# Patient Record
Sex: Male | Born: 1945
Health system: Southern US, Community
[De-identification: ages and names within clinical notes are randomized; demographics above are authoritative.]

## PROBLEM LIST (undated history)

## (undated) DIAGNOSIS — I219 Acute myocardial infarction, unspecified: Secondary | ICD-10-CM

## (undated) DIAGNOSIS — G629 Polyneuropathy, unspecified: Secondary | ICD-10-CM

## (undated) DIAGNOSIS — I10 Essential (primary) hypertension: Secondary | ICD-10-CM

## (undated) DIAGNOSIS — L0390 Cellulitis, unspecified: Secondary | ICD-10-CM

## (undated) DIAGNOSIS — M545 Low back pain, unspecified: Secondary | ICD-10-CM

## (undated) DIAGNOSIS — I2510 Atherosclerotic heart disease of native coronary artery without angina pectoris: Secondary | ICD-10-CM

## (undated) DIAGNOSIS — K219 Gastro-esophageal reflux disease without esophagitis: Secondary | ICD-10-CM

## (undated) DIAGNOSIS — G4733 Obstructive sleep apnea (adult) (pediatric): Secondary | ICD-10-CM

## (undated) DIAGNOSIS — N200 Calculus of kidney: Secondary | ICD-10-CM

## (undated) DIAGNOSIS — F4310 Post-traumatic stress disorder, unspecified: Secondary | ICD-10-CM

## (undated) DIAGNOSIS — M1990 Unspecified osteoarthritis, unspecified site: Secondary | ICD-10-CM

## (undated) DIAGNOSIS — R0600 Dyspnea, unspecified: Secondary | ICD-10-CM

## (undated) DIAGNOSIS — M109 Gout, unspecified: Secondary | ICD-10-CM

## (undated) DIAGNOSIS — Z95810 Presence of automatic (implantable) cardiac defibrillator: Secondary | ICD-10-CM

## (undated) DIAGNOSIS — T8859XA Other complications of anesthesia, initial encounter: Secondary | ICD-10-CM

## (undated) DIAGNOSIS — I509 Heart failure, unspecified: Secondary | ICD-10-CM

## (undated) DIAGNOSIS — C801 Malignant (primary) neoplasm, unspecified: Secondary | ICD-10-CM

## (undated) DIAGNOSIS — M7989 Other specified soft tissue disorders: Secondary | ICD-10-CM

## (undated) DIAGNOSIS — E669 Obesity, unspecified: Secondary | ICD-10-CM

## (undated) HISTORY — PX: PACEMAKER PLACEMENT: SHX43

## (undated) HISTORY — DX: Low back pain: M54.5

## (undated) HISTORY — PX: MULTIPLE TOOTH EXTRACTIONS: SHX2053

## (undated) HISTORY — DX: Calculus of kidney: N20.0

## (undated) HISTORY — DX: Obesity, unspecified: E66.9

## (undated) HISTORY — DX: Polyneuropathy, unspecified: G62.9

## (undated) HISTORY — DX: Heart failure, unspecified: I50.9

## (undated) HISTORY — DX: Low back pain, unspecified: M54.50

## (undated) HISTORY — DX: Malignant (primary) neoplasm, unspecified: C80.1

## (undated) HISTORY — DX: Other specified soft tissue disorders: M79.89

## (undated) HISTORY — DX: Acute myocardial infarction, unspecified: I21.9

## (undated) HISTORY — PX: CARDIAC DEFIBRILLATOR PLACEMENT: SHX171

## (undated) HISTORY — PX: PACEMAKER INSERTION: SHX728

## (undated) HISTORY — DX: Essential (primary) hypertension: I10

---

## 1993-02-16 HISTORY — PX: CORONARY ANGIOPLASTY WITH STENT PLACEMENT: SHX49

## 1993-12-09 DIAGNOSIS — I219 Acute myocardial infarction, unspecified: Secondary | ICD-10-CM

## 1993-12-09 HISTORY — DX: Acute myocardial infarction, unspecified: I21.9

## 2002-02-16 HISTORY — PX: JOINT REPLACEMENT: SHX530

## 2008-05-09 ENCOUNTER — Ambulatory Visit: Payer: Self-pay | Admitting: Diagnostic Radiology

## 2008-05-09 ENCOUNTER — Emergency Department (HOSPITAL_BASED_OUTPATIENT_CLINIC_OR_DEPARTMENT_OTHER): Admission: EM | Admit: 2008-05-09 | Discharge: 2008-05-09 | Payer: Self-pay | Admitting: Emergency Medicine

## 2010-05-29 LAB — COMPREHENSIVE METABOLIC PANEL
ALT: 6 U/L (ref 0–53)
AST: 23 U/L (ref 0–37)
Albumin: 3.8 g/dL (ref 3.5–5.2)
Alkaline Phosphatase: 74 U/L (ref 39–117)
BUN: 7 mg/dL (ref 6–23)
CO2: 30 mEq/L (ref 19–32)
Calcium: 9.2 mg/dL (ref 8.4–10.5)
Chloride: 99 mEq/L (ref 96–112)
Creatinine, Ser: 1.2 mg/dL (ref 0.4–1.5)
GFR calc Af Amer: >60 mL/min (ref >60)
GFR calc non Af Amer: >60 mL/min (ref >60)
Glucose, Bld: 53 mg/dL — ABNORMAL LOW (ref 70–99)
Potassium: 3 mEq/L — ABNORMAL LOW (ref 3.5–5.1)
Sodium: 141 mEq/L (ref 135–145)
Total Bilirubin: 0.7 mg/dL (ref 0.3–1.2)
Total Protein: 7.8 g/dL (ref 6.0–8.3)

## 2010-05-29 LAB — POCT CARDIAC MARKERS
CKMB, poc: 1.3 ng/mL (ref 1.0–8.0)
Myoglobin, poc: 162 ng/mL (ref 12–200)
Troponin i, poc: <0.05 ng/mL (ref 0.00–0.09)

## 2010-05-29 LAB — DIFFERENTIAL
Basophils Absolute: 0.2 10*3/uL — ABNORMAL HIGH (ref 0.0–0.1)
Basophils Relative: 3 % — ABNORMAL HIGH (ref 0–1)
Eosinophils Absolute: 0.1 10*3/uL (ref 0.0–0.7)
Eosinophils Relative: 1 % (ref 0–5)
Lymphocytes Relative: 23 % (ref 12–46)
Lymphs Abs: 1.4 10*3/uL (ref 0.7–4.0)
Monocytes Absolute: 0.7 10*3/uL (ref 0.1–1.0)
Monocytes Relative: 11 % (ref 3–12)
Neutro Abs: 3.7 10*3/uL (ref 1.7–7.7)
Neutrophils Relative %: 61 % (ref 43–77)

## 2010-05-29 LAB — URINALYSIS, ROUTINE W REFLEX MICROSCOPIC
Bilirubin Urine: NEGATIVE
Glucose, UA: NEGATIVE mg/dL
Hgb urine dipstick: NEGATIVE
Ketones, ur: NEGATIVE mg/dL
Nitrite: POSITIVE — AB
Protein, ur: NEGATIVE mg/dL
Specific Gravity, Urine: 1.014 (ref 1.005–1.030)
Urobilinogen, UA: 1 mg/dL (ref 0.0–1.0)
pH: 6.5 (ref 5.0–8.0)

## 2010-05-29 LAB — LIPASE, BLOOD: Lipase: 23 U/L (ref 23–300)

## 2010-05-29 LAB — GLUCOSE, CAPILLARY: Glucose-Capillary: 87 mg/dL (ref 70–99)

## 2010-05-29 LAB — CBC
HCT: 38.2 % — ABNORMAL LOW (ref 39.0–52.0)
Hemoglobin: 12.3 g/dL — ABNORMAL LOW (ref 13.0–17.0)
MCHC: 32.3 g/dL (ref 30.0–36.0)
MCV: 91.7 fL (ref 78.0–100.0)
Platelets: 207 10*3/uL (ref 150–400)
RBC: 4.16 MIL/uL — ABNORMAL LOW (ref 4.22–5.81)
RDW: 15.3 % (ref 11.5–15.5)
WBC: 6.1 10*3/uL (ref 4.0–10.5)

## 2010-05-29 LAB — URINE MICROSCOPIC-ADD ON

## 2010-05-29 LAB — POCT B-TYPE NATRIURETIC PEPTIDE (BNP): B Natriuretic Peptide, POC: 246 pg/mL — ABNORMAL HIGH (ref 0–100)

## 2010-09-16 ENCOUNTER — Encounter (INDEPENDENT_AMBULATORY_CARE_PROVIDER_SITE_OTHER): Payer: Medicare Other

## 2010-09-16 DIAGNOSIS — M79609 Pain in unspecified limb: Secondary | ICD-10-CM

## 2010-10-02 ENCOUNTER — Encounter: Payer: Self-pay | Admitting: Vascular Surgery

## 2010-10-17 ENCOUNTER — Encounter: Payer: Self-pay | Admitting: Vascular Surgery

## 2010-10-24 ENCOUNTER — Ambulatory Visit (INDEPENDENT_AMBULATORY_CARE_PROVIDER_SITE_OTHER): Payer: Medicare Other | Admitting: Vascular Surgery

## 2010-10-24 ENCOUNTER — Encounter: Payer: Self-pay | Admitting: Vascular Surgery

## 2010-10-24 VITALS — BP 109/62 | HR 72 | Ht 68.0 in | Wt 270.0 lb

## 2010-10-24 DIAGNOSIS — E1159 Type 2 diabetes mellitus with other circulatory complications: Secondary | ICD-10-CM

## 2010-10-24 DIAGNOSIS — G629 Polyneuropathy, unspecified: Secondary | ICD-10-CM

## 2010-10-24 DIAGNOSIS — G609 Hereditary and idiopathic neuropathy, unspecified: Secondary | ICD-10-CM

## 2010-10-24 DIAGNOSIS — M79609 Pain in unspecified limb: Secondary | ICD-10-CM

## 2010-10-24 NOTE — Progress Notes (Signed)
[facility]  Referred by: Dr. Norlene Campbell  Reason for referral: Bilateral leg pain  History of Present Illness  Johnny Ochoa is a 65 y.o. male who presents with chief complaint: bilateral leg pain.  Onset of symptom occurred years ago.  Pain is described as aching and cramping in thigh and calves, severity 3-10/10, and associated with movement.  Pain often starts in back/hip and goes down leg.  Patient does ambulate much due to his pain and degeneration in his left knee.   Patient has attempted to treat this pain with chronic pain medications including methadone, vicodin and oxycodone.  The patient has no rest pain symptoms and denies  leg wounds/ulcers.  Atherosclerotic risk factors include: DM, hyperlipidemia, HTN.  Past Medical History  Diagnosis Date  . Kidney stones   . Leg swelling   . Low back pain   . Diabetes mellitus   . Peripheral neuropathy   . Obese   . Hypertension   . Myocardial infarction 12/09/1993  . CHF (congestive heart failure)   . Cancer     prostate  (Radiation only)    Past Surgical History  Procedure Date  . Pacemaker placement   . Joint replacement 2004    right total knee replacement  . Coronary angioplasty with stent placement 1995     x 3 stents  . Multiple tooth extractions     Total Tooth extraction    History   Social History  . Marital Status: Married    Spouse Name: N/A    Number of Children: N/A  . Years of Education: N/A   Occupational History  . Not on file.   Social History Main Topics  . Smoking status: Former Smoker    Types: Cigarettes    Quit date: 10/23/1980  . Smokeless tobacco: Not on file  . Alcohol Use: No  . Drug Use: No  . Sexually Active:    Other Topics Concern  . Not on file   Social History Narrative  . No narrative on file    Family History  Problem Relation Age of Onset  . Diabetes Other   . Hypertension Other   . Heart disease Other   . Arthritis Other      Current outpatient prescriptions:ALBUTEROL IN, Inhale 90 mcg into the lungs as needed. , Disp: , Rfl: ;  citalopram (CELEXA) 10 MG tablet, Take 20 mg by mouth daily. , Disp: , Rfl: ;  Furosemide (LASIX PO), Take 40 mg by mouth daily. , Disp: , Rfl: ;  GABAPENTIN PO, Take 300 mg by mouth 3 (three) times daily. , Disp: , Rfl: ;  HYDROCODONE-ACETAMINOPHEN PO, Take by mouth. 5/500 mg     Take one tablet four times a day as needed for pain, Disp: , Rfl:  insulin NPH-insulin regular (NOVOLIN 70/30) (70-30) 100 UNIT/ML injection, Inject into the skin.  , Disp: , Rfl: ;  LISINOPRIL PO, Take 20 mg by mouth. One half tablet two times a day, Disp: , Rfl: ;  Menthol-Methyl Salicylate 1-15 % CREA, Apply topically.  , Disp: , Rfl: ;  METHADONE HCL PO, Take 10 mg by mouth 4 (four) times daily. , Disp: , Rfl: ;  METOPROLOL TARTRATE PO, Take 25 mg by mouth 2 (two) times daily. , Disp: , Rfl:  Multiple Vitamin (MULTIVITAMIN) tablet, Take 1 tablet by mouth daily.  , Disp: , Rfl: ;  nitroGLYCERIN (NITRODUR - DOSED IN MG/24 HR) 0.2 mg/hr, Place 1 patch onto  the skin daily. Use in conjunction with 0.6 mg/hr patch , Disp: , Rfl: ;  nitroGLYCERIN (NITRODUR - DOSED IN MG/24 HR) 0.6 mg/hr, Place 1 patch onto the skin daily.  , Disp: , Rfl:  nitroGLYCERIN (NITROSTAT) 0.4 MG SL tablet, Place 0.4 mg under the tongue every 5 (five) minutes as needed.  , Disp: , Rfl: ;  OMEPRAZOLE PO, Take 20 mg by mouth 2 (two) times daily. , Disp: , Rfl: ;  OXYBUTYNIN CHLORIDE PO, Take 5 mg by mouth 2 (two) times daily. , Disp: , Rfl: ;  OXYCODONE HCL PO, Take 5 mg by mouth 2 (two) times daily as needed. , Disp: , Rfl: ;  SIMVASTATIN PO, Take 40 mg by mouth at bedtime. , Disp: , Rfl:  terazosin (HYTRIN) 2 MG capsule, Take 2 mg by mouth. Take 2  At bedtime , Disp: , Rfl: ;  TRAZODONE HCL PO, Take by mouth.  , Disp: , Rfl:   Allergies as of 10/24/2010  . (No Known Allergies)    Review of Systems (Positive items in bold and italic, otherwise  negative)  General: Weight loss, Weight gain, Loss of appetite, Fever  Neurologic: Dizziness, Blackouts, Headaches, Seizure  Ear/Nose/Throat: Change in eyesight, Change in hearing, Nose bleeds, Sore throat  Vascular: Pain in legs with walking, Pain in feet while lying flat, Non-healing ulcer, Stroke, "Mini stroke", Slurred speech, Temporary blindness, Blood clot in vein, Phlebitis  Pulmonary: Home oxygen, Productive cough, Bronchitis, Coughing up blood, Asthma, Wheezing  Musculoskeletal: Arthritis, Joint pain, Muscle pain  Cardiac: Chest pain, Chest tightness/pressure, Shortness of breath when lying flat, Shortness of breath with exertion, Palpitations, Heart murmur, Arrythmia, Atrial fibrillation  Hematologic: Bleeding problems, Clotting disorder, Anemia  Psychiatric:  Depression, Anxiety, Attention deficit disorder  Gastrointestinal:  Black stool, Blood in stool, Peptic ulcer disease, Reflux, Hiatal hernia, Trouble swallowing, Diarrhea, Constipation  Urinary:  Kidney disease, Burning with urination, Frequent urination, Difficulty urinating  Skin: Ulcers, Rashes  Physical Examination  Filed Vitals:   10/24/10 1313  BP: 109/62  Pulse: 72    General: A&O x 3, WDWN, morbidly obese  Head: [place]/AT  Ear/Nose/Throat: Hearing grossly intact, nares w/o erythema or drainage, oropharynx w/o Erythema/Exudate  Eyes: PERRLA, EOMI  Neck: Supple, no nuchal rigidity, no palpable LAD  Pulmonary: Sym exp, good air movt, CTAB, no rales, rhonchi, & wheezing  Cardiac: RRR, Nl S1, S2, no Murmurs, rubs or gallops  Vascular: Vessel Right Left  Radial Palpable Palpable  Brachial Palpable Palpable  Carotid Palpable, without bruit Palpable, without bruit  Aorta Non-palpable (morbidly obese) N/A  Femoral Palpable Palpable  Popliteal Non-palpable Non-palpable  PT Palpable Palpable  DP Palpable Palpable   Gastrointestinal: soft, NTND, -G/R, - HSM, - masses, - CVAT B, morbidly  obese  Musculoskeletal: M/S 5/5 throughout , Extremities without ischemic changes   Neurologic: CN 2-12 intact , Pain and light touch intact in extremities , Motor exam as listed above  Psychiatric: Judgment intact, Mood & affect appropriatefor pt's clinical situation  Dermatologic: See M/S exam for extremity exam, no rashes otherwise noted  Lymph : No Cervical, Axillary, or Inguinal lymphadenopathy   Non-Invasive Vascular Imaging  ABI (Date: 09/18/10)  RLE: 1.19, DP & PT: triphasic  LLE: 1.10, DP & PT: triphasic  Outside Studies/Documentation 10 pages of outside documents were reviewed including: clinic notes.  Medical Decision Making  Johnny Ochoa is a 65 y.o. male who presents with: BLE pain unlikely due to PAD from the normal ABI and palpable pedal  pulses  Given this patient's multiple co-morbidities, I did discuss with him he is at high risk for development of PAD though currently, he does not have clinical evident PAD  I discussed in depth with the patient the nature of atherosclerosis, and emphasized the importance of maximal medical management including strict control of blood pressure, blood glucose, and lipid levels, obtaining regular exercise, and cessation of smoking.  The patient is aware that without maximal medical management the underlying atherosclerotic disease process will progress, limiting the benefit of any interventions.  Thank you for allowing [place] to participate in this patient's care.  He will follow up with [place] as needed.  Leonides Sake, MD [facility] Office: [phone] Pager: [phone]

## 2010-12-05 ENCOUNTER — Other Ambulatory Visit: Payer: Self-pay | Admitting: Orthopaedic Surgery

## 2010-12-05 DIAGNOSIS — M479 Spondylosis, unspecified: Secondary | ICD-10-CM

## 2010-12-09 ENCOUNTER — Ambulatory Visit
Admission: RE | Admit: 2010-12-09 | Discharge: 2010-12-09 | Disposition: A | Discharge status: Home / Self Care | Payer: Medicare Other | Source: Ambulatory Visit | Attending: Orthopaedic Surgery | Admitting: Orthopaedic Surgery

## 2010-12-09 DIAGNOSIS — M479 Spondylosis, unspecified: Secondary | ICD-10-CM

## 2011-02-19 DIAGNOSIS — M48061 Spinal stenosis, lumbar region without neurogenic claudication: Secondary | ICD-10-CM | POA: Diagnosis not present

## 2011-02-19 DIAGNOSIS — M47817 Spondylosis without myelopathy or radiculopathy, lumbosacral region: Secondary | ICD-10-CM | POA: Diagnosis not present

## 2011-02-19 DIAGNOSIS — IMO0002 Reserved for concepts with insufficient information to code with codable children: Secondary | ICD-10-CM | POA: Diagnosis not present

## 2011-02-24 DIAGNOSIS — Z95810 Presence of automatic (implantable) cardiac defibrillator: Secondary | ICD-10-CM | POA: Diagnosis not present

## 2011-03-24 DIAGNOSIS — G4733 Obstructive sleep apnea (adult) (pediatric): Secondary | ICD-10-CM | POA: Diagnosis not present

## 2011-03-24 DIAGNOSIS — I2510 Atherosclerotic heart disease of native coronary artery without angina pectoris: Secondary | ICD-10-CM | POA: Diagnosis not present

## 2011-03-24 DIAGNOSIS — I2589 Other forms of chronic ischemic heart disease: Secondary | ICD-10-CM | POA: Diagnosis not present

## 2011-03-24 DIAGNOSIS — R079 Chest pain, unspecified: Secondary | ICD-10-CM | POA: Diagnosis not present

## 2011-06-12 DIAGNOSIS — I498 Other specified cardiac arrhythmias: Secondary | ICD-10-CM | POA: Diagnosis not present

## 2011-09-15 DIAGNOSIS — I498 Other specified cardiac arrhythmias: Secondary | ICD-10-CM | POA: Diagnosis not present

## 2011-12-17 DIAGNOSIS — I498 Other specified cardiac arrhythmias: Secondary | ICD-10-CM | POA: Diagnosis not present

## 2012-01-05 DIAGNOSIS — E119 Type 2 diabetes mellitus without complications: Secondary | ICD-10-CM | POA: Diagnosis not present

## 2012-01-05 DIAGNOSIS — I2510 Atherosclerotic heart disease of native coronary artery without angina pectoris: Secondary | ICD-10-CM | POA: Diagnosis not present

## 2012-01-05 DIAGNOSIS — G4733 Obstructive sleep apnea (adult) (pediatric): Secondary | ICD-10-CM | POA: Diagnosis not present

## 2012-01-05 DIAGNOSIS — R079 Chest pain, unspecified: Secondary | ICD-10-CM | POA: Diagnosis not present

## 2012-04-26 DIAGNOSIS — I2510 Atherosclerotic heart disease of native coronary artery without angina pectoris: Secondary | ICD-10-CM | POA: Diagnosis not present

## 2012-04-26 DIAGNOSIS — R61 Generalized hyperhidrosis: Secondary | ICD-10-CM | POA: Diagnosis not present

## 2012-04-26 DIAGNOSIS — Z95810 Presence of automatic (implantable) cardiac defibrillator: Secondary | ICD-10-CM | POA: Diagnosis not present

## 2012-04-26 DIAGNOSIS — M7710 Lateral epicondylitis, unspecified elbow: Secondary | ICD-10-CM | POA: Diagnosis not present

## 2012-06-02 DIAGNOSIS — I428 Other cardiomyopathies: Secondary | ICD-10-CM | POA: Diagnosis not present

## 2012-06-02 DIAGNOSIS — R42 Dizziness and giddiness: Secondary | ICD-10-CM | POA: Diagnosis not present

## 2012-06-02 DIAGNOSIS — Z95810 Presence of automatic (implantable) cardiac defibrillator: Secondary | ICD-10-CM | POA: Diagnosis not present

## 2012-06-02 DIAGNOSIS — R61 Generalized hyperhidrosis: Secondary | ICD-10-CM | POA: Diagnosis not present

## 2012-06-02 DIAGNOSIS — Z794 Long term (current) use of insulin: Secondary | ICD-10-CM | POA: Diagnosis not present

## 2012-06-02 DIAGNOSIS — E119 Type 2 diabetes mellitus without complications: Secondary | ICD-10-CM | POA: Diagnosis not present

## 2012-07-28 DIAGNOSIS — I499 Cardiac arrhythmia, unspecified: Secondary | ICD-10-CM | POA: Diagnosis not present

## 2012-11-16 DIAGNOSIS — Z950 Presence of cardiac pacemaker: Secondary | ICD-10-CM | POA: Diagnosis not present

## 2012-11-16 DIAGNOSIS — I428 Other cardiomyopathies: Secondary | ICD-10-CM | POA: Diagnosis not present

## 2013-02-21 DIAGNOSIS — I498 Other specified cardiac arrhythmias: Secondary | ICD-10-CM | POA: Diagnosis not present

## 2013-03-10 DIAGNOSIS — G4733 Obstructive sleep apnea (adult) (pediatric): Secondary | ICD-10-CM | POA: Diagnosis not present

## 2013-03-10 DIAGNOSIS — Z95810 Presence of automatic (implantable) cardiac defibrillator: Secondary | ICD-10-CM | POA: Diagnosis not present

## 2013-03-10 DIAGNOSIS — I2510 Atherosclerotic heart disease of native coronary artery without angina pectoris: Secondary | ICD-10-CM | POA: Diagnosis not present

## 2013-03-10 DIAGNOSIS — R0609 Other forms of dyspnea: Secondary | ICD-10-CM | POA: Diagnosis not present

## 2013-03-10 DIAGNOSIS — I10 Essential (primary) hypertension: Secondary | ICD-10-CM | POA: Diagnosis not present

## 2013-03-10 DIAGNOSIS — R0989 Other specified symptoms and signs involving the circulatory and respiratory systems: Secondary | ICD-10-CM | POA: Diagnosis not present

## 2013-03-10 DIAGNOSIS — I428 Other cardiomyopathies: Secondary | ICD-10-CM | POA: Diagnosis not present

## 2013-03-10 DIAGNOSIS — R079 Chest pain, unspecified: Secondary | ICD-10-CM | POA: Diagnosis not present

## 2013-05-24 DIAGNOSIS — Z95810 Presence of automatic (implantable) cardiac defibrillator: Secondary | ICD-10-CM | POA: Diagnosis not present

## 2013-05-24 DIAGNOSIS — R55 Syncope and collapse: Secondary | ICD-10-CM | POA: Diagnosis not present

## 2013-05-24 DIAGNOSIS — I428 Other cardiomyopathies: Secondary | ICD-10-CM | POA: Diagnosis not present

## 2013-08-24 ENCOUNTER — Emergency Department (HOSPITAL_BASED_OUTPATIENT_CLINIC_OR_DEPARTMENT_OTHER): Payer: Medicare Other

## 2013-08-24 ENCOUNTER — Encounter (HOSPITAL_BASED_OUTPATIENT_CLINIC_OR_DEPARTMENT_OTHER): Payer: Self-pay | Admitting: Emergency Medicine

## 2013-08-24 ENCOUNTER — Emergency Department (HOSPITAL_BASED_OUTPATIENT_CLINIC_OR_DEPARTMENT_OTHER)
Admission: EM | Admit: 2013-08-24 | Discharge: 2013-08-24 | Disposition: A | Discharge status: Home / Self Care | Payer: Medicare Other | Attending: Emergency Medicine | Admitting: Emergency Medicine

## 2013-08-24 DIAGNOSIS — Z87891 Personal history of nicotine dependence: Secondary | ICD-10-CM | POA: Insufficient documentation

## 2013-08-24 DIAGNOSIS — J209 Acute bronchitis, unspecified: Secondary | ICD-10-CM | POA: Insufficient documentation

## 2013-08-24 DIAGNOSIS — J029 Acute pharyngitis, unspecified: Secondary | ICD-10-CM | POA: Diagnosis not present

## 2013-08-24 DIAGNOSIS — Z792 Long term (current) use of antibiotics: Secondary | ICD-10-CM | POA: Insufficient documentation

## 2013-08-24 DIAGNOSIS — Z87442 Personal history of urinary calculi: Secondary | ICD-10-CM | POA: Diagnosis not present

## 2013-08-24 DIAGNOSIS — I509 Heart failure, unspecified: Secondary | ICD-10-CM | POA: Insufficient documentation

## 2013-08-24 DIAGNOSIS — I252 Old myocardial infarction: Secondary | ICD-10-CM | POA: Diagnosis not present

## 2013-08-24 DIAGNOSIS — Z794 Long term (current) use of insulin: Secondary | ICD-10-CM | POA: Insufficient documentation

## 2013-08-24 DIAGNOSIS — I498 Other specified cardiac arrhythmias: Secondary | ICD-10-CM | POA: Diagnosis not present

## 2013-08-24 DIAGNOSIS — E119 Type 2 diabetes mellitus without complications: Secondary | ICD-10-CM | POA: Insufficient documentation

## 2013-08-24 DIAGNOSIS — I10 Essential (primary) hypertension: Secondary | ICD-10-CM | POA: Diagnosis not present

## 2013-08-24 DIAGNOSIS — R05 Cough: Secondary | ICD-10-CM | POA: Diagnosis not present

## 2013-08-24 DIAGNOSIS — R0602 Shortness of breath: Secondary | ICD-10-CM | POA: Diagnosis not present

## 2013-08-24 DIAGNOSIS — E669 Obesity, unspecified: Secondary | ICD-10-CM | POA: Insufficient documentation

## 2013-08-24 DIAGNOSIS — R059 Cough, unspecified: Secondary | ICD-10-CM | POA: Diagnosis not present

## 2013-08-24 DIAGNOSIS — Z8546 Personal history of malignant neoplasm of prostate: Secondary | ICD-10-CM | POA: Diagnosis not present

## 2013-08-24 DIAGNOSIS — G608 Other hereditary and idiopathic neuropathies: Secondary | ICD-10-CM | POA: Diagnosis not present

## 2013-08-24 DIAGNOSIS — J40 Bronchitis, not specified as acute or chronic: Secondary | ICD-10-CM

## 2013-08-24 DIAGNOSIS — Z79899 Other long term (current) drug therapy: Secondary | ICD-10-CM | POA: Insufficient documentation

## 2013-08-24 LAB — CBG MONITORING, ED: Glucose-Capillary: 183 mg/dL — ABNORMAL HIGH (ref 70–99)

## 2013-08-24 MED ORDER — BENZONATATE 100 MG PO CAPS: 100.0000 mg | ORAL_CAPSULE | Freq: Three times a day (TID) | ORAL | Status: DC

## 2013-08-24 MED ORDER — ALBUTEROL SULFATE (2.5 MG/3ML) 0.083% IN NEBU
2.5000 mg | INHALATION_SOLUTION | Freq: Once | RESPIRATORY_TRACT | Status: AC
  Administered 2013-08-24: 2.5 mg via RESPIRATORY_TRACT
  Filled 2013-08-24: qty 3

## 2013-08-24 MED ORDER — AMOXICILLIN-POT CLAVULANATE 875-125 MG PO TABS: 1.0000 | ORAL_TABLET | Freq: Two times a day (BID) | ORAL | Status: DC

## 2013-08-24 MED ORDER — ALBUTEROL SULFATE HFA 108 (90 BASE) MCG/ACT IN AERS: 2.0000 | INHALATION_SPRAY | RESPIRATORY_TRACT | Status: DC | PRN

## 2013-08-24 NOTE — ED Notes (Signed)
Patient transported to X-ray 

## 2013-08-24 NOTE — ED Notes (Signed)
C/o cold symptoms x 1 week. No energy, sorethroat, raspy, coughing up dark gray sputum.

## 2013-08-24 NOTE — Discharge Instructions (Signed)
Bronchitis  Bronchitis is inflammation of the airways that extend from the windpipe into the lungs (bronchi). The inflammation often causes mucus to develop, which leads to a cough. If the inflammation becomes severe, it may cause shortness of breath.  CAUSES   Bronchitis may be caused by:    Viral infections.    Bacteria.    Cigarette smoke.    Allergens, pollutants, and other irritants.   SIGNS AND SYMPTOMS   The most common symptom of bronchitis is a frequent cough that produces mucus. Other symptoms include:   Fever.    Body aches.    Chest congestion.    Chills.    Shortness of breath.    Sore throat.   DIAGNOSIS   Bronchitis is usually diagnosed through a medical history and physical exam. Tests, such as chest X-rays, are sometimes done to rule out other conditions.   TREATMENT   You may need to avoid contact with whatever caused the problem (smoking, for example). Medicines are sometimes needed. These may include:   Antibiotics. These may be prescribed if the condition is caused by bacteria.   Cough suppressants. These may be prescribed for relief of cough symptoms.    Inhaled medicines. These may be prescribed to help open your airways and make it easier for you to breathe.    Steroid medicines. These may be prescribed for those with recurrent (chronic) bronchitis.  HOME CARE INSTRUCTIONS   Get plenty of rest.    Drink enough fluids to keep your urine clear or pale yellow (unless you have a medical condition that requires fluid restriction). Increasing fluids may help thin your secretions and will prevent dehydration.    Only take over-the-counter or prescription medicines as directed by your health care provider.   Only take antibiotics as directed. Make sure you finish them even if you start to feel better.   Avoid secondhand smoke, irritating chemicals, and strong fumes. These will make bronchitis worse. If you are a smoker, quit smoking. Consider using nicotine gum or  skin patches to help control withdrawal symptoms. Quitting smoking will help your lungs heal faster.    Put a cool-mist humidifier in your bedroom at night to moisten the air. This may help loosen mucus. Change the water in the humidifier daily. You can also run the hot water in your shower and sit in the bathroom with the door closed for 5-10 minutes.    Follow up with your health care provider as directed.    Wash your hands frequently to avoid catching bronchitis again or spreading an infection to others.   SEEK MEDICAL CARE IF:  Your symptoms do not improve after 1 week of treatment.   SEEK IMMEDIATE MEDICAL CARE IF:   Your fever increases.   You have chills.    You have chest pain.    You have worsening shortness of breath.    You have bloody sputum.   You faint.   You have lightheadedness.   You have a severe headache.    You vomit repeatedly.  MAKE SURE YOU:    Understand these instructions.   Will watch your condition.   Will get help right away if you are not doing well or get worse.  Document Released: 02/02/2005 Document Revised: 11/23/2012 Document Reviewed: 09/27/2012  Exit[employer] Patient Information 2015 [employer]. This information is not intended to replace advice given to you by your health care provider. Make sure you discuss any questions you have with your health care   provider.

## 2013-08-24 NOTE — ED Notes (Signed)
MD at bedside. 

## 2013-08-24 NOTE — ED Provider Notes (Signed)
CSN: [id number]     Arrival date & time 08/24/13  1034 History   First MD Initiated Contact with Patient 08/24/13 1052     Chief Complaint  Patient presents with  . URI     (Consider location/radiation/quality/duration/timing/severity/associated sxs/prior Treatment) HPI Comments: Patient presents to the ER for evaluation of cold symptoms. Patient has been sick for more than a week. He reports sinus congestion, sore throat, hoarse voice, cough. Cough has been productive of dark sputum. He feels mildly short of breath.  Patient is a 68 y.o. male presenting with URI.  URI Presenting symptoms: congestion, cough and sore throat     Past Medical History  Diagnosis Date  . Kidney stones   . Leg swelling   . Low back pain   . Diabetes mellitus   . Peripheral neuropathy   . Obese   . Hypertension   . Myocardial infarction 12/09/1993  . CHF (congestive heart failure)   . Cancer     prostate  (Radiation only)   Past Surgical History  Procedure Laterality Date  . Pacemaker placement    . Joint replacement  2004    right total knee replacement  . Coronary angioplasty with stent placement  1995     x 3 stents  . Multiple tooth extractions      Total Tooth extraction   Family History  Problem Relation Age of Onset  . Diabetes Other   . Hypertension Other   . Heart disease Other   . Arthritis Other    History  Substance Use Topics  . Smoking status: Former Smoker    Types: Cigarettes    Quit date: 10/23/1980  . Smokeless tobacco: Not on file  . Alcohol Use: No    Review of Systems  HENT: Positive for congestion and sore throat.   Respiratory: Positive for cough and shortness of breath.   All other systems reviewed and are negative.     Allergies  Review of patient's allergies indicates no known allergies.  Home Medications   Prior to Admission medications   Medication Sig Start Date End Date Taking? Authorizing Provider  albuterol (PROVENTIL HFA;VENTOLIN HFA)  108 (90 BASE) MCG/ACT inhaler Inhale 2 puffs into the lungs every 4 (four) hours as needed for wheezing or shortness of breath. 08/24/13   Orpah Greek, MD  ALBUTEROL IN Inhale 90 mcg into the lungs as needed.     Historical Provider, MD  amoxicillin-clavulanate (AUGMENTIN) 875-125 MG per tablet Take 1 tablet by mouth 2 (two) times daily. 08/24/13   Orpah Greek, MD  benzonatate (TESSALON) 100 MG capsule Take 1 capsule (100 mg total) by mouth every 8 (eight) hours. 08/24/13   Orpah Greek, MD  citalopram (CELEXA) 10 MG tablet Take 20 mg by mouth daily.     Historical Provider, MD  Furosemide (LASIX PO) Take 40 mg by mouth daily.     Historical Provider, MD  GABAPENTIN PO Take 300 mg by mouth 3 (three) times daily.     Historical Provider, MD  HYDROCODONE-ACETAMINOPHEN PO Take by mouth. 5/500 mg     Take one tablet four times a day as needed for pain    Historical Provider, MD  insulin NPH-insulin regular (NOVOLIN 70/30) (70-30) 100 UNIT/ML injection Inject into the skin.      Historical Provider, MD  LISINOPRIL PO Take 20 mg by mouth. One half tablet two times a day    Historical Provider, MD  Menthol-Methyl Salicylate 4-09 % CREA  Apply topically.      Historical Provider, MD  METHADONE HCL PO Take 10 mg by mouth 4 (four) times daily.     Historical Provider, MD  METOPROLOL TARTRATE PO Take 25 mg by mouth 2 (two) times daily.     Historical Provider, MD  Multiple Vitamin (MULTIVITAMIN) tablet Take 1 tablet by mouth daily.      Historical Provider, MD  nitroGLYCERIN (NITRODUR - DOSED IN MG/24 HR) 0.2 mg/hr Place 1 patch onto the skin daily. Use in conjunction with 0.6 mg/hr patch     Historical Provider, MD  nitroGLYCERIN (NITRODUR - DOSED IN MG/24 HR) 0.6 mg/hr Place 1 patch onto the skin daily.      Historical Provider, MD  nitroGLYCERIN (NITROSTAT) 0.4 MG SL tablet Place 0.4 mg under the tongue every 5 (five) minutes as needed.      Historical Provider, MD  OMEPRAZOLE PO  Take 20 mg by mouth 2 (two) times daily.     Historical Provider, MD  OXYBUTYNIN CHLORIDE PO Take 5 mg by mouth 2 (two) times daily.     Historical Provider, MD  OXYCODONE HCL PO Take 5 mg by mouth 2 (two) times daily as needed.     Historical Provider, MD  SIMVASTATIN PO Take 40 mg by mouth at bedtime.     Historical Provider, MD  terazosin (HYTRIN) 2 MG capsule Take 2 mg by mouth. Take 2  At bedtime     Historical Provider, MD  TRAZODONE HCL PO Take by mouth.      Historical Provider, MD   BP 180/86  Pulse 82  Temp(Src) 98.4 F (36.9 C) (Oral)  Resp 22  Ht 5' 8.5" (1.74 m)  Wt 278 lb (126.1 kg)  BMI 41.65 kg/m2  SpO2 98% Physical Exam  Constitutional: He is oriented to person, place, and time. He appears well-developed and well-nourished. No distress.  HENT:  Head: Normocephalic and atraumatic.  Right Ear: Hearing normal.  Left Ear: Hearing normal.  Nose: Nose normal.  Mouth/Throat: Oropharynx is clear and moist and mucous membranes are normal.  Eyes: Conjunctivae and EOM are normal. Pupils are equal, round, and reactive to light.  Neck: Normal range of motion. Neck supple.  Cardiovascular: Regular rhythm, S1 normal and S2 normal.  Exam reveals no gallop and no friction rub.   No murmur heard. Pulmonary/Chest: Effort normal. No respiratory distress. He has decreased breath sounds. He has wheezes. He exhibits no tenderness.  Abdominal: Soft. Normal appearance and bowel sounds are normal. There is no hepatosplenomegaly. There is no tenderness. There is no rebound, no guarding, no tenderness at McBurney's point and negative Murphy's sign. No hernia.  Musculoskeletal: Normal range of motion.  Neurological: He is alert and oriented to person, place, and time. He has normal strength. No cranial nerve deficit or sensory deficit. Coordination normal. GCS eye subscore is 4. GCS verbal subscore is 5. GCS motor subscore is 6.  Skin: Skin is warm, dry and intact. No rash noted. No cyanosis.   Psychiatric: He has a normal mood and affect. His speech is normal and behavior is normal. Thought content normal.    ED Course  Procedures (including critical care time) Labs Review Labs Reviewed  CBG MONITORING, ED - Abnormal; Notable for the following:    Glucose-Capillary 183 (*)    All other components within normal limits    Imaging Review Dg Chest 2 View  08/24/2013   CLINICAL DATA:  Cough and shortness of breath.  EXAM: CHEST  2 VIEW  COMPARISON:  05/09/2008.  FINDINGS: The lungs are clear without focal infiltrate, edema, pneumothorax or pleural effusion. There is pulmonary vascular congestion without overt pulmonary edema. The cardio pericardial silhouette is enlarged. Left-sided pacer/ AICD is new in the interval. Imaged bony structures of the thorax are intact.  IMPRESSION: Mild vascular congestion.  No edema or focal airspace consolidation.   Electronically Signed   By: Misty Stanley M.D.   On: 08/24/2013 11:39     EKG Interpretation None      MDM   Final diagnoses:  Bronchitis    Patient presented to the ER for evaluation of upper respiratory infection symptoms. He did have slight wheezing upon arrival. This cleared with nebulizer treatment. Patient has used an inhaler in the past, but it is old and he has not talked to use it. He was instructed he needs to use the inhaler as well as the prescribed medications for acute bronchitis. Chest x-ray did not show any pneumonia. His oxygen saturation is 90% on room air, no need for admission for any further workup.    Orpah Greek, MD 08/24/13 1258

## 2013-09-26 ENCOUNTER — Emergency Department (HOSPITAL_BASED_OUTPATIENT_CLINIC_OR_DEPARTMENT_OTHER)
Admission: EM | Admit: 2013-09-26 | Discharge: 2013-09-27 | Disposition: A | Discharge status: Home / Self Care | Payer: Medicare Other | Attending: Emergency Medicine | Admitting: Emergency Medicine

## 2013-09-26 ENCOUNTER — Emergency Department (HOSPITAL_BASED_OUTPATIENT_CLINIC_OR_DEPARTMENT_OTHER): Payer: Medicare Other

## 2013-09-26 ENCOUNTER — Encounter (HOSPITAL_BASED_OUTPATIENT_CLINIC_OR_DEPARTMENT_OTHER): Payer: Self-pay | Admitting: Emergency Medicine

## 2013-09-26 DIAGNOSIS — I509 Heart failure, unspecified: Secondary | ICD-10-CM | POA: Insufficient documentation

## 2013-09-26 DIAGNOSIS — M79609 Pain in unspecified limb: Secondary | ICD-10-CM | POA: Insufficient documentation

## 2013-09-26 DIAGNOSIS — L03119 Cellulitis of unspecified part of limb: Secondary | ICD-10-CM | POA: Insufficient documentation

## 2013-09-26 DIAGNOSIS — IMO0002 Reserved for concepts with insufficient information to code with codable children: Secondary | ICD-10-CM

## 2013-09-26 DIAGNOSIS — I10 Essential (primary) hypertension: Secondary | ICD-10-CM | POA: Diagnosis not present

## 2013-09-26 DIAGNOSIS — Z87442 Personal history of urinary calculi: Secondary | ICD-10-CM | POA: Diagnosis not present

## 2013-09-26 DIAGNOSIS — Z792 Long term (current) use of antibiotics: Secondary | ICD-10-CM | POA: Insufficient documentation

## 2013-09-26 DIAGNOSIS — Z79899 Other long term (current) drug therapy: Secondary | ICD-10-CM | POA: Diagnosis not present

## 2013-09-26 DIAGNOSIS — Z950 Presence of cardiac pacemaker: Secondary | ICD-10-CM | POA: Diagnosis not present

## 2013-09-26 DIAGNOSIS — Z87891 Personal history of nicotine dependence: Secondary | ICD-10-CM | POA: Diagnosis not present

## 2013-09-26 DIAGNOSIS — Z96659 Presence of unspecified artificial knee joint: Secondary | ICD-10-CM | POA: Insufficient documentation

## 2013-09-26 DIAGNOSIS — I252 Old myocardial infarction: Secondary | ICD-10-CM | POA: Diagnosis not present

## 2013-09-26 DIAGNOSIS — E669 Obesity, unspecified: Secondary | ICD-10-CM | POA: Diagnosis not present

## 2013-09-26 DIAGNOSIS — Z794 Long term (current) use of insulin: Secondary | ICD-10-CM | POA: Diagnosis not present

## 2013-09-26 DIAGNOSIS — L02419 Cutaneous abscess of limb, unspecified: Secondary | ICD-10-CM | POA: Insufficient documentation

## 2013-09-26 DIAGNOSIS — G609 Hereditary and idiopathic neuropathy, unspecified: Secondary | ICD-10-CM | POA: Diagnosis not present

## 2013-09-26 DIAGNOSIS — Z923 Personal history of irradiation: Secondary | ICD-10-CM | POA: Diagnosis not present

## 2013-09-26 DIAGNOSIS — M1710 Unilateral primary osteoarthritis, unspecified knee: Secondary | ICD-10-CM | POA: Insufficient documentation

## 2013-09-26 DIAGNOSIS — M1712 Unilateral primary osteoarthritis, left knee: Secondary | ICD-10-CM

## 2013-09-26 DIAGNOSIS — E119 Type 2 diabetes mellitus without complications: Secondary | ICD-10-CM | POA: Diagnosis not present

## 2013-09-26 DIAGNOSIS — Z8546 Personal history of malignant neoplasm of prostate: Secondary | ICD-10-CM | POA: Insufficient documentation

## 2013-09-26 DIAGNOSIS — Z9861 Coronary angioplasty status: Secondary | ICD-10-CM | POA: Insufficient documentation

## 2013-09-26 DIAGNOSIS — L03115 Cellulitis of right lower limb: Secondary | ICD-10-CM

## 2013-09-26 LAB — CBC WITH DIFFERENTIAL/PLATELET
Basophils Absolute: 0 10*3/uL (ref 0.0–0.1)
Basophils Relative: 0 % (ref 0–1)
Eosinophils Absolute: 0.4 10*3/uL (ref 0.0–0.7)
Eosinophils Relative: 4 % (ref 0–5)
HCT: 31.8 % — ABNORMAL LOW (ref 39.0–52.0)
Hemoglobin: 10.8 g/dL — ABNORMAL LOW (ref 13.0–17.0)
Lymphocytes Relative: 21 % (ref 12–46)
Lymphs Abs: 2.1 10*3/uL (ref 0.7–4.0)
MCH: 31.5 pg (ref 26.0–34.0)
MCHC: 34 g/dL (ref 30.0–36.0)
MCV: 92.7 fL (ref 78.0–100.0)
Monocytes Absolute: 1 10*3/uL (ref 0.1–1.0)
Monocytes Relative: 10 % (ref 3–12)
Neutro Abs: 6.4 10*3/uL (ref 1.7–7.7)
Neutrophils Relative %: 64 % (ref 43–77)
Platelets: 250 10*3/uL (ref 150–400)
RBC: 3.43 MIL/uL — ABNORMAL LOW (ref 4.22–5.81)
RDW: 18.5 % — ABNORMAL HIGH (ref 11.5–15.5)
WBC: 9.9 10*3/uL (ref 4.0–10.5)

## 2013-09-26 LAB — COMPREHENSIVE METABOLIC PANEL
ALT: 10 U/L (ref 0–53)
AST: 16 U/L (ref 0–37)
Albumin: 3.3 g/dL — ABNORMAL LOW (ref 3.5–5.2)
Alkaline Phosphatase: 83 U/L (ref 39–117)
Anion gap: 12 (ref 5–15)
BUN: 12 mg/dL (ref 6–23)
CO2: 28 mEq/L (ref 19–32)
Calcium: 9.7 mg/dL (ref 8.4–10.5)
Chloride: 98 mEq/L (ref 96–112)
Creatinine, Ser: 1 mg/dL (ref 0.50–1.35)
GFR calc Af Amer: 87 mL/min — ABNORMAL LOW (ref >90)
GFR calc non Af Amer: 75 mL/min — ABNORMAL LOW (ref >90)
Glucose, Bld: 243 mg/dL — ABNORMAL HIGH (ref 70–99)
Potassium: 4.2 mEq/L (ref 3.7–5.3)
Sodium: 138 mEq/L (ref 137–147)
Total Bilirubin: 0.6 mg/dL (ref 0.3–1.2)
Total Protein: 8.6 g/dL — ABNORMAL HIGH (ref 6.0–8.3)

## 2013-09-26 MED ORDER — ONDANSETRON HCL 4 MG/2ML IJ SOLN
4.0000 mg | Freq: Once | INTRAMUSCULAR | Status: DC
  Filled 2013-09-26: qty 2

## 2013-09-26 MED ORDER — ENOXAPARIN SODIUM 150 MG/ML ~~LOC~~ SOLN
1.0000 mg/kg | Freq: Once | SUBCUTANEOUS | Status: AC
  Administered 2013-09-26: 125 mg via SUBCUTANEOUS
  Filled 2013-09-26: qty 1

## 2013-09-26 MED ORDER — HYDROMORPHONE HCL PF 1 MG/ML IJ SOLN
1.0000 mg | Freq: Once | INTRAMUSCULAR | Status: AC
  Administered 2013-09-26: 1 mg via INTRAVENOUS
  Filled 2013-09-26: qty 1

## 2013-09-26 MED ORDER — SULFAMETHOXAZOLE-TRIMETHOPRIM 800-160 MG PO TABS: 1.0000 | ORAL_TABLET | Freq: Two times a day (BID) | ORAL | Status: AC

## 2013-09-26 MED ORDER — HYDROCODONE-ACETAMINOPHEN 5-325 MG PO TABS: 2.0000 | ORAL_TABLET | ORAL | Status: DC | PRN

## 2013-09-26 MED ORDER — DEXTROSE 5 % IV SOLN
1.0000 g | Freq: Once | INTRAVENOUS | Status: AC
  Administered 2013-09-26: 1 g via INTRAVENOUS

## 2013-09-26 MED ORDER — OXYCODONE-ACETAMINOPHEN 5-325 MG PO TABS: 2.0000 | ORAL_TABLET | ORAL | Status: DC | PRN

## 2013-09-26 MED ORDER — ONDANSETRON HCL 4 MG/2ML IJ SOLN
4.0000 mg | Freq: Once | INTRAMUSCULAR | Status: AC
  Administered 2013-09-26: 4 mg via INTRAVENOUS

## 2013-09-26 MED ORDER — CEPHALEXIN 500 MG PO CAPS: 500.0000 mg | ORAL_CAPSULE | Freq: Four times a day (QID) | ORAL | Status: DC

## 2013-09-26 MED ORDER — CEFTRIAXONE SODIUM 1 G IJ SOLR
INTRAMUSCULAR | Status: AC
  Filled 2013-09-26: qty 10

## 2013-09-26 MED ORDER — SULFAMETHOXAZOLE-TRIMETHOPRIM 800-160 MG PO TABS: 1.0000 | ORAL_TABLET | Freq: Two times a day (BID) | ORAL | Status: DC

## 2013-09-26 NOTE — ED Notes (Signed)
Patient transported to X-ray 

## 2013-09-26 NOTE — ED Notes (Signed)
C/o pain to left knee, right ankle and left flank-(right vs left verified) -denies recent injury

## 2013-09-26 NOTE — Discharge Instructions (Signed)
Arthritis, Nonspecific °Arthritis is inflammation of a joint. This usually means pain, redness, warmth or swelling are present. One or more joints may be involved. There are a number of types of arthritis. Your caregiver may not be able to tell what type of arthritis you have right away. °CAUSES  °The most common cause of arthritis is the wear and tear on the joint (osteoarthritis). This causes damage to the cartilage, which can break down over time. The knees, hips, back and neck are most often affected by this type of arthritis. °Other types of arthritis and common causes of joint pain include: °· Sprains and other injuries near the joint. Sometimes minor sprains and injuries cause pain and swelling that develop hours later. °· Rheumatoid arthritis. This affects hands, feet and knees. It usually affects both sides of your body at the same time. It is often associated with chronic ailments, fever, weight loss and general weakness. °· Crystal arthritis. Gout and pseudo gout can cause occasional acute severe pain, redness and swelling in the foot, ankle, or knee. °· Infectious arthritis. Bacteria can get into a joint through a break in overlying skin. This can cause infection of the joint. Bacteria and viruses can also spread through the blood and affect your joints. °· Drug, infectious and allergy reactions. Sometimes joints can become mildly painful and slightly swollen with these types of illnesses. °SYMPTOMS  °· Pain is the main symptom. °· Your joint or joints can also be red, swollen and warm or hot to the touch. °· You may have a fever with certain types of arthritis, or even feel overall ill. °· The joint with arthritis will hurt with movement. Stiffness is present with some types of arthritis. °DIAGNOSIS  °Your caregiver will suspect arthritis based on your description of your symptoms and on your exam. Testing may be needed to find the type of arthritis: °· Blood and sometimes urine tests. °· X-ray tests  and sometimes CT or MRI scans. °· Removal of fluid from the joint (arthrocentesis) is done to check for bacteria, crystals or other causes. Your caregiver (or a specialist) will numb the area over the joint with a local anesthetic, and use a needle to remove joint fluid for examination. This procedure is only minimally uncomfortable. °· Even with these tests, your caregiver may not be able to tell what kind of arthritis you have. Consultation with a specialist (rheumatologist) may be helpful. °TREATMENT  °Your caregiver will discuss with you treatment specific to your type of arthritis. If the specific type cannot be determined, then the following general recommendations may apply. °Treatment of severe joint pain includes: °· Rest. °· Elevation. °· Anti-inflammatory medication (for example, ibuprofen) may be prescribed. Avoiding activities that cause increased pain. °· Only take over-the-counter or prescription medicines for pain and discomfort as recommended by your caregiver. °· Cold packs over an inflamed joint may be used for 10 to 15 minutes every hour. Hot packs sometimes feel better, but do not use overnight. Do not use hot packs if you are diabetic without your caregiver's permission. °· A cortisone shot into arthritic joints may help reduce pain and swelling. °· Any acute arthritis that gets worse over the next 1 to 2 days needs to be looked at to be sure there is no joint infection. °Long-term arthritis treatment involves modifying activities and lifestyle to reduce joint stress jarring. This can include weight loss. Also, exercise is needed to nourish the joint cartilage and remove waste. This helps keep the muscles   around the joint strong. HOME CARE INSTRUCTIONS   Do not take aspirin to relieve pain if gout is suspected. This elevates uric acid levels.  Only take over-the-counter or prescription medicines for pain, discomfort or fever as directed by your caregiver.  Rest the joint as much as  possible.  If your joint is swollen, keep it elevated.  Use crutches if the painful joint is in your leg.  Drinking plenty of fluids may help for certain types of arthritis.  Follow your caregiver's dietary instructions.  Try low-impact exercise such as:  Swimming.  Water aerobics.  Biking.  Walking.  Morning stiffness is often relieved by a warm shower.  Put your joints through regular range-of-motion. SEEK MEDICAL CARE IF:   You do not feel better in 24 hours or are getting worse.  You have side effects to medications, or are not getting better with treatment. SEEK IMMEDIATE MEDICAL CARE IF:   You have a fever.  You develop severe joint pain, swelling or redness.  Many joints are involved and become painful and swollen.  There is severe back pain and/or leg weakness.  You have loss of bowel or bladder control. Document Released: 03/12/2004 Document Revised: 04/27/2011 Document Reviewed: 03/28/2008 [facility] Patient Information 2015 [place], [place]. This information is not intended to replace advice given to you by your health care provider. Make sure you discuss any questions you have with your health care provider. Cellulitis Cellulitis is an infection of the skin and the tissue beneath it. The infected area is usually red and tender. Cellulitis occurs most often in the arms and lower legs.  CAUSES  Cellulitis is caused by bacteria that enter the skin through cracks or cuts in the skin. The most common types of bacteria that cause cellulitis are staphylococci and streptococci. SIGNS AND SYMPTOMS   Redness and warmth.  Swelling.  Tenderness or pain.  Fever. DIAGNOSIS  Your health care provider can usually determine what is wrong based on a physical exam. Blood tests may also be done. TREATMENT  Treatment usually involves taking an antibiotic medicine. HOME CARE INSTRUCTIONS   Take your antibiotic medicine as directed by your health care provider. Finish  the antibiotic even if you start to feel better.  Keep the infected arm or leg elevated to reduce swelling.  Apply a warm cloth to the affected area up to 4 times per day to relieve pain.  Take medicines only as directed by your health care provider.  Keep all follow-up visits as directed by your health care provider. SEEK MEDICAL CARE IF:   You notice red streaks coming from the infected area.  Your red area gets larger or turns dark in color.  Your bone or joint underneath the infected area becomes painful after the skin has healed.  Your infection returns in the same area or another area.  You notice a swollen bump in the infected area.  You develop new symptoms.  You have a fever. SEEK IMMEDIATE MEDICAL CARE IF:   You feel very sleepy.  You develop vomiting or diarrhea.  You have a general ill feeling (malaise) with muscle aches and pains. MAKE SURE YOU:   Understand these instructions.  Will watch your condition.  Will get help right away if you are not doing well or get worse. Document Released: 11/12/2004 Document Revised: 06/19/2013 Document Reviewed: 04/20/2011 [facility] Patient Information 2015 [place], [place]. This information is not intended to replace advice given to you by your health care provider. Make sure you  discuss any questions you have with your health care provider. ° °

## 2013-09-27 ENCOUNTER — Ambulatory Visit (HOSPITAL_BASED_OUTPATIENT_CLINIC_OR_DEPARTMENT_OTHER)
Admit: 2013-09-27 | Discharge: 2013-09-27 | Disposition: A | Discharge status: Home / Self Care | Payer: Medicare Other | Attending: Emergency Medicine | Admitting: Emergency Medicine

## 2013-09-27 ENCOUNTER — Other Ambulatory Visit (HOSPITAL_BASED_OUTPATIENT_CLINIC_OR_DEPARTMENT_OTHER): Payer: Medicare Other

## 2013-09-27 DIAGNOSIS — L539 Erythematous condition, unspecified: Secondary | ICD-10-CM | POA: Diagnosis not present

## 2013-09-27 DIAGNOSIS — M79609 Pain in unspecified limb: Secondary | ICD-10-CM | POA: Diagnosis not present

## 2013-09-27 DIAGNOSIS — R609 Edema, unspecified: Secondary | ICD-10-CM | POA: Insufficient documentation

## 2013-09-27 NOTE — ED Provider Notes (Signed)
Dg Knee 1-2 Views Left  09/26/2013   CLINICAL DATA:  Pain  EXAM: LEFT KNEE - 1-2 VIEW  COMPARISON:  None.  FINDINGS: Frontal and lateral views were obtained. There is no fracture, dislocation, or effusion. There is marked narrowing medially. There is moderately severe narrowing at the patellofemoral joint. There is spurring in all compartments. There is extensive meniscal calcification. No erosive change.  IMPRESSION: Extensive osteoarthritic change. There is meniscal calcification. While meniscal calcification may be seen with osteoarthritis, it also may be seen with calcium pyrophosphate deposition disease which may present clinically as pseudogout. There is no demonstrable fracture or joint effusion.   Electronically Signed   By: Lowella Grip M.D.   On: 09/26/2013 21:38   US Venous Img Lower Unilateral Right  09/27/2013   CLINICAL DATA:  Right lower leg pain, edema and erythema.  EXAM: RIGHT LOWER EXTREMITY VENOUS DOPPLER ULTRASOUND  TECHNIQUE: Gray-scale sonography with graded compression, as well as color Doppler and duplex ultrasound were performed to evaluate the lower extremity deep venous systems from the level of the common femoral vein and including the common femoral, femoral, profunda femoral, popliteal and calf veins including the posterior tibial, peroneal and gastrocnemius veins when visible. The superficial great saphenous vein was also interrogated. Spectral Doppler was utilized to evaluate flow at rest and with distal augmentation maneuvers in the common femoral, femoral and popliteal veins.  COMPARISON:  None.  FINDINGS: Common Femoral Vein: No evidence of thrombus. Normal compressibility, respiratory phasicity and response to augmentation.  Saphenofemoral Junction: No evidence of thrombus. Normal compressibility and flow on color Doppler imaging.  Profunda Femoral Vein: No evidence of thrombus. Normal compressibility and flow on color Doppler imaging.  Femoral Vein: Incidental partially  duplicated femoral vein in the thigh. No evidence of thrombus. Normal compressibility, respiratory phasicity and response to augmentation.  Popliteal Vein: No evidence of thrombus. Normal compressibility, respiratory phasicity and response to augmentation.  Calf Veins: No evidence of thrombus. Normal compressibility and flow on color Doppler imaging.  Superficial Great Saphenous Vein: No evidence of thrombus. Normal compressibility and flow on color Doppler imaging.  Venous Reflux:  None.  Other Findings: No evidence of superficial thrombophlebitis or abnormal fluid collection.  IMPRESSION: No evidence of right lower extremity deep venous thrombosis.   Electronically Signed   By: Aletta Edouard M.D.   On: 09/27/2013 11:43   Pt feeling about the same. No worsening erythema. Encouraged to follow up with pcp. Told to return to ER for new or worsening symptoms  Hoy Morn, MD 09/27/13 1153

## 2013-09-27 NOTE — ED Provider Notes (Signed)
Medical screening examination/treatment/procedure(s) were performed by non-physician practitioner and as supervising physician I was immediately available for consultation/collaboration.   EKG Interpretation None       Threasa Beards, MD 09/27/13 [phone]

## 2013-09-27 NOTE — ED Provider Notes (Signed)
CSN: [id number]     Arrival date & time 09/26/13  1800 History   First MD Initiated Contact with Patient 09/26/13 2046     Chief Complaint  Patient presents with  . Leg Pain     (Consider location/radiation/quality/duration/timing/severity/associated sxs/prior Treatment) Patient is a 68 y.o. male presenting with leg pain. The history is provided by the patient. No language interpreter was used.  Leg Pain Location:  Leg Injury: no   Leg location:  R leg and L leg Pain details:    Quality:  Aching   Severity:  Severe   Timing:  Constant Chronicity:  New Dislocation: no   Prior injury to area:  No Relieved by:  Nothing Worsened by:  Nothing tried Ineffective treatments:  None tried Pt has severe arthritis of left knee and has been told he needs a knee replacement.  Pt reports He can not have due to medical issues and weight.  Pt reports also has pain and redness to right ankle.  Pt reports this is new.   Past Medical History  Diagnosis Date  . Kidney stones   . Leg swelling   . Low back pain   . Diabetes mellitus   . Peripheral neuropathy   . Obese   . Hypertension   . Myocardial infarction 12/09/1993  . CHF (congestive heart failure)   . Cancer     prostate  (Radiation only)   Past Surgical History  Procedure Laterality Date  . Pacemaker placement    . Joint replacement  2004    right total knee replacement  . Coronary angioplasty with stent placement  1995     x 3 stents  . Multiple tooth extractions      Total Tooth extraction   Family History  Problem Relation Age of Onset  . Diabetes Other   . Hypertension Other   . Heart disease Other   . Arthritis Other    History  Substance Use Topics  . Smoking status: Former Smoker    Types: Cigarettes    Quit date: 10/23/1980  . Smokeless tobacco: Not on file  . Alcohol Use: No    Review of Systems  All other systems reviewed and are negative.     Allergies  Review of patient's allergies indicates no  known allergies.  Home Medications   Prior to Admission medications   Medication Sig Start Date End Date Taking? Authorizing Provider  albuterol (PROVENTIL HFA;VENTOLIN HFA) 108 (90 BASE) MCG/ACT inhaler Inhale 2 puffs into the lungs every 4 (four) hours as needed for wheezing or shortness of breath. 08/24/13   Orpah Greek, MD  ALBUTEROL IN Inhale 90 mcg into the lungs as needed.     Historical Provider, MD  amoxicillin-clavulanate (AUGMENTIN) 875-125 MG per tablet Take 1 tablet by mouth 2 (two) times daily. 08/24/13   Orpah Greek, MD  benzonatate (TESSALON) 100 MG capsule Take 1 capsule (100 mg total) by mouth every 8 (eight) hours. 08/24/13   Orpah Greek, MD  cephALEXin (KEFLEX) 500 MG capsule Take 1 capsule (500 mg total) by mouth 4 (four) times daily. 09/26/13   Fransico Meadow, PA-C  cephALEXin (KEFLEX) 500 MG capsule Take 1 capsule (500 mg total) by mouth 4 (four) times daily. 09/26/13   Fransico Meadow, PA-C  citalopram (CELEXA) 10 MG tablet Take 20 mg by mouth daily.     Historical Provider, MD  Furosemide (LASIX PO) Take 40 mg by mouth daily.     Historical  Provider, MD  GABAPENTIN PO Take 300 mg by mouth 3 (three) times daily.     Historical Provider, MD  HYDROcodone-acetaminophen (NORCO/VICODIN) 5-325 MG per tablet Take 2 tablets by mouth every 4 (four) hours as needed. 09/26/13   Fransico Meadow, PA-C  HYDROCODONE-ACETAMINOPHEN PO Take by mouth. 5/500 mg     Take one tablet four times a day as needed for pain    Historical Provider, MD  insulin NPH-insulin regular (NOVOLIN 70/30) (70-30) 100 UNIT/ML injection Inject into the skin.      Historical Provider, MD  LISINOPRIL PO Take 20 mg by mouth. One half tablet two times a day    Historical Provider, MD  Menthol-Methyl Salicylate 4-49 % CREA Apply topically.      Historical Provider, MD  METHADONE HCL PO Take 10 mg by mouth 4 (four) times daily.     Historical Provider, MD  METOPROLOL TARTRATE PO Take 25 mg by  mouth 2 (two) times daily.     Historical Provider, MD  Multiple Vitamin (MULTIVITAMIN) tablet Take 1 tablet by mouth daily.      Historical Provider, MD  nitroGLYCERIN (NITRODUR - DOSED IN MG/24 HR) 0.2 mg/hr Place 1 patch onto the skin daily. Use in conjunction with 0.6 mg/hr patch     Historical Provider, MD  nitroGLYCERIN (NITRODUR - DOSED IN MG/24 HR) 0.6 mg/hr Place 1 patch onto the skin daily.      Historical Provider, MD  nitroGLYCERIN (NITROSTAT) 0.4 MG SL tablet Place 0.4 mg under the tongue every 5 (five) minutes as needed.      Historical Provider, MD  OMEPRAZOLE PO Take 20 mg by mouth 2 (two) times daily.     Historical Provider, MD  OXYBUTYNIN CHLORIDE PO Take 5 mg by mouth 2 (two) times daily.     Historical Provider, MD  OXYCODONE HCL PO Take 5 mg by mouth 2 (two) times daily as needed.     Historical Provider, MD  oxyCODONE-acetaminophen (PERCOCET/ROXICET) 5-325 MG per tablet Take 2 tablets by mouth every 4 (four) hours as needed for severe pain. 09/26/13   Fransico Meadow, PA-C  SIMVASTATIN PO Take 40 mg by mouth at bedtime.     Historical Provider, MD  sulfamethoxazole-trimethoprim (BACTRIM DS,SEPTRA DS) 800-160 MG per tablet Take 1 tablet by mouth 2 (two) times daily. 09/26/13 10/03/13  Fransico Meadow, PA-C  terazosin (HYTRIN) 2 MG capsule Take 2 mg by mouth. Take 2  At bedtime     Historical Provider, MD  TRAZODONE HCL PO Take by mouth.      Historical Provider, MD   BP 139/66  Pulse 71  Temp(Src) 98.6 F (37 C) (Oral)  Resp 16  Ht 5\' 8"  (1.727 m)  Wt 275 lb (124.739 kg)  BMI 41.82 kg/m2  SpO2 96% Physical Exam  Nursing note and vitals reviewed. Constitutional: He is oriented to person, place, and time. He appears well-developed and well-nourished.  HENT:  Head: Normocephalic.  Eyes: EOM are normal. Pupils are equal, round, and reactive to light.  Neck: Normal range of motion.  Cardiovascular: Normal rate.   Pulmonary/Chest: Effort normal.  Abdominal: He exhibits no  distension.  Musculoskeletal: Normal range of motion.  Neurological: He is alert and oriented to person, place, and time.  Skin: There is erythema.  Slight swelling right ankle,  Tender to touch  (positive homans vs skin pain)   Left knee large amount of swelling  Psychiatric: He has a normal mood and affect.  ED Course  Procedures (including critical care time) Labs Review Labs Reviewed  CBC WITH DIFFERENTIAL - Abnormal; Notable for the following:    RBC 3.43 (*)    Hemoglobin 10.8 (*)    HCT 31.8 (*)    RDW 18.5 (*)    All other components within normal limits  COMPREHENSIVE METABOLIC PANEL - Abnormal; Notable for the following:    Glucose, Bld 243 (*)    Total Protein 8.6 (*)    Albumin 3.3 (*)    GFR calc non Af Amer 75 (*)    GFR calc Af Amer 87 (*)    All other components within normal limits    Imaging Review Dg Knee 1-2 Views Left  09/26/2013   CLINICAL DATA:  Pain  EXAM: LEFT KNEE - 1-2 VIEW  COMPARISON:  None.  FINDINGS: Frontal and lateral views were obtained. There is no fracture, dislocation, or effusion. There is marked narrowing medially. There is moderately severe narrowing at the patellofemoral joint. There is spurring in all compartments. There is extensive meniscal calcification. No erosive change.  IMPRESSION: Extensive osteoarthritic change. There is meniscal calcification. While meniscal calcification may be seen with osteoarthritis, it also may be seen with calcium pyrophosphate deposition disease which may present clinically as pseudogout. There is no demonstrable fracture or joint effusion.   Electronically Signed   By: Lowella Grip M.D.   On: 09/26/2013 21:38     EKG Interpretation None      MDM Pt upset about weight, triage process and having to return for ultrasound.  I apologized and pt agreed to stay for treatment.    Final diagnoses:  Cellulitis of right leg  Arthritis of left knee    Pt to return at 10 for ultrasound of lower leg.  Pt  given lovenox here.  Iv rocephin.  Rx for keflex and bactrim.   Pt advised to [facility].  Pt given rx for hydrocodone    Fransico Meadow, PA-C 09/27/13 0009

## 2013-11-30 ENCOUNTER — Emergency Department (HOSPITAL_BASED_OUTPATIENT_CLINIC_OR_DEPARTMENT_OTHER): Payer: Medicare Other

## 2013-11-30 ENCOUNTER — Encounter (HOSPITAL_BASED_OUTPATIENT_CLINIC_OR_DEPARTMENT_OTHER): Payer: Self-pay | Admitting: Emergency Medicine

## 2013-11-30 ENCOUNTER — Inpatient Hospital Stay (HOSPITAL_BASED_OUTPATIENT_CLINIC_OR_DEPARTMENT_OTHER)
Admission: EM | Admit: 2013-11-30 | Discharge: 2013-11-30 | DRG: 313 | Disposition: A | Discharge status: Other Acute Inpatient Hospital | Payer: Medicare Other | Attending: Emergency Medicine | Admitting: Emergency Medicine

## 2013-11-30 DIAGNOSIS — R51 Headache: Secondary | ICD-10-CM | POA: Diagnosis present

## 2013-11-30 DIAGNOSIS — D500 Iron deficiency anemia secondary to blood loss (chronic): Secondary | ICD-10-CM | POA: Diagnosis not present

## 2013-11-30 DIAGNOSIS — K5730 Diverticulosis of large intestine without perforation or abscess without bleeding: Secondary | ICD-10-CM | POA: Diagnosis present

## 2013-11-30 DIAGNOSIS — R531 Weakness: Secondary | ICD-10-CM | POA: Diagnosis not present

## 2013-11-30 DIAGNOSIS — Z7982 Long term (current) use of aspirin: Secondary | ICD-10-CM | POA: Diagnosis not present

## 2013-11-30 DIAGNOSIS — Z95810 Presence of automatic (implantable) cardiac defibrillator: Secondary | ICD-10-CM | POA: Diagnosis not present

## 2013-11-30 DIAGNOSIS — D649 Anemia, unspecified: Secondary | ICD-10-CM | POA: Diagnosis not present

## 2013-11-30 DIAGNOSIS — D509 Iron deficiency anemia, unspecified: Secondary | ICD-10-CM | POA: Diagnosis present

## 2013-11-30 DIAGNOSIS — M1990 Unspecified osteoarthritis, unspecified site: Secondary | ICD-10-CM | POA: Diagnosis present

## 2013-11-30 DIAGNOSIS — Z8249 Family history of ischemic heart disease and other diseases of the circulatory system: Secondary | ICD-10-CM | POA: Diagnosis not present

## 2013-11-30 DIAGNOSIS — R0602 Shortness of breath: Secondary | ICD-10-CM | POA: Diagnosis not present

## 2013-11-30 DIAGNOSIS — K922 Gastrointestinal hemorrhage, unspecified: Secondary | ICD-10-CM | POA: Diagnosis not present

## 2013-11-30 DIAGNOSIS — K921 Melena: Secondary | ICD-10-CM | POA: Diagnosis not present

## 2013-11-30 DIAGNOSIS — K648 Other hemorrhoids: Secondary | ICD-10-CM | POA: Diagnosis not present

## 2013-11-30 DIAGNOSIS — I5022 Chronic systolic (congestive) heart failure: Secondary | ICD-10-CM | POA: Diagnosis not present

## 2013-11-30 DIAGNOSIS — I771 Stricture of artery: Secondary | ICD-10-CM | POA: Diagnosis not present

## 2013-11-30 DIAGNOSIS — R079 Chest pain, unspecified: Secondary | ICD-10-CM | POA: Diagnosis present

## 2013-11-30 DIAGNOSIS — I429 Cardiomyopathy, unspecified: Secondary | ICD-10-CM | POA: Diagnosis not present

## 2013-11-30 DIAGNOSIS — E119 Type 2 diabetes mellitus without complications: Secondary | ICD-10-CM | POA: Diagnosis not present

## 2013-11-30 DIAGNOSIS — I5020 Unspecified systolic (congestive) heart failure: Secondary | ICD-10-CM | POA: Diagnosis present

## 2013-11-30 DIAGNOSIS — I255 Ischemic cardiomyopathy: Secondary | ICD-10-CM | POA: Diagnosis present

## 2013-11-30 DIAGNOSIS — G4733 Obstructive sleep apnea (adult) (pediatric): Secondary | ICD-10-CM | POA: Diagnosis present

## 2013-11-30 DIAGNOSIS — K222 Esophageal obstruction: Secondary | ICD-10-CM | POA: Diagnosis not present

## 2013-11-30 DIAGNOSIS — I361 Nonrheumatic tricuspid (valve) insufficiency: Secondary | ICD-10-CM | POA: Diagnosis not present

## 2013-11-30 DIAGNOSIS — Z6841 Body Mass Index (BMI) 40.0 and over, adult: Secondary | ICD-10-CM | POA: Diagnosis not present

## 2013-11-30 DIAGNOSIS — I517 Cardiomegaly: Secondary | ICD-10-CM | POA: Diagnosis not present

## 2013-11-30 DIAGNOSIS — I10 Essential (primary) hypertension: Secondary | ICD-10-CM | POA: Diagnosis not present

## 2013-11-30 DIAGNOSIS — Z791 Long term (current) use of non-steroidal anti-inflammatories (NSAID): Secondary | ICD-10-CM | POA: Diagnosis not present

## 2013-11-30 DIAGNOSIS — D5919 Other autoimmune hemolytic anemia: Secondary | ICD-10-CM | POA: Insufficient documentation

## 2013-11-30 DIAGNOSIS — I2510 Atherosclerotic heart disease of native coronary artery without angina pectoris: Secondary | ICD-10-CM | POA: Diagnosis present

## 2013-11-30 DIAGNOSIS — K2970 Gastritis, unspecified, without bleeding: Secondary | ICD-10-CM | POA: Diagnosis not present

## 2013-11-30 DIAGNOSIS — G629 Polyneuropathy, unspecified: Secondary | ICD-10-CM | POA: Diagnosis present

## 2013-11-30 DIAGNOSIS — Z87891 Personal history of nicotine dependence: Secondary | ICD-10-CM | POA: Diagnosis not present

## 2013-11-30 DIAGNOSIS — Z833 Family history of diabetes mellitus: Secondary | ICD-10-CM | POA: Diagnosis not present

## 2013-11-30 DIAGNOSIS — Z79899 Other long term (current) drug therapy: Secondary | ICD-10-CM | POA: Diagnosis not present

## 2013-11-30 DIAGNOSIS — N390 Urinary tract infection, site not specified: Secondary | ICD-10-CM | POA: Diagnosis present

## 2013-11-30 DIAGNOSIS — Z8546 Personal history of malignant neoplasm of prostate: Secondary | ICD-10-CM | POA: Diagnosis not present

## 2013-11-30 DIAGNOSIS — I340 Nonrheumatic mitral (valve) insufficiency: Secondary | ICD-10-CM | POA: Diagnosis not present

## 2013-11-30 LAB — BASIC METABOLIC PANEL
Anion gap: 13 (ref 5–15)
BUN: 13 mg/dL (ref 6–23)
CO2: 26 mEq/L (ref 19–32)
Calcium: 9 mg/dL (ref 8.4–10.5)
Chloride: 100 mEq/L (ref 96–112)
Creatinine, Ser: 0.9 mg/dL (ref 0.50–1.35)
GFR calc Af Amer: >90 mL/min (ref >90)
GFR calc non Af Amer: 85 mL/min — ABNORMAL LOW (ref >90)
Glucose, Bld: 180 mg/dL — ABNORMAL HIGH (ref 70–99)
Potassium: 3.6 mEq/L — ABNORMAL LOW (ref 3.7–5.3)
Sodium: 139 mEq/L (ref 137–147)

## 2013-11-30 LAB — CBC
HCT: 19.2 % — ABNORMAL LOW (ref 39.0–52.0)
Hemoglobin: 6.5 g/dL — CL (ref 13.0–17.0)
MCH: 33.9 pg (ref 26.0–34.0)
MCHC: 33.9 g/dL (ref 30.0–36.0)
MCV: 100 fL (ref 78.0–100.0)
Platelets: 323 10*3/uL (ref 150–400)
RBC: 1.92 MIL/uL — ABNORMAL LOW (ref 4.22–5.81)
RDW: 26.4 % — ABNORMAL HIGH (ref 11.5–15.5)
WBC: 13.2 10*3/uL — ABNORMAL HIGH (ref 4.0–10.5)

## 2013-11-30 LAB — TROPONIN I: Troponin I: <0.3 ng/mL (ref <0.30)

## 2013-11-30 LAB — PRO B NATRIURETIC PEPTIDE: Pro B Natriuretic peptide (BNP): 378.2 pg/mL — ABNORMAL HIGH (ref 0–125)

## 2013-11-30 LAB — OCCULT BLOOD X 1 CARD TO LAB, STOOL: Fecal Occult Bld: NEGATIVE

## 2013-11-30 NOTE — Progress Notes (Signed)
PENDING ACCEPTANCE TRANFER NOTE:  Call received from:    Dr. Mingo Amber @ [facility]  REASON FOR REQUESTING TRANSFER:    Chest pain and symptomatic anemia  HPI:   68 year old male presented with occasional chest pain and shortness of breath. Initially worked up for CHF but hemoglobin came back at 6.5 g/dL. Patient denies any recent bleeding. His Hemoccult is negative, hemoglobin was 10.8 on 09/26/2013.   PLAN:  According to telephone report, this patient was accepted for transfer to [facility],   Under [place] team:  [facility],  I have requested an order be written to call Flow Manager at [phone] upon patient arrival to the floor for final physician assignment who will do the admission and give admitting orders.  SIGNED: Birdie Hopes, MD [facility]  11/30/2013, 1:21 PM

## 2013-11-30 NOTE — ED Notes (Signed)
Report called to Elmyra Ricks RN at [facility] pt going to [phone].

## 2013-11-30 NOTE — ED Notes (Signed)
O2 at 2l/m applied.

## 2013-11-30 NOTE — ED Provider Notes (Signed)
CSN: [id number]     Arrival date & time 11/30/13  1104 History   First MD Initiated Contact with Patient 11/30/13 1137     Chief Complaint  Patient presents with  . Headache  . Chest Pain     (Consider location/radiation/quality/duration/timing/severity/associated sxs/prior Treatment) Patient is a 68 y.o. male presenting with headaches and chest pain. The history is provided by the patient.  Headache Pain location:  Frontal Quality:  Dull Radiates to:  Does not radiate Onset quality:  Sudden Duration:  1 day Timing:  Constant Progression:  Unchanged Chronicity:  New Similar to prior headaches: no   Associated symptoms: no abdominal pain, no cough, no fever and no vomiting   Chest Pain Pain location:  L chest Pain quality: aching   Pain quality comment:  Described as "lurking in the back" Pain radiates to:  Does not radiate Pain severity:  Mild Onset quality:  Gradual Timing:  Intermittent Associated symptoms: headache   Associated symptoms: no abdominal pain, no cough, no fever, no shortness of breath and not vomiting     Past Medical History  Diagnosis Date  . Kidney stones   . Leg swelling   . Low back pain   . Diabetes mellitus   . Peripheral neuropathy   . Obese   . Hypertension   . Myocardial infarction 12/09/1993  . CHF (congestive heart failure)   . Cancer     prostate  (Radiation only)   Past Surgical History  Procedure Laterality Date  . Pacemaker placement    . Joint replacement  2004    right total knee replacement  . Coronary angioplasty with stent placement  1995     x 3 stents  . Multiple tooth extractions      Total Tooth extraction   Family History  Problem Relation Age of Onset  . Diabetes Other   . Hypertension Other   . Heart disease Other   . Arthritis Other    History  Substance Use Topics  . Smoking status: Former Smoker    Types: Cigarettes    Quit date: 10/23/1980  . Smokeless tobacco: Not on file  . Alcohol Use: No     Review of Systems  Constitutional: Negative for fever.  Respiratory: Negative for cough and shortness of breath.   Cardiovascular: Positive for chest pain.  Gastrointestinal: Negative for vomiting and abdominal pain.  Neurological: Positive for headaches.  All other systems reviewed and are negative.     Allergies  Review of patient's allergies indicates no known allergies.  Home Medications   Prior to Admission medications   Medication Sig Start Date End Date Taking? Authorizing Provider  albuterol (PROVENTIL HFA;VENTOLIN HFA) 108 (90 BASE) MCG/ACT inhaler Inhale 2 puffs into the lungs every 4 (four) hours as needed for wheezing or shortness of breath. 08/24/13  Yes Orpah Greek, MD  ALBUTEROL IN Inhale 90 mcg into the lungs as needed.    Yes Historical Provider, MD  atorvastatin (LIPITOR) 10 MG tablet Take 10 mg by mouth daily.   Yes Historical Provider, MD  Furosemide (LASIX PO) Take 40 mg by mouth daily.    Yes Historical Provider, MD  GABAPENTIN PO Take 300 mg by mouth 3 (three) times daily.    Yes Historical Provider, MD  insulin NPH-insulin regular (NOVOLIN 70/30) (70-30) 100 UNIT/ML injection Inject into the skin.     Yes Historical Provider, MD  LISINOPRIL PO Take 20 mg by mouth. One half tablet two times a day  Yes Historical Provider, MD  Menthol-Methyl Salicylate 6-27 % CREA Apply topically.     Yes Historical Provider, MD  METHADONE HCL PO Take 10 mg by mouth 4 (four) times daily.    Yes Historical Provider, MD  Multiple Vitamin (MULTIVITAMIN) tablet Take 1 tablet by mouth daily.     Yes Historical Provider, MD  nitroGLYCERIN (NITRODUR - DOSED IN MG/24 HR) 0.2 mg/hr Place 1 patch onto the skin daily. Use in conjunction with 0.6 mg/hr patch    Yes Historical Provider, MD  nitroGLYCERIN (NITRODUR - DOSED IN MG/24 HR) 0.6 mg/hr Place 1 patch onto the skin daily.     Yes Historical Provider, MD  nitroGLYCERIN (NITROSTAT) 0.4 MG SL tablet Place 0.4 mg under the  tongue every 5 (five) minutes as needed.     Yes Historical Provider, MD  OXYCODONE HCL PO Take 5 mg by mouth 2 (two) times daily as needed.    Yes Historical Provider, MD  oxyCODONE-acetaminophen (PERCOCET/ROXICET) 5-325 MG per tablet Take 2 tablets by mouth every 4 (four) hours as needed for severe pain. 09/26/13  Yes Hollace Kinnier Sofia, PA-C  terazosin (HYTRIN) 2 MG capsule Take 2 mg by mouth. Take 2  At bedtime    Yes Historical Provider, MD  TRAZODONE HCL PO Take by mouth.     Yes Historical Provider, MD  amoxicillin-clavulanate (AUGMENTIN) 875-125 MG per tablet Take 1 tablet by mouth 2 (two) times daily. 08/24/13   Orpah Greek, MD  benzonatate (TESSALON) 100 MG capsule Take 1 capsule (100 mg total) by mouth every 8 (eight) hours. 08/24/13   Orpah Greek, MD  cephALEXin (KEFLEX) 500 MG capsule Take 1 capsule (500 mg total) by mouth 4 (four) times daily. 09/26/13   Fransico Meadow, PA-C  cephALEXin (KEFLEX) 500 MG capsule Take 1 capsule (500 mg total) by mouth 4 (four) times daily. 09/26/13   Fransico Meadow, PA-C  citalopram (CELEXA) 10 MG tablet Take 20 mg by mouth daily.     Historical Provider, MD  HYDROcodone-acetaminophen (NORCO/VICODIN) 5-325 MG per tablet Take 2 tablets by mouth every 4 (four) hours as needed. 09/26/13   Fransico Meadow, PA-C  HYDROCODONE-ACETAMINOPHEN PO Take by mouth. 5/500 mg     Take one tablet four times a day as needed for pain    Historical Provider, MD  METOPROLOL TARTRATE PO Take 25 mg by mouth 2 (two) times daily.     Historical Provider, MD  OMEPRAZOLE PO Take 20 mg by mouth 2 (two) times daily.     Historical Provider, MD  OXYBUTYNIN CHLORIDE PO Take 5 mg by mouth 2 (two) times daily.     Historical Provider, MD  SIMVASTATIN PO Take 40 mg by mouth at bedtime.     Historical Provider, MD   BP 141/59  Pulse 78  Temp(Src) 98.4 F (36.9 C) (Oral)  Resp 20  Ht 5' 8.5" (1.74 m)  Wt 273 lb (123.832 kg)  BMI 40.90 kg/m2  SpO2 95% Physical Exam   Nursing note and vitals reviewed. Constitutional: He is oriented to person, place, and time. He appears well-developed and well-nourished. No distress.  HENT:  Head: Normocephalic and atraumatic.  Mouth/Throat: Oropharynx is clear and moist. No oropharyngeal exudate.  Eyes: EOM are normal. Pupils are equal, round, and reactive to light.  Neck: Normal range of motion. Neck supple.  Cardiovascular: Normal rate and regular rhythm.  Exam reveals no friction rub.   No murmur heard. Pulmonary/Chest: Effort normal and breath  sounds normal. No respiratory distress. He has no wheezes. He has no rales.  Abdominal: He exhibits no distension. There is no tenderness. There is no rebound.  Musculoskeletal: Normal range of motion. He exhibits no edema.  Neurological: He is alert and oriented to person, place, and time. He exhibits normal muscle tone.  Skin: Skin is warm. No rash noted. He is not diaphoretic.    ED Course  Procedures (including critical care time) Labs Review Labs Reviewed  CBC  BASIC METABOLIC PANEL  PRO B NATRIURETIC PEPTIDE  TROPONIN I    Imaging Review No results found.   EKG Interpretation   Date/Time:  Thursday November 30 2013 11:11:15 EDT Ventricular Rate:  86 PR Interval:  248 QRS Duration: 100 QT Interval:  382 QTC Calculation: 457 R Axis:   37 Text Interpretation:  Atrial-paced rhythm with prolonged AV conduction  Nonspecific T wave abnormality Abnormal ECG Similar to prior Confirmed by  Mingo Amber  MD, [place] ([id number]) on 11/30/2013 11:16:58 AM      MDM   Final diagnoses:  Anemia, unspecified anemia type  Weakness    53M presents with sluggishness. Present for 1 week. Reports DOE and some L sided "lurking in the back" chest pain that doesn't radiate. Hx of CHF, states this feels like prior CHF. Also reports mild frontal throbbing headache, started suddenly yesterday. Patient alert, oriented, but listless. Lungs clear, no JVD, no peripheral edema. Will check  labs, head CT. Normal strength and sensation in all extremities. Hgb critically low. Hemoccult negative.  Will admit to [facility] system.  Evelina Bucy, MD 11/30/13 [phone]

## 2013-11-30 NOTE — ED Notes (Signed)
Patient transported to CT 

## 2013-11-30 NOTE — ED Notes (Signed)
C/o feeling bad x 1 week with headache, chest pain and sob. C/o generalized weakness. No N/v/d. C/o chest pain across center of chest 3/10.  C/o h/a frontal and top of head rates 3-4/10.

## 2013-11-30 NOTE — ED Notes (Addendum)
Extremeties with equal strength x 4. Pupils 3 and perrl. Alert and oriented, follows commands appropriately. Lungs clear x 2.

## 2013-11-30 NOTE — ED Notes (Signed)
Dr. Mingo Amber made aware of HGB of 6.5.

## 2013-12-06 DIAGNOSIS — D62 Acute posthemorrhagic anemia: Secondary | ICD-10-CM | POA: Diagnosis not present

## 2013-12-06 DIAGNOSIS — D649 Anemia, unspecified: Secondary | ICD-10-CM | POA: Diagnosis not present

## 2013-12-06 DIAGNOSIS — E1142 Type 2 diabetes mellitus with diabetic polyneuropathy: Secondary | ICD-10-CM | POA: Diagnosis present

## 2013-12-06 DIAGNOSIS — K5900 Constipation, unspecified: Secondary | ICD-10-CM | POA: Diagnosis present

## 2013-12-06 DIAGNOSIS — Z794 Long term (current) use of insulin: Secondary | ICD-10-CM | POA: Diagnosis not present

## 2013-12-06 DIAGNOSIS — I252 Old myocardial infarction: Secondary | ICD-10-CM | POA: Diagnosis not present

## 2013-12-06 DIAGNOSIS — I509 Heart failure, unspecified: Secondary | ICD-10-CM | POA: Diagnosis not present

## 2013-12-06 DIAGNOSIS — I517 Cardiomegaly: Secondary | ICD-10-CM | POA: Diagnosis not present

## 2013-12-06 DIAGNOSIS — D591 Other autoimmune hemolytic anemias: Secondary | ICD-10-CM | POA: Diagnosis not present

## 2013-12-06 DIAGNOSIS — K922 Gastrointestinal hemorrhage, unspecified: Secondary | ICD-10-CM | POA: Diagnosis not present

## 2013-12-06 DIAGNOSIS — I2510 Atherosclerotic heart disease of native coronary artery without angina pectoris: Secondary | ICD-10-CM | POA: Diagnosis present

## 2013-12-06 DIAGNOSIS — N390 Urinary tract infection, site not specified: Secondary | ICD-10-CM | POA: Diagnosis not present

## 2013-12-06 DIAGNOSIS — D599 Acquired hemolytic anemia, unspecified: Secondary | ICD-10-CM | POA: Diagnosis not present

## 2013-12-06 DIAGNOSIS — Z87891 Personal history of nicotine dependence: Secondary | ICD-10-CM | POA: Diagnosis not present

## 2013-12-06 DIAGNOSIS — I420 Dilated cardiomyopathy: Secondary | ICD-10-CM | POA: Diagnosis not present

## 2013-12-06 DIAGNOSIS — I129 Hypertensive chronic kidney disease with stage 1 through stage 4 chronic kidney disease, or unspecified chronic kidney disease: Secondary | ICD-10-CM | POA: Diagnosis present

## 2013-12-06 DIAGNOSIS — K222 Esophageal obstruction: Secondary | ICD-10-CM | POA: Diagnosis not present

## 2013-12-06 DIAGNOSIS — D500 Iron deficiency anemia secondary to blood loss (chronic): Secondary | ICD-10-CM | POA: Diagnosis not present

## 2013-12-06 DIAGNOSIS — K810 Acute cholecystitis: Secondary | ICD-10-CM | POA: Diagnosis not present

## 2013-12-06 DIAGNOSIS — R0609 Other forms of dyspnea: Secondary | ICD-10-CM | POA: Diagnosis not present

## 2013-12-06 DIAGNOSIS — Z8546 Personal history of malignant neoplasm of prostate: Secondary | ICD-10-CM | POA: Diagnosis not present

## 2013-12-06 DIAGNOSIS — R079 Chest pain, unspecified: Secondary | ICD-10-CM | POA: Diagnosis not present

## 2013-12-06 DIAGNOSIS — E780 Pure hypercholesterolemia: Secondary | ICD-10-CM | POA: Diagnosis present

## 2013-12-06 DIAGNOSIS — J449 Chronic obstructive pulmonary disease, unspecified: Secondary | ICD-10-CM | POA: Diagnosis present

## 2013-12-06 DIAGNOSIS — R600 Localized edema: Secondary | ICD-10-CM | POA: Diagnosis not present

## 2013-12-06 DIAGNOSIS — K449 Diaphragmatic hernia without obstruction or gangrene: Secondary | ICD-10-CM | POA: Diagnosis present

## 2013-12-06 DIAGNOSIS — B9620 Unspecified Escherichia coli [E. coli] as the cause of diseases classified elsewhere: Secondary | ICD-10-CM | POA: Diagnosis not present

## 2013-12-06 DIAGNOSIS — G8929 Other chronic pain: Secondary | ICD-10-CM | POA: Diagnosis present

## 2013-12-06 DIAGNOSIS — E119 Type 2 diabetes mellitus without complications: Secondary | ICD-10-CM | POA: Diagnosis not present

## 2013-12-06 DIAGNOSIS — M545 Low back pain: Secondary | ICD-10-CM | POA: Diagnosis present

## 2013-12-06 DIAGNOSIS — D72829 Elevated white blood cell count, unspecified: Secondary | ICD-10-CM | POA: Diagnosis not present

## 2013-12-06 DIAGNOSIS — E1140 Type 2 diabetes mellitus with diabetic neuropathy, unspecified: Secondary | ICD-10-CM | POA: Diagnosis not present

## 2013-12-06 DIAGNOSIS — I429 Cardiomyopathy, unspecified: Secondary | ICD-10-CM | POA: Diagnosis not present

## 2013-12-06 DIAGNOSIS — N189 Chronic kidney disease, unspecified: Secondary | ICD-10-CM | POA: Diagnosis present

## 2013-12-06 DIAGNOSIS — K2970 Gastritis, unspecified, without bleeding: Secondary | ICD-10-CM | POA: Diagnosis present

## 2013-12-06 DIAGNOSIS — B370 Candidal stomatitis: Secondary | ICD-10-CM | POA: Diagnosis not present

## 2013-12-06 DIAGNOSIS — R1010 Upper abdominal pain, unspecified: Secondary | ICD-10-CM | POA: Diagnosis not present

## 2013-12-06 DIAGNOSIS — K819 Cholecystitis, unspecified: Secondary | ICD-10-CM | POA: Diagnosis not present

## 2013-12-06 DIAGNOSIS — Z95810 Presence of automatic (implantable) cardiac defibrillator: Secondary | ICD-10-CM | POA: Diagnosis not present

## 2013-12-06 DIAGNOSIS — R109 Unspecified abdominal pain: Secondary | ICD-10-CM | POA: Diagnosis not present

## 2013-12-06 DIAGNOSIS — I5022 Chronic systolic (congestive) heart failure: Secondary | ICD-10-CM | POA: Diagnosis not present

## 2013-12-20 DIAGNOSIS — K819 Cholecystitis, unspecified: Secondary | ICD-10-CM | POA: Diagnosis not present

## 2013-12-20 DIAGNOSIS — D649 Anemia, unspecified: Secondary | ICD-10-CM | POA: Diagnosis not present

## 2013-12-20 DIAGNOSIS — K922 Gastrointestinal hemorrhage, unspecified: Secondary | ICD-10-CM | POA: Diagnosis not present

## 2013-12-20 DIAGNOSIS — I429 Cardiomyopathy, unspecified: Secondary | ICD-10-CM | POA: Diagnosis not present

## 2013-12-20 DIAGNOSIS — E1140 Type 2 diabetes mellitus with diabetic neuropathy, unspecified: Secondary | ICD-10-CM | POA: Diagnosis not present

## 2013-12-20 DIAGNOSIS — I509 Heart failure, unspecified: Secondary | ICD-10-CM | POA: Diagnosis not present

## 2013-12-21 DIAGNOSIS — K922 Gastrointestinal hemorrhage, unspecified: Secondary | ICD-10-CM | POA: Diagnosis not present

## 2013-12-21 DIAGNOSIS — D649 Anemia, unspecified: Secondary | ICD-10-CM | POA: Diagnosis not present

## 2013-12-21 DIAGNOSIS — E1140 Type 2 diabetes mellitus with diabetic neuropathy, unspecified: Secondary | ICD-10-CM | POA: Diagnosis not present

## 2013-12-21 DIAGNOSIS — K819 Cholecystitis, unspecified: Secondary | ICD-10-CM | POA: Diagnosis not present

## 2013-12-21 DIAGNOSIS — I429 Cardiomyopathy, unspecified: Secondary | ICD-10-CM | POA: Diagnosis not present

## 2013-12-21 DIAGNOSIS — I509 Heart failure, unspecified: Secondary | ICD-10-CM | POA: Diagnosis not present

## 2013-12-22 DIAGNOSIS — I429 Cardiomyopathy, unspecified: Secondary | ICD-10-CM | POA: Diagnosis not present

## 2013-12-22 DIAGNOSIS — I509 Heart failure, unspecified: Secondary | ICD-10-CM | POA: Diagnosis not present

## 2013-12-22 DIAGNOSIS — E1140 Type 2 diabetes mellitus with diabetic neuropathy, unspecified: Secondary | ICD-10-CM | POA: Diagnosis not present

## 2013-12-22 DIAGNOSIS — D649 Anemia, unspecified: Secondary | ICD-10-CM | POA: Diagnosis not present

## 2013-12-22 DIAGNOSIS — C61 Malignant neoplasm of prostate: Secondary | ICD-10-CM | POA: Diagnosis not present

## 2013-12-22 DIAGNOSIS — K819 Cholecystitis, unspecified: Secondary | ICD-10-CM | POA: Diagnosis not present

## 2013-12-22 DIAGNOSIS — K922 Gastrointestinal hemorrhage, unspecified: Secondary | ICD-10-CM | POA: Diagnosis not present

## 2013-12-25 DIAGNOSIS — E1140 Type 2 diabetes mellitus with diabetic neuropathy, unspecified: Secondary | ICD-10-CM | POA: Diagnosis not present

## 2013-12-25 DIAGNOSIS — K819 Cholecystitis, unspecified: Secondary | ICD-10-CM | POA: Diagnosis not present

## 2013-12-25 DIAGNOSIS — K922 Gastrointestinal hemorrhage, unspecified: Secondary | ICD-10-CM | POA: Diagnosis not present

## 2013-12-25 DIAGNOSIS — I429 Cardiomyopathy, unspecified: Secondary | ICD-10-CM | POA: Diagnosis not present

## 2013-12-25 DIAGNOSIS — I509 Heart failure, unspecified: Secondary | ICD-10-CM | POA: Diagnosis not present

## 2013-12-25 DIAGNOSIS — C61 Malignant neoplasm of prostate: Secondary | ICD-10-CM | POA: Diagnosis not present

## 2013-12-25 DIAGNOSIS — D649 Anemia, unspecified: Secondary | ICD-10-CM | POA: Diagnosis not present

## 2013-12-26 DIAGNOSIS — D649 Anemia, unspecified: Secondary | ICD-10-CM | POA: Diagnosis not present

## 2013-12-27 DIAGNOSIS — K922 Gastrointestinal hemorrhage, unspecified: Secondary | ICD-10-CM | POA: Diagnosis not present

## 2013-12-27 DIAGNOSIS — D649 Anemia, unspecified: Secondary | ICD-10-CM | POA: Diagnosis not present

## 2013-12-27 DIAGNOSIS — I509 Heart failure, unspecified: Secondary | ICD-10-CM | POA: Diagnosis not present

## 2013-12-27 DIAGNOSIS — K819 Cholecystitis, unspecified: Secondary | ICD-10-CM | POA: Diagnosis not present

## 2013-12-27 DIAGNOSIS — I429 Cardiomyopathy, unspecified: Secondary | ICD-10-CM | POA: Diagnosis not present

## 2013-12-27 DIAGNOSIS — E1140 Type 2 diabetes mellitus with diabetic neuropathy, unspecified: Secondary | ICD-10-CM | POA: Diagnosis not present

## 2013-12-29 DIAGNOSIS — D649 Anemia, unspecified: Secondary | ICD-10-CM | POA: Diagnosis not present

## 2013-12-29 DIAGNOSIS — E1140 Type 2 diabetes mellitus with diabetic neuropathy, unspecified: Secondary | ICD-10-CM | POA: Diagnosis not present

## 2013-12-29 DIAGNOSIS — K922 Gastrointestinal hemorrhage, unspecified: Secondary | ICD-10-CM | POA: Diagnosis not present

## 2013-12-29 DIAGNOSIS — I429 Cardiomyopathy, unspecified: Secondary | ICD-10-CM | POA: Diagnosis not present

## 2013-12-29 DIAGNOSIS — I509 Heart failure, unspecified: Secondary | ICD-10-CM | POA: Diagnosis not present

## 2013-12-29 DIAGNOSIS — C61 Malignant neoplasm of prostate: Secondary | ICD-10-CM | POA: Diagnosis not present

## 2013-12-29 DIAGNOSIS — K819 Cholecystitis, unspecified: Secondary | ICD-10-CM | POA: Diagnosis not present

## 2014-01-01 DIAGNOSIS — E1140 Type 2 diabetes mellitus with diabetic neuropathy, unspecified: Secondary | ICD-10-CM | POA: Diagnosis not present

## 2014-01-01 DIAGNOSIS — K819 Cholecystitis, unspecified: Secondary | ICD-10-CM | POA: Diagnosis not present

## 2014-01-01 DIAGNOSIS — I509 Heart failure, unspecified: Secondary | ICD-10-CM | POA: Diagnosis not present

## 2014-01-01 DIAGNOSIS — I429 Cardiomyopathy, unspecified: Secondary | ICD-10-CM | POA: Diagnosis not present

## 2014-01-01 DIAGNOSIS — C61 Malignant neoplasm of prostate: Secondary | ICD-10-CM | POA: Diagnosis not present

## 2014-01-01 DIAGNOSIS — K922 Gastrointestinal hemorrhage, unspecified: Secondary | ICD-10-CM | POA: Diagnosis not present

## 2014-01-01 DIAGNOSIS — D649 Anemia, unspecified: Secondary | ICD-10-CM | POA: Diagnosis not present

## 2014-01-02 DIAGNOSIS — E1140 Type 2 diabetes mellitus with diabetic neuropathy, unspecified: Secondary | ICD-10-CM | POA: Diagnosis not present

## 2014-01-02 DIAGNOSIS — I509 Heart failure, unspecified: Secondary | ICD-10-CM | POA: Diagnosis not present

## 2014-01-02 DIAGNOSIS — K922 Gastrointestinal hemorrhage, unspecified: Secondary | ICD-10-CM | POA: Diagnosis not present

## 2014-01-02 DIAGNOSIS — K819 Cholecystitis, unspecified: Secondary | ICD-10-CM | POA: Diagnosis not present

## 2014-01-02 DIAGNOSIS — I429 Cardiomyopathy, unspecified: Secondary | ICD-10-CM | POA: Diagnosis not present

## 2014-01-02 DIAGNOSIS — D649 Anemia, unspecified: Secondary | ICD-10-CM | POA: Diagnosis not present

## 2014-01-03 DIAGNOSIS — K922 Gastrointestinal hemorrhage, unspecified: Secondary | ICD-10-CM | POA: Diagnosis not present

## 2014-01-03 DIAGNOSIS — K819 Cholecystitis, unspecified: Secondary | ICD-10-CM | POA: Diagnosis not present

## 2014-01-03 DIAGNOSIS — E1140 Type 2 diabetes mellitus with diabetic neuropathy, unspecified: Secondary | ICD-10-CM | POA: Diagnosis not present

## 2014-01-03 DIAGNOSIS — I509 Heart failure, unspecified: Secondary | ICD-10-CM | POA: Diagnosis not present

## 2014-01-03 DIAGNOSIS — I429 Cardiomyopathy, unspecified: Secondary | ICD-10-CM | POA: Diagnosis not present

## 2014-01-03 DIAGNOSIS — D649 Anemia, unspecified: Secondary | ICD-10-CM | POA: Diagnosis not present

## 2014-01-04 DIAGNOSIS — C61 Malignant neoplasm of prostate: Secondary | ICD-10-CM | POA: Diagnosis not present

## 2014-01-05 DIAGNOSIS — D500 Iron deficiency anemia secondary to blood loss (chronic): Secondary | ICD-10-CM | POA: Diagnosis not present

## 2014-01-05 DIAGNOSIS — D649 Anemia, unspecified: Secondary | ICD-10-CM | POA: Diagnosis not present

## 2014-01-10 DIAGNOSIS — D500 Iron deficiency anemia secondary to blood loss (chronic): Secondary | ICD-10-CM | POA: Diagnosis not present

## 2014-01-15 DIAGNOSIS — D591 Other autoimmune hemolytic anemias: Secondary | ICD-10-CM | POA: Diagnosis not present

## 2014-01-15 DIAGNOSIS — D500 Iron deficiency anemia secondary to blood loss (chronic): Secondary | ICD-10-CM | POA: Diagnosis not present

## 2014-01-16 DIAGNOSIS — D500 Iron deficiency anemia secondary to blood loss (chronic): Secondary | ICD-10-CM | POA: Diagnosis not present

## 2014-01-16 DIAGNOSIS — Z8719 Personal history of other diseases of the digestive system: Secondary | ICD-10-CM | POA: Diagnosis not present

## 2014-01-16 DIAGNOSIS — K810 Acute cholecystitis: Secondary | ICD-10-CM | POA: Diagnosis not present

## 2014-01-17 DIAGNOSIS — I509 Heart failure, unspecified: Secondary | ICD-10-CM | POA: Diagnosis not present

## 2014-01-17 DIAGNOSIS — D649 Anemia, unspecified: Secondary | ICD-10-CM | POA: Diagnosis not present

## 2014-01-17 DIAGNOSIS — I429 Cardiomyopathy, unspecified: Secondary | ICD-10-CM | POA: Diagnosis not present

## 2014-01-17 DIAGNOSIS — K922 Gastrointestinal hemorrhage, unspecified: Secondary | ICD-10-CM | POA: Diagnosis not present

## 2014-01-17 DIAGNOSIS — E1140 Type 2 diabetes mellitus with diabetic neuropathy, unspecified: Secondary | ICD-10-CM | POA: Diagnosis not present

## 2014-01-17 DIAGNOSIS — K819 Cholecystitis, unspecified: Secondary | ICD-10-CM | POA: Diagnosis not present

## 2014-01-18 DIAGNOSIS — I509 Heart failure, unspecified: Secondary | ICD-10-CM | POA: Diagnosis not present

## 2014-01-18 DIAGNOSIS — E1140 Type 2 diabetes mellitus with diabetic neuropathy, unspecified: Secondary | ICD-10-CM | POA: Diagnosis not present

## 2014-01-18 DIAGNOSIS — K819 Cholecystitis, unspecified: Secondary | ICD-10-CM | POA: Diagnosis not present

## 2014-01-18 DIAGNOSIS — I429 Cardiomyopathy, unspecified: Secondary | ICD-10-CM | POA: Diagnosis not present

## 2014-01-18 DIAGNOSIS — D649 Anemia, unspecified: Secondary | ICD-10-CM | POA: Diagnosis not present

## 2014-01-18 DIAGNOSIS — K922 Gastrointestinal hemorrhage, unspecified: Secondary | ICD-10-CM | POA: Diagnosis not present

## 2014-01-19 DIAGNOSIS — D591 Other autoimmune hemolytic anemias: Secondary | ICD-10-CM | POA: Diagnosis not present

## 2014-01-22 DIAGNOSIS — I429 Cardiomyopathy, unspecified: Secondary | ICD-10-CM | POA: Diagnosis not present

## 2014-01-22 DIAGNOSIS — I509 Heart failure, unspecified: Secondary | ICD-10-CM | POA: Diagnosis not present

## 2014-01-22 DIAGNOSIS — D649 Anemia, unspecified: Secondary | ICD-10-CM | POA: Diagnosis not present

## 2014-01-22 DIAGNOSIS — E1140 Type 2 diabetes mellitus with diabetic neuropathy, unspecified: Secondary | ICD-10-CM | POA: Diagnosis not present

## 2014-01-22 DIAGNOSIS — K922 Gastrointestinal hemorrhage, unspecified: Secondary | ICD-10-CM | POA: Diagnosis not present

## 2014-01-22 DIAGNOSIS — K819 Cholecystitis, unspecified: Secondary | ICD-10-CM | POA: Diagnosis not present

## 2014-01-23 DIAGNOSIS — I429 Cardiomyopathy, unspecified: Secondary | ICD-10-CM | POA: Diagnosis not present

## 2014-01-23 DIAGNOSIS — I509 Heart failure, unspecified: Secondary | ICD-10-CM | POA: Diagnosis not present

## 2014-01-23 DIAGNOSIS — D649 Anemia, unspecified: Secondary | ICD-10-CM | POA: Diagnosis not present

## 2014-01-23 DIAGNOSIS — E1140 Type 2 diabetes mellitus with diabetic neuropathy, unspecified: Secondary | ICD-10-CM | POA: Diagnosis not present

## 2014-01-23 DIAGNOSIS — K819 Cholecystitis, unspecified: Secondary | ICD-10-CM | POA: Diagnosis not present

## 2014-01-23 DIAGNOSIS — K922 Gastrointestinal hemorrhage, unspecified: Secondary | ICD-10-CM | POA: Diagnosis not present

## 2014-01-25 DIAGNOSIS — Z4659 Encounter for fitting and adjustment of other gastrointestinal appliance and device: Secondary | ICD-10-CM | POA: Diagnosis not present

## 2014-01-25 DIAGNOSIS — I429 Cardiomyopathy, unspecified: Secondary | ICD-10-CM | POA: Diagnosis not present

## 2014-01-25 DIAGNOSIS — K922 Gastrointestinal hemorrhage, unspecified: Secondary | ICD-10-CM | POA: Diagnosis not present

## 2014-01-25 DIAGNOSIS — E1140 Type 2 diabetes mellitus with diabetic neuropathy, unspecified: Secondary | ICD-10-CM | POA: Diagnosis not present

## 2014-01-25 DIAGNOSIS — D649 Anemia, unspecified: Secondary | ICD-10-CM | POA: Diagnosis not present

## 2014-01-25 DIAGNOSIS — I509 Heart failure, unspecified: Secondary | ICD-10-CM | POA: Diagnosis not present

## 2014-01-25 DIAGNOSIS — K819 Cholecystitis, unspecified: Secondary | ICD-10-CM | POA: Diagnosis not present

## 2014-01-26 DIAGNOSIS — D591 Other autoimmune hemolytic anemias: Secondary | ICD-10-CM | POA: Diagnosis not present

## 2014-01-29 DIAGNOSIS — Z434 Encounter for attention to other artificial openings of digestive tract: Secondary | ICD-10-CM | POA: Diagnosis not present

## 2014-01-29 DIAGNOSIS — K810 Acute cholecystitis: Secondary | ICD-10-CM | POA: Diagnosis not present

## 2014-02-02 DIAGNOSIS — D591 Other autoimmune hemolytic anemias: Secondary | ICD-10-CM | POA: Diagnosis not present

## 2014-02-06 DIAGNOSIS — Z434 Encounter for attention to other artificial openings of digestive tract: Secondary | ICD-10-CM | POA: Diagnosis not present

## 2014-02-06 DIAGNOSIS — R0602 Shortness of breath: Secondary | ICD-10-CM | POA: Diagnosis not present

## 2014-02-06 DIAGNOSIS — I420 Dilated cardiomyopathy: Secondary | ICD-10-CM | POA: Diagnosis not present

## 2014-02-06 DIAGNOSIS — Z794 Long term (current) use of insulin: Secondary | ICD-10-CM | POA: Diagnosis not present

## 2014-02-06 DIAGNOSIS — Z950 Presence of cardiac pacemaker: Secondary | ICD-10-CM | POA: Diagnosis not present

## 2014-02-06 DIAGNOSIS — I5023 Acute on chronic systolic (congestive) heart failure: Secondary | ICD-10-CM | POA: Diagnosis not present

## 2014-02-06 DIAGNOSIS — Z8719 Personal history of other diseases of the digestive system: Secondary | ICD-10-CM | POA: Diagnosis not present

## 2014-02-13 DIAGNOSIS — K922 Gastrointestinal hemorrhage, unspecified: Secondary | ICD-10-CM | POA: Diagnosis not present

## 2014-02-13 DIAGNOSIS — R0602 Shortness of breath: Secondary | ICD-10-CM | POA: Diagnosis not present

## 2014-02-13 DIAGNOSIS — I509 Heart failure, unspecified: Secondary | ICD-10-CM | POA: Diagnosis not present

## 2014-02-13 DIAGNOSIS — D649 Anemia, unspecified: Secondary | ICD-10-CM | POA: Diagnosis not present

## 2014-02-13 DIAGNOSIS — I429 Cardiomyopathy, unspecified: Secondary | ICD-10-CM | POA: Diagnosis not present

## 2014-02-13 DIAGNOSIS — K819 Cholecystitis, unspecified: Secondary | ICD-10-CM | POA: Diagnosis not present

## 2014-02-13 DIAGNOSIS — E1140 Type 2 diabetes mellitus with diabetic neuropathy, unspecified: Secondary | ICD-10-CM | POA: Diagnosis not present

## 2014-02-15 DIAGNOSIS — D591 Other autoimmune hemolytic anemias: Secondary | ICD-10-CM | POA: Diagnosis not present

## 2014-02-21 DIAGNOSIS — K922 Gastrointestinal hemorrhage, unspecified: Secondary | ICD-10-CM | POA: Diagnosis not present

## 2014-02-21 DIAGNOSIS — D649 Anemia, unspecified: Secondary | ICD-10-CM | POA: Diagnosis not present

## 2014-02-21 DIAGNOSIS — E1140 Type 2 diabetes mellitus with diabetic neuropathy, unspecified: Secondary | ICD-10-CM | POA: Diagnosis not present

## 2014-02-21 DIAGNOSIS — I509 Heart failure, unspecified: Secondary | ICD-10-CM | POA: Diagnosis not present

## 2014-02-21 DIAGNOSIS — I429 Cardiomyopathy, unspecified: Secondary | ICD-10-CM | POA: Diagnosis not present

## 2014-02-21 DIAGNOSIS — K819 Cholecystitis, unspecified: Secondary | ICD-10-CM | POA: Diagnosis not present

## 2014-02-27 DIAGNOSIS — Z434 Encounter for attention to other artificial openings of digestive tract: Secondary | ICD-10-CM | POA: Diagnosis not present

## 2014-02-27 DIAGNOSIS — I5023 Acute on chronic systolic (congestive) heart failure: Secondary | ICD-10-CM | POA: Diagnosis not present

## 2014-02-28 DIAGNOSIS — I509 Heart failure, unspecified: Secondary | ICD-10-CM | POA: Diagnosis not present

## 2014-02-28 DIAGNOSIS — D649 Anemia, unspecified: Secondary | ICD-10-CM | POA: Diagnosis not present

## 2014-02-28 DIAGNOSIS — I429 Cardiomyopathy, unspecified: Secondary | ICD-10-CM | POA: Diagnosis not present

## 2014-02-28 DIAGNOSIS — K819 Cholecystitis, unspecified: Secondary | ICD-10-CM | POA: Diagnosis not present

## 2014-02-28 DIAGNOSIS — E1140 Type 2 diabetes mellitus with diabetic neuropathy, unspecified: Secondary | ICD-10-CM | POA: Diagnosis not present

## 2014-02-28 DIAGNOSIS — K922 Gastrointestinal hemorrhage, unspecified: Secondary | ICD-10-CM | POA: Diagnosis not present

## 2014-03-01 DIAGNOSIS — D591 Other autoimmune hemolytic anemias: Secondary | ICD-10-CM | POA: Diagnosis not present

## 2014-03-05 DIAGNOSIS — Z79891 Long term (current) use of opiate analgesic: Secondary | ICD-10-CM | POA: Diagnosis not present

## 2014-03-05 DIAGNOSIS — Z4682 Encounter for fitting and adjustment of non-vascular catheter: Secondary | ICD-10-CM | POA: Diagnosis not present

## 2014-03-05 DIAGNOSIS — I5020 Unspecified systolic (congestive) heart failure: Secondary | ICD-10-CM | POA: Diagnosis not present

## 2014-03-05 DIAGNOSIS — I2510 Atherosclerotic heart disease of native coronary artery without angina pectoris: Secondary | ICD-10-CM | POA: Diagnosis not present

## 2014-03-05 DIAGNOSIS — Z95810 Presence of automatic (implantable) cardiac defibrillator: Secondary | ICD-10-CM | POA: Diagnosis not present

## 2014-03-05 DIAGNOSIS — Z8546 Personal history of malignant neoplasm of prostate: Secondary | ICD-10-CM | POA: Diagnosis not present

## 2014-03-05 DIAGNOSIS — Z7982 Long term (current) use of aspirin: Secondary | ICD-10-CM | POA: Diagnosis not present

## 2014-03-05 DIAGNOSIS — Z794 Long term (current) use of insulin: Secondary | ICD-10-CM | POA: Diagnosis not present

## 2014-03-05 DIAGNOSIS — G4733 Obstructive sleep apnea (adult) (pediatric): Secondary | ICD-10-CM | POA: Diagnosis not present

## 2014-03-05 DIAGNOSIS — Z4659 Encounter for fitting and adjustment of other gastrointestinal appliance and device: Secondary | ICD-10-CM | POA: Diagnosis not present

## 2014-03-05 DIAGNOSIS — I420 Dilated cardiomyopathy: Secondary | ICD-10-CM | POA: Diagnosis not present

## 2014-03-05 DIAGNOSIS — Z791 Long term (current) use of non-steroidal anti-inflammatories (NSAID): Secondary | ICD-10-CM | POA: Diagnosis not present

## 2014-03-05 DIAGNOSIS — Z79899 Other long term (current) drug therapy: Secondary | ICD-10-CM | POA: Diagnosis not present

## 2014-03-05 DIAGNOSIS — Z6839 Body mass index (BMI) 39.0-39.9, adult: Secondary | ICD-10-CM | POA: Diagnosis not present

## 2014-03-05 DIAGNOSIS — Z87891 Personal history of nicotine dependence: Secondary | ICD-10-CM | POA: Diagnosis not present

## 2014-03-05 DIAGNOSIS — I252 Old myocardial infarction: Secondary | ICD-10-CM | POA: Diagnosis not present

## 2014-03-05 DIAGNOSIS — E119 Type 2 diabetes mellitus without complications: Secondary | ICD-10-CM | POA: Diagnosis not present

## 2014-03-05 DIAGNOSIS — I10 Essential (primary) hypertension: Secondary | ICD-10-CM | POA: Diagnosis not present

## 2014-03-07 DIAGNOSIS — K819 Cholecystitis, unspecified: Secondary | ICD-10-CM | POA: Diagnosis not present

## 2014-03-07 DIAGNOSIS — E1140 Type 2 diabetes mellitus with diabetic neuropathy, unspecified: Secondary | ICD-10-CM | POA: Diagnosis not present

## 2014-03-07 DIAGNOSIS — K922 Gastrointestinal hemorrhage, unspecified: Secondary | ICD-10-CM | POA: Diagnosis not present

## 2014-03-07 DIAGNOSIS — I429 Cardiomyopathy, unspecified: Secondary | ICD-10-CM | POA: Diagnosis not present

## 2014-03-07 DIAGNOSIS — I509 Heart failure, unspecified: Secondary | ICD-10-CM | POA: Diagnosis not present

## 2014-03-07 DIAGNOSIS — D649 Anemia, unspecified: Secondary | ICD-10-CM | POA: Diagnosis not present

## 2014-03-14 DIAGNOSIS — I429 Cardiomyopathy, unspecified: Secondary | ICD-10-CM | POA: Diagnosis not present

## 2014-03-14 DIAGNOSIS — D649 Anemia, unspecified: Secondary | ICD-10-CM | POA: Diagnosis not present

## 2014-03-14 DIAGNOSIS — I509 Heart failure, unspecified: Secondary | ICD-10-CM | POA: Diagnosis not present

## 2014-03-14 DIAGNOSIS — K922 Gastrointestinal hemorrhage, unspecified: Secondary | ICD-10-CM | POA: Diagnosis not present

## 2014-03-14 DIAGNOSIS — E1140 Type 2 diabetes mellitus with diabetic neuropathy, unspecified: Secondary | ICD-10-CM | POA: Diagnosis not present

## 2014-03-14 DIAGNOSIS — K819 Cholecystitis, unspecified: Secondary | ICD-10-CM | POA: Diagnosis not present

## 2014-03-21 DIAGNOSIS — D591 Other autoimmune hemolytic anemias: Secondary | ICD-10-CM | POA: Diagnosis not present

## 2014-03-21 DIAGNOSIS — D500 Iron deficiency anemia secondary to blood loss (chronic): Secondary | ICD-10-CM | POA: Diagnosis not present

## 2014-03-27 DIAGNOSIS — D649 Anemia, unspecified: Secondary | ICD-10-CM | POA: Diagnosis not present

## 2014-03-27 DIAGNOSIS — I509 Heart failure, unspecified: Secondary | ICD-10-CM | POA: Diagnosis not present

## 2014-03-27 DIAGNOSIS — K819 Cholecystitis, unspecified: Secondary | ICD-10-CM | POA: Diagnosis not present

## 2014-03-27 DIAGNOSIS — K922 Gastrointestinal hemorrhage, unspecified: Secondary | ICD-10-CM | POA: Diagnosis not present

## 2014-03-27 DIAGNOSIS — E1140 Type 2 diabetes mellitus with diabetic neuropathy, unspecified: Secondary | ICD-10-CM | POA: Diagnosis not present

## 2014-03-27 DIAGNOSIS — I429 Cardiomyopathy, unspecified: Secondary | ICD-10-CM | POA: Diagnosis not present

## 2014-04-10 ENCOUNTER — Emergency Department (HOSPITAL_BASED_OUTPATIENT_CLINIC_OR_DEPARTMENT_OTHER)
Admission: EM | Admit: 2014-04-10 | Discharge: 2014-04-10 | Disposition: A | Discharge status: Home / Self Care | Payer: Medicare Other | Attending: Emergency Medicine | Admitting: Emergency Medicine

## 2014-04-10 ENCOUNTER — Emergency Department (HOSPITAL_BASED_OUTPATIENT_CLINIC_OR_DEPARTMENT_OTHER): Payer: Medicare Other

## 2014-04-10 ENCOUNTER — Encounter (HOSPITAL_BASED_OUTPATIENT_CLINIC_OR_DEPARTMENT_OTHER): Payer: Self-pay | Admitting: Emergency Medicine

## 2014-04-10 DIAGNOSIS — Z79899 Other long term (current) drug therapy: Secondary | ICD-10-CM | POA: Insufficient documentation

## 2014-04-10 DIAGNOSIS — Z950 Presence of cardiac pacemaker: Secondary | ICD-10-CM | POA: Insufficient documentation

## 2014-04-10 DIAGNOSIS — Z794 Long term (current) use of insulin: Secondary | ICD-10-CM | POA: Diagnosis not present

## 2014-04-10 DIAGNOSIS — Z87891 Personal history of nicotine dependence: Secondary | ICD-10-CM | POA: Diagnosis not present

## 2014-04-10 DIAGNOSIS — R05 Cough: Secondary | ICD-10-CM | POA: Insufficient documentation

## 2014-04-10 DIAGNOSIS — G629 Polyneuropathy, unspecified: Secondary | ICD-10-CM | POA: Insufficient documentation

## 2014-04-10 DIAGNOSIS — R0602 Shortness of breath: Secondary | ICD-10-CM | POA: Insufficient documentation

## 2014-04-10 DIAGNOSIS — Z8546 Personal history of malignant neoplasm of prostate: Secondary | ICD-10-CM | POA: Diagnosis not present

## 2014-04-10 DIAGNOSIS — Z8739 Personal history of other diseases of the musculoskeletal system and connective tissue: Secondary | ICD-10-CM | POA: Insufficient documentation

## 2014-04-10 DIAGNOSIS — I509 Heart failure, unspecified: Secondary | ICD-10-CM | POA: Diagnosis not present

## 2014-04-10 DIAGNOSIS — E119 Type 2 diabetes mellitus without complications: Secondary | ICD-10-CM | POA: Insufficient documentation

## 2014-04-10 DIAGNOSIS — I252 Old myocardial infarction: Secondary | ICD-10-CM | POA: Insufficient documentation

## 2014-04-10 DIAGNOSIS — Z9861 Coronary angioplasty status: Secondary | ICD-10-CM | POA: Insufficient documentation

## 2014-04-10 DIAGNOSIS — I10 Essential (primary) hypertension: Secondary | ICD-10-CM | POA: Diagnosis not present

## 2014-04-10 DIAGNOSIS — Z792 Long term (current) use of antibiotics: Secondary | ICD-10-CM | POA: Diagnosis not present

## 2014-04-10 DIAGNOSIS — R059 Cough, unspecified: Secondary | ICD-10-CM

## 2014-04-10 DIAGNOSIS — Z923 Personal history of irradiation: Secondary | ICD-10-CM | POA: Diagnosis not present

## 2014-04-10 DIAGNOSIS — Z87442 Personal history of urinary calculi: Secondary | ICD-10-CM | POA: Insufficient documentation

## 2014-04-10 DIAGNOSIS — M25562 Pain in left knee: Secondary | ICD-10-CM | POA: Diagnosis not present

## 2014-04-10 DIAGNOSIS — R509 Fever, unspecified: Secondary | ICD-10-CM | POA: Diagnosis not present

## 2014-04-10 DIAGNOSIS — N390 Urinary tract infection, site not specified: Secondary | ICD-10-CM | POA: Diagnosis not present

## 2014-04-10 LAB — CBC WITH DIFFERENTIAL/PLATELET
Basophils Absolute: 0 10*3/uL (ref 0.0–0.1)
Basophils Relative: 0 % (ref 0–1)
Eosinophils Absolute: 0.3 10*3/uL (ref 0.0–0.7)
Eosinophils Relative: 5 % (ref 0–5)
HCT: 40 % (ref 39.0–52.0)
Hemoglobin: 13.8 g/dL (ref 13.0–17.0)
Lymphocytes Relative: 18 % (ref 12–46)
Lymphs Abs: 0.9 10*3/uL (ref 0.7–4.0)
MCH: 32.8 pg (ref 26.0–34.0)
MCHC: 34.5 g/dL (ref 30.0–36.0)
MCV: 95 fL (ref 78.0–100.0)
Monocytes Absolute: 0.5 10*3/uL (ref 0.1–1.0)
Monocytes Relative: 10 % (ref 3–12)
Neutro Abs: 3.4 10*3/uL (ref 1.7–7.7)
Neutrophils Relative %: 67 % (ref 43–77)
Platelets: 156 10*3/uL (ref 150–400)
RBC: 4.21 MIL/uL — ABNORMAL LOW (ref 4.22–5.81)
RDW: 15.9 % — ABNORMAL HIGH (ref 11.5–15.5)
WBC: 5.1 10*3/uL (ref 4.0–10.5)

## 2014-04-10 LAB — URINALYSIS, ROUTINE W REFLEX MICROSCOPIC
Bilirubin Urine: NEGATIVE
Glucose, UA: NEGATIVE mg/dL
Hgb urine dipstick: NEGATIVE
Ketones, ur: NEGATIVE mg/dL
Nitrite: NEGATIVE
Protein, ur: NEGATIVE mg/dL
Specific Gravity, Urine: 1.014 (ref 1.005–1.030)
Urobilinogen, UA: 1 mg/dL (ref 0.0–1.0)
pH: 5.5 (ref 5.0–8.0)

## 2014-04-10 LAB — TROPONIN I: Troponin I: <0.03 ng/mL (ref <0.031)

## 2014-04-10 LAB — BASIC METABOLIC PANEL
Anion gap: 4 — ABNORMAL LOW (ref 5–15)
BUN: 11 mg/dL (ref 6–23)
CO2: 29 mmol/L (ref 19–32)
Calcium: 8.8 mg/dL (ref 8.4–10.5)
Chloride: 101 mmol/L (ref 96–112)
Creatinine, Ser: 0.95 mg/dL (ref 0.50–1.35)
GFR calc Af Amer: >90 mL/min (ref >90)
GFR calc non Af Amer: 83 mL/min — ABNORMAL LOW (ref >90)
Glucose, Bld: 191 mg/dL — ABNORMAL HIGH (ref 70–99)
Potassium: 3.9 mmol/L (ref 3.5–5.1)
Sodium: 134 mmol/L — ABNORMAL LOW (ref 135–145)

## 2014-04-10 LAB — URINE MICROSCOPIC-ADD ON

## 2014-04-10 LAB — BRAIN NATRIURETIC PEPTIDE: B Natriuretic Peptide: 90 pg/mL (ref 0.0–100.0)

## 2014-04-10 MED ORDER — PHENAZOPYRIDINE HCL 100 MG PO TABS
100.0000 mg | ORAL_TABLET | Freq: Once | ORAL | Status: AC
  Administered 2014-04-10: 100 mg via ORAL
  Filled 2014-04-10: qty 1

## 2014-04-10 MED ORDER — PHENAZOPYRIDINE HCL 200 MG PO TABS: 200.0000 mg | ORAL_TABLET | Freq: Three times a day (TID) | ORAL | Status: DC

## 2014-04-10 MED ORDER — SULFAMETHOXAZOLE-TRIMETHOPRIM 800-160 MG PO TABS
1.0000 | ORAL_TABLET | Freq: Once | ORAL | Status: AC
  Administered 2014-04-10: 1 via ORAL
  Filled 2014-04-10: qty 1

## 2014-04-10 MED ORDER — SULFAMETHOXAZOLE-TRIMETHOPRIM 800-160 MG PO TABS: 1.0000 | ORAL_TABLET | Freq: Two times a day (BID) | ORAL | Status: AC

## 2014-04-10 NOTE — Discharge Instructions (Signed)
°  Cough, Adult  A cough is a reflex. It helps you clear your throat and airways. A cough can help heal your body. A cough can last 2 or 3 weeks (acute) or may last more than 8 weeks (chronic). Some common causes of a cough can include an infection, allergy, or a cold. HOME CARE  Only take medicine as told by your doctor.  If given, take your medicines (antibiotics) as told. Finish them even if you start to feel better.  Use a cold steam vaporizer or humidifier in your home. This can help loosen thick spit (secretions).  Sleep so you are almost sitting up (semi-upright). Use pillows to do this. This helps reduce coughing.  Rest as needed.  Stop smoking if you smoke. GET HELP RIGHT AWAY IF:  You have yellowish-white fluid (pus) in your thick spit.  Your cough gets worse.  Your medicine does not reduce coughing, and you are losing sleep.  You cough up blood.  You have trouble breathing.  Your pain gets worse and medicine does not help.  You have a fever. MAKE SURE YOU:   Understand these instructions.  Will watch your condition.  Will get help right away if you are not doing well or get worse. Document Released: 10/16/2010 Document Revised: 06/19/2013 Document Reviewed: 10/16/2010 [facility] Patient Information 2015 [place], [place]. This information is not intended to replace advice given to you by your health care provider. Make sure you discuss any questions you have with your health care provider.  Urinary Tract Infection A urinary tract infection (UTI) can occur any place along the urinary tract. The tract includes the kidneys, ureters, bladder, and urethra. A type of germ called bacteria often causes a UTI. UTIs are often helped with antibiotic medicine.  HOME CARE   If given, take antibiotics as told by your doctor. Finish them even if you start to feel better.  Drink enough fluids to keep your pee (urine) clear or pale yellow.  Avoid tea, drinks with caffeine, and  bubbly (carbonated) drinks.  Pee often. Avoid holding your pee in for a long time.  Pee before and after having sex (intercourse).  Wipe from front to back after you poop (bowel movement) if you are a woman. Use each tissue only once. GET HELP RIGHT AWAY IF:   You have back pain.  You have lower belly (abdominal) pain.  You have chills.  You feel sick to your stomach (nauseous).  You throw up (vomit).  Your burning or discomfort with peeing does not go away.  You have a fever.  Your symptoms are not better in 3 days. MAKE SURE YOU:   Understand these instructions.  Will watch your condition.  Will get help right away if you are not doing well or get worse. Document Released: 07/22/2007 Document Revised: 10/28/2011 Document Reviewed: 09/03/2011 [facility] Patient Information 2015 [place], [place]. This information is not intended to replace advice given to you by your health care provider. Make sure you discuss any questions you have with your health care provider.

## 2014-04-10 NOTE — ED Provider Notes (Signed)
CSN: [id number]     Arrival date & time 04/10/14  1443 History   First MD Initiated Contact with Patient 04/10/14 1448     Chief Complaint  Patient presents with  . Shortness of Breath      HPI  Patient's main complaint to me is that his knee is painful. States it is painful all the time. States that that for [place] to Him up all night. Chlamydia states that she felt more short of breath. He states that he believes this is secondary to his knee. Resting is painful than usual. Is not red or swollen present no new injuries. He states he is on methadone and oxycodone for this. States he is "supposed to get my BMI down" before he can have surgery.  He is no more swollen then usual. He has a cough. States had a temperature by 4 last of 101. However no fever today. No more short of breath and usual states "I'm only short of breath". No change in ADLs. No sputum production. No hemoptysis. No chest pain. Urine output. She states that the last 2 nights he's been up about twice as much as usual urinating.  No change in diuretics or any other medications. No dysuria  Past Medical History  Diagnosis Date  . Kidney stones   . Leg swelling   . Low back pain   . Diabetes mellitus   . Peripheral neuropathy   . Obese   . Hypertension   . Myocardial infarction 12/09/1993  . CHF (congestive heart failure)   . Cancer     prostate  (Radiation only)   Past Surgical History  Procedure Laterality Date  . Pacemaker placement    . Joint replacement  2004    right total knee replacement  . Coronary angioplasty with stent placement  1995     x 3 stents  . Multiple tooth extractions      Total Tooth extraction   Family History  Problem Relation Age of Onset  . Diabetes Other   . Hypertension Other   . Heart disease Other   . Arthritis Other    History  Substance Use Topics  . Smoking status: Former Smoker    Types: Cigarettes    Quit date: 10/23/1980  . Smokeless tobacco: Not on file  . Alcohol  Use: No    Review of Systems  Constitutional: Positive for fever. Negative for chills, diaphoresis, appetite change and fatigue.  HENT: Negative for mouth sores, sore throat and trouble swallowing.   Eyes: Negative for visual disturbance.  Respiratory: Positive for cough and shortness of breath. Negative for chest tightness and wheezing.   Cardiovascular: Negative for chest pain.  Gastrointestinal: Negative for nausea, vomiting, abdominal pain, diarrhea and abdominal distention.  Endocrine: Negative for polydipsia, polyphagia and polyuria.  Genitourinary: Positive for frequency. Negative for dysuria and hematuria.  Musculoskeletal: Positive for arthralgias. Negative for gait problem.       Knee pain  Skin: Negative for color change, pallor and rash.  Neurological: Negative for dizziness, syncope, light-headedness and headaches.  Hematological: Does not bruise/bleed easily.  Psychiatric/Behavioral: Negative for behavioral problems and confusion.      Allergies  Review of patient's allergies indicates no known allergies.  Home Medications   Prior to Admission medications   Medication Sig Start Date End Date Taking? Authorizing Provider  albuterol (PROVENTIL HFA;VENTOLIN HFA) 108 (90 BASE) MCG/ACT inhaler Inhale 2 puffs into the lungs every 4 (four) hours as needed for wheezing or shortness  of breath. 08/24/13   Orpah Greek, MD  ALBUTEROL IN Inhale 90 mcg into the lungs as needed.     Historical Provider, MD  amoxicillin-clavulanate (AUGMENTIN) 875-125 MG per tablet Take 1 tablet by mouth 2 (two) times daily. 08/24/13   Orpah Greek, MD  atorvastatin (LIPITOR) 10 MG tablet Take 10 mg by mouth daily.    Historical Provider, MD  benzonatate (TESSALON) 100 MG capsule Take 1 capsule (100 mg total) by mouth every 8 (eight) hours. 08/24/13   Orpah Greek, MD  cephALEXin (KEFLEX) 500 MG capsule Take 1 capsule (500 mg total) by mouth 4 (four) times daily. 09/26/13    Fransico Meadow, PA-C  cephALEXin (KEFLEX) 500 MG capsule Take 1 capsule (500 mg total) by mouth 4 (four) times daily. 09/26/13   Fransico Meadow, PA-C  citalopram (CELEXA) 10 MG tablet Take 20 mg by mouth daily.     Historical Provider, MD  Furosemide (LASIX PO) Take 40 mg by mouth daily.     Historical Provider, MD  GABAPENTIN PO Take 300 mg by mouth 3 (three) times daily.     Historical Provider, MD  HYDROcodone-acetaminophen (NORCO/VICODIN) 5-325 MG per tablet Take 2 tablets by mouth every 4 (four) hours as needed. 09/26/13   Fransico Meadow, PA-C  HYDROCODONE-ACETAMINOPHEN PO Take by mouth. 5/500 mg     Take one tablet four times a day as needed for pain    Historical Provider, MD  insulin NPH-insulin regular (NOVOLIN 70/30) (70-30) 100 UNIT/ML injection Inject into the skin.      Historical Provider, MD  LISINOPRIL PO Take 20 mg by mouth. One half tablet two times a day    Historical Provider, MD  Menthol-Methyl Salicylate 0-08 % CREA Apply topically.      Historical Provider, MD  METHADONE HCL PO Take 10 mg by mouth 4 (four) times daily.     Historical Provider, MD  METOPROLOL TARTRATE PO Take 25 mg by mouth 2 (two) times daily.     Historical Provider, MD  Multiple Vitamin (MULTIVITAMIN) tablet Take 1 tablet by mouth daily.      Historical Provider, MD  nitroGLYCERIN (NITRODUR - DOSED IN MG/24 HR) 0.2 mg/hr Place 1 patch onto the skin daily. Use in conjunction with 0.6 mg/hr patch     Historical Provider, MD  nitroGLYCERIN (NITRODUR - DOSED IN MG/24 HR) 0.6 mg/hr Place 1 patch onto the skin daily.      Historical Provider, MD  nitroGLYCERIN (NITROSTAT) 0.4 MG SL tablet Place 0.4 mg under the tongue every 5 (five) minutes as needed.      Historical Provider, MD  OMEPRAZOLE PO Take 20 mg by mouth 2 (two) times daily.     Historical Provider, MD  OXYBUTYNIN CHLORIDE PO Take 5 mg by mouth 2 (two) times daily.     Historical Provider, MD  OXYCODONE HCL PO Take 5 mg by mouth 2 (two) times daily as  needed.     Historical Provider, MD  oxyCODONE-acetaminophen (PERCOCET/ROXICET) 5-325 MG per tablet Take 2 tablets by mouth every 4 (four) hours as needed for severe pain. 09/26/13   Fransico Meadow, PA-C  phenazopyridine (PYRIDIUM) 200 MG tablet Take 1 tablet (200 mg total) by mouth 3 (three) times daily. 04/10/14   Tanna Furry, MD  SIMVASTATIN PO Take 40 mg by mouth at bedtime.     Historical Provider, MD  sulfamethoxazole-trimethoprim (SEPTRA DS) 800-160 MG per tablet Take 1 tablet by mouth every 12 (  twelve) hours. 04/10/14 04/24/14  Tanna Furry, MD  terazosin (HYTRIN) 2 MG capsule Take 2 mg by mouth. Take 2  At bedtime     Historical Provider, MD  TRAZODONE HCL PO Take by mouth.      Historical Provider, MD   BP 142/66 mmHg  Pulse 68  Temp(Src) 98.3 F (36.8 C) (Oral)  Resp 22  Ht 5\' 8"  (1.727 m)  Wt 259 lb (117.482 kg)  BMI 39.39 kg/m2  SpO2 96% Physical Exam  Constitutional: He is oriented to person, place, and time. He appears well-developed and well-nourished. No distress.  Morbidly obese.  Sitting o edge of bed.  Not dyspneic.  HENT:  Head: Normocephalic.  Eyes: Conjunctivae are normal. Pupils are equal, round, and reactive to light. No scleral icterus.  Neck: Normal range of motion. Neck supple. No thyromegaly present.  Cardiovascular: Normal rate and regular rhythm.  Exam reveals no gallop and no friction rub.   No murmur heard. Pulmonary/Chest: Effort normal and breath sounds normal. No respiratory distress. He has no wheezes. He has no rales.  Distant but symmetric breath sounds. No wheezing rales rhonchi.  No JVD. Sinus rhythm on monitor.  Abdominal: Soft. Bowel sounds are normal. He exhibits no distension. There is no tenderness. There is no rebound.  Musculoskeletal: Normal range of motion.       Legs: Neurological: He is alert and oriented to person, place, and time.  Skin: Skin is warm and dry. No rash noted.  Psychiatric: He has a normal mood and affect. His behavior is  normal.    ED Course  Procedures (including critical care time) Labs Review Labs Reviewed  CBC WITH DIFFERENTIAL/PLATELET - Abnormal; Notable for the following:    RBC 4.21 (*)    RDW 15.9 (*)    All other components within normal limits  BASIC METABOLIC PANEL - Abnormal; Notable for the following:    Sodium 134 (*)    Glucose, Bld 191 (*)    GFR calc non Af Amer 83 (*)    Anion gap 4 (*)    All other components within normal limits  URINALYSIS, ROUTINE W REFLEX MICROSCOPIC - Abnormal; Notable for the following:    Leukocytes, UA MODERATE (*)    All other components within normal limits  URINE MICROSCOPIC-ADD ON - Abnormal; Notable for the following:    Bacteria, UA FEW (*)    All other components within normal limits  TROPONIN I  BRAIN NATRIURETIC PEPTIDE    Imaging Review Dg Chest 2 View  04/10/2014   CLINICAL DATA:  Cough, fever, shortness of Breath  EXAM: CHEST  2 VIEW  COMPARISON:  12/11/2013  FINDINGS: Cardiomediastinal silhouette is stable. Dual lead cardiac pacemaker is unchanged in position. No acute infiltrate or pleural effusion. No pulmonary edema. Stable mild degenerative changes thoracic spine.  IMPRESSION: No active cardiopulmonary disease.   Electronically Signed   By: Lahoma Crocker M.D.   On: 04/10/2014 15:33     EKG Interpretation None      MDM   Final diagnoses:  Cough  UTI (lower urinary tract infection)    No signs of CHF, or  Pneumonia clinically, or radiographically.  Pt with dysuria.  Will treat c Bactrim and Pyridium  Culture ordered.     Tanna Furry, MD 04/10/14 [phone]

## 2014-04-10 NOTE — ED Notes (Signed)
C/o "short of breath all the time"-worse with fever since last night

## 2014-04-10 NOTE — ED Notes (Signed)
Patient transported to X-ray 

## 2014-04-12 LAB — URINE CULTURE: Colony Count: >=100000

## 2014-04-14 ENCOUNTER — Telehealth: Payer: Self-pay | Admitting: *Deleted

## 2014-04-14 NOTE — ED Notes (Unsigned)
(+)  urine culture, treated with Sulfamethoxazole, OK per J. Frens, Pharm 

## 2014-04-18 DIAGNOSIS — D591 Other autoimmune hemolytic anemias: Secondary | ICD-10-CM | POA: Diagnosis not present

## 2014-04-20 DIAGNOSIS — I252 Old myocardial infarction: Secondary | ICD-10-CM | POA: Diagnosis not present

## 2014-04-20 DIAGNOSIS — R0600 Dyspnea, unspecified: Secondary | ICD-10-CM | POA: Diagnosis not present

## 2014-04-20 DIAGNOSIS — I10 Essential (primary) hypertension: Secondary | ICD-10-CM | POA: Diagnosis not present

## 2014-04-20 DIAGNOSIS — J45909 Unspecified asthma, uncomplicated: Secondary | ICD-10-CM | POA: Diagnosis not present

## 2014-04-20 DIAGNOSIS — R05 Cough: Secondary | ICD-10-CM | POA: Diagnosis not present

## 2014-04-20 DIAGNOSIS — M6281 Muscle weakness (generalized): Secondary | ICD-10-CM | POA: Diagnosis not present

## 2014-04-20 DIAGNOSIS — I2510 Atherosclerotic heart disease of native coronary artery without angina pectoris: Secondary | ICD-10-CM | POA: Diagnosis not present

## 2014-04-20 DIAGNOSIS — R0602 Shortness of breath: Secondary | ICD-10-CM | POA: Diagnosis not present

## 2014-04-20 DIAGNOSIS — J9811 Atelectasis: Secondary | ICD-10-CM | POA: Diagnosis not present

## 2014-04-20 DIAGNOSIS — G4730 Sleep apnea, unspecified: Secondary | ICD-10-CM | POA: Diagnosis not present

## 2014-04-20 DIAGNOSIS — J449 Chronic obstructive pulmonary disease, unspecified: Secondary | ICD-10-CM | POA: Diagnosis not present

## 2014-04-20 DIAGNOSIS — E119 Type 2 diabetes mellitus without complications: Secondary | ICD-10-CM | POA: Diagnosis not present

## 2014-04-30 ENCOUNTER — Emergency Department (HOSPITAL_BASED_OUTPATIENT_CLINIC_OR_DEPARTMENT_OTHER): Payer: Medicare Other

## 2014-04-30 ENCOUNTER — Encounter (HOSPITAL_BASED_OUTPATIENT_CLINIC_OR_DEPARTMENT_OTHER): Payer: Self-pay | Admitting: Emergency Medicine

## 2014-04-30 ENCOUNTER — Emergency Department (HOSPITAL_BASED_OUTPATIENT_CLINIC_OR_DEPARTMENT_OTHER)
Admission: EM | Admit: 2014-04-30 | Discharge: 2014-05-01 | Disposition: A | Discharge status: Home / Self Care | Payer: Medicare Other | Attending: Emergency Medicine | Admitting: Emergency Medicine

## 2014-04-30 DIAGNOSIS — Z8546 Personal history of malignant neoplasm of prostate: Secondary | ICD-10-CM | POA: Diagnosis not present

## 2014-04-30 DIAGNOSIS — I509 Heart failure, unspecified: Secondary | ICD-10-CM | POA: Diagnosis not present

## 2014-04-30 DIAGNOSIS — Z794 Long term (current) use of insulin: Secondary | ICD-10-CM | POA: Diagnosis not present

## 2014-04-30 DIAGNOSIS — E119 Type 2 diabetes mellitus without complications: Secondary | ICD-10-CM | POA: Diagnosis not present

## 2014-04-30 DIAGNOSIS — Z950 Presence of cardiac pacemaker: Secondary | ICD-10-CM | POA: Insufficient documentation

## 2014-04-30 DIAGNOSIS — Z9861 Coronary angioplasty status: Secondary | ICD-10-CM | POA: Insufficient documentation

## 2014-04-30 DIAGNOSIS — R0602 Shortness of breath: Secondary | ICD-10-CM | POA: Diagnosis present

## 2014-04-30 DIAGNOSIS — Z79891 Long term (current) use of opiate analgesic: Secondary | ICD-10-CM | POA: Insufficient documentation

## 2014-04-30 DIAGNOSIS — I10 Essential (primary) hypertension: Secondary | ICD-10-CM | POA: Insufficient documentation

## 2014-04-30 DIAGNOSIS — K219 Gastro-esophageal reflux disease without esophagitis: Secondary | ICD-10-CM | POA: Diagnosis not present

## 2014-04-30 DIAGNOSIS — Z87442 Personal history of urinary calculi: Secondary | ICD-10-CM | POA: Diagnosis not present

## 2014-04-30 DIAGNOSIS — Z79899 Other long term (current) drug therapy: Secondary | ICD-10-CM | POA: Insufficient documentation

## 2014-04-30 DIAGNOSIS — E669 Obesity, unspecified: Secondary | ICD-10-CM | POA: Diagnosis not present

## 2014-04-30 DIAGNOSIS — Z8669 Personal history of other diseases of the nervous system and sense organs: Secondary | ICD-10-CM | POA: Diagnosis not present

## 2014-04-30 DIAGNOSIS — Z792 Long term (current) use of antibiotics: Secondary | ICD-10-CM | POA: Insufficient documentation

## 2014-04-30 DIAGNOSIS — G8929 Other chronic pain: Secondary | ICD-10-CM | POA: Diagnosis not present

## 2014-04-30 DIAGNOSIS — Z87891 Personal history of nicotine dependence: Secondary | ICD-10-CM | POA: Insufficient documentation

## 2014-04-30 DIAGNOSIS — I252 Old myocardial infarction: Secondary | ICD-10-CM | POA: Insufficient documentation

## 2014-04-30 DIAGNOSIS — IMO0001 Reserved for inherently not codable concepts without codable children: Secondary | ICD-10-CM

## 2014-04-30 DIAGNOSIS — R141 Gas pain: Secondary | ICD-10-CM | POA: Diagnosis not present

## 2014-04-30 DIAGNOSIS — R0600 Dyspnea, unspecified: Secondary | ICD-10-CM | POA: Diagnosis not present

## 2014-04-30 LAB — CBC WITH DIFFERENTIAL/PLATELET
Basophils Absolute: 0 10*3/uL (ref 0.0–0.1)
Basophils Relative: 0 % (ref 0–1)
Eosinophils Absolute: 0.2 10*3/uL (ref 0.0–0.7)
Eosinophils Relative: 3 % (ref 0–5)
HCT: 36.1 % — ABNORMAL LOW (ref 39.0–52.0)
Hemoglobin: 12.8 g/dL — ABNORMAL LOW (ref 13.0–17.0)
Lymphocytes Relative: 28 % (ref 12–46)
Lymphs Abs: 2.4 10*3/uL (ref 0.7–4.0)
MCH: 33.1 pg (ref 26.0–34.0)
MCHC: 35.5 g/dL (ref 30.0–36.0)
MCV: 93.3 fL (ref 78.0–100.0)
Monocytes Absolute: 0.9 10*3/uL (ref 0.1–1.0)
Monocytes Relative: 10 % (ref 3–12)
Neutro Abs: 5 10*3/uL (ref 1.7–7.7)
Neutrophils Relative %: 59 % (ref 43–77)
Platelets: 154 10*3/uL (ref 150–400)
RBC: 3.87 MIL/uL — ABNORMAL LOW (ref 4.22–5.81)
RDW: 16.1 % — ABNORMAL HIGH (ref 11.5–15.5)
WBC: 8.5 10*3/uL (ref 4.0–10.5)

## 2014-04-30 MED ORDER — GI COCKTAIL ~~LOC~~
30.0000 mL | Freq: Once | ORAL | Status: AC
  Administered 2014-04-30: 30 mL via ORAL
  Filled 2014-04-30: qty 30

## 2014-04-30 NOTE — ED Provider Notes (Signed)
CSN: [id number]     Arrival date & time 04/30/14  1819 History  This chart was scribed for Sanora Cunanan, MD by Evelene Croon, ED Scribe. This patient was seen in room MH09/MH09 and the patient's care was started 11:13 PM.    Chief Complaint  Patient presents with  . Shortness of Breath    Patient is a 69 y.o. male presenting with shortness of breath. The history is provided by the patient. No language interpreter was used.  Shortness of Breath Severity:  Moderate Onset quality:  Gradual Duration: actually years. Timing:  Constant Progression:  Unchanged Chronicity:  Chronic Context: not activity and not URI   Relieved by:  Nothing Worsened by:  Nothing tried Ineffective treatments:  None tried Associated symptoms: no chest pain, no cough, no fever, no headaches, no sore throat and no wheezing   Risk factors: no recent surgery     HPI Comments:  Johnny Ochoa is a 69 y.o. male who presents to the Emergency Department complaining of persistent SOB that started a long time ago.  Pt was evaluated at [facility] ~2 weeks ago and has followed up with his cardiologist but they have been unable to find a source.  His cardiologist and PMD cannot find the source.   No alleviating factors noted.   Past Medical History  Diagnosis Date  . Kidney stones   . Leg swelling   . Low back pain   . Diabetes mellitus   . Peripheral neuropathy   . Obese   . Hypertension   . Myocardial infarction 12/09/1993  . CHF (congestive heart failure)   . Cancer     prostate  (Radiation only)   Past Surgical History  Procedure Laterality Date  . Pacemaker placement    . Joint replacement  2004    right total knee replacement  . Coronary angioplasty with stent placement  1995     x 3 stents  . Multiple tooth extractions      Total Tooth extraction   Family History  Problem Relation Age of Onset  . Diabetes Other   . Hypertension Other   . Heart disease Other   . Arthritis Other    History  Substance  Use Topics  . Smoking status: Former Smoker    Types: Cigarettes    Quit date: 10/23/1980  . Smokeless tobacco: Not on file  . Alcohol Use: No    Review of Systems  Constitutional: Negative for fever.  HENT: Negative for sore throat.   Respiratory: Positive for shortness of breath. Negative for cough and wheezing.   Cardiovascular: Negative for chest pain.  Neurological: Negative for headaches.  All other systems reviewed and are negative.     Allergies  Review of patient's allergies indicates no known allergies.  Home Medications   Prior to Admission medications   Medication Sig Start Date End Date Taking? Authorizing Provider  albuterol (PROVENTIL HFA;VENTOLIN HFA) 108 (90 BASE) MCG/ACT inhaler Inhale 2 puffs into the lungs every 4 (four) hours as needed for wheezing or shortness of breath. 08/24/13   Orpah Greek, MD  ALBUTEROL IN Inhale 90 mcg into the lungs as needed.     Historical Provider, MD  amoxicillin-clavulanate (AUGMENTIN) 875-125 MG per tablet Take 1 tablet by mouth 2 (two) times daily. 08/24/13   Orpah Greek, MD  atorvastatin (LIPITOR) 10 MG tablet Take 10 mg by mouth daily.    Historical Provider, MD  benzonatate (TESSALON) 100 MG capsule Take 1 capsule (100  mg total) by mouth every 8 (eight) hours. 08/24/13   Orpah Greek, MD  cephALEXin (KEFLEX) 500 MG capsule Take 1 capsule (500 mg total) by mouth 4 (four) times daily. 09/26/13   Fransico Meadow, PA-C  cephALEXin (KEFLEX) 500 MG capsule Take 1 capsule (500 mg total) by mouth 4 (four) times daily. 09/26/13   Fransico Meadow, PA-C  citalopram (CELEXA) 10 MG tablet Take 20 mg by mouth daily.     Historical Provider, MD  Furosemide (LASIX PO) Take 40 mg by mouth daily.     Historical Provider, MD  GABAPENTIN PO Take 300 mg by mouth 3 (three) times daily.     Historical Provider, MD  HYDROcodone-acetaminophen (NORCO/VICODIN) 5-325 MG per tablet Take 2 tablets by mouth every 4 (four) hours as  needed. 09/26/13   Fransico Meadow, PA-C  HYDROCODONE-ACETAMINOPHEN PO Take by mouth. 5/500 mg     Take one tablet four times a day as needed for pain    Historical Provider, MD  insulin NPH-insulin regular (NOVOLIN 70/30) (70-30) 100 UNIT/ML injection Inject into the skin.      Historical Provider, MD  LISINOPRIL PO Take 20 mg by mouth. One half tablet two times a day    Historical Provider, MD  Menthol-Methyl Salicylate 8-93 % CREA Apply topically.      Historical Provider, MD  METHADONE HCL PO Take 10 mg by mouth 4 (four) times daily.     Historical Provider, MD  METOPROLOL TARTRATE PO Take 25 mg by mouth 2 (two) times daily.     Historical Provider, MD  Multiple Vitamin (MULTIVITAMIN) tablet Take 1 tablet by mouth daily.      Historical Provider, MD  nitroGLYCERIN (NITRODUR - DOSED IN MG/24 HR) 0.2 mg/hr Place 1 patch onto the skin daily. Use in conjunction with 0.6 mg/hr patch     Historical Provider, MD  nitroGLYCERIN (NITRODUR - DOSED IN MG/24 HR) 0.6 mg/hr Place 1 patch onto the skin daily.      Historical Provider, MD  nitroGLYCERIN (NITROSTAT) 0.4 MG SL tablet Place 0.4 mg under the tongue every 5 (five) minutes as needed.      Historical Provider, MD  OMEPRAZOLE PO Take 20 mg by mouth 2 (two) times daily.     Historical Provider, MD  OXYBUTYNIN CHLORIDE PO Take 5 mg by mouth 2 (two) times daily.     Historical Provider, MD  OXYCODONE HCL PO Take 5 mg by mouth 2 (two) times daily as needed.     Historical Provider, MD  oxyCODONE-acetaminophen (PERCOCET/ROXICET) 5-325 MG per tablet Take 2 tablets by mouth every 4 (four) hours as needed for severe pain. 09/26/13   Fransico Meadow, PA-C  phenazopyridine (PYRIDIUM) 200 MG tablet Take 1 tablet (200 mg total) by mouth 3 (three) times daily. 04/10/14   Tanna Furry, MD  SIMVASTATIN PO Take 40 mg by mouth at bedtime.     Historical Provider, MD  terazosin (HYTRIN) 2 MG capsule Take 2 mg by mouth. Take 2  At bedtime     Historical Provider, MD  TRAZODONE  HCL PO Take by mouth.      Historical Provider, MD   BP 132/67 mmHg  Pulse 82  Resp 22  Ht 5' 8.5" (1.74 m)  Wt 255 lb (115.667 kg)  BMI 38.20 kg/m2  SpO2 98% Physical Exam  Constitutional: He is oriented to person, place, and time. He appears well-developed and well-nourished. No distress.  HENT:  Head: Normocephalic and  atraumatic.  Mouth/Throat: Oropharynx is clear and moist.  Eyes: Conjunctivae are normal. Pupils are equal, round, and reactive to light.  Neck: Normal range of motion. Neck supple.  Cardiovascular: Normal rate, regular rhythm and normal heart sounds.   Pulmonary/Chest: Effort normal and breath sounds normal. No stridor. No respiratory distress. He has no wheezes. He has no rales. He exhibits no tenderness.  Abdominal: Soft. He exhibits no distension. There is no tenderness. There is no rebound and no guarding.  hyperactive bowel sounds into thoracic cavity.   Musculoskeletal: Normal range of motion. He exhibits no edema or tenderness.  Neurological: He is alert and oriented to person, place, and time. He has normal reflexes.  Skin: Skin is warm and dry.  Psychiatric: He has a normal mood and affect. His behavior is normal.  Nursing note and vitals reviewed.   ED Course  Procedures   DIAGNOSTIC STUDIES:  Oxygen Saturation is 98% on RA, normal by my interpretation.    COORDINATION OF CARE:  11:16 PM Discussed treatment plan with pt at bedside and pt agreed to plan.  Labs Review Labs Reviewed - No data to display  Imaging Review Dg Chest 2 View  04/30/2014   CLINICAL DATA:  Shortness of breath and weakness for 1 month.  EXAM: CHEST  2 VIEW  COMPARISON:  PA and lateral chest 04/20/2014.  CT chest 04/20/2014.  FINDINGS: AICD is in place and unchanged. Heart size is normal. Lungs clear. No pneumothorax or pleural effusion.  IMPRESSION: No acute disease.   Electronically Signed   By: Inge Rise M.D.   On: 04/30/2014 20:33     EKG  Interpretation   Date/Time:  Monday April 30 2014 18:26:20 EDT Ventricular Rate:  90 PR Interval:  152 QRS Duration: 94 QT Interval:  380 QTC Calculation: 464 R Axis:   24 Text Interpretation:  Normal sinus rhythm Normal ECG No significant change  was found Confirmed by Wyvonnia Dusky  MD, STEPHEN [id number]) on 04/30/2014 6:35:58  PM      MDM   Final diagnoses:  None   In the setting of unchanged EKG and negative troponin ACS is excluded.    Shortness of breath for years neither his PMD nor cardiologist nor other EDs can find source.  Suspect this is GERD related.  Follow up with your PMD and GI.  Adhere to a GERD friendly diet  I personally performed the services described in this documentation, which was scribed in my presence. The recorded information has been reviewed and is accurate.     Veatrice Kells, MD 05/01/14 [phone]

## 2014-04-30 NOTE — ED Notes (Signed)
Pt states that he has been SOB for at least a month.  Pt states he was at [facility] 2 fridays ago and they didn't find anything

## 2014-05-01 ENCOUNTER — Encounter (HOSPITAL_BASED_OUTPATIENT_CLINIC_OR_DEPARTMENT_OTHER): Payer: Self-pay | Admitting: Emergency Medicine

## 2014-05-01 ENCOUNTER — Emergency Department (HOSPITAL_BASED_OUTPATIENT_CLINIC_OR_DEPARTMENT_OTHER): Payer: Medicare Other

## 2014-05-01 DIAGNOSIS — R0600 Dyspnea, unspecified: Secondary | ICD-10-CM | POA: Diagnosis not present

## 2014-05-01 DIAGNOSIS — K219 Gastro-esophageal reflux disease without esophagitis: Secondary | ICD-10-CM | POA: Diagnosis not present

## 2014-05-01 LAB — BASIC METABOLIC PANEL
Anion gap: 10 (ref 5–15)
BUN: 9 mg/dL (ref 6–23)
CO2: 28 mmol/L (ref 19–32)
Calcium: 8.9 mg/dL (ref 8.4–10.5)
Chloride: 100 mmol/L (ref 96–112)
Creatinine, Ser: 1 mg/dL (ref 0.50–1.35)
GFR calc Af Amer: 87 mL/min — ABNORMAL LOW (ref >90)
GFR calc non Af Amer: 75 mL/min — ABNORMAL LOW (ref >90)
Glucose, Bld: 331 mg/dL — ABNORMAL HIGH (ref 70–99)
Potassium: 3.6 mmol/L (ref 3.5–5.1)
Sodium: 138 mmol/L (ref 135–145)

## 2014-05-01 LAB — D-DIMER, QUANTITATIVE: D-Dimer, Quant: 0.74 ug/mL-FEU — ABNORMAL HIGH (ref 0.00–0.48)

## 2014-05-01 LAB — TROPONIN I: Troponin I: <0.03 ng/mL (ref <0.031)

## 2014-05-01 MED ORDER — IOHEXOL 350 MG/ML SOLN
100.0000 mL | Freq: Once | INTRAVENOUS | Status: AC | PRN
  Administered 2014-05-01: 100 mL via INTRAVENOUS

## 2014-05-01 MED ORDER — SUCRALFATE 1 GM/10ML PO SUSP: 1.0000 g | Freq: Three times a day (TID) | ORAL | Status: DC

## 2014-05-11 ENCOUNTER — Telehealth (HOSPITAL_BASED_OUTPATIENT_CLINIC_OR_DEPARTMENT_OTHER): Payer: Self-pay | Admitting: Emergency Medicine

## 2014-05-16 DIAGNOSIS — D591 Other autoimmune hemolytic anemias: Secondary | ICD-10-CM | POA: Diagnosis not present

## 2014-06-18 ENCOUNTER — Encounter (HOSPITAL_BASED_OUTPATIENT_CLINIC_OR_DEPARTMENT_OTHER): Payer: Self-pay | Admitting: *Deleted

## 2014-06-18 ENCOUNTER — Emergency Department (HOSPITAL_BASED_OUTPATIENT_CLINIC_OR_DEPARTMENT_OTHER): Payer: Medicare Other

## 2014-06-18 DIAGNOSIS — Z79899 Other long term (current) drug therapy: Secondary | ICD-10-CM

## 2014-06-18 DIAGNOSIS — Z792 Long term (current) use of antibiotics: Secondary | ICD-10-CM

## 2014-06-18 DIAGNOSIS — I10 Essential (primary) hypertension: Secondary | ICD-10-CM | POA: Insufficient documentation

## 2014-06-18 DIAGNOSIS — Z8546 Personal history of malignant neoplasm of prostate: Secondary | ICD-10-CM

## 2014-06-18 DIAGNOSIS — N3000 Acute cystitis without hematuria: Secondary | ICD-10-CM | POA: Diagnosis not present

## 2014-06-18 DIAGNOSIS — Z87442 Personal history of urinary calculi: Secondary | ICD-10-CM

## 2014-06-18 DIAGNOSIS — Z955 Presence of coronary angioplasty implant and graft: Secondary | ICD-10-CM

## 2014-06-18 DIAGNOSIS — A4150 Gram-negative sepsis, unspecified: Principal | ICD-10-CM | POA: Diagnosis present

## 2014-06-18 DIAGNOSIS — Z794 Long term (current) use of insulin: Secondary | ICD-10-CM

## 2014-06-18 DIAGNOSIS — I252 Old myocardial infarction: Secondary | ICD-10-CM | POA: Insufficient documentation

## 2014-06-18 DIAGNOSIS — Z96651 Presence of right artificial knee joint: Secondary | ICD-10-CM | POA: Diagnosis present

## 2014-06-18 DIAGNOSIS — E119 Type 2 diabetes mellitus without complications: Secondary | ICD-10-CM

## 2014-06-18 DIAGNOSIS — R7881 Bacteremia: Secondary | ICD-10-CM | POA: Diagnosis not present

## 2014-06-18 DIAGNOSIS — Z950 Presence of cardiac pacemaker: Secondary | ICD-10-CM

## 2014-06-18 DIAGNOSIS — E1140 Type 2 diabetes mellitus with diabetic neuropathy, unspecified: Secondary | ICD-10-CM | POA: Diagnosis not present

## 2014-06-18 DIAGNOSIS — E669 Obesity, unspecified: Secondary | ICD-10-CM

## 2014-06-18 DIAGNOSIS — I5022 Chronic systolic (congestive) heart failure: Secondary | ICD-10-CM | POA: Diagnosis not present

## 2014-06-18 DIAGNOSIS — Z833 Family history of diabetes mellitus: Secondary | ICD-10-CM

## 2014-06-18 DIAGNOSIS — Z6841 Body Mass Index (BMI) 40.0 and over, adult: Secondary | ICD-10-CM

## 2014-06-18 DIAGNOSIS — N3090 Cystitis, unspecified without hematuria: Secondary | ICD-10-CM | POA: Insufficient documentation

## 2014-06-18 DIAGNOSIS — N390 Urinary tract infection, site not specified: Secondary | ICD-10-CM | POA: Diagnosis not present

## 2014-06-18 DIAGNOSIS — Z8669 Personal history of other diseases of the nervous system and sense organs: Secondary | ICD-10-CM

## 2014-06-18 DIAGNOSIS — F4310 Post-traumatic stress disorder, unspecified: Secondary | ICD-10-CM | POA: Diagnosis present

## 2014-06-18 DIAGNOSIS — G4733 Obstructive sleep apnea (adult) (pediatric): Secondary | ICD-10-CM | POA: Diagnosis present

## 2014-06-18 DIAGNOSIS — D649 Anemia, unspecified: Secondary | ICD-10-CM | POA: Diagnosis present

## 2014-06-18 DIAGNOSIS — Z8249 Family history of ischemic heart disease and other diseases of the circulatory system: Secondary | ICD-10-CM

## 2014-06-18 DIAGNOSIS — R509 Fever, unspecified: Secondary | ICD-10-CM | POA: Diagnosis not present

## 2014-06-18 DIAGNOSIS — E785 Hyperlipidemia, unspecified: Secondary | ICD-10-CM | POA: Diagnosis present

## 2014-06-18 DIAGNOSIS — Z79891 Long term (current) use of opiate analgesic: Secondary | ICD-10-CM

## 2014-06-18 DIAGNOSIS — Z87891 Personal history of nicotine dependence: Secondary | ICD-10-CM

## 2014-06-18 LAB — CBG MONITORING, ED: Glucose-Capillary: 137 mg/dL — ABNORMAL HIGH (ref 70–99)

## 2014-06-18 MED ORDER — IBUPROFEN 800 MG PO TABS
ORAL_TABLET | ORAL | Status: AC
  Filled 2014-06-18: qty 1

## 2014-06-18 MED ORDER — ACETAMINOPHEN 500 MG PO TABS
1000.0000 mg | ORAL_TABLET | Freq: Once | ORAL | Status: AC
  Administered 2014-06-18: 1000 mg via ORAL
  Filled 2014-06-18: qty 2

## 2014-06-18 MED ORDER — IBUPROFEN 800 MG PO TABS
800.0000 mg | ORAL_TABLET | Freq: Once | ORAL | Status: AC
  Administered 2014-06-18: 800 mg via ORAL

## 2014-06-18 NOTE — ED Notes (Signed)
Pt c/o fever & bodyaches x1 day.  

## 2014-06-19 ENCOUNTER — Encounter (HOSPITAL_COMMUNITY): Payer: Self-pay | Admitting: Emergency Medicine

## 2014-06-19 ENCOUNTER — Inpatient Hospital Stay (HOSPITAL_COMMUNITY)
Admission: EM | Admit: 2014-06-19 | Discharge: 2014-06-23 | DRG: 872 | Disposition: A | Discharge status: Home / Self Care | Payer: Medicare Other | Attending: Internal Medicine | Admitting: Internal Medicine

## 2014-06-19 ENCOUNTER — Emergency Department (HOSPITAL_BASED_OUTPATIENT_CLINIC_OR_DEPARTMENT_OTHER)
Admission: EM | Admit: 2014-06-19 | Discharge: 2014-06-19 | Disposition: A | Discharge status: Home / Self Care | Payer: Medicare Other | Source: Home / Self Care | Attending: Emergency Medicine | Admitting: Emergency Medicine

## 2014-06-19 DIAGNOSIS — A4150 Gram-negative sepsis, unspecified: Secondary | ICD-10-CM | POA: Diagnosis present

## 2014-06-19 DIAGNOSIS — Z96651 Presence of right artificial knee joint: Secondary | ICD-10-CM | POA: Diagnosis present

## 2014-06-19 DIAGNOSIS — I10 Essential (primary) hypertension: Secondary | ICD-10-CM | POA: Diagnosis present

## 2014-06-19 DIAGNOSIS — Z79899 Other long term (current) drug therapy: Secondary | ICD-10-CM | POA: Diagnosis not present

## 2014-06-19 DIAGNOSIS — I5022 Chronic systolic (congestive) heart failure: Secondary | ICD-10-CM | POA: Diagnosis not present

## 2014-06-19 DIAGNOSIS — Z794 Long term (current) use of insulin: Secondary | ICD-10-CM | POA: Diagnosis not present

## 2014-06-19 DIAGNOSIS — D649 Anemia, unspecified: Secondary | ICD-10-CM | POA: Diagnosis present

## 2014-06-19 DIAGNOSIS — Z950 Presence of cardiac pacemaker: Secondary | ICD-10-CM | POA: Diagnosis not present

## 2014-06-19 DIAGNOSIS — E1165 Type 2 diabetes mellitus with hyperglycemia: Secondary | ICD-10-CM

## 2014-06-19 DIAGNOSIS — K760 Fatty (change of) liver, not elsewhere classified: Secondary | ICD-10-CM | POA: Diagnosis not present

## 2014-06-19 DIAGNOSIS — E1140 Type 2 diabetes mellitus with diabetic neuropathy, unspecified: Secondary | ICD-10-CM | POA: Diagnosis present

## 2014-06-19 DIAGNOSIS — Z8249 Family history of ischemic heart disease and other diseases of the circulatory system: Secondary | ICD-10-CM | POA: Diagnosis not present

## 2014-06-19 DIAGNOSIS — I252 Old myocardial infarction: Secondary | ICD-10-CM | POA: Diagnosis not present

## 2014-06-19 DIAGNOSIS — N3000 Acute cystitis without hematuria: Secondary | ICD-10-CM

## 2014-06-19 DIAGNOSIS — IMO0002 Reserved for concepts with insufficient information to code with codable children: Secondary | ICD-10-CM

## 2014-06-19 DIAGNOSIS — N390 Urinary tract infection, site not specified: Secondary | ICD-10-CM | POA: Diagnosis not present

## 2014-06-19 DIAGNOSIS — R7881 Bacteremia: Secondary | ICD-10-CM | POA: Diagnosis present

## 2014-06-19 DIAGNOSIS — E669 Obesity, unspecified: Secondary | ICD-10-CM | POA: Diagnosis present

## 2014-06-19 DIAGNOSIS — F4310 Post-traumatic stress disorder, unspecified: Secondary | ICD-10-CM | POA: Diagnosis present

## 2014-06-19 DIAGNOSIS — Z833 Family history of diabetes mellitus: Secondary | ICD-10-CM | POA: Diagnosis not present

## 2014-06-19 DIAGNOSIS — R509 Fever, unspecified: Secondary | ICD-10-CM | POA: Diagnosis not present

## 2014-06-19 DIAGNOSIS — E118 Type 2 diabetes mellitus with unspecified complications: Secondary | ICD-10-CM

## 2014-06-19 DIAGNOSIS — G4733 Obstructive sleep apnea (adult) (pediatric): Secondary | ICD-10-CM | POA: Diagnosis present

## 2014-06-19 DIAGNOSIS — Z87891 Personal history of nicotine dependence: Secondary | ICD-10-CM | POA: Diagnosis not present

## 2014-06-19 DIAGNOSIS — Z6841 Body Mass Index (BMI) 40.0 and over, adult: Secondary | ICD-10-CM | POA: Diagnosis not present

## 2014-06-19 DIAGNOSIS — Z955 Presence of coronary angioplasty implant and graft: Secondary | ICD-10-CM | POA: Diagnosis not present

## 2014-06-19 DIAGNOSIS — E785 Hyperlipidemia, unspecified: Secondary | ICD-10-CM | POA: Diagnosis present

## 2014-06-19 DIAGNOSIS — Z79891 Long term (current) use of opiate analgesic: Secondary | ICD-10-CM | POA: Diagnosis not present

## 2014-06-19 DIAGNOSIS — Z87442 Personal history of urinary calculi: Secondary | ICD-10-CM | POA: Diagnosis not present

## 2014-06-19 DIAGNOSIS — R1011 Right upper quadrant pain: Secondary | ICD-10-CM

## 2014-06-19 DIAGNOSIS — I5023 Acute on chronic systolic (congestive) heart failure: Secondary | ICD-10-CM | POA: Diagnosis present

## 2014-06-19 HISTORY — DX: Obstructive sleep apnea (adult) (pediatric): G47.33

## 2014-06-19 HISTORY — DX: Post-traumatic stress disorder, unspecified: F43.10

## 2014-06-19 LAB — CBC WITH DIFFERENTIAL/PLATELET
Basophils Absolute: 0 10*3/uL (ref 0.0–0.1)
Basophils Absolute: 0 10*3/uL (ref 0.0–0.1)
Basophils Relative: 0 % (ref 0–1)
Basophils Relative: 0 % (ref 0–1)
Eosinophils Absolute: 0 10*3/uL (ref 0.0–0.7)
Eosinophils Absolute: 0.1 10*3/uL (ref 0.0–0.7)
Eosinophils Relative: 0 % (ref 0–5)
Eosinophils Relative: 1 % (ref 0–5)
HCT: 31.5 % — ABNORMAL LOW (ref 39.0–52.0)
HCT: 34.2 % — ABNORMAL LOW (ref 39.0–52.0)
Hemoglobin: 10.7 g/dL — ABNORMAL LOW (ref 13.0–17.0)
Hemoglobin: 12.2 g/dL — ABNORMAL LOW (ref 13.0–17.0)
Lymphocytes Relative: 4 % — ABNORMAL LOW (ref 12–46)
Lymphocytes Relative: 6 % — ABNORMAL LOW (ref 12–46)
Lymphs Abs: 0.4 10*3/uL — ABNORMAL LOW (ref 0.7–4.0)
Lymphs Abs: 0.6 10*3/uL — ABNORMAL LOW (ref 0.7–4.0)
MCH: 33.1 pg (ref 26.0–34.0)
MCH: 34.3 pg — ABNORMAL HIGH (ref 26.0–34.0)
MCHC: 34 g/dL (ref 30.0–36.0)
MCHC: 35.7 g/dL (ref 30.0–36.0)
MCV: 96.1 fL (ref 78.0–100.0)
MCV: 97.5 fL (ref 78.0–100.0)
Monocytes Absolute: 0.2 10*3/uL (ref 0.1–1.0)
Monocytes Absolute: 0.5 10*3/uL (ref 0.1–1.0)
Monocytes Relative: 2 % — ABNORMAL LOW (ref 3–12)
Monocytes Relative: 6 % (ref 3–12)
Neutro Abs: 8.1 10*3/uL — ABNORMAL HIGH (ref 1.7–7.7)
Neutro Abs: 9.6 10*3/uL — ABNORMAL HIGH (ref 1.7–7.7)
Neutrophils Relative %: 88 % — ABNORMAL HIGH (ref 43–77)
Neutrophils Relative %: 93 % — ABNORMAL HIGH (ref 43–77)
Platelets: 151 10*3/uL (ref 150–400)
Platelets: 181 10*3/uL (ref 150–400)
RBC: 3.23 MIL/uL — ABNORMAL LOW (ref 4.22–5.81)
RBC: 3.56 MIL/uL — ABNORMAL LOW (ref 4.22–5.81)
RDW: 15.8 % — ABNORMAL HIGH (ref 11.5–15.5)
RDW: 16.2 % — ABNORMAL HIGH (ref 11.5–15.5)
WBC: 10.3 10*3/uL (ref 4.0–10.5)
WBC: 9.2 10*3/uL (ref 4.0–10.5)

## 2014-06-19 LAB — BASIC METABOLIC PANEL
Anion gap: 6 (ref 5–15)
BUN: 11 mg/dL (ref 6–20)
CO2: 23 mmol/L (ref 22–32)
Calcium: 8.7 mg/dL — ABNORMAL LOW (ref 8.9–10.3)
Chloride: 107 mmol/L (ref 101–111)
Creatinine, Ser: 0.93 mg/dL (ref 0.61–1.24)
GFR calc Af Amer: >60 mL/min (ref >60)
GFR calc non Af Amer: >60 mL/min (ref >60)
Glucose, Bld: 185 mg/dL — ABNORMAL HIGH (ref 70–99)
Potassium: 3.3 mmol/L — ABNORMAL LOW (ref 3.5–5.1)
Sodium: 136 mmol/L (ref 135–145)

## 2014-06-19 LAB — URINALYSIS, ROUTINE W REFLEX MICROSCOPIC
Bilirubin Urine: NEGATIVE
Glucose, UA: NEGATIVE mg/dL
Glucose, UA: NEGATIVE mg/dL
Hgb urine dipstick: NEGATIVE
Hgb urine dipstick: NEGATIVE
Ketones, ur: NEGATIVE mg/dL
Ketones, ur: NEGATIVE mg/dL
Nitrite: POSITIVE — AB
Nitrite: NEGATIVE
Protein, ur: NEGATIVE mg/dL
Protein, ur: NEGATIVE mg/dL
Specific Gravity, Urine: 1.014 (ref 1.005–1.030)
Specific Gravity, Urine: 1.024 (ref 1.005–1.030)
Urobilinogen, UA: 1 mg/dL (ref 0.0–1.0)
Urobilinogen, UA: 1 mg/dL (ref 0.0–1.0)
pH: 5.5 (ref 5.0–8.0)
pH: 5.5 (ref 5.0–8.0)

## 2014-06-19 LAB — COMPREHENSIVE METABOLIC PANEL
ALT: 22 U/L (ref 17–63)
AST: 34 U/L (ref 15–41)
Albumin: 3.3 g/dL — ABNORMAL LOW (ref 3.5–5.0)
Alkaline Phosphatase: 58 U/L (ref 38–126)
Anion gap: 7 (ref 5–15)
BUN: 19 mg/dL (ref 6–20)
CO2: 26 mmol/L (ref 22–32)
Calcium: 8.4 mg/dL — ABNORMAL LOW (ref 8.9–10.3)
Chloride: 105 mmol/L (ref 101–111)
Creatinine, Ser: 1.24 mg/dL (ref 0.61–1.24)
GFR calc Af Amer: >60 mL/min (ref >60)
GFR calc non Af Amer: 58 mL/min — ABNORMAL LOW (ref >60)
Glucose, Bld: 211 mg/dL — ABNORMAL HIGH (ref 70–99)
Potassium: 3.6 mmol/L (ref 3.5–5.1)
Sodium: 138 mmol/L (ref 135–145)
Total Bilirubin: 1.5 mg/dL — ABNORMAL HIGH (ref 0.3–1.2)
Total Protein: 6.7 g/dL (ref 6.5–8.1)

## 2014-06-19 LAB — URINE MICROSCOPIC-ADD ON

## 2014-06-19 LAB — GLUCOSE, CAPILLARY: Glucose-Capillary: 155 mg/dL — ABNORMAL HIGH (ref 70–99)

## 2014-06-19 LAB — I-STAT CG4 LACTIC ACID, ED
Lactic Acid, Venous: 2.19 mmol/L (ref 0.5–2.0)
Lactic Acid, Venous: 1.81 mmol/L (ref 0.5–2.0)

## 2014-06-19 LAB — I-STAT TROPONIN, ED: Troponin i, poc: 0.04 ng/mL (ref 0.00–0.08)

## 2014-06-19 MED ORDER — PIPERACILLIN-TAZOBACTAM 3.375 G IVPB 30 MIN
3.3750 g | Freq: Once | INTRAVENOUS | Status: AC
  Administered 2014-06-19: 3.375 g via INTRAVENOUS
  Filled 2014-06-19: qty 50

## 2014-06-19 MED ORDER — CEFTRIAXONE SODIUM 1 G IJ SOLR
INTRAMUSCULAR | Status: AC
Start: 2014-06-19 — End: 2014-06-19
  Filled 2014-06-19: qty 10

## 2014-06-19 MED ORDER — SODIUM CHLORIDE 0.9 % IJ SOLN: 3.0000 mL | INTRAMUSCULAR | Status: DC | PRN

## 2014-06-19 MED ORDER — ENOXAPARIN SODIUM 40 MG/0.4ML ~~LOC~~ SOLN
40.0000 mg | Freq: Every day | SUBCUTANEOUS | Status: DC
  Administered 2014-06-19 – 2014-06-22 (×4): 40 mg via SUBCUTANEOUS
  Filled 2014-06-19 (×4): qty 0.4

## 2014-06-19 MED ORDER — ALBUTEROL SULFATE HFA 108 (90 BASE) MCG/ACT IN AERS: 2.0000 | INHALATION_SPRAY | RESPIRATORY_TRACT | Status: DC | PRN

## 2014-06-19 MED ORDER — GABAPENTIN 300 MG PO CAPS
300.0000 mg | ORAL_CAPSULE | Freq: Three times a day (TID) | ORAL | Status: DC
  Administered 2014-06-19 – 2014-06-23 (×11): 300 mg via ORAL
  Filled 2014-06-19 (×12): qty 1

## 2014-06-19 MED ORDER — OXYCODONE-ACETAMINOPHEN 5-325 MG PO TABS
2.0000 | ORAL_TABLET | ORAL | Status: DC | PRN
  Administered 2014-06-20 – 2014-06-21 (×2): 2 via ORAL
  Filled 2014-06-19 (×2): qty 2

## 2014-06-19 MED ORDER — ONDANSETRON HCL 4 MG/2ML IJ SOLN
INTRAMUSCULAR | Status: AC
  Administered 2014-06-19: 4 mg via INTRAVENOUS
  Filled 2014-06-19: qty 2

## 2014-06-19 MED ORDER — ACETAMINOPHEN 325 MG PO TABS: 650.0000 mg | ORAL_TABLET | Freq: Four times a day (QID) | ORAL | Status: DC | PRN

## 2014-06-19 MED ORDER — LISINOPRIL 10 MG PO TABS
10.0000 mg | ORAL_TABLET | Freq: Every day | ORAL | Status: DC
  Administered 2014-06-20 – 2014-06-23 (×4): 10 mg via ORAL
  Filled 2014-06-19 (×4): qty 1

## 2014-06-19 MED ORDER — TERAZOSIN HCL 2 MG PO CAPS
4.0000 mg | ORAL_CAPSULE | Freq: Every day | ORAL | Status: DC
  Administered 2014-06-19 – 2014-06-22 (×4): 4 mg via ORAL
  Filled 2014-06-19 (×4): qty 2

## 2014-06-19 MED ORDER — SODIUM CHLORIDE 0.9 % IV BOLUS (SEPSIS)
1000.0000 mL | Freq: Once | INTRAVENOUS | Status: AC
  Administered 2014-06-19: 1000 mL via INTRAVENOUS

## 2014-06-19 MED ORDER — ATORVASTATIN CALCIUM 10 MG PO TABS
10.0000 mg | ORAL_TABLET | Freq: Every day | ORAL | Status: DC
  Administered 2014-06-20 – 2014-06-22 (×3): 10 mg via ORAL
  Filled 2014-06-19 (×4): qty 1

## 2014-06-19 MED ORDER — ACETAMINOPHEN 325 MG PO TABS
650.0000 mg | ORAL_TABLET | Freq: Once | ORAL | Status: AC
  Administered 2014-06-19: 650 mg via ORAL
  Filled 2014-06-19: qty 2

## 2014-06-19 MED ORDER — NITROGLYCERIN 0.2 MG/HR TD PT24
0.2000 mg | MEDICATED_PATCH | Freq: Every day | TRANSDERMAL | Status: DC
  Administered 2014-06-20 – 2014-06-23 (×4): 0.2 mg via TRANSDERMAL
  Filled 2014-06-19 (×4): qty 1

## 2014-06-19 MED ORDER — ALBUTEROL SULFATE (2.5 MG/3ML) 0.083% IN NEBU: 2.5000 mg | INHALATION_SOLUTION | RESPIRATORY_TRACT | Status: DC | PRN

## 2014-06-19 MED ORDER — ADULT MULTIVITAMIN W/MINERALS CH
1.0000 | ORAL_TABLET | Freq: Every day | ORAL | Status: DC
  Administered 2014-06-20 – 2014-06-23 (×4): 1 via ORAL
  Filled 2014-06-19 (×4): qty 1

## 2014-06-19 MED ORDER — ACETAMINOPHEN 650 MG RE SUPP: 650.0000 mg | Freq: Four times a day (QID) | RECTAL | Status: DC | PRN

## 2014-06-19 MED ORDER — CEPHALEXIN 500 MG PO CAPS: 500.0000 mg | ORAL_CAPSULE | Freq: Three times a day (TID) | ORAL | Status: DC

## 2014-06-19 MED ORDER — DEXTROSE 5 % IV SOLN
1.0000 g | Freq: Once | INTRAVENOUS | Status: AC
  Administered 2014-06-19: 1 g via INTRAVENOUS

## 2014-06-19 MED ORDER — NITROGLYCERIN 0.6 MG/HR TD PT24
0.6000 mg | MEDICATED_PATCH | Freq: Every day | TRANSDERMAL | Status: DC
  Administered 2014-06-20 – 2014-06-23 (×4): 0.6 mg via TRANSDERMAL
  Filled 2014-06-19 (×4): qty 1

## 2014-06-19 MED ORDER — IBUPROFEN 800 MG PO TABS
800.0000 mg | ORAL_TABLET | Freq: Once | ORAL | Status: AC
  Administered 2014-06-19: 800 mg via ORAL
  Filled 2014-06-19: qty 1

## 2014-06-19 MED ORDER — OXYCODONE HCL 5 MG PO TABS: 5.0000 mg | ORAL_TABLET | Freq: Two times a day (BID) | ORAL | Status: DC | PRN

## 2014-06-19 MED ORDER — FUROSEMIDE 40 MG PO TABS
40.0000 mg | ORAL_TABLET | Freq: Two times a day (BID) | ORAL | Status: DC
  Administered 2014-06-19 – 2014-06-23 (×8): 40 mg via ORAL
  Filled 2014-06-19 (×8): qty 1

## 2014-06-19 MED ORDER — INSULIN ASPART 100 UNIT/ML ~~LOC~~ SOLN
0.0000 [IU] | Freq: Three times a day (TID) | SUBCUTANEOUS | Status: DC
  Administered 2014-06-20 (×2): 2 [IU] via SUBCUTANEOUS
  Administered 2014-06-20 – 2014-06-21 (×3): 3 [IU] via SUBCUTANEOUS
  Administered 2014-06-22: 1 [IU] via SUBCUTANEOUS
  Administered 2014-06-22: 5 [IU] via SUBCUTANEOUS
  Administered 2014-06-22 – 2014-06-23 (×2): 2 [IU] via SUBCUTANEOUS

## 2014-06-19 MED ORDER — PANTOPRAZOLE SODIUM 40 MG PO TBEC
40.0000 mg | DELAYED_RELEASE_TABLET | Freq: Every day | ORAL | Status: DC
  Administered 2014-06-20 – 2014-06-23 (×4): 40 mg via ORAL
  Filled 2014-06-19 (×6): qty 1

## 2014-06-19 MED ORDER — NITROGLYCERIN 0.4 MG SL SUBL: 0.4000 mg | SUBLINGUAL_TABLET | SUBLINGUAL | Status: DC | PRN

## 2014-06-19 MED ORDER — SODIUM CHLORIDE 0.9 % IV SOLN: 250.0000 mL | INTRAVENOUS | Status: DC | PRN

## 2014-06-19 MED ORDER — METOPROLOL TARTRATE 25 MG PO TABS
25.0000 mg | ORAL_TABLET | Freq: Two times a day (BID) | ORAL | Status: DC
  Administered 2014-06-19 – 2014-06-23 (×8): 25 mg via ORAL
  Filled 2014-06-19 (×8): qty 1

## 2014-06-19 MED ORDER — ONDANSETRON HCL 4 MG/2ML IJ SOLN
4.0000 mg | Freq: Once | INTRAMUSCULAR | Status: AC
  Administered 2014-06-19: 4 mg via INTRAVENOUS

## 2014-06-19 MED ORDER — METHADONE HCL 10 MG PO TABS
10.0000 mg | ORAL_TABLET | Freq: Four times a day (QID) | ORAL | Status: DC
  Administered 2014-06-19 – 2014-06-23 (×14): 10 mg via ORAL
  Filled 2014-06-19 (×14): qty 1

## 2014-06-19 MED ORDER — SODIUM CHLORIDE 0.9 % IV SOLN: INTRAVENOUS | Status: DC

## 2014-06-19 MED ORDER — INSULIN ASPART 100 UNIT/ML ~~LOC~~ SOLN
0.0000 [IU] | Freq: Every day | SUBCUTANEOUS | Status: DC
  Administered 2014-06-20 – 2014-06-22 (×2): 3 [IU] via SUBCUTANEOUS

## 2014-06-19 MED ORDER — SODIUM CHLORIDE 0.9 % IJ SOLN
3.0000 mL | Freq: Two times a day (BID) | INTRAMUSCULAR | Status: DC
  Administered 2014-06-19 – 2014-06-22 (×7): 3 mL via INTRAVENOUS

## 2014-06-19 NOTE — ED Provider Notes (Signed)
CSN: [id number]     Arrival date & time 06/18/14  2225 History  This chart was scribed for Shanon Rosser, MD by Molli Posey, ED Scribe. This patient was seen in room MH04/MH04 and the patient's care was started 12:37 AM.   Chief Complaint  Patient presents with  . Fever   HPI HPI Comments: Johnny Ochoa is a 69 y.o. male with a history of kidney stones, UTI, DM, HTN, MI, CHF and prostate CA who presents to the Emergency Department complaining of fever that started this evening. He reports a maximum temperature of 103.2 earlier. Pt reports associated chills, mild nausea, lower abdominal pain and generalized body aches. He has some mild SOB which he states is at baseline. Pt reports no alleviating or exacerbating factors at this time. He denies dysuria, pain with urination, vomiting or diarrhea.   Past Medical History  Diagnosis Date  . Kidney stones   . Leg swelling   . Low back pain   . Diabetes mellitus   . Peripheral neuropathy   . Obese   . Hypertension   . Myocardial infarction 12/09/1993  . CHF (congestive heart failure)   . Cancer     prostate  (Radiation only)   Past Surgical History  Procedure Laterality Date  . Pacemaker placement    . Joint replacement  2004    right total knee replacement  . Coronary angioplasty with stent placement  1995     x 3 stents  . Multiple tooth extractions      Total Tooth extraction   Family History  Problem Relation Age of Onset  . Diabetes Other   . Hypertension Other   . Heart disease Other   . Arthritis Other    History  Substance Use Topics  . Smoking status: Former Smoker    Types: Cigarettes    Quit date: 10/23/1980  . Smokeless tobacco: Not on file  . Alcohol Use: No    Review of Systems  A complete 10 system review of systems was obtained and all systems are negative except as noted in the HPI and PMH.   Allergies  Review of patient's allergies indicates no known allergies.  Home Medications   Prior to Admission  medications   Medication Sig Start Date End Date Taking? Authorizing Provider  albuterol (PROVENTIL HFA;VENTOLIN HFA) 108 (90 BASE) MCG/ACT inhaler Inhale 2 puffs into the lungs every 4 (four) hours as needed for wheezing or shortness of breath. 08/24/13   Orpah Greek, MD  ALBUTEROL IN Inhale 90 mcg into the lungs as needed.     Historical Provider, MD  amoxicillin-clavulanate (AUGMENTIN) 875-125 MG per tablet Take 1 tablet by mouth 2 (two) times daily. 08/24/13   Orpah Greek, MD  atorvastatin (LIPITOR) 10 MG tablet Take 10 mg by mouth daily.    Historical Provider, MD  benzonatate (TESSALON) 100 MG capsule Take 1 capsule (100 mg total) by mouth every 8 (eight) hours. 08/24/13   Orpah Greek, MD  cephALEXin (KEFLEX) 500 MG capsule Take 1 capsule (500 mg total) by mouth 4 (four) times daily. 09/26/13   Fransico Meadow, PA-C  cephALEXin (KEFLEX) 500 MG capsule Take 1 capsule (500 mg total) by mouth 4 (four) times daily. 09/26/13   Fransico Meadow, PA-C  citalopram (CELEXA) 10 MG tablet Take 20 mg by mouth daily.     Historical Provider, MD  Furosemide (LASIX PO) Take 40 mg by mouth daily.     Historical Provider, MD  GABAPENTIN PO Take 300 mg by mouth 3 (three) times daily.     Historical Provider, MD  HYDROcodone-acetaminophen (NORCO/VICODIN) 5-325 MG per tablet Take 2 tablets by mouth every 4 (four) hours as needed. 09/26/13   Fransico Meadow, PA-C  HYDROCODONE-ACETAMINOPHEN PO Take by mouth. 5/500 mg     Take one tablet four times a day as needed for pain    Historical Provider, MD  insulin NPH-insulin regular (NOVOLIN 70/30) (70-30) 100 UNIT/ML injection Inject into the skin.      Historical Provider, MD  LISINOPRIL PO Take 20 mg by mouth. One half tablet two times a day    Historical Provider, MD  Menthol-Methyl Salicylate 9-41 % CREA Apply topically.      Historical Provider, MD  METHADONE HCL PO Take 10 mg by mouth 4 (four) times daily.     Historical Provider, MD  METOPROLOL  TARTRATE PO Take 25 mg by mouth 2 (two) times daily.     Historical Provider, MD  Multiple Vitamin (MULTIVITAMIN) tablet Take 1 tablet by mouth daily.      Historical Provider, MD  nitroGLYCERIN (NITRODUR - DOSED IN MG/24 HR) 0.2 mg/hr Place 1 patch onto the skin daily. Use in conjunction with 0.6 mg/hr patch     Historical Provider, MD  nitroGLYCERIN (NITRODUR - DOSED IN MG/24 HR) 0.6 mg/hr Place 1 patch onto the skin daily.      Historical Provider, MD  nitroGLYCERIN (NITROSTAT) 0.4 MG SL tablet Place 0.4 mg under the tongue every 5 (five) minutes as needed.      Historical Provider, MD  OMEPRAZOLE PO Take 20 mg by mouth 2 (two) times daily.     Historical Provider, MD  OXYBUTYNIN CHLORIDE PO Take 5 mg by mouth 2 (two) times daily.     Historical Provider, MD  OXYCODONE HCL PO Take 5 mg by mouth 2 (two) times daily as needed.     Historical Provider, MD  oxyCODONE-acetaminophen (PERCOCET/ROXICET) 5-325 MG per tablet Take 2 tablets by mouth every 4 (four) hours as needed for severe pain. 09/26/13   Fransico Meadow, PA-C  phenazopyridine (PYRIDIUM) 200 MG tablet Take 1 tablet (200 mg total) by mouth 3 (three) times daily. 04/10/14   Tanna Furry, MD  SIMVASTATIN PO Take 40 mg by mouth at bedtime.     Historical Provider, MD  sucralfate (CARAFATE) 1 GM/10ML suspension Take 10 mLs (1 g total) by mouth 4 (four) times daily -  with meals and at bedtime. 05/01/14   April Palumbo, MD  terazosin (HYTRIN) 2 MG capsule Take 2 mg by mouth. Take 2  At bedtime     Historical Provider, MD  TRAZODONE HCL PO Take by mouth.      Historical Provider, MD   BP 105/46 mmHg  Pulse 70  Temp(Src) 99.5 F (37.5 C) (Oral)  Resp 22  Ht 5\' 8"  (1.727 m)  Wt 268 lb (121.564 kg)  BMI 40.76 kg/m2  SpO2 95% Physical Exam General: Well-developed, well-nourished male in no acute distress; appearance consistent with age of record. Ambulates with crutch.  HENT: normocephalic; atraumatic. Edentulous. Pharynx normal.  Eyes: pupils  equal, round and reactive to light; extraocular muscles intact Neck: supple Heart: regular rate and rhythm; no murmurs, rubs or gallops Lungs: clear to auscultation bilaterally Abdomen: soft; nondistended; nontender; no masses or hepatosplenomegaly; bowel sounds presentSuprapubic tenderness.  Extremities: No deformity; full range of motion; pulses normal. +1 pitting edema of lower legs.  Neurologic: Awake, alert and oriented;  motor function intact in all extremities and symmetric; no facial droop Skin: Warm and dry Psychiatric: Normal mood and affect  ED Course  Procedures   DIAGNOSTIC STUDIES: Oxygen Saturation is 94% on RA, normal by my interpretation.    COORDINATION OF CARE: 12:43 AM Discussed treatment plan with pt at bedside and pt agreed to plan.    MDM   Nursing notes and vitals signs, including pulse oximetry, reviewed.  Summary of this visit's results, reviewed by myself:  Labs:  Results for orders placed or performed during the hospital encounter of 06/19/14 (from the past 24 hour(s))  POC CBG, ED     Status: Abnormal   Collection Time: 06/18/14 10:38 PM  Result Value Ref Range   Glucose-Capillary 137 (H) 70 - 99 mg/dL  CBC with Differential/Platelet     Status: Abnormal   Collection Time: 06/19/14 12:47 AM  Result Value Ref Range   WBC 10.3 4.0 - 10.5 K/uL   RBC 3.56 (L) 4.22 - 5.81 MIL/uL   Hemoglobin 12.2 (L) 13.0 - 17.0 g/dL   HCT 34.2 (L) 39.0 - 52.0 %   MCV 96.1 78.0 - 100.0 fL   MCH 34.3 (H) 26.0 - 34.0 pg   MCHC 35.7 30.0 - 36.0 g/dL   RDW 15.8 (H) 11.5 - 15.5 %   Platelets 181 150 - 400 K/uL   Neutrophils Relative % 93 (H) 43 - 77 %   Neutro Abs 9.6 (H) 1.7 - 7.7 K/uL   Lymphocytes Relative 4 (L) 12 - 46 %   Lymphs Abs 0.4 (L) 0.7 - 4.0 K/uL   Monocytes Relative 2 (L) 3 - 12 %   Monocytes Absolute 0.2 0.1 - 1.0 K/uL   Eosinophils Relative 1 0 - 5 %   Eosinophils Absolute 0.1 0.0 - 0.7 K/uL   Basophils Relative 0 0 - 1 %   Basophils Absolute 0.0  0.0 - 0.1 K/uL  Basic metabolic panel     Status: Abnormal   Collection Time: 06/19/14 12:47 AM  Result Value Ref Range   Sodium 136 135 - 145 mmol/L   Potassium 3.3 (L) 3.5 - 5.1 mmol/L   Chloride 107 101 - 111 mmol/L   CO2 23 22 - 32 mmol/L   Glucose, Bld 185 (H) 70 - 99 mg/dL   BUN 11 6 - 20 mg/dL   Creatinine, Ser 0.93 0.61 - 1.24 mg/dL   Calcium 8.7 (L) 8.9 - 10.3 mg/dL   GFR calc non Af Amer >60 >60 mL/min   GFR calc Af Amer >60 >60 mL/min   Anion gap 6 5 - 15  Urinalysis, Routine w reflex microscopic     Status: Abnormal   Collection Time: 06/19/14 12:52 AM  Result Value Ref Range   Color, Urine YELLOW YELLOW   APPearance CLEAR CLEAR   Specific Gravity, Urine 1.014 1.005 - 1.030   pH 5.5 5.0 - 8.0   Glucose, UA NEGATIVE NEGATIVE mg/dL   Hgb urine dipstick NEGATIVE NEGATIVE   Bilirubin Urine NEGATIVE NEGATIVE   Ketones, ur NEGATIVE NEGATIVE mg/dL   Protein, ur NEGATIVE NEGATIVE mg/dL   Urobilinogen, UA 1.0 0.0 - 1.0 mg/dL   Nitrite POSITIVE (A) NEGATIVE   Leukocytes, UA TRACE (A) NEGATIVE  Urine microscopic-add on     Status: Abnormal   Collection Time: 06/19/14 12:52 AM  Result Value Ref Range   Squamous Epithelial / LPF RARE RARE   WBC, UA 7-10 <3 WBC/hpf   RBC / HPF 0-2 <3 RBC/hpf  Bacteria, UA MANY (A) RARE   Casts HYALINE CASTS (A) NEGATIVE  I-Stat CG4 Lactic Acid, ED     Status: Abnormal   Collection Time: 06/19/14 12:56 AM  Result Value Ref Range   Lactic Acid, Venous 2.19 (HH) 0.5 - 2.0 mmol/L   Comment NOTIFIED PHYSICIAN     Imaging Studies: Dg Chest 2 View  06/18/2014   CLINICAL DATA:  Fever, short of breath, body ache  EXAM: CHEST  2 VIEW  COMPARISON:  04/30/2014, CT 05/01/2014  FINDINGS: Left-sided pacemaker overlies normal cardiac silhouette. There is interval increase in central venous pulmonary congestion and mild interstitial edema. Pleural thickening along the right lateral hemi thorax is not changed from comparison CT. No pneumothorax. No focal  consolidation.  IMPRESSION: Increase in central venous congestion and interstitial edema.   Electronically Signed   By: Suzy Bouchard M.D.   On: 06/18/2014 23:10   3:38 AM Rocephin IV given for urinary tract infection. The patient does not appear septic at this time. We will discharge him home on oral antibiotics.  I personally performed the services described in this documentation, which was scribed in my presence. The recorded information has been reviewed and is accurate.   Shanon Rosser, MD 06/19/14 [phone]

## 2014-06-19 NOTE — ED Notes (Signed)
ED provider made aware of delay of 2nd blood culture and delay in starting ZOSYN.

## 2014-06-19 NOTE — H&P (Signed)
Johnny Ochoa is an 69 y.o. male.    pcp unassigned  Chief Complaint: chills HPI: 69 yo male with dm2, neuroapthy, CHF (EF?), apparently dx with uti at [place] and then blood culture today from that visit grew gram negative rods, and pt was asked to go to ED.  At [facility], pt seen and was febrile and bp somewhat low,  Pt admits to dysuria, denies heamturia, flank pain, n/v, diarrhea, brbpr, black stool.  Pt will be admitted for urosepsis.      Past Medical History  Diagnosis Date  . Kidney stones   . Leg swelling   . Low back pain   . Diabetes mellitus   . Peripheral neuropathy   . Obese   . Hypertension   . Myocardial infarction 12/09/1993  . CHF (congestive heart failure)   . Cancer     prostate  (Radiation only)  . PTSD (post-traumatic stress disorder)   . OSA (obstructive sleep apnea)     Past Surgical History  Procedure Laterality Date  . Pacemaker placement    . Joint replacement  2004    right total knee replacement  . Coronary angioplasty with stent placement  1995     x 3 stents  . Multiple tooth extractions      Total Tooth extraction    Family History  Problem Relation Age of Onset  . Diabetes Other   . Hypertension Other   . Heart disease Other   . Arthritis Other   . Heart attack Mother    Social History:  reports that he quit smoking about 33 years ago. His smoking use included Cigarettes. He has a 20 pack-year smoking history. He does not have any smokeless tobacco history on file. He reports that he does not drink alcohol or use illicit drugs.  Allergies: No Known Allergies Medication reviewed   (Not in a hospital admission)  Results for orders placed or performed during the hospital encounter of 06/19/14 (from the past 48 hour(s))  CBC with Differential     Status: Abnormal   Collection Time: 06/19/14  8:40 PM  Result Value Ref Range   WBC 9.2 4.0 - 10.5 K/uL   RBC 3.23 (L) 4.22 - 5.81 MIL/uL   Hemoglobin 10.7 (L) 13.0 - 17.0 g/dL   HCT  31.5 (L) 39.0 - 52.0 %   MCV 97.5 78.0 - 100.0 fL   MCH 33.1 26.0 - 34.0 pg   MCHC 34.0 30.0 - 36.0 g/dL   RDW 16.2 (H) 11.5 - 15.5 %   Platelets 151 150 - 400 K/uL   Neutrophils Relative % 88 (H) 43 - 77 %   Neutro Abs 8.1 (H) 1.7 - 7.7 K/uL   Lymphocytes Relative 6 (L) 12 - 46 %   Lymphs Abs 0.6 (L) 0.7 - 4.0 K/uL   Monocytes Relative 6 3 - 12 %   Monocytes Absolute 0.5 0.1 - 1.0 K/uL   Eosinophils Relative 0 0 - 5 %   Eosinophils Absolute 0.0 0.0 - 0.7 K/uL   Basophils Relative 0 0 - 1 %   Basophils Absolute 0.0 0.0 - 0.1 K/uL  Comprehensive metabolic panel     Status: Abnormal   Collection Time: 06/19/14  8:40 PM  Result Value Ref Range   Sodium 138 135 - 145 mmol/L   Potassium 3.6 3.5 - 5.1 mmol/L   Chloride 105 101 - 111 mmol/L   CO2 26 22 - 32 mmol/L   Glucose, Bld 211 (  H) 70 - 99 mg/dL   BUN 19 6 - 20 mg/dL   Creatinine, Ser 1.24 0.61 - 1.24 mg/dL   Calcium 8.4 (L) 8.9 - 10.3 mg/dL   Total Protein 6.7 6.5 - 8.1 g/dL   Albumin 3.3 (L) 3.5 - 5.0 g/dL   AST 34 15 - 41 U/L   ALT 22 17 - 63 U/L   Alkaline Phosphatase 58 38 - 126 U/L   Total Bilirubin 1.5 (H) 0.3 - 1.2 mg/dL   GFR calc non Af Amer 58 (L) >60 mL/min   GFR calc Af Amer >60 >60 mL/min    Comment: (NOTE) The eGFR has been calculated using the CKD EPI equation. This calculation has not been validated in all clinical situations. eGFR's persistently <90 mL/min signify possible Chronic Kidney Disease.    Anion gap 7 5 - 15  I-stat troponin, ED     Status: None   Collection Time: 06/19/14  8:55 PM  Result Value Ref Range   Troponin i, poc 0.04 0.00 - 0.08 ng/mL   Comment 3            Comment: Due to the release kinetics of cTnI, a negative result within the first hours of the onset of symptoms does not rule out myocardial infarction with certainty. If myocardial infarction is still suspected, repeat the test at appropriate intervals.   I-Stat CG4 Lactic Acid, ED     Status: None   Collection Time:  06/19/14  8:57 PM  Result Value Ref Range   Lactic Acid, Venous 1.81 0.5 - 2.0 mmol/L  Urinalysis, Routine w reflex microscopic     Status: Abnormal   Collection Time: 06/19/14  9:58 PM  Result Value Ref Range   Color, Urine AMBER (A) YELLOW    Comment: BIOCHEMICALS MAY BE AFFECTED BY COLOR   APPearance CLEAR CLEAR   Specific Gravity, Urine 1.024 1.005 - 1.030   pH 5.5 5.0 - 8.0   Glucose, UA NEGATIVE NEGATIVE mg/dL   Hgb urine dipstick NEGATIVE NEGATIVE   Bilirubin Urine SMALL (A) NEGATIVE   Ketones, ur NEGATIVE NEGATIVE mg/dL   Protein, ur NEGATIVE NEGATIVE mg/dL   Urobilinogen, UA 1.0 0.0 - 1.0 mg/dL   Nitrite NEGATIVE NEGATIVE   Leukocytes, UA SMALL (A) NEGATIVE  Urine microscopic-add on     Status: None   Collection Time: 06/19/14  9:58 PM  Result Value Ref Range   WBC, UA 3-6 <3 WBC/hpf   RBC / HPF 0-2 <3 RBC/hpf   Dg Chest 2 View  06/18/2014   CLINICAL DATA:  Fever, short of breath, body ache  EXAM: CHEST  2 VIEW  COMPARISON:  04/30/2014, CT 05/01/2014  FINDINGS: Left-sided pacemaker overlies normal cardiac silhouette. There is interval increase in central venous pulmonary congestion and mild interstitial edema. Pleural thickening along the right lateral hemi thorax is not changed from comparison CT. No pneumothorax. No focal consolidation.  IMPRESSION: Increase in central venous congestion and interstitial edema.   Electronically Signed   By: Suzy Bouchard M.D.   On: 06/18/2014 23:10    Review of Systems  Constitutional: Positive for fever and chills. Negative for weight loss, malaise/fatigue and diaphoresis.  HENT: Negative.   Eyes: Negative.   Respiratory: Positive for shortness of breath.        Dyspnea with exertion  Cardiovascular: Negative.   Gastrointestinal: Negative.   Genitourinary: Positive for urgency and frequency. Negative for dysuria, hematuria and flank pain.       Incontinence,  uses 5 pad per nite, erectile dysfunctino  Musculoskeletal: Negative.    Skin: Negative for itching and rash.  Neurological: Negative.  Negative for weakness.  Endo/Heme/Allergies: Negative.   Psychiatric/Behavioral: Positive for depression and memory loss. Negative for suicidal ideas, hallucinations and substance abuse. The patient is nervous/anxious and has insomnia.     Blood pressure 93/55, pulse 75, temperature 100 F (37.8 C), temperature source Oral, resp. rate 18, SpO2 93 %. Physical Exam  Constitutional: He is oriented to person, place, and time. He appears well-developed and well-nourished.  HENT:  Head: Normocephalic and atraumatic.  Mouth/Throat: No oropharyngeal exudate.  Eyes: Conjunctivae and EOM are normal. Pupils are equal, round, and reactive to light. No scleral icterus.  Neck: Normal range of motion. Neck supple. No JVD present. No tracheal deviation present. No thyromegaly present.  Cardiovascular: Normal rate and regular rhythm.  Exam reveals no gallop and no friction rub.   No murmur heard. Respiratory: Effort normal and breath sounds normal. No respiratory distress. He has no wheezes. He has no rales.  GI: Soft. Bowel sounds are normal. He exhibits no distension. There is no tenderness. There is no rebound and no guarding.  Musculoskeletal: He exhibits edema. He exhibits no tenderness.  Trace edema  Lymphadenopathy:    He has no cervical adenopathy.  Neurological: He is alert and oriented to person, place, and time. He has normal reflexes. He displays normal reflexes. No cranial nerve deficit. He exhibits normal muscle tone. Coordination normal.  + neuropathy  Skin: Skin is warm and dry. No rash noted. No erythema. No pallor.  Psychiatric:  Depression , insomnia, ptsd     Assessment/Plan Uti, urosepsis Rocephin 1gm iv qday Blood culture x2 pending, urine culture pending  Fever secondary to uti Monitor  Anemia Check cbc in am  Dm2 fsbs ac and qhs, iss  DVT prophylaxis: scd, lovenox  Anneta Rounds 06/19/2014, 10:49  PM

## 2014-06-19 NOTE — ED Notes (Signed)
[facility] lab calls this rn to inform that blood cultures, both aerobic and anaerobic, are positive for gram negative rods. Dr. Tamera Punt alerted, after reviewing chart requests that call pt back for iv antibiotics, and suggest that he go to [facility] ED as he will likely be admitted there. Pt verbalizes understanding that based on results of blood cultures, he will need iv antibiotics and likely be admitted to [facility], states he will go there to be triaged asap. I explained to pt that I will call triage and charge nurses and advise them of him coming, but that it may or may not shorten his triage/wait time there. Pt verbalizes understanding.

## 2014-06-19 NOTE — ED Notes (Signed)
Pt seen yesterday for fever and was told to return due to blood culture results. Pt reports he is unsure of what the blood culture results were but he was told by ED to come back.

## 2014-06-19 NOTE — ED Provider Notes (Signed)
CSN: [id number]     Arrival date & time 06/19/14  1926 History   First MD Initiated Contact with Patient 06/19/14 1939     Chief Complaint  Patient presents with  . Fever     (Consider location/radiation/quality/duration/timing/severity/associated sxs/prior Treatment) The history is provided by the patient.  Shrihaan Porzio is a 69 y.o. male hx of DM, obese, HTN, MI, CHF here with fever, possible bacteremia. Patient has been having fever since yesterday. Fever was 103 yesterday. Patient came to the ER last night and had lab work and cultures done. He was diagnosed with urinary tract infection and sent home with antibiotics. White count was 10 and today he was called because blood culture grew out gram-negative rods in both culture tubes. Patient denies any drug use. Patient states that he still has diffuse myalgias and vomiting but denies any abdominal pain. States that he had a flu shot this year.    Past Medical History  Diagnosis Date  . Kidney stones   . Leg swelling   . Low back pain   . Diabetes mellitus   . Peripheral neuropathy   . Obese   . Hypertension   . Myocardial infarction 12/09/1993  . CHF (congestive heart failure)   . Cancer     prostate  (Radiation only)   Past Surgical History  Procedure Laterality Date  . Pacemaker placement    . Joint replacement  2004    right total knee replacement  . Coronary angioplasty with stent placement  1995     x 3 stents  . Multiple tooth extractions      Total Tooth extraction   Family History  Problem Relation Age of Onset  . Diabetes Other   . Hypertension Other   . Heart disease Other   . Arthritis Other    History  Substance Use Topics  . Smoking status: Former Smoker    Types: Cigarettes    Quit date: 10/23/1980  . Smokeless tobacco: Not on file  . Alcohol Use: No    Review of Systems  Constitutional: Positive for fever.  All other systems reviewed and are negative.     Allergies  Review of patient's  allergies indicates no known allergies.  Home Medications   Prior to Admission medications   Medication Sig Start Date End Date Taking? Authorizing Provider  cephALEXin (KEFLEX) 500 MG capsule Take 1 capsule (500 mg total) by mouth 3 (three) times daily. 06/19/14  Yes John Molpus, MD  lidocaine (XYLOCAINE) 2 % jelly Place 1 application rectally as needed (for pain).   Yes Historical Provider, MD  albuterol (PROVENTIL HFA;VENTOLIN HFA) 108 (90 BASE) MCG/ACT inhaler Inhale 2 puffs into the lungs every 4 (four) hours as needed for wheezing or shortness of breath. 08/24/13   Orpah Greek, MD  ALBUTEROL IN Inhale 90 mcg into the lungs as needed.     Historical Provider, MD  amoxicillin-clavulanate (AUGMENTIN) 875-125 MG per tablet Take 1 tablet by mouth 2 (two) times daily. 08/24/13   Orpah Greek, MD  atorvastatin (LIPITOR) 10 MG tablet Take 10 mg by mouth daily.    Historical Provider, MD  benzonatate (TESSALON) 100 MG capsule Take 1 capsule (100 mg total) by mouth every 8 (eight) hours. 08/24/13   Orpah Greek, MD  citalopram (CELEXA) 10 MG tablet Take 20 mg by mouth daily.     Historical Provider, MD  Furosemide (LASIX PO) Take 40 mg by mouth daily.     Historical Provider,  MD  GABAPENTIN PO Take 300 mg by mouth 3 (three) times daily.     Historical Provider, MD  HYDROcodone-acetaminophen (NORCO/VICODIN) 5-325 MG per tablet Take 2 tablets by mouth every 4 (four) hours as needed. 09/26/13   Fransico Meadow, PA-C  HYDROCODONE-ACETAMINOPHEN PO Take by mouth. 5/500 mg     Take one tablet four times a day as needed for pain    Historical Provider, MD  insulin NPH-insulin regular (NOVOLIN 70/30) (70-30) 100 UNIT/ML injection Inject into the skin.      Historical Provider, MD  LISINOPRIL PO Take 20 mg by mouth. One half tablet two times a day    Historical Provider, MD  Menthol-Methyl Salicylate 2-87 % CREA Apply topically.      Historical Provider, MD  METHADONE HCL PO Take 10 mg by  mouth 4 (four) times daily.     Historical Provider, MD  METOPROLOL TARTRATE PO Take 25 mg by mouth 2 (two) times daily.     Historical Provider, MD  Multiple Vitamin (MULTIVITAMIN) tablet Take 1 tablet by mouth daily.      Historical Provider, MD  nitroGLYCERIN (NITRODUR - DOSED IN MG/24 HR) 0.2 mg/hr Place 1 patch onto the skin daily. Use in conjunction with 0.6 mg/hr patch     Historical Provider, MD  nitroGLYCERIN (NITRODUR - DOSED IN MG/24 HR) 0.6 mg/hr Place 1 patch onto the skin daily.      Historical Provider, MD  nitroGLYCERIN (NITROSTAT) 0.4 MG SL tablet Place 0.4 mg under the tongue every 5 (five) minutes as needed.      Historical Provider, MD  OMEPRAZOLE PO Take 20 mg by mouth 2 (two) times daily.     Historical Provider, MD  OXYBUTYNIN CHLORIDE PO Take 5 mg by mouth 2 (two) times daily.     Historical Provider, MD  OXYCODONE HCL PO Take 5 mg by mouth 2 (two) times daily as needed.     Historical Provider, MD  oxyCODONE-acetaminophen (PERCOCET/ROXICET) 5-325 MG per tablet Take 2 tablets by mouth every 4 (four) hours as needed for severe pain. 09/26/13   Fransico Meadow, PA-C  phenazopyridine (PYRIDIUM) 200 MG tablet Take 1 tablet (200 mg total) by mouth 3 (three) times daily. 04/10/14   Tanna Furry, MD  SIMVASTATIN PO Take 40 mg by mouth at bedtime.     Historical Provider, MD  sucralfate (CARAFATE) 1 GM/10ML suspension Take 10 mLs (1 g total) by mouth 4 (four) times daily -  with meals and at bedtime. 05/01/14   April Palumbo, MD  terazosin (HYTRIN) 2 MG capsule Take 2 mg by mouth. Take 2  At bedtime     Historical Provider, MD  TRAZODONE HCL PO Take by mouth.      Historical Provider, MD   BP 104/40 mmHg  Pulse 58  Temp(Src) 102.2 F (39 C) (Oral)  Resp 18  SpO2 95% Physical Exam  Constitutional: He is oriented to person, place, and time.  Ill appearing   HENT:  Head: Normocephalic.  MM slightly dry   Eyes: Conjunctivae are normal. Pupils are equal, round, and reactive to light.   Neck: Normal range of motion. Neck supple.  Cardiovascular: Regular rhythm.   ? 2/6 systolic murmur   Pulmonary/Chest: Effort normal and breath sounds normal. No respiratory distress. He has no wheezes. He has no rales.  Abdominal: Soft. Bowel sounds are normal. He exhibits no distension. There is no tenderness. There is no rebound.  Musculoskeletal: Normal range of motion. He  exhibits no edema or tenderness.  Neurological: He is alert and oriented to person, place, and time. No cranial nerve deficit. Coordination normal.  Skin: Skin is warm and dry.  Psychiatric: He has a normal mood and affect. His behavior is normal. Judgment and thought content normal.  Nursing note and vitals reviewed.   ED Course  Procedures (including critical care time) Labs Review Labs Reviewed  CBC WITH DIFFERENTIAL/PLATELET - Abnormal; Notable for the following:    RBC 3.23 (*)    Hemoglobin 10.7 (*)    HCT 31.5 (*)    RDW 16.2 (*)    Neutrophils Relative % 88 (*)    Neutro Abs 8.1 (*)    Lymphocytes Relative 6 (*)    Lymphs Abs 0.6 (*)    All other components within normal limits  COMPREHENSIVE METABOLIC PANEL - Abnormal; Notable for the following:    Glucose, Bld 211 (*)    Calcium 8.4 (*)    Albumin 3.3 (*)    Total Bilirubin 1.5 (*)    GFR calc non Af Amer 58 (*)    All other components within normal limits  CULTURE, BLOOD (ROUTINE X 2)  CULTURE, BLOOD (ROUTINE X 2)  URINE CULTURE  URINALYSIS, ROUTINE W REFLEX MICROSCOPIC  INFLUENZA PANEL BY PCR (TYPE A & B, H1N1)  I-STAT CG4 LACTIC ACID, ED  Randolm Idol, ED    Imaging Review Dg Chest 2 View  06/18/2014   CLINICAL DATA:  Fever, short of breath, body ache  EXAM: CHEST  2 VIEW  COMPARISON:  04/30/2014, CT 05/01/2014  FINDINGS: Left-sided pacemaker overlies normal cardiac silhouette. There is interval increase in central venous pulmonary congestion and mild interstitial edema. Pleural thickening along the right lateral hemi thorax is not  changed from comparison CT. No pneumothorax. No focal consolidation.  IMPRESSION: Increase in central venous congestion and interstitial edema.   Electronically Signed   By: Suzy Bouchard M.D.   On: 06/18/2014 23:10     EKG Interpretation None      MDM   Final diagnoses:  None    Lemuel Boodram is a 69 y.o. male here with fever with GNR sepsis likely from UTI. Will repeat labs and cultures. Also consider endocarditis given possible murmur. Will give zosyn for now. Will need admission.   10:00 PM Wbc nl. Chemistry unremarkable. Given zosyn. Will admit.     Wandra Arthurs, MD 06/19/14 2201

## 2014-06-19 NOTE — ED Notes (Signed)
Charge nurse Hasty called and made aware that pt plans to check in to ed, results of blood cultures and dr. Mollie Germany prediction that he will likely need iv antibiotics and inpatient admission.

## 2014-06-19 NOTE — Discharge Instructions (Signed)

## 2014-06-20 DIAGNOSIS — R7881 Bacteremia: Secondary | ICD-10-CM

## 2014-06-20 LAB — CBC
HCT: 29.7 % — ABNORMAL LOW (ref 39.0–52.0)
Hemoglobin: 9.8 g/dL — ABNORMAL LOW (ref 13.0–17.0)
MCH: 32.2 pg (ref 26.0–34.0)
MCHC: 33 g/dL (ref 30.0–36.0)
MCV: 97.7 fL (ref 78.0–100.0)
Platelets: 137 10*3/uL — ABNORMAL LOW (ref 150–400)
RBC: 3.04 MIL/uL — ABNORMAL LOW (ref 4.22–5.81)
RDW: 16.5 % — ABNORMAL HIGH (ref 11.5–15.5)
WBC: 6.6 10*3/uL (ref 4.0–10.5)

## 2014-06-20 LAB — URINE CULTURE
Colony Count: NO GROWTH
Culture: NO GROWTH

## 2014-06-20 LAB — COMPREHENSIVE METABOLIC PANEL
ALT: 22 U/L (ref 17–63)
AST: 36 U/L (ref 15–41)
Albumin: 2.9 g/dL — ABNORMAL LOW (ref 3.5–5.0)
Alkaline Phosphatase: 56 U/L (ref 38–126)
Anion gap: 8 (ref 5–15)
BUN: 18 mg/dL (ref 6–20)
CO2: 27 mmol/L (ref 22–32)
Calcium: 8.3 mg/dL — ABNORMAL LOW (ref 8.9–10.3)
Chloride: 105 mmol/L (ref 101–111)
Creatinine, Ser: 1.24 mg/dL (ref 0.61–1.24)
GFR calc Af Amer: >60 mL/min (ref >60)
GFR calc non Af Amer: 58 mL/min — ABNORMAL LOW (ref >60)
Glucose, Bld: 139 mg/dL — ABNORMAL HIGH (ref 70–99)
Potassium: 3.5 mmol/L (ref 3.5–5.1)
Sodium: 140 mmol/L (ref 135–145)
Total Bilirubin: 1.9 mg/dL — ABNORMAL HIGH (ref 0.3–1.2)
Total Protein: 6.4 g/dL — ABNORMAL LOW (ref 6.5–8.1)

## 2014-06-20 LAB — INFLUENZA PANEL BY PCR (TYPE A & B)
H1N1 flu by pcr: NOT DETECTED
Influenza A By PCR: NEGATIVE
Influenza B By PCR: NEGATIVE

## 2014-06-20 LAB — GLUCOSE, CAPILLARY
Glucose-Capillary: 156 mg/dL — ABNORMAL HIGH (ref 70–99)
Glucose-Capillary: 204 mg/dL — ABNORMAL HIGH (ref 70–99)
Glucose-Capillary: 181 mg/dL — ABNORMAL HIGH (ref 70–99)
Glucose-Capillary: 262 mg/dL — ABNORMAL HIGH (ref 70–99)

## 2014-06-20 MED ORDER — ENSURE ENLIVE PO LIQD
237.0000 mL | Freq: Two times a day (BID) | ORAL | Status: DC
  Administered 2014-06-20 – 2014-06-23 (×7): 237 mL via ORAL

## 2014-06-20 MED ORDER — INSULIN GLARGINE 100 UNIT/ML ~~LOC~~ SOLN
15.0000 [IU] | Freq: Every day | SUBCUTANEOUS | Status: DC
  Administered 2014-06-20 – 2014-06-22 (×3): 15 [IU] via SUBCUTANEOUS
  Filled 2014-06-20 (×3): qty 0.15

## 2014-06-20 MED ORDER — CEFTRIAXONE SODIUM IN DEXTROSE 20 MG/ML IV SOLN
1.0000 g | INTRAVENOUS | Status: DC
  Administered 2014-06-20: 1 g via INTRAVENOUS
  Filled 2014-06-20: qty 50

## 2014-06-20 NOTE — Progress Notes (Signed)
PROGRESS NOTE  Johnny Ochoa LSL:[id number] DOB: [birth year] DOA: 06/19/2014 PCP: No PCP Per Patient  HPI: 69 yo male with dm2, neuroapthy, CHF (EF?), apparently dx with uti at [place] and then blood culture today from that visit grew gram negative rods, and pt was asked to go to ED  Subjective / 24 H Interval events - feeling better this morning, denies chills - no chest pain, abdominal pain, no nausea/vomiting. Eating well.   Assessment/Plan: Active Problems:   Anemia   Bacteremia   UTI (lower urinary tract infection)   Diabetes   Sepsis due to UTI - febrile to 102.2 on admission, hypotension, and proven GNR bacteremia - empiric Ceftriaxone, afebrile this morning - cultures with E coli, speciation pending  Anemia - stable, a bit dilutional this morning  DM2 - restart Lantus, continue SSI  HLD - continue Lipitor  Diabetic neuropathy - continue Neurontin  HTN  - continue Lisinopril, Lasix, Metoprolol    Diet: Diet Heart Room service appropriate?: Yes; Fluid consistency:: Thin Fluids: NS DVT Prophylaxis: Lovenox  Code Status: Full Code Family Communication: no family bedside  Disposition Plan: home likely 5/6 if afebrile  Consultants:  None   Procedures:  None    Antibiotics Ceftriaxone 5/3 >>   Studies  Dg Chest 2 View  06/18/2014   CLINICAL DATA:  Fever, short of breath, body ache  EXAM: CHEST  2 VIEW  COMPARISON:  04/30/2014, CT 05/01/2014  FINDINGS: Left-sided pacemaker overlies normal cardiac silhouette. There is interval increase in central venous pulmonary congestion and mild interstitial edema. Pleural thickening along the right lateral hemi thorax is not changed from comparison CT. No pneumothorax. No focal consolidation.  IMPRESSION: Increase in central venous congestion and interstitial edema.   Electronically Signed   By: Suzy Bouchard M.D.   On: 06/18/2014 23:10    Objective  Filed Vitals:   06/20/14 0532 06/20/14 1036  06/20/14 1459 06/20/14 1608  BP: 91/50 116/62  103/58  Pulse: 69 72  72  Temp: 99.3 F (37.4 C)   97.9 F (36.6 C)  TempSrc: Oral   Oral  Resp: 20   16  Height:   5\' 8"  (1.727 m)   Weight: 120.112 kg (264 lb 12.8 oz)  120.1 kg (264 lb 12.4 oz)   SpO2: 94%   99%    Intake/Output Summary (Last 24 hours) at 06/20/14 1807 Last data filed at 06/20/14 1700  Gross per 24 hour  Intake      0 ml  Output   1075 ml  Net  -1075 ml   Filed Weights   06/19/14 2346 06/20/14 0532 06/20/14 1459  Weight: 121.383 kg (267 lb 9.6 oz) 120.112 kg (264 lb 12.8 oz) 120.1 kg (264 lb 12.4 oz)    Exam:  General:  NAD  HEENT: no scleral icterus, PERRL  Cardiovascular: RRR without MRG, 2+ peripheral pulses, no edema  Respiratory: CTA biL, good air movement, no wheezing, no crackles, no rales  Abdomen: soft, non tender, BS +, no guarding  MSK/Extremities: no clubbing/cyanosis, no joint swelling  Skin: no rashes  Neuro: non focal  Data Reviewed: Basic Metabolic Panel:  Recent Labs Lab 06/19/14 0047 06/19/14 2040 06/20/14 0505  NA 136 138 140  K 3.3* 3.6 3.5  CL 107 105 105  CO2 23 26 27   GLUCOSE 185* 211* 139*  BUN 11 19 18   CREATININE 0.93 1.24 1.24  CALCIUM 8.7* 8.4* 8.3*   Liver Function Tests:  Recent Labs  Lab 06/19/14 2040 06/20/14 0505  AST 34 36  ALT 22 22  ALKPHOS 58 56  BILITOT 1.5* 1.9*  PROT 6.7 6.4*  ALBUMIN 3.3* 2.9*   CBC:  Recent Labs Lab 06/19/14 0047 06/19/14 2040 06/20/14 0505  WBC 10.3 9.2 6.6  NEUTROABS 9.6* 8.1*  --   HGB 12.2* 10.7* 9.8*  HCT 34.2* 31.5* 29.7*  MCV 96.1 97.5 97.7  PLT 181 151 137*   BNP (last 3 results)  Recent Labs  04/10/14 1520  BNP 90.0    ProBNP (last 3 results)  Recent Labs  11/30/13 1130  PROBNP 378.2*    CBG:  Recent Labs Lab 06/18/14 2238 06/19/14 2336 06/20/14 0742 06/20/14 1208 06/20/14 1652  GLUCAP 137* 155* 156* 204* 181*    Recent Results (from the past 240 hour(s))  Blood culture  (routine x 2)     Status: None (Preliminary result)   Collection Time: 06/19/14 12:45 AM  Result Value Ref Range Status   Specimen Description BLOOD LEFT ANTECUBITAL  Final   Special Requests   Final    BOTTLES DRAWN AEROBIC AND ANAEROBIC AER 6cc,ANA 6cc   Culture   Final    ESCHERICHIA COLI Note: Gram Stain Report Called to,Read Back By and Verified With: AMY BURNS CHARGE NURSE ON 5.3.2016 AT 6:05P BY WILEJ Performed at [facility]    Report Status PENDING  Incomplete  Blood culture (routine x 2)     Status: None (Preliminary result)   Collection Time: 06/19/14 12:45 AM  Result Value Ref Range Status   Specimen Description BLOOD RIGHT HAND  Final   Special Requests   Final    BOTTLES DRAWN AEROBIC AND ANAEROBIC AER 5cc,ANA 5cc   Culture   Final    ESCHERICHIA COLI Note: Gram Stain Report Called to,Read Back By and Verified With: AMY BURNS ON 5.3.16 AT 6:05P BY WILEJ Performed at [facility]    Report Status PENDING  Incomplete  Urine culture     Status: None (Preliminary result)   Collection Time: 06/19/14 12:52 AM  Result Value Ref Range Status   Specimen Description URINE, CATHETERIZED  Final   Special Requests NONE  Final   Colony Count   Final    >=100,000 COLONIES/ML Performed at [facility]    Culture   Final    ESCHERICHIA COLI Performed at [facility]    Report Status PENDING  Incomplete     Scheduled Meds: . atorvastatin  10 mg Oral q1800  . cefTRIAXone (ROCEPHIN)  IV  1 g Intravenous Q24H  . enoxaparin (LOVENOX) injection  40 mg Subcutaneous QHS  . feeding supplement (ENSURE ENLIVE)  237 mL Oral BID BM  . furosemide  40 mg Oral BID  . gabapentin  300 mg Oral TID  . insulin aspart  0-5 Units Subcutaneous QHS  . insulin aspart  0-9 Units Subcutaneous TID WC  . lisinopril  10 mg Oral Daily  . methadone  10 mg Oral 4 times per day  . metoprolol tartrate  25 mg Oral BID  . multivitamin with minerals  1 tablet Oral Daily  .  nitroGLYCERIN  0.2 mg Transdermal Daily  . nitroGLYCERIN  0.6 mg Transdermal Daily  . pantoprazole  40 mg Oral Daily  . sodium chloride  3 mL Intravenous Q12H  . terazosin  4 mg Oral QHS   Continuous Infusions:   Time spent - 25 minutes   Marzetta Board, MD [facility] Pager [phone]. If  7 PM - 7 AM, please contact night-coverage at [email or web address], password [facility] 06/20/2014, 6:07 PM  LOS: 1 day

## 2014-06-20 NOTE — Progress Notes (Signed)
Initial Nutrition Assessment  DOCUMENTATION CODES:  Obesity unspecified  INTERVENTION: - Continue current diet  NUTRITION DIAGNOSIS:  Inadequate oral intake related to other (see comment) (decreased appetite) as evidenced by per patient/family report.  GOAL: Pt to meet  >/= 90% estimated needs  MONITOR: PO intake, weight trends, labs, I/O's  REASON FOR ASSESSMENT:  Malnutrition Screening Tool    ASSESSMENT: Pt with hx of kidney stones, UTI, DM, HTN, MI, CHF, prostate cancer.  Pt seen for MST. Pt reports fair appetite that fluctuates for a few months PTA. He states that some days he simply does not feel hungry but does not have abdominal pain or nausea. He states that he has ill-fitting dentures and this makes chewing difficult at times. Pt reports UBW fluctuates but is typically between 265-275 lbs. Pt eating lunch at time of visit and had consumed ~50% of salad with tomatoes and cucumber and fish.  Labs and medications reviewed. Physical assessment does not show muscle or fat wasting but does show mild RLE and moderate LLE edema.   Height:  Ht Readings from Last 1 Encounters:  06/20/14 5\' 8"  (1.727 m)    Weight:  Wt Readings from Last 1 Encounters:  06/20/14 264 lb 12.4 oz (120.1 kg)    Ideal Body Weight:  70 kg  Wt Readings from Last 10 Encounters:  06/20/14 264 lb 12.4 oz (120.1 kg)  04/30/14 255 lb (115.667 kg)  04/10/14 259 lb (117.482 kg)  11/30/13 273 lb (123.832 kg)  08/24/13 278 lb (126.1 kg)  10/24/10 270 lb (122.471 kg)    BMI:  Body mass index is 40.27 kg/(m^2).  Estimated Nutritional Needs:  Kcal:  1800-2000  Protein:  100-110  Fluid:  1.5-2L  Skin:  Reviewed, no issues  Diet Order:  Diet Heart Room service appropriate?: Yes; Fluid consistency:: Thin  EDUCATION NEEDS:      Intake/Output Summary (Last 24 hours) at 06/20/14 1509 Last data filed at 06/20/14 0543  Gross per 24 hour  Intake      0 ml  Output    275 ml  Net   -275  ml    Last BM:  PTA    Jarome Matin, RD, LDN Inpatient Clinical Dietitian Pager # [phone] After hours/weekend pager # [phone]

## 2014-06-20 NOTE — Care Management Note (Signed)
Case Management Note  Patient Details  Name: Johnny Ochoa MRN: [id number] Date of Birth: Jun 26, 1945  Subjective/Objective:    69 y/o m admitted w/bacteremia,uti.                Action/Plan:From home.   Expected Discharge Date:  06/25/14               Expected Discharge Plan:  Home/Self Care  In-House Referral:     Discharge planning Services  CM Consult  Post Acute Care Choice:    Choice offered to:     DME Arranged:    DME Agency:     HH Arranged:    HH Agency:     Status of Service:  In process, will continue to follow  Medicare Important Message Given:    Date Medicare IM Given:    Medicare IM give by:    Date Additional Medicare IM Given:    Additional Medicare Important Message give by:     If discussed at [place] of Stay Meetings, dates discussed:    Additional Comments:No anticipated d/c needs.  Dessa Phi, RN 06/20/2014, 3:11 PM

## 2014-06-21 LAB — CULTURE, BLOOD (ROUTINE X 2)

## 2014-06-21 LAB — GLUCOSE, CAPILLARY
Glucose-Capillary: 116 mg/dL — ABNORMAL HIGH (ref 70–99)
Glucose-Capillary: 233 mg/dL — ABNORMAL HIGH (ref 70–99)
Glucose-Capillary: 227 mg/dL — ABNORMAL HIGH (ref 70–99)
Glucose-Capillary: 192 mg/dL — ABNORMAL HIGH (ref 70–99)

## 2014-06-21 LAB — COMPREHENSIVE METABOLIC PANEL
ALT: 24 U/L (ref 17–63)
AST: 29 U/L (ref 15–41)
Albumin: 2.9 g/dL — ABNORMAL LOW (ref 3.5–5.0)
Alkaline Phosphatase: 62 U/L (ref 38–126)
Anion gap: 6 (ref 5–15)
BUN: 16 mg/dL (ref 6–20)
CO2: 28 mmol/L (ref 22–32)
Calcium: 8.3 mg/dL — ABNORMAL LOW (ref 8.9–10.3)
Chloride: 104 mmol/L (ref 101–111)
Creatinine, Ser: 0.84 mg/dL (ref 0.61–1.24)
GFR calc Af Amer: >60 mL/min (ref >60)
GFR calc non Af Amer: >60 mL/min (ref >60)
Glucose, Bld: 129 mg/dL — ABNORMAL HIGH (ref 70–99)
Potassium: 3.5 mmol/L (ref 3.5–5.1)
Sodium: 138 mmol/L (ref 135–145)
Total Bilirubin: 1.6 mg/dL — ABNORMAL HIGH (ref 0.3–1.2)
Total Protein: 6.3 g/dL — ABNORMAL LOW (ref 6.5–8.1)

## 2014-06-21 LAB — HEMOGLOBIN A1C
Hgb A1c MFr Bld: 7.9 % — ABNORMAL HIGH (ref 4.8–5.6)
Mean Plasma Glucose: 180 mg/dL

## 2014-06-21 LAB — CBC
HCT: 29.4 % — ABNORMAL LOW (ref 39.0–52.0)
Hemoglobin: 9.9 g/dL — ABNORMAL LOW (ref 13.0–17.0)
MCH: 32.5 pg (ref 26.0–34.0)
MCHC: 33.7 g/dL (ref 30.0–36.0)
MCV: 96.4 fL (ref 78.0–100.0)
Platelets: 133 10*3/uL — ABNORMAL LOW (ref 150–400)
RBC: 3.05 MIL/uL — ABNORMAL LOW (ref 4.22–5.81)
RDW: 15.9 % — ABNORMAL HIGH (ref 11.5–15.5)
WBC: 5.7 10*3/uL (ref 4.0–10.5)

## 2014-06-21 LAB — URINE CULTURE: Colony Count: >=100000

## 2014-06-21 MED ORDER — CEFTRIAXONE SODIUM IN DEXTROSE 40 MG/ML IV SOLN
2.0000 g | INTRAVENOUS | Status: DC
  Administered 2014-06-21 – 2014-06-22 (×2): 2 g via INTRAVENOUS
  Filled 2014-06-21 (×2): qty 50

## 2014-06-21 NOTE — Progress Notes (Signed)
PROGRESS NOTE  Johnny Ochoa CHE:[id number] DOB: 03-27-1945 DOA: 06/19/2014 PCP: No PCP Per Patient  HPI: 69 yo male with dm2, neuroapthy, CHF (EF?), apparently dx with uti at [facility] and then blood culture today from that visit grew gram negative rods, and pt was asked to go to ED  Subjective / 24 H Interval events - fever last night, afebrile overnight - no abdominal pain, nausea or vomiting  Assessment/Plan: Active Problems:   Anemia   Bacteremia   UTI (lower urinary tract infection)   Diabetes  Sepsis due to UTI - febrile to 102.2 on admission, hypotension, and proven GNR bacteremia - empiric Ceftriaxone, last fever 5/4 - cultures with E coli, sensitive to Ceftriaxone however blood cultures E coli is sensitive to Ciprofloxacin and urine cultures E coli is resistant to Cipro. I discussed with ID over the phone, may be 2 different strains. Both are sensitive to cephalosporins though.  Isolated bilirubin elevation - patient without GI symptoms. He tells me that last October he was hospitalized in [facility] and was supposed to have his gallbladder removed however because of his heart could not do surgery and had a percutaneous drain placed - obtain RUQ [place]  CHF, systolic, chronic - patient with pacemaker/ICD - exact EF unknown to [place], his cardiologist is in [facility] - he is stable, no fluid overload.   Anemia - stable  DM2 - continue Lantus, SSI - fairly good control  HLD - continue Lipitor  Diabetic neuropathy - continue Neurontin  HTN  - continue Lisinopril, Lasix, Metoprolol  Diet: Diet Heart Room service appropriate?: Yes; Fluid consistency:: Thin Fluids: NS DVT Prophylaxis: Lovenox  Code Status: Full Code Family Communication: no family bedside  Disposition Plan: home likely 5/6 if afebrile  Consultants:  None   Procedures:  None    Antibiotics Ceftriaxone 5/3 >>   Studies  No results found.  Objective  Filed Vitals:   06/21/14 0012 06/21/14 0454 06/21/14 0549 06/21/14 1321  BP:  128/62  136/72  Pulse:  70  73  Temp: 98.1 F (36.7 C) 98.3 F (36.8 C)  98.2 F (36.8 C)  TempSrc: Oral Oral  Oral  Resp:  18  20  Height:      Weight:   120.7 kg (266 lb 1.5 oz)   SpO2:  98%  98%    Intake/Output Summary (Last 24 hours) at 06/21/14 1419 Last data filed at 06/21/14 1322  Gross per 24 hour  Intake    530 ml  Output   4030 ml  Net  -3500 ml   Filed Weights   06/20/14 0532 06/20/14 1459 06/21/14 0549  Weight: 120.112 kg (264 lb 12.8 oz) 120.1 kg (264 lb 12.4 oz) 120.7 kg (266 lb 1.5 oz)    Exam:  General:  NAD  HEENT: no scleral icterus, PERRL  Cardiovascular: RRR without MRG, 2+ peripheral pulses, no edema  Respiratory: CTA biL, good air movement, no wheezing, no crackles, no rales  Abdomen: soft, non tender, BS +, no guarding  MSK/Extremities: no clubbing/cyanosis, no joint swelling  Skin: no rashes  Neuro: non focal  Data Reviewed: Basic Metabolic Panel:  Recent Labs Lab 06/19/14 0047 06/19/14 2040 06/20/14 0505 06/21/14 0520  NA 136 138 140 138  K 3.3* 3.6 3.5 3.5  CL 107 105 105 104  CO2 23 26 27 28   GLUCOSE 185* 211* 139* 129*  BUN 11 19 18 16   CREATININE 0.93 1.24 1.24 0.84  CALCIUM 8.7*  8.4* 8.3* 8.3*   Liver Function Tests:  Recent Labs Lab 06/19/14 2040 06/20/14 0505 06/21/14 0520  AST 34 36 29  ALT 22 22 24   ALKPHOS 58 56 62  BILITOT 1.5* 1.9* 1.6*  PROT 6.7 6.4* 6.3*  ALBUMIN 3.3* 2.9* 2.9*   CBC:  Recent Labs Lab 06/19/14 0047 06/19/14 2040 06/20/14 0505 06/21/14 0520  WBC 10.3 9.2 6.6 5.7  NEUTROABS 9.6* 8.1*  --   --   HGB 12.2* 10.7* 9.8* 9.9*  HCT 34.2* 31.5* 29.7* 29.4*  MCV 96.1 97.5 97.7 96.4  PLT 181 151 137* 133*   BNP (last 3 results)  Recent Labs  04/10/14 1520  BNP 90.0    ProBNP (last 3 results)  Recent Labs  11/30/13 1130  PROBNP 378.2*    CBG:  Recent Labs Lab 06/20/14 0742 06/20/14 1208 06/20/14 1652  06/20/14 2130 06/21/14 0737  GLUCAP 156* 204* 181* 262* 116*    Recent Results (from the past 240 hour(s))  Blood culture (routine x 2)     Status: None   Collection Time: 06/19/14 12:45 AM  Result Value Ref Range Status   Specimen Description BLOOD LEFT ANTECUBITAL  Final   Special Requests   Final    BOTTLES DRAWN AEROBIC AND ANAEROBIC AER 6cc,ANA 6cc   Culture   Final    ESCHERICHIA COLI Note: Gram Stain Report Called to,Read Back By and Verified With: AMY BURNS CHARGE NURSE ON 5.3.2016 AT 6:05P BY WILEJ Performed at [facility]    Report Status 06/21/2014 FINAL  Final   Organism ID, Bacteria ESCHERICHIA COLI  Final      Susceptibility   Escherichia coli - MIC*    AMPICILLIN >=32 RESISTANT Resistant     AMPICILLIN/SULBACTAM 16 INTERMEDIATE Intermediate     CEFAZOLIN <=4 SENSITIVE Sensitive     CEFEPIME <=1 SENSITIVE Sensitive     CEFTAZIDIME <=1 SENSITIVE Sensitive     CEFTRIAXONE <=1 SENSITIVE Sensitive     CIPROFLOXACIN <=0.25 SENSITIVE Sensitive     GENTAMICIN <=1 SENSITIVE Sensitive     IMIPENEM <=0.25 SENSITIVE Sensitive     PIP/TAZO <=4 SENSITIVE Sensitive     TOBRAMYCIN <=1 SENSITIVE Sensitive     TRIMETH/SULFA >=320 RESISTANT Resistant     * ESCHERICHIA COLI  Blood culture (routine x 2)     Status: None   Collection Time: 06/19/14 12:45 AM  Result Value Ref Range Status   Specimen Description BLOOD RIGHT HAND  Final   Special Requests   Final    BOTTLES DRAWN AEROBIC AND ANAEROBIC AER 5cc,ANA 5cc   Culture   Final    ESCHERICHIA COLI Note: SUSCEPTIBILITIES PERFORMED ON PREVIOUS CULTURE WITHIN THE LAST 5 DAYS. Note: Gram Stain Report Called to,Read Back By and Verified With: AMY BURNS ON 5.3.16 AT 6:05P BY WILEJ Performed at [facility]    Report Status 06/21/2014 FINAL  Final  Urine culture     Status: None   Collection Time: 06/19/14 12:52 AM  Result Value Ref Range Status   Specimen Description URINE, CATHETERIZED  Final   Special  Requests NONE  Final   Colony Count   Final    >=100,000 COLONIES/ML Performed at [facility]    Culture   Final    ESCHERICHIA COLI Performed at [facility]    Report Status 06/21/2014 FINAL  Final   Organism ID, Bacteria ESCHERICHIA COLI  Final      Susceptibility   Escherichia coli - MIC*  AMPICILLIN >=32 RESISTANT Resistant     CEFAZOLIN <=4 SENSITIVE Sensitive     CEFTRIAXONE <=1 SENSITIVE Sensitive     CIPROFLOXACIN >=4 RESISTANT Resistant     GENTAMICIN <=1 SENSITIVE Sensitive     LEVOFLOXACIN >=8 RESISTANT Resistant     NITROFURANTOIN <=16 SENSITIVE Sensitive     TOBRAMYCIN <=1 SENSITIVE Sensitive     TRIMETH/SULFA <=20 SENSITIVE Sensitive     PIP/TAZO <=4 SENSITIVE Sensitive     * ESCHERICHIA COLI  Blood culture (routine x 2)     Status: None (Preliminary result)   Collection Time: 06/19/14  8:40 PM  Result Value Ref Range Status   Specimen Description LEFT ANTECUBITAL  Final   Special Requests NONE  Final   Culture   Final           BLOOD CULTURE RECEIVED NO GROWTH TO DATE CULTURE WILL BE HELD FOR 5 DAYS BEFORE ISSUING A FINAL NEGATIVE REPORT Performed at [facility]    Report Status PENDING  Incomplete  Blood culture (routine x 2)     Status: None (Preliminary result)   Collection Time: 06/19/14  9:30 PM  Result Value Ref Range Status   Specimen Description BLOOD RIGHT HAND  Final   Special Requests NONE  Final   Culture   Final           BLOOD CULTURE RECEIVED NO GROWTH TO DATE CULTURE WILL BE HELD FOR 5 DAYS BEFORE ISSUING A FINAL NEGATIVE REPORT Performed at [facility]    Report Status PENDING  Incomplete  Urine culture     Status: None   Collection Time: 06/19/14  9:58 PM  Result Value Ref Range Status   Specimen Description URINE, CLEAN CATCH  Final   Special Requests NONE  Final   Colony Count NO GROWTH Performed at [facility]   Final   Culture NO GROWTH Performed at [facility]   Final    Report Status 06/20/2014 FINAL  Final     Scheduled Meds: . atorvastatin  10 mg Oral q1800  . cefTRIAXone (ROCEPHIN)  IV  2 g Intravenous Q24H  . enoxaparin (LOVENOX) injection  40 mg Subcutaneous QHS  . feeding supplement (ENSURE ENLIVE)  237 mL Oral BID BM  . furosemide  40 mg Oral BID  . gabapentin  300 mg Oral TID  . insulin aspart  0-5 Units Subcutaneous QHS  . insulin aspart  0-9 Units Subcutaneous TID WC  . insulin glargine  15 Units Subcutaneous QHS  . lisinopril  10 mg Oral Daily  . methadone  10 mg Oral 4 times per day  . metoprolol tartrate  25 mg Oral BID  . multivitamin with minerals  1 tablet Oral Daily  . nitroGLYCERIN  0.2 mg Transdermal Daily  . nitroGLYCERIN  0.6 mg Transdermal Daily  . pantoprazole  40 mg Oral Daily  . sodium chloride  3 mL Intravenous Q12H  . terazosin  4 mg Oral QHS   Continuous Infusions:   Time spent - 25 minutes, more than 50% bedside for discussions/counseling  Marzetta Board, MD [facility] Pager [phone]. If 7 PM - 7 AM, please contact night-coverage at [email or web address], password [facility] 06/21/2014, 2:19 PM  LOS: 2 days

## 2014-06-22 ENCOUNTER — Inpatient Hospital Stay (HOSPITAL_COMMUNITY): Payer: Medicare Other

## 2014-06-22 LAB — GLUCOSE, CAPILLARY
Glucose-Capillary: 137 mg/dL — ABNORMAL HIGH (ref 70–99)
Glucose-Capillary: 193 mg/dL — ABNORMAL HIGH (ref 70–99)
Glucose-Capillary: 251 mg/dL — ABNORMAL HIGH (ref 70–99)
Glucose-Capillary: 272 mg/dL — ABNORMAL HIGH (ref 70–99)

## 2014-06-22 NOTE — Care Management Note (Signed)
Case Management Note  Patient Details  Name: Britten Seyfried MRN: [id number] Date of Birth: March 25, 1945  Subjective/Objective:                    Action/Plan:   Expected Discharge Date:  06/25/14               Expected Discharge Plan:  Home/Self Care  In-House Referral:     Discharge planning Services  CM Consult  Post Acute Care Choice:    Choice offered to:     DME Arranged:    DME Agency:     HH Arranged:    HH Agency:     Status of Service:  In process, will continue to follow  Medicare Important Message Given:  Yes Date Medicare IM Given:  06/22/14 Medicare IM give by:  Dessa Phi Date Additional Medicare IM Given:    Additional Medicare Important Message give by:     If discussed at [place] of Stay Meetings, dates discussed:    Additional Comments:  Dessa Phi, RN 06/22/2014, 12:54 PM

## 2014-06-22 NOTE — Progress Notes (Signed)
PROGRESS NOTE  Vedh Ptacek EGB:[id number] DOB: 09-16-1945 DOA: 06/19/2014 PCP: No PCP Per Patient  HPI: 69 yo male with dm2, neuroapthy, CHF (EF?), apparently dx with uti at [facility] and then blood culture today from that visit grew gram negative rods, and pt was asked to go to ED  Subjective / 24 H Interval events - fever curve improving, mild subjective chills  Assessment/Plan: Active Problems:   Anemia   Bacteremia   UTI (lower urinary tract infection)   Diabetes  Sepsis due to UTI - febrile to 102.2 on admission, hypotension, and proven GNR bacteremia - empiric Ceftriaxone, last fever 5/4 - cultures with E coli, sensitive to Ceftriaxone however blood cultures E coli is sensitive to Ciprofloxacin and urine cultures E coli is resistant to Cipro. I discussed with ID over the phone, may be 2 different strains. Both are sensitive to cephalosporins though. - fever curve improving, low grade temp last night  Isolated bilirubin elevation - patient without GI symptoms. He tells me that last October he was hospitalized in [facility] and was supposed to have his gallbladder removed however because of his heart could not do surgery and had a percutaneous drain placed - RUQ [place] without significant acute findings  CHF, systolic, chronic - patient with pacemaker/ICD - exact EF unknown to [place], his cardiologist is in [facility] - he is stable, no fluid overload.   Anemia - stable  DM2 - continue Lantus, SSI - fairly good control  HLD - continue Lipitor  Diabetic neuropathy - continue Neurontin  HTN  - continue Lisinopril, Lasix, Metoprolol  Diet: Diet Heart Room service appropriate?: Yes; Fluid consistency:: Thin Fluids: NS DVT Prophylaxis: Lovenox  Code Status: Full Code Family Communication: wife bedside Disposition Plan: home likely 5/7 if afebrile  Consultants:  None   Procedures:  None    Antibiotics Ceftriaxone 5/3 >>   Studies  US Abdomen  Limited Ruq  06/22/2014   CLINICAL DATA:  Several day history of right upper quadrant pain, previous drainage procedure on the gallbladder due to inability to undergo cholecystectomy  EXAM: US ABDOMEN LIMITED - RIGHT UPPER QUADRANT  COMPARISON:  CT scan of the chest of April 20, 2014 and abdominal ultrasound of December 11, 2013.  FINDINGS: Gallbladder:  The gallbladder is adequately distended. There is echogenic material within the gallbladder fundus that may reflect sludge though this is not mobile. Small stones are not excluded. There prominent gallbladder wall folds. The gallbladder wall thickness measures 2.2 mm. There is no positive sonographic Murphy's sign.  Common bile duct:  Diameter: 4.3 mm  Liver:  The hepatic echotexture is increased diffusely. There is no focal mass or ductal dilation.  IMPRESSION: 1. Sludge versus non shadowing, non mobile stones. There is no sonographic evidence of acute cholecystitis. 2. Fatty infiltrative change of the liver. The common bile duct caliber is normal.   Electronically Signed   By: David  Martinique M.D.   On: 06/22/2014 10:23    Objective  Filed Vitals:   06/21/14 0549 06/21/14 1321 06/21/14 2123 06/22/14 0556  BP:  136/72 109/56 112/52  Pulse:  73 69 70  Temp:  98.2 F (36.8 C) 99.7 F (37.6 C) 99.1 F (37.3 C)  TempSrc:  Oral Oral Oral  Resp:  20 20 20   Height:      Weight: 120.7 kg (266 lb 1.5 oz)   118.8 kg (261 lb 14.5 oz)  SpO2:  98% 97% 98%    Intake/Output Summary (  Last 24 hours) at 06/22/14 1411 Last data filed at 06/22/14 1246  Gross per 24 hour  Intake    650 ml  Output   2450 ml  Net  -1800 ml   Filed Weights   06/20/14 1459 06/21/14 0549 06/22/14 0556  Weight: 120.1 kg (264 lb 12.4 oz) 120.7 kg (266 lb 1.5 oz) 118.8 kg (261 lb 14.5 oz)    Exam:  General:  NAD  HEENT: no scleral icterus, PERRL  Cardiovascular: RRR without MRG, 2+ peripheral pulses, no edema  Respiratory: CTA biL, good air movement, no wheezing, no  crackles, no rales  Abdomen: soft, non tender, BS +, no guarding  MSK/Extremities: no clubbing/cyanosis, no joint swelling   Data Reviewed: Basic Metabolic Panel:  Recent Labs Lab 06/19/14 0047 06/19/14 2040 06/20/14 0505 06/21/14 0520  NA 136 138 140 138  K 3.3* 3.6 3.5 3.5  CL 107 105 105 104  CO2 23 26 27 28   GLUCOSE 185* 211* 139* 129*  BUN 11 19 18 16   CREATININE 0.93 1.24 1.24 0.84  CALCIUM 8.7* 8.4* 8.3* 8.3*   Liver Function Tests:  Recent Labs Lab 06/19/14 2040 06/20/14 0505 06/21/14 0520  AST 34 36 29  ALT 22 22 24   ALKPHOS 58 56 62  BILITOT 1.5* 1.9* 1.6*  PROT 6.7 6.4* 6.3*  ALBUMIN 3.3* 2.9* 2.9*   CBC:  Recent Labs Lab 06/19/14 0047 06/19/14 2040 06/20/14 0505 06/21/14 0520  WBC 10.3 9.2 6.6 5.7  NEUTROABS 9.6* 8.1*  --   --   HGB 12.2* 10.7* 9.8* 9.9*  HCT 34.2* 31.5* 29.7* 29.4*  MCV 96.1 97.5 97.7 96.4  PLT 181 151 137* 133*   BNP (last 3 results)  Recent Labs  04/10/14 1520  BNP 90.0    ProBNP (last 3 results)  Recent Labs  11/30/13 1130  PROBNP 378.2*    CBG:  Recent Labs Lab 06/20/14 2130 06/21/14 0737 06/21/14 1209 06/21/14 1718 06/21/14 2121  GLUCAP 262* 116* 233* 227* 192*    Recent Results (from the past 240 hour(s))  Blood culture (routine x 2)     Status: None   Collection Time: 06/19/14 12:45 AM  Result Value Ref Range Status   Specimen Description BLOOD LEFT ANTECUBITAL  Final   Special Requests   Final    BOTTLES DRAWN AEROBIC AND ANAEROBIC AER 6cc,ANA 6cc   Culture   Final    ESCHERICHIA COLI Note: Gram Stain Report Called to,Read Back By and Verified With: AMY BURNS CHARGE NURSE ON 5.3.2016 AT 6:05P BY WILEJ Performed at [facility]    Report Status 06/21/2014 FINAL  Final   Organism ID, Bacteria ESCHERICHIA COLI  Final      Susceptibility   Escherichia coli - MIC*    AMPICILLIN >=32 RESISTANT Resistant     AMPICILLIN/SULBACTAM 16 INTERMEDIATE Intermediate     CEFAZOLIN <=4  SENSITIVE Sensitive     CEFEPIME <=1 SENSITIVE Sensitive     CEFTAZIDIME <=1 SENSITIVE Sensitive     CEFTRIAXONE <=1 SENSITIVE Sensitive     CIPROFLOXACIN <=0.25 SENSITIVE Sensitive     GENTAMICIN <=1 SENSITIVE Sensitive     IMIPENEM <=0.25 SENSITIVE Sensitive     PIP/TAZO <=4 SENSITIVE Sensitive     TOBRAMYCIN <=1 SENSITIVE Sensitive     TRIMETH/SULFA >=320 RESISTANT Resistant     * ESCHERICHIA COLI  Blood culture (routine x 2)     Status: None   Collection Time: 06/19/14 12:45 AM  Result Value Ref Range  Status   Specimen Description BLOOD RIGHT HAND  Final   Special Requests   Final    BOTTLES DRAWN AEROBIC AND ANAEROBIC AER 5cc,ANA 5cc   Culture   Final    ESCHERICHIA COLI Note: SUSCEPTIBILITIES PERFORMED ON PREVIOUS CULTURE WITHIN THE LAST 5 DAYS. Note: Gram Stain Report Called to,Read Back By and Verified With: AMY BURNS ON 5.3.16 AT 6:05P BY WILEJ Performed at [facility]    Report Status 06/21/2014 FINAL  Final  Urine culture     Status: None   Collection Time: 06/19/14 12:52 AM  Result Value Ref Range Status   Specimen Description URINE, CATHETERIZED  Final   Special Requests NONE  Final   Colony Count   Final    >=100,000 COLONIES/ML Performed at [facility]    Culture   Final    ESCHERICHIA COLI Performed at [facility]    Report Status 06/21/2014 FINAL  Final   Organism ID, Bacteria ESCHERICHIA COLI  Final      Susceptibility   Escherichia coli - MIC*    AMPICILLIN >=32 RESISTANT Resistant     CEFAZOLIN <=4 SENSITIVE Sensitive     CEFTRIAXONE <=1 SENSITIVE Sensitive     CIPROFLOXACIN >=4 RESISTANT Resistant     GENTAMICIN <=1 SENSITIVE Sensitive     LEVOFLOXACIN >=8 RESISTANT Resistant     NITROFURANTOIN <=16 SENSITIVE Sensitive     TOBRAMYCIN <=1 SENSITIVE Sensitive     TRIMETH/SULFA <=20 SENSITIVE Sensitive     PIP/TAZO <=4 SENSITIVE Sensitive     * ESCHERICHIA COLI  Blood culture (routine x 2)     Status: None (Preliminary  result)   Collection Time: 06/19/14  8:40 PM  Result Value Ref Range Status   Specimen Description LEFT ANTECUBITAL  Final   Special Requests NONE  Final   Culture   Final           BLOOD CULTURE RECEIVED NO GROWTH TO DATE CULTURE WILL BE HELD FOR 5 DAYS BEFORE ISSUING A FINAL NEGATIVE REPORT Performed at [facility]    Report Status PENDING  Incomplete  Blood culture (routine x 2)     Status: None (Preliminary result)   Collection Time: 06/19/14  9:30 PM  Result Value Ref Range Status   Specimen Description BLOOD RIGHT HAND  Final   Special Requests NONE  Final   Culture   Final           BLOOD CULTURE RECEIVED NO GROWTH TO DATE CULTURE WILL BE HELD FOR 5 DAYS BEFORE ISSUING A FINAL NEGATIVE REPORT Performed at [facility]    Report Status PENDING  Incomplete  Urine culture     Status: None   Collection Time: 06/19/14  9:58 PM  Result Value Ref Range Status   Specimen Description URINE, CLEAN CATCH  Final   Special Requests NONE  Final   Colony Count NO GROWTH Performed at [facility]   Final   Culture NO GROWTH Performed at [facility]   Final   Report Status 06/20/2014 FINAL  Final     Scheduled Meds: . atorvastatin  10 mg Oral q1800  . cefTRIAXone (ROCEPHIN)  IV  2 g Intravenous Q24H  . enoxaparin (LOVENOX) injection  40 mg Subcutaneous QHS  . feeding supplement (ENSURE ENLIVE)  237 mL Oral BID BM  . furosemide  40 mg Oral BID  . gabapentin  300 mg Oral TID  . insulin aspart  0-5 Units Subcutaneous  QHS  . insulin aspart  0-9 Units Subcutaneous TID WC  . insulin glargine  15 Units Subcutaneous QHS  . lisinopril  10 mg Oral Daily  . methadone  10 mg Oral 4 times per day  . metoprolol tartrate  25 mg Oral BID  . multivitamin with minerals  1 tablet Oral Daily  . nitroGLYCERIN  0.2 mg Transdermal Daily  . nitroGLYCERIN  0.6 mg Transdermal Daily  . pantoprazole  40 mg Oral Daily  . sodium chloride  3 mL Intravenous Q12H  .  terazosin  4 mg Oral QHS   Continuous Infusions:    Marzetta Board, MD [facility] Pager [phone]. If 7 PM - 7 AM, please contact night-coverage at [email or web address], password [facility] 06/22/2014, 2:11 PM  LOS: 3 days

## 2014-06-23 DIAGNOSIS — I5023 Acute on chronic systolic (congestive) heart failure: Secondary | ICD-10-CM | POA: Diagnosis present

## 2014-06-23 DIAGNOSIS — I5022 Chronic systolic (congestive) heart failure: Secondary | ICD-10-CM

## 2014-06-23 LAB — GLUCOSE, CAPILLARY: Glucose-Capillary: 164 mg/dL — ABNORMAL HIGH (ref 70–99)

## 2014-06-23 MED ORDER — CEPHALEXIN 500 MG PO CAPS: 500.0000 mg | ORAL_CAPSULE | Freq: Three times a day (TID) | ORAL | Status: DC

## 2014-06-23 NOTE — Discharge Instructions (Signed)
Follow with No PCP Per Patient in 5-7 days ° °Please get a complete blood count and chemistry panel checked by your Primary MD at your next visit, and again as instructed by your Primary MD. Please get your medications reviewed and adjusted by your Primary MD. ° °Please request your Primary MD to go over all Hospital Tests and Procedure/Radiological results at the follow up, please get all Hospital records sent to your Prim MD by signing hospital release before you go home. ° °If you had Pneumonia of Lung problems at the Hospital: °Please get a 2 view Chest X ray done in 6-8 weeks after hospital discharge or sooner if instructed by your Primary MD. ° °If you have Congestive Heart Failure: °Please call your Cardiologist or Primary MD anytime you have any of the following symptoms:  °1) 3 pound weight gain in 24 hours or 5 pounds in 1 week  °2) shortness of breath, with or without a dry hacking cough  °3) swelling in the hands, feet or stomach  °4) if you have to sleep on extra pillows at night in order to breathe ° °Follow cardiac low salt diet and 1.5 lit/day fluid restriction. ° °If you have diabetes °Accuchecks 4 times/day, Once in AM empty stomach and then before each meal. °Log in all results and show them to your primary doctor at your next visit. °If any glucose reading is under 80 or above 300 call your primary MD immediately. ° °If you have Seizure/Convulsions/Epilepsy: °Please do not drive, operate heavy machinery, participate in activities at heights or participate in high speed sports until you have seen by Primary MD or a Neurologist and advised to do so again. ° °If you had Gastrointestinal Bleeding: °Please ask your Primary MD to check a complete blood count within one week of discharge or at your next visit. Your endoscopic/colonoscopic biopsies that are pending at the time of discharge, will also need to followed by your Primary MD. ° °Get Medicines reviewed and adjusted. °Please take all your  medications with you for your next visit with your Primary MD ° °Please request your Primary MD to go over all hospital tests and procedure/radiological results at the follow up, please ask your Primary MD to get all Hospital records sent to his/her office. ° °If you experience worsening of your admission symptoms, develop shortness of breath, life threatening emergency, suicidal or homicidal thoughts you must seek medical attention immediately by calling 911 or calling your MD immediately  if symptoms less severe. ° °You must read complete instructions/literature along with all the possible adverse reactions/side effects for all the Medicines you take and that have been prescribed to you. Take any new Medicines after you have completely understood and accpet all the possible adverse reactions/side effects.  ° °Do not drive or operate heavy machinery when taking Pain medications.  ° °Do not take more than prescribed Pain, Sleep and Anxiety Medications ° °Special Instructions: If you have smoked or chewed Tobacco  in the last 2 yrs please stop smoking, stop any regular Alcohol  and or any Recreational drug use. ° °Wear Seat belts while driving. ° °Please note °You were cared for by a hospitalist during your hospital stay. If you have any questions about your discharge medications or the care you received while you were in the hospital after you are discharged, you can call the unit and asked to speak with the hospitalist on call if the hospitalist that took care of you is not available.   Once you are discharged, your primary care physician will handle any further medical issues. Please note that NO REFILLS for any discharge medications will be authorized once you are discharged, as it is imperative that you return to your primary care physician (or establish a relationship with a primary care physician if you do not have one) for your aftercare needs so that they can reassess your need for medications and monitor your  lab values.  You can reach the hospitalist office at phone [phone] or fax [phone]   If you do not have a primary care physician, you can call [phone] for a physician referral.  Activity: As tolerated with Full fall precautions use walker/cane & assistance as needed  Diet: heart healthy, diabetic  Disposition Home

## 2014-06-23 NOTE — Discharge Summary (Addendum)
Physician Discharge Summary  Dream Nodal DQQ:[id number] DOB: 1945-07-11 DOA: 06/19/2014  [facility]  Admit date: 06/19/2014 Discharge date: 06/23/2014  Time spent: > 30 minutes  Recommendations for Outpatient Follow-up:  1. [facility] with [facility] in 1 week 2. Follow up with GI in [place] in 1 week (already has an appointment per patient) 3. Follow up with Cardiology in [facility] regularly  4. Continue Keflex for 7 additional days  Discharge Diagnoses:  Active Problems:   Anemia   Bacteremia   UTI (lower urinary tract infection)   Diabetes   Chronic systolic heart failure  Discharge Condition: stable  Diet recommendation: heart healthy  Filed Weights   06/21/14 0549 06/22/14 0556 06/23/14 0551  Weight: 120.7 kg (266 lb 1.5 oz) 118.8 kg (261 lb 14.5 oz) 117.3 kg (258 lb 9.6 oz)    History of present illness:  69 yo male with dm2, neuroapthy, CHF (EF?), apparently dx with uti at [place] and then blood culture today from that visit grew gram negative rods, and pt was asked to go to ED. At [facility], pt seen and was febrile and bp somewhat low, Pt admits to dysuria, denies heamturia, flank pain, n/v, diarrhea, brbpr, black stool. Pt will be admitted for urosepsis.   Hospital Course:  Sepsis due to UTI - febrile to 102.2 on admission, hypotension, and proven GNR bacteremia. Patient was started on empiric Ceftriaxone. He was febrile initially during his hospital stay however his fever curve improved and was afebrile on discharge. He speciated E coli both in his blood and urine cultures, interestingly enough one was sensitive to Ciprofloxacin and one was resistant. I discussed with ID over the phone and it seems like this suggests 2 strains. Both were sensitive to Cephalosporins, and patient was discharged home to complete 7 additional days of Keflex. Repeat blood and urine cultures obtained on admission remained negative as of discharge date. Isolated bilirubin elevation - patient  without GI symptoms. He tells me that last October he was hospitalized in [facility] and was supposed to have his gallbladder removed however because of his heart could not do surgery and had a percutaneous drain placed. He underwent a RUQ [place] without significant acute findings. CHF, systolic, chronic - patient with pacemaker/ICD, exact EF unknown to [place], his cardiologist is in [facility]. He was stable, no fluid overload, no chest pain Anemia - stable DM2- resume home regimen on d/c  HLD - continue Lipitor Diabetic neuropathy - continue Neurontin HTN - continue Lisinopril, Lasix, Metoprolol  Procedures:  None    Consultations:  None   Discharge Exam: Filed Vitals:   06/22/14 0556 06/22/14 1447 06/22/14 2034 06/23/14 0551  BP: 112/52 127/60 135/63 122/55  Pulse: 70 69 72 72  Temp: 99.1 F (37.3 C) 97.9 F (36.6 C) 98.5 F (36.9 C) 99.4 F (37.4 C)  TempSrc: Oral Oral Oral Oral  Resp: 20 16 18 20   Height:      Weight: 118.8 kg (261 lb 14.5 oz)   117.3 kg (258 lb 9.6 oz)  SpO2: 98% 99% 98% 97%    General: NAD Cardiovascular: RRR Respiratory: CTA biL  Discharge Instructions     Medication List    TAKE these medications        albuterol 108 (90 BASE) MCG/ACT inhaler  Commonly known as:  PROVENTIL HFA;VENTOLIN HFA  Inhale 2 puffs into the lungs every 4 (four) hours as needed for wheezing or shortness of breath.  aspirin EC 81 MG tablet  Take 81 mg by mouth daily.     atorvastatin 80 MG tablet  Commonly known as:  LIPITOR  Take 80 mg by mouth daily.     cephALEXin 500 MG capsule  Commonly known as:  KEFLEX  Take 1 capsule (500 mg total) by mouth 3 (three) times daily.     docusate sodium 100 MG capsule  Commonly known as:  COLACE  Take 100 mg by mouth 2 (two) times daily.     DULoxetine 30 MG capsule  Commonly known as:  CYMBALTA  Take 30 mg by mouth daily.     furosemide 40 MG tablet  Commonly known as:  LASIX  Take 40 mg by mouth daily.      furosemide 20 MG tablet  Commonly known as:  LASIX  Take 20-40 mg by mouth 2 (two) times daily. Take 2 tablets in the morning as needed and take 1 tablet before lunch daily for blood pressure control and fluid     gabapentin 400 MG capsule  Commonly known as:  NEURONTIN  Take 400 mg by mouth 4 (four) times daily.     HYDROcodone-acetaminophen 5-325 MG per tablet  Commonly known as:  NORCO/VICODIN  Take 2 tablets by mouth every 4 (four) hours as needed.     insulin glargine 100 UNIT/ML injection  Commonly known as:  LANTUS  Inject 24 Units into the skin at bedtime.     insulin NPH-regular Human (70-30) 100 UNIT/ML injection  Commonly known as:  NOVOLIN 70/30  Inject 28 Units into the skin 3 (three) times daily.     lisinopril 20 MG tablet  Commonly known as:  PRINIVIL,ZESTRIL  Take 10 mg by mouth 2 (two) times daily.     Menthol-Methyl Salicylate 1-63 % Crea  Apply 1 application topically 4 (four) times daily as needed (to knees for pain).     methadone 10 MG tablet  Commonly known as:  DOLOPHINE  Take 10 mg by mouth every 12 (twelve) hours.     metoprolol 50 MG tablet  Commonly known as:  LOPRESSOR  Take 25 mg by mouth 2 (two) times daily.     multivitamin tablet  Take 1 tablet by mouth daily.     nitroGLYCERIN 0.4 MG SL tablet  Commonly known as:  NITROSTAT  Place 0.4 mg under the tongue every 5 (five) minutes as needed.     nitroGLYCERIN 0.6 mg/hr patch  Commonly known as:  NITRODUR - Dosed in mg/24 hr  Place 1 patch onto the skin daily.     nitroGLYCERIN 0.2 mg/hr patch  Commonly known as:  NITRODUR - Dosed in mg/24 hr  Place 1 patch onto the skin daily. Use in conjunction with 0.6 mg/hr patch     omeprazole 20 MG capsule  Commonly known as:  PRILOSEC  Take 40 mg by mouth 2 (two) times daily before a meal.     OXYCODONE HCL PO  Take 5 mg by mouth 2 (two) times daily as needed.     polyethylene glycol packet  Commonly known as:  MIRALAX / GLYCOLAX  Take 17  g by mouth at bedtime.     tamsulosin 0.4 MG Caps capsule  Commonly known as:  FLOMAX  Take 0.4 mg by mouth at bedtime.     TGT PSYLLIUM FIBER PO  Take 5-15 mLs by mouth at bedtime. Take 2 teaspoon daily and slowly increase to 1 tablespoon daily at bedtime  tiZANidine 2 MG tablet  Commonly known as:  ZANAFLEX  Take 2 mg by mouth 2 (two) times daily.           Follow-up Information    Follow up with PCP in [facility]. Schedule an appointment as soon as possible for a visit in 1 week.      The results of significant diagnostics from this hospitalization (including imaging, microbiology, ancillary and laboratory) are listed below for reference.    Significant Diagnostic Studies: Dg Chest 2 View  06/18/2014   CLINICAL DATA:  Fever, short of breath, body ache  EXAM: CHEST  2 VIEW  COMPARISON:  04/30/2014, CT 05/01/2014  FINDINGS: Left-sided pacemaker overlies normal cardiac silhouette. There is interval increase in central venous pulmonary congestion and mild interstitial edema. Pleural thickening along the right lateral hemi thorax is not changed from comparison CT. No pneumothorax. No focal consolidation.  IMPRESSION: Increase in central venous congestion and interstitial edema.   Electronically Signed   By: Suzy Bouchard M.D.   On: 06/18/2014 23:10   US Abdomen Limited Ruq  06/22/2014   CLINICAL DATA:  Several day history of right upper quadrant pain, previous drainage procedure on the gallbladder due to inability to undergo cholecystectomy  EXAM: US ABDOMEN LIMITED - RIGHT UPPER QUADRANT  COMPARISON:  CT scan of the chest of April 20, 2014 and abdominal ultrasound of December 11, 2013.  FINDINGS: Gallbladder:  The gallbladder is adequately distended. There is echogenic material within the gallbladder fundus that may reflect sludge though this is not mobile. Small stones are not excluded. There prominent gallbladder wall folds. The gallbladder wall thickness measures 2.2 mm. There is no  positive sonographic Murphy's sign.  Common bile duct:  Diameter: 4.3 mm  Liver:  The hepatic echotexture is increased diffusely. There is no focal mass or ductal dilation.  IMPRESSION: 1. Sludge versus non shadowing, non mobile stones. There is no sonographic evidence of acute cholecystitis. 2. Fatty infiltrative change of the liver. The common bile duct caliber is normal.   Electronically Signed   By: David  Martinique M.D.   On: 06/22/2014 10:23    Microbiology: Recent Results (from the past 240 hour(s))  Blood culture (routine x 2)     Status: None   Collection Time: 06/19/14 12:45 AM  Result Value Ref Range Status   Specimen Description BLOOD LEFT ANTECUBITAL  Final   Special Requests   Final    BOTTLES DRAWN AEROBIC AND ANAEROBIC AER 6cc,ANA 6cc   Culture   Final    ESCHERICHIA COLI Note: Gram Stain Report Called to,Read Back By and Verified With: AMY BURNS CHARGE NURSE ON 5.3.2016 AT 6:05P BY WILEJ Performed at [facility]    Report Status 06/21/2014 FINAL  Final   Organism ID, Bacteria ESCHERICHIA COLI  Final      Susceptibility   Escherichia coli - MIC*    AMPICILLIN >=32 RESISTANT Resistant     AMPICILLIN/SULBACTAM 16 INTERMEDIATE Intermediate     CEFAZOLIN <=4 SENSITIVE Sensitive     CEFEPIME <=1 SENSITIVE Sensitive     CEFTAZIDIME <=1 SENSITIVE Sensitive     CEFTRIAXONE <=1 SENSITIVE Sensitive     CIPROFLOXACIN <=0.25 SENSITIVE Sensitive     GENTAMICIN <=1 SENSITIVE Sensitive     IMIPENEM <=0.25 SENSITIVE Sensitive     PIP/TAZO <=4 SENSITIVE Sensitive     TOBRAMYCIN <=1 SENSITIVE Sensitive     TRIMETH/SULFA >=320 RESISTANT Resistant     * ESCHERICHIA COLI  Blood culture (  routine x 2)     Status: None   Collection Time: 06/19/14 12:45 AM  Result Value Ref Range Status   Specimen Description BLOOD RIGHT HAND  Final   Special Requests   Final    BOTTLES DRAWN AEROBIC AND ANAEROBIC AER 5cc,ANA 5cc   Culture   Final    ESCHERICHIA COLI Note: SUSCEPTIBILITIES  PERFORMED ON PREVIOUS CULTURE WITHIN THE LAST 5 DAYS. Note: Gram Stain Report Called to,Read Back By and Verified With: AMY BURNS ON 5.3.16 AT 6:05P BY WILEJ Performed at [facility]    Report Status 06/21/2014 FINAL  Final  Urine culture     Status: None   Collection Time: 06/19/14 12:52 AM  Result Value Ref Range Status   Specimen Description URINE, CATHETERIZED  Final   Special Requests NONE  Final   Colony Count   Final    >=100,000 COLONIES/ML Performed at [facility]    Culture   Final    ESCHERICHIA COLI Performed at [facility]    Report Status 06/21/2014 FINAL  Final   Organism ID, Bacteria ESCHERICHIA COLI  Final      Susceptibility   Escherichia coli - MIC*    AMPICILLIN >=32 RESISTANT Resistant     CEFAZOLIN <=4 SENSITIVE Sensitive     CEFTRIAXONE <=1 SENSITIVE Sensitive     CIPROFLOXACIN >=4 RESISTANT Resistant     GENTAMICIN <=1 SENSITIVE Sensitive     LEVOFLOXACIN >=8 RESISTANT Resistant     NITROFURANTOIN <=16 SENSITIVE Sensitive     TOBRAMYCIN <=1 SENSITIVE Sensitive     TRIMETH/SULFA <=20 SENSITIVE Sensitive     PIP/TAZO <=4 SENSITIVE Sensitive     * ESCHERICHIA COLI  Blood culture (routine x 2)     Status: None (Preliminary result)   Collection Time: 06/19/14  8:40 PM  Result Value Ref Range Status   Specimen Description LEFT ANTECUBITAL  Final   Special Requests NONE  Final   Culture   Final           BLOOD CULTURE RECEIVED NO GROWTH TO DATE CULTURE WILL BE HELD FOR 5 DAYS BEFORE ISSUING A FINAL NEGATIVE REPORT Performed at [facility]    Report Status PENDING  Incomplete  Blood culture (routine x 2)     Status: None (Preliminary result)   Collection Time: 06/19/14  9:30 PM  Result Value Ref Range Status   Specimen Description BLOOD RIGHT HAND  Final   Special Requests NONE  Final   Culture   Final           BLOOD CULTURE RECEIVED NO GROWTH TO DATE CULTURE WILL BE HELD FOR 5 DAYS BEFORE ISSUING A FINAL NEGATIVE  REPORT Performed at [facility]    Report Status PENDING  Incomplete  Urine culture     Status: None   Collection Time: 06/19/14  9:58 PM  Result Value Ref Range Status   Specimen Description URINE, CLEAN CATCH  Final   Special Requests NONE  Final   Colony Count NO GROWTH Performed at [facility]   Final   Culture NO GROWTH Performed at [facility]   Final   Report Status 06/20/2014 FINAL  Final     Labs: Basic Metabolic Panel:  Recent Labs Lab 06/19/14 0047 06/19/14 2040 06/20/14 0505 06/21/14 0520  NA 136 138 140 138  K 3.3* 3.6 3.5 3.5  CL 107 105 105 104  CO2 23 26 27 28   GLUCOSE 185* 211*  139* 129*  BUN 11 19 18 16   CREATININE 0.93 1.24 1.24 0.84  CALCIUM 8.7* 8.4* 8.3* 8.3*   Liver Function Tests:  Recent Labs Lab 06/19/14 2040 06/20/14 0505 06/21/14 0520  AST 34 36 29  ALT 22 22 24   ALKPHOS 58 56 62  BILITOT 1.5* 1.9* 1.6*  PROT 6.7 6.4* 6.3*  ALBUMIN 3.3* 2.9* 2.9*   CBC:  Recent Labs Lab 06/19/14 0047 06/19/14 2040 06/20/14 0505 06/21/14 0520  WBC 10.3 9.2 6.6 5.7  NEUTROABS 9.6* 8.1*  --   --   HGB 12.2* 10.7* 9.8* 9.9*  HCT 34.2* 31.5* 29.7* 29.4*  MCV 96.1 97.5 97.7 96.4  PLT 181 151 137* 133*   BNP: BNP (last 3 results)  Recent Labs  04/10/14 1520  BNP 90.0    CBG:  Recent Labs Lab 06/22/14 0751 06/22/14 1203 06/22/14 1716 06/22/14 2059 06/23/14 0739  GLUCAP 137* 193* 251* 272* 164*       Signed:  Isobel Eisenhuth  [facility] 06/23/2014, 3:40 PM

## 2014-06-25 NOTE — ED Provider Notes (Signed)
12:00 PM  Contacted by Nira Conn at [facility] lab.  She reports the patient had grown Escherichia coli on 06/19/14 on urine and blood cultures. He was admitted to the hospital 06/19/14 and treated with ceftriaxone and discharged on Keflex on 06/25/14.  I was contacted by the lab today to report that patient is also growing out gram-positive cocci in chains and a second gram-negative rod in his blood cultures. Lab was unable to give me any further specifications/sensitivity at this time and reports they will call back to the flow manager in charge nurse tomorrow. It is likely the patient is still being appropriately treated on Keflex. His urine culture and blood culture grew different strains of Escherichia coli both sensitive to Keflex. Gram-positive cocci in chains would likely also be sensitive to Keflex. I have contacted the patient at home and he reports he is feeling much better since discharge. No fevers, night sweats, chills, body aches. No chest pain. No vomiting. Reports he has follow-up tomorrow. Have advised him to return to the hospital if he has any worsening symptoms.  [place], DO 06/25/14 1210

## 2014-06-26 LAB — CULTURE, BLOOD (ROUTINE X 2)
Culture: NO GROWTH
Culture: NO GROWTH

## 2014-06-27 DIAGNOSIS — Z4502 Encounter for adjustment and management of automatic implantable cardiac defibrillator: Secondary | ICD-10-CM | POA: Diagnosis not present

## 2014-06-27 DIAGNOSIS — R001 Bradycardia, unspecified: Secondary | ICD-10-CM | POA: Diagnosis not present

## 2014-06-28 LAB — CULTURE, BLOOD (ROUTINE X 2)

## 2014-08-01 DIAGNOSIS — I255 Ischemic cardiomyopathy: Secondary | ICD-10-CM | POA: Diagnosis not present

## 2014-08-01 DIAGNOSIS — D591 Other autoimmune hemolytic anemias: Secondary | ICD-10-CM | POA: Diagnosis not present

## 2014-08-01 DIAGNOSIS — D649 Anemia, unspecified: Secondary | ICD-10-CM | POA: Diagnosis not present

## 2014-08-01 DIAGNOSIS — I5020 Unspecified systolic (congestive) heart failure: Secondary | ICD-10-CM | POA: Diagnosis not present

## 2014-08-01 DIAGNOSIS — Z79899 Other long term (current) drug therapy: Secondary | ICD-10-CM | POA: Diagnosis not present

## 2014-08-09 DIAGNOSIS — E785 Hyperlipidemia, unspecified: Secondary | ICD-10-CM | POA: Diagnosis not present

## 2014-08-09 DIAGNOSIS — Z4502 Encounter for adjustment and management of automatic implantable cardiac defibrillator: Secondary | ICD-10-CM | POA: Diagnosis not present

## 2014-08-09 DIAGNOSIS — I498 Other specified cardiac arrhythmias: Secondary | ICD-10-CM | POA: Diagnosis not present

## 2014-08-09 DIAGNOSIS — I10 Essential (primary) hypertension: Secondary | ICD-10-CM | POA: Diagnosis not present

## 2014-08-09 DIAGNOSIS — I2510 Atherosclerotic heart disease of native coronary artery without angina pectoris: Secondary | ICD-10-CM | POA: Diagnosis not present

## 2014-08-09 DIAGNOSIS — C61 Malignant neoplasm of prostate: Secondary | ICD-10-CM | POA: Diagnosis not present

## 2014-09-10 DIAGNOSIS — N529 Male erectile dysfunction, unspecified: Secondary | ICD-10-CM | POA: Diagnosis not present

## 2014-09-10 DIAGNOSIS — R300 Dysuria: Secondary | ICD-10-CM | POA: Diagnosis not present

## 2014-09-10 DIAGNOSIS — I10 Essential (primary) hypertension: Secondary | ICD-10-CM | POA: Diagnosis not present

## 2014-09-10 DIAGNOSIS — R109 Unspecified abdominal pain: Secondary | ICD-10-CM | POA: Diagnosis not present

## 2014-09-10 DIAGNOSIS — F4310 Post-traumatic stress disorder, unspecified: Secondary | ICD-10-CM | POA: Diagnosis not present

## 2014-09-10 DIAGNOSIS — N390 Urinary tract infection, site not specified: Secondary | ICD-10-CM | POA: Diagnosis not present

## 2014-10-09 DIAGNOSIS — I10 Essential (primary) hypertension: Secondary | ICD-10-CM | POA: Diagnosis not present

## 2014-10-09 DIAGNOSIS — F4310 Post-traumatic stress disorder, unspecified: Secondary | ICD-10-CM | POA: Diagnosis not present

## 2014-10-09 DIAGNOSIS — N529 Male erectile dysfunction, unspecified: Secondary | ICD-10-CM | POA: Diagnosis not present

## 2014-10-09 DIAGNOSIS — I2510 Atherosclerotic heart disease of native coronary artery without angina pectoris: Secondary | ICD-10-CM | POA: Diagnosis not present

## 2014-10-31 DIAGNOSIS — I5020 Unspecified systolic (congestive) heart failure: Secondary | ICD-10-CM | POA: Diagnosis not present

## 2014-10-31 DIAGNOSIS — Z95810 Presence of automatic (implantable) cardiac defibrillator: Secondary | ICD-10-CM | POA: Diagnosis not present

## 2014-10-31 DIAGNOSIS — I255 Ischemic cardiomyopathy: Secondary | ICD-10-CM | POA: Diagnosis not present

## 2014-10-31 DIAGNOSIS — I472 Ventricular tachycardia: Secondary | ICD-10-CM | POA: Diagnosis not present

## 2014-10-31 DIAGNOSIS — D591 Other autoimmune hemolytic anemias: Secondary | ICD-10-CM | POA: Diagnosis not present

## 2014-11-19 DIAGNOSIS — Z4502 Encounter for adjustment and management of automatic implantable cardiac defibrillator: Secondary | ICD-10-CM | POA: Diagnosis not present

## 2014-11-19 DIAGNOSIS — I498 Other specified cardiac arrhythmias: Secondary | ICD-10-CM | POA: Diagnosis not present

## 2014-12-10 DIAGNOSIS — R739 Hyperglycemia, unspecified: Secondary | ICD-10-CM | POA: Diagnosis not present

## 2014-12-10 DIAGNOSIS — C61 Malignant neoplasm of prostate: Secondary | ICD-10-CM | POA: Diagnosis not present

## 2014-12-10 DIAGNOSIS — I10 Essential (primary) hypertension: Secondary | ICD-10-CM | POA: Diagnosis not present

## 2014-12-10 DIAGNOSIS — F4310 Post-traumatic stress disorder, unspecified: Secondary | ICD-10-CM | POA: Diagnosis not present

## 2014-12-18 DIAGNOSIS — R32 Unspecified urinary incontinence: Secondary | ICD-10-CM | POA: Diagnosis not present

## 2014-12-18 DIAGNOSIS — E1142 Type 2 diabetes mellitus with diabetic polyneuropathy: Secondary | ICD-10-CM | POA: Diagnosis not present

## 2014-12-18 DIAGNOSIS — I2510 Atherosclerotic heart disease of native coronary artery without angina pectoris: Secondary | ICD-10-CM | POA: Diagnosis not present

## 2015-01-16 DIAGNOSIS — J029 Acute pharyngitis, unspecified: Secondary | ICD-10-CM | POA: Diagnosis not present

## 2015-01-16 DIAGNOSIS — R52 Pain, unspecified: Secondary | ICD-10-CM | POA: Diagnosis not present

## 2015-01-23 DIAGNOSIS — I255 Ischemic cardiomyopathy: Secondary | ICD-10-CM | POA: Diagnosis not present

## 2015-01-23 DIAGNOSIS — D591 Other autoimmune hemolytic anemias: Secondary | ICD-10-CM | POA: Diagnosis not present

## 2015-01-23 DIAGNOSIS — Z95810 Presence of automatic (implantable) cardiac defibrillator: Secondary | ICD-10-CM | POA: Diagnosis not present

## 2015-01-29 ENCOUNTER — Other Ambulatory Visit: Payer: Self-pay | Admitting: Internal Medicine

## 2015-01-29 DIAGNOSIS — N390 Urinary tract infection, site not specified: Secondary | ICD-10-CM | POA: Diagnosis not present

## 2015-01-29 DIAGNOSIS — G8929 Other chronic pain: Secondary | ICD-10-CM | POA: Diagnosis not present

## 2015-01-29 DIAGNOSIS — M25562 Pain in left knee: Secondary | ICD-10-CM | POA: Diagnosis not present

## 2015-01-29 DIAGNOSIS — R1030 Lower abdominal pain, unspecified: Secondary | ICD-10-CM | POA: Diagnosis not present

## 2015-01-29 DIAGNOSIS — M25561 Pain in right knee: Secondary | ICD-10-CM | POA: Diagnosis not present

## 2015-02-06 ENCOUNTER — Ambulatory Visit
Admission: RE | Admit: 2015-02-06 | Discharge: 2015-02-06 | Disposition: A | Discharge status: Home / Self Care | Payer: Medicare Other | Source: Ambulatory Visit | Attending: Internal Medicine | Admitting: Internal Medicine

## 2015-02-06 DIAGNOSIS — R52 Pain, unspecified: Secondary | ICD-10-CM | POA: Diagnosis not present

## 2015-02-06 DIAGNOSIS — R1030 Lower abdominal pain, unspecified: Secondary | ICD-10-CM | POA: Diagnosis not present

## 2015-02-06 DIAGNOSIS — I10 Essential (primary) hypertension: Secondary | ICD-10-CM | POA: Diagnosis not present

## 2015-02-06 DIAGNOSIS — E1142 Type 2 diabetes mellitus with diabetic polyneuropathy: Secondary | ICD-10-CM | POA: Diagnosis not present

## 2015-02-06 MED ORDER — IOPAMIDOL (ISOVUE-300) INJECTION 61%
100.0000 mL | Freq: Once | INTRAVENOUS | Status: AC | PRN
  Administered 2015-02-06: 100 mL via INTRAVENOUS

## 2015-02-19 DIAGNOSIS — Z4502 Encounter for adjustment and management of automatic implantable cardiac defibrillator: Secondary | ICD-10-CM | POA: Diagnosis not present

## 2015-02-19 DIAGNOSIS — I498 Other specified cardiac arrhythmias: Secondary | ICD-10-CM | POA: Diagnosis not present

## 2015-03-27 DIAGNOSIS — I429 Cardiomyopathy, unspecified: Secondary | ICD-10-CM | POA: Diagnosis not present

## 2015-03-27 DIAGNOSIS — G4733 Obstructive sleep apnea (adult) (pediatric): Secondary | ICD-10-CM | POA: Diagnosis not present

## 2015-03-27 DIAGNOSIS — Z4502 Encounter for adjustment and management of automatic implantable cardiac defibrillator: Secondary | ICD-10-CM | POA: Diagnosis not present

## 2015-03-27 DIAGNOSIS — I5022 Chronic systolic (congestive) heart failure: Secondary | ICD-10-CM | POA: Diagnosis not present

## 2015-03-27 DIAGNOSIS — I25110 Atherosclerotic heart disease of native coronary artery with unstable angina pectoris: Secondary | ICD-10-CM | POA: Diagnosis not present

## 2015-03-27 DIAGNOSIS — R0602 Shortness of breath: Secondary | ICD-10-CM | POA: Insufficient documentation

## 2015-03-27 DIAGNOSIS — E1040 Type 1 diabetes mellitus with diabetic neuropathy, unspecified: Secondary | ICD-10-CM | POA: Diagnosis not present

## 2015-03-27 DIAGNOSIS — I209 Angina pectoris, unspecified: Secondary | ICD-10-CM | POA: Diagnosis not present

## 2015-03-29 DIAGNOSIS — R404 Transient alteration of awareness: Secondary | ICD-10-CM | POA: Diagnosis not present

## 2015-03-29 DIAGNOSIS — R55 Syncope and collapse: Secondary | ICD-10-CM | POA: Diagnosis not present

## 2015-03-29 DIAGNOSIS — Z950 Presence of cardiac pacemaker: Secondary | ICD-10-CM | POA: Diagnosis not present

## 2015-03-29 DIAGNOSIS — R42 Dizziness and giddiness: Secondary | ICD-10-CM | POA: Diagnosis not present

## 2015-03-29 DIAGNOSIS — R079 Chest pain, unspecified: Secondary | ICD-10-CM | POA: Diagnosis not present

## 2015-03-29 DIAGNOSIS — Z87891 Personal history of nicotine dependence: Secondary | ICD-10-CM | POA: Diagnosis not present

## 2015-04-10 DIAGNOSIS — M5441 Lumbago with sciatica, right side: Secondary | ICD-10-CM | POA: Diagnosis not present

## 2015-04-10 DIAGNOSIS — M25561 Pain in right knee: Secondary | ICD-10-CM | POA: Diagnosis not present

## 2015-04-10 DIAGNOSIS — G8929 Other chronic pain: Secondary | ICD-10-CM | POA: Diagnosis not present

## 2015-04-10 DIAGNOSIS — M5442 Lumbago with sciatica, left side: Secondary | ICD-10-CM | POA: Diagnosis not present

## 2015-04-15 DIAGNOSIS — I429 Cardiomyopathy, unspecified: Secondary | ICD-10-CM | POA: Diagnosis not present

## 2015-04-15 DIAGNOSIS — I25110 Atherosclerotic heart disease of native coronary artery with unstable angina pectoris: Secondary | ICD-10-CM | POA: Diagnosis not present

## 2015-04-18 DIAGNOSIS — I429 Cardiomyopathy, unspecified: Secondary | ICD-10-CM | POA: Diagnosis not present

## 2015-04-18 DIAGNOSIS — R0602 Shortness of breath: Secondary | ICD-10-CM | POA: Diagnosis not present

## 2015-04-18 DIAGNOSIS — I25119 Atherosclerotic heart disease of native coronary artery with unspecified angina pectoris: Secondary | ICD-10-CM | POA: Diagnosis not present

## 2015-04-18 DIAGNOSIS — I25110 Atherosclerotic heart disease of native coronary artery with unstable angina pectoris: Secondary | ICD-10-CM | POA: Diagnosis not present

## 2015-04-18 DIAGNOSIS — R9439 Abnormal result of other cardiovascular function study: Secondary | ICD-10-CM | POA: Diagnosis not present

## 2015-04-18 DIAGNOSIS — I5022 Chronic systolic (congestive) heart failure: Secondary | ICD-10-CM | POA: Diagnosis not present

## 2015-04-18 DIAGNOSIS — Z01818 Encounter for other preprocedural examination: Secondary | ICD-10-CM | POA: Diagnosis not present

## 2015-04-19 DIAGNOSIS — Z7982 Long term (current) use of aspirin: Secondary | ICD-10-CM | POA: Diagnosis not present

## 2015-04-19 DIAGNOSIS — E1040 Type 1 diabetes mellitus with diabetic neuropathy, unspecified: Secondary | ICD-10-CM | POA: Diagnosis not present

## 2015-04-19 DIAGNOSIS — G4733 Obstructive sleep apnea (adult) (pediatric): Secondary | ICD-10-CM | POA: Diagnosis not present

## 2015-04-19 DIAGNOSIS — Z794 Long term (current) use of insulin: Secondary | ICD-10-CM | POA: Diagnosis not present

## 2015-04-19 DIAGNOSIS — Z9861 Coronary angioplasty status: Secondary | ICD-10-CM | POA: Diagnosis not present

## 2015-04-19 DIAGNOSIS — I208 Other forms of angina pectoris: Secondary | ICD-10-CM | POA: Insufficient documentation

## 2015-04-19 DIAGNOSIS — I200 Unstable angina: Secondary | ICD-10-CM | POA: Insufficient documentation

## 2015-04-19 DIAGNOSIS — Z79899 Other long term (current) drug therapy: Secondary | ICD-10-CM | POA: Diagnosis not present

## 2015-04-19 DIAGNOSIS — Z95810 Presence of automatic (implantable) cardiac defibrillator: Secondary | ICD-10-CM | POA: Diagnosis not present

## 2015-04-19 DIAGNOSIS — I110 Hypertensive heart disease with heart failure: Secondary | ICD-10-CM | POA: Diagnosis not present

## 2015-04-19 DIAGNOSIS — R0601 Orthopnea: Secondary | ICD-10-CM | POA: Diagnosis not present

## 2015-04-19 DIAGNOSIS — I501 Left ventricular failure: Secondary | ICD-10-CM | POA: Diagnosis not present

## 2015-04-19 DIAGNOSIS — I25118 Atherosclerotic heart disease of native coronary artery with other forms of angina pectoris: Secondary | ICD-10-CM | POA: Diagnosis not present

## 2015-04-19 DIAGNOSIS — I5022 Chronic systolic (congestive) heart failure: Secondary | ICD-10-CM | POA: Diagnosis not present

## 2015-04-19 DIAGNOSIS — R0602 Shortness of breath: Secondary | ICD-10-CM | POA: Diagnosis not present

## 2015-04-19 DIAGNOSIS — I472 Ventricular tachycardia: Secondary | ICD-10-CM | POA: Diagnosis not present

## 2015-04-19 DIAGNOSIS — I429 Cardiomyopathy, unspecified: Secondary | ICD-10-CM | POA: Diagnosis not present

## 2015-04-20 DIAGNOSIS — Z95810 Presence of automatic (implantable) cardiac defibrillator: Secondary | ICD-10-CM | POA: Diagnosis not present

## 2015-04-20 DIAGNOSIS — R0602 Shortness of breath: Secondary | ICD-10-CM | POA: Diagnosis not present

## 2015-04-20 DIAGNOSIS — I110 Hypertensive heart disease with heart failure: Secondary | ICD-10-CM | POA: Diagnosis not present

## 2015-04-20 DIAGNOSIS — I429 Cardiomyopathy, unspecified: Secondary | ICD-10-CM | POA: Diagnosis not present

## 2015-04-20 DIAGNOSIS — I25118 Atherosclerotic heart disease of native coronary artery with other forms of angina pectoris: Secondary | ICD-10-CM | POA: Diagnosis not present

## 2015-04-20 DIAGNOSIS — I10 Essential (primary) hypertension: Secondary | ICD-10-CM | POA: Diagnosis not present

## 2015-04-20 DIAGNOSIS — R0601 Orthopnea: Secondary | ICD-10-CM | POA: Diagnosis not present

## 2015-04-20 DIAGNOSIS — I501 Left ventricular failure: Secondary | ICD-10-CM | POA: Diagnosis not present

## 2015-04-20 DIAGNOSIS — E119 Type 2 diabetes mellitus without complications: Secondary | ICD-10-CM | POA: Diagnosis not present

## 2015-04-20 DIAGNOSIS — I472 Ventricular tachycardia: Secondary | ICD-10-CM | POA: Diagnosis not present

## 2015-04-23 DIAGNOSIS — I5022 Chronic systolic (congestive) heart failure: Secondary | ICD-10-CM | POA: Diagnosis not present

## 2015-04-23 DIAGNOSIS — Z950 Presence of cardiac pacemaker: Secondary | ICD-10-CM | POA: Diagnosis not present

## 2015-04-23 DIAGNOSIS — I2510 Atherosclerotic heart disease of native coronary artery without angina pectoris: Secondary | ICD-10-CM | POA: Diagnosis not present

## 2015-04-23 DIAGNOSIS — I429 Cardiomyopathy, unspecified: Secondary | ICD-10-CM | POA: Diagnosis not present

## 2015-04-23 DIAGNOSIS — I208 Other forms of angina pectoris: Secondary | ICD-10-CM | POA: Diagnosis not present

## 2015-04-25 DIAGNOSIS — I5020 Unspecified systolic (congestive) heart failure: Secondary | ICD-10-CM | POA: Diagnosis not present

## 2015-04-25 DIAGNOSIS — I472 Ventricular tachycardia: Secondary | ICD-10-CM | POA: Diagnosis not present

## 2015-04-25 DIAGNOSIS — Z7952 Long term (current) use of systemic steroids: Secondary | ICD-10-CM | POA: Diagnosis not present

## 2015-04-25 DIAGNOSIS — Z79899 Other long term (current) drug therapy: Secondary | ICD-10-CM | POA: Diagnosis not present

## 2015-04-25 DIAGNOSIS — D591 Other autoimmune hemolytic anemias: Secondary | ICD-10-CM | POA: Diagnosis not present

## 2015-04-25 DIAGNOSIS — Z955 Presence of coronary angioplasty implant and graft: Secondary | ICD-10-CM | POA: Diagnosis not present

## 2015-04-25 DIAGNOSIS — Z95810 Presence of automatic (implantable) cardiac defibrillator: Secondary | ICD-10-CM | POA: Diagnosis not present

## 2015-04-25 DIAGNOSIS — I255 Ischemic cardiomyopathy: Secondary | ICD-10-CM | POA: Diagnosis not present

## 2015-05-28 DIAGNOSIS — I25118 Atherosclerotic heart disease of native coronary artery with other forms of angina pectoris: Secondary | ICD-10-CM | POA: Diagnosis not present

## 2015-05-28 DIAGNOSIS — G4733 Obstructive sleep apnea (adult) (pediatric): Secondary | ICD-10-CM | POA: Diagnosis not present

## 2015-05-28 DIAGNOSIS — I5022 Chronic systolic (congestive) heart failure: Secondary | ICD-10-CM | POA: Diagnosis not present

## 2015-05-28 DIAGNOSIS — Z950 Presence of cardiac pacemaker: Secondary | ICD-10-CM | POA: Diagnosis not present

## 2015-05-28 DIAGNOSIS — I25110 Atherosclerotic heart disease of native coronary artery with unstable angina pectoris: Secondary | ICD-10-CM | POA: Diagnosis not present

## 2015-05-28 DIAGNOSIS — I429 Cardiomyopathy, unspecified: Secondary | ICD-10-CM | POA: Diagnosis not present

## 2015-05-29 DIAGNOSIS — Z950 Presence of cardiac pacemaker: Secondary | ICD-10-CM | POA: Diagnosis not present

## 2015-05-29 DIAGNOSIS — I5022 Chronic systolic (congestive) heart failure: Secondary | ICD-10-CM | POA: Diagnosis not present

## 2015-05-29 DIAGNOSIS — I25118 Atherosclerotic heart disease of native coronary artery with other forms of angina pectoris: Secondary | ICD-10-CM | POA: Diagnosis not present

## 2015-05-29 DIAGNOSIS — I429 Cardiomyopathy, unspecified: Secondary | ICD-10-CM | POA: Diagnosis not present

## 2015-05-30 DIAGNOSIS — I5022 Chronic systolic (congestive) heart failure: Secondary | ICD-10-CM | POA: Diagnosis not present

## 2015-05-30 DIAGNOSIS — I25118 Atherosclerotic heart disease of native coronary artery with other forms of angina pectoris: Secondary | ICD-10-CM | POA: Diagnosis not present

## 2015-05-30 DIAGNOSIS — Z950 Presence of cardiac pacemaker: Secondary | ICD-10-CM | POA: Diagnosis not present

## 2015-05-30 DIAGNOSIS — I429 Cardiomyopathy, unspecified: Secondary | ICD-10-CM | POA: Diagnosis not present

## 2015-06-05 DIAGNOSIS — I5022 Chronic systolic (congestive) heart failure: Secondary | ICD-10-CM | POA: Diagnosis not present

## 2015-06-05 DIAGNOSIS — Z950 Presence of cardiac pacemaker: Secondary | ICD-10-CM | POA: Diagnosis not present

## 2015-06-05 DIAGNOSIS — I429 Cardiomyopathy, unspecified: Secondary | ICD-10-CM | POA: Diagnosis not present

## 2015-06-05 DIAGNOSIS — I25118 Atherosclerotic heart disease of native coronary artery with other forms of angina pectoris: Secondary | ICD-10-CM | POA: Diagnosis not present

## 2015-06-06 DIAGNOSIS — Z1389 Encounter for screening for other disorder: Secondary | ICD-10-CM | POA: Diagnosis not present

## 2015-06-06 DIAGNOSIS — E1142 Type 2 diabetes mellitus with diabetic polyneuropathy: Secondary | ICD-10-CM | POA: Diagnosis not present

## 2015-06-06 DIAGNOSIS — Z950 Presence of cardiac pacemaker: Secondary | ICD-10-CM | POA: Diagnosis not present

## 2015-06-06 DIAGNOSIS — R739 Hyperglycemia, unspecified: Secondary | ICD-10-CM | POA: Diagnosis not present

## 2015-06-06 DIAGNOSIS — I25118 Atherosclerotic heart disease of native coronary artery with other forms of angina pectoris: Secondary | ICD-10-CM | POA: Diagnosis not present

## 2015-06-06 DIAGNOSIS — I429 Cardiomyopathy, unspecified: Secondary | ICD-10-CM | POA: Diagnosis not present

## 2015-06-06 DIAGNOSIS — F419 Anxiety disorder, unspecified: Secondary | ICD-10-CM | POA: Diagnosis not present

## 2015-06-06 DIAGNOSIS — I2510 Atherosclerotic heart disease of native coronary artery without angina pectoris: Secondary | ICD-10-CM | POA: Diagnosis not present

## 2015-06-06 DIAGNOSIS — F4310 Post-traumatic stress disorder, unspecified: Secondary | ICD-10-CM | POA: Diagnosis not present

## 2015-06-06 DIAGNOSIS — I209 Angina pectoris, unspecified: Secondary | ICD-10-CM | POA: Diagnosis not present

## 2015-06-06 DIAGNOSIS — I10 Essential (primary) hypertension: Secondary | ICD-10-CM | POA: Diagnosis not present

## 2015-06-06 DIAGNOSIS — I5022 Chronic systolic (congestive) heart failure: Secondary | ICD-10-CM | POA: Diagnosis not present

## 2015-06-07 DIAGNOSIS — E1142 Type 2 diabetes mellitus with diabetic polyneuropathy: Secondary | ICD-10-CM | POA: Diagnosis not present

## 2015-06-10 DIAGNOSIS — Z950 Presence of cardiac pacemaker: Secondary | ICD-10-CM | POA: Diagnosis not present

## 2015-06-10 DIAGNOSIS — I25118 Atherosclerotic heart disease of native coronary artery with other forms of angina pectoris: Secondary | ICD-10-CM | POA: Diagnosis not present

## 2015-06-10 DIAGNOSIS — I429 Cardiomyopathy, unspecified: Secondary | ICD-10-CM | POA: Diagnosis not present

## 2015-06-10 DIAGNOSIS — I5022 Chronic systolic (congestive) heart failure: Secondary | ICD-10-CM | POA: Diagnosis not present

## 2015-06-12 DIAGNOSIS — I429 Cardiomyopathy, unspecified: Secondary | ICD-10-CM | POA: Diagnosis not present

## 2015-06-12 DIAGNOSIS — I5022 Chronic systolic (congestive) heart failure: Secondary | ICD-10-CM | POA: Diagnosis not present

## 2015-06-12 DIAGNOSIS — Z950 Presence of cardiac pacemaker: Secondary | ICD-10-CM | POA: Diagnosis not present

## 2015-06-12 DIAGNOSIS — I25118 Atherosclerotic heart disease of native coronary artery with other forms of angina pectoris: Secondary | ICD-10-CM | POA: Diagnosis not present

## 2015-06-13 DIAGNOSIS — I5022 Chronic systolic (congestive) heart failure: Secondary | ICD-10-CM | POA: Diagnosis not present

## 2015-06-13 DIAGNOSIS — I25118 Atherosclerotic heart disease of native coronary artery with other forms of angina pectoris: Secondary | ICD-10-CM | POA: Diagnosis not present

## 2015-06-13 DIAGNOSIS — Z950 Presence of cardiac pacemaker: Secondary | ICD-10-CM | POA: Diagnosis not present

## 2015-06-13 DIAGNOSIS — I429 Cardiomyopathy, unspecified: Secondary | ICD-10-CM | POA: Diagnosis not present

## 2015-06-17 DIAGNOSIS — I429 Cardiomyopathy, unspecified: Secondary | ICD-10-CM | POA: Diagnosis not present

## 2015-06-17 DIAGNOSIS — I5022 Chronic systolic (congestive) heart failure: Secondary | ICD-10-CM | POA: Diagnosis not present

## 2015-06-17 DIAGNOSIS — Z950 Presence of cardiac pacemaker: Secondary | ICD-10-CM | POA: Diagnosis not present

## 2015-06-17 DIAGNOSIS — I25118 Atherosclerotic heart disease of native coronary artery with other forms of angina pectoris: Secondary | ICD-10-CM | POA: Diagnosis not present

## 2015-06-19 DIAGNOSIS — I429 Cardiomyopathy, unspecified: Secondary | ICD-10-CM | POA: Diagnosis not present

## 2015-06-19 DIAGNOSIS — I25118 Atherosclerotic heart disease of native coronary artery with other forms of angina pectoris: Secondary | ICD-10-CM | POA: Diagnosis not present

## 2015-06-19 DIAGNOSIS — Z950 Presence of cardiac pacemaker: Secondary | ICD-10-CM | POA: Diagnosis not present

## 2015-06-19 DIAGNOSIS — I5022 Chronic systolic (congestive) heart failure: Secondary | ICD-10-CM | POA: Diagnosis not present

## 2015-06-24 DIAGNOSIS — I25118 Atherosclerotic heart disease of native coronary artery with other forms of angina pectoris: Secondary | ICD-10-CM | POA: Diagnosis not present

## 2015-06-24 DIAGNOSIS — I429 Cardiomyopathy, unspecified: Secondary | ICD-10-CM | POA: Diagnosis not present

## 2015-06-24 DIAGNOSIS — I5022 Chronic systolic (congestive) heart failure: Secondary | ICD-10-CM | POA: Diagnosis not present

## 2015-06-24 DIAGNOSIS — Z950 Presence of cardiac pacemaker: Secondary | ICD-10-CM | POA: Diagnosis not present

## 2015-06-26 DIAGNOSIS — Z950 Presence of cardiac pacemaker: Secondary | ICD-10-CM | POA: Diagnosis not present

## 2015-06-26 DIAGNOSIS — I25118 Atherosclerotic heart disease of native coronary artery with other forms of angina pectoris: Secondary | ICD-10-CM | POA: Diagnosis not present

## 2015-06-26 DIAGNOSIS — I5022 Chronic systolic (congestive) heart failure: Secondary | ICD-10-CM | POA: Diagnosis not present

## 2015-06-26 DIAGNOSIS — I429 Cardiomyopathy, unspecified: Secondary | ICD-10-CM | POA: Diagnosis not present

## 2015-07-01 DIAGNOSIS — Z950 Presence of cardiac pacemaker: Secondary | ICD-10-CM | POA: Diagnosis not present

## 2015-07-01 DIAGNOSIS — I5022 Chronic systolic (congestive) heart failure: Secondary | ICD-10-CM | POA: Diagnosis not present

## 2015-07-01 DIAGNOSIS — I25118 Atherosclerotic heart disease of native coronary artery with other forms of angina pectoris: Secondary | ICD-10-CM | POA: Diagnosis not present

## 2015-07-01 DIAGNOSIS — I429 Cardiomyopathy, unspecified: Secondary | ICD-10-CM | POA: Diagnosis not present

## 2015-07-03 DIAGNOSIS — I5022 Chronic systolic (congestive) heart failure: Secondary | ICD-10-CM | POA: Diagnosis not present

## 2015-07-03 DIAGNOSIS — Z950 Presence of cardiac pacemaker: Secondary | ICD-10-CM | POA: Diagnosis not present

## 2015-07-03 DIAGNOSIS — I429 Cardiomyopathy, unspecified: Secondary | ICD-10-CM | POA: Diagnosis not present

## 2015-07-03 DIAGNOSIS — I25118 Atherosclerotic heart disease of native coronary artery with other forms of angina pectoris: Secondary | ICD-10-CM | POA: Diagnosis not present

## 2015-07-04 DIAGNOSIS — I25118 Atherosclerotic heart disease of native coronary artery with other forms of angina pectoris: Secondary | ICD-10-CM | POA: Diagnosis not present

## 2015-07-04 DIAGNOSIS — I429 Cardiomyopathy, unspecified: Secondary | ICD-10-CM | POA: Diagnosis not present

## 2015-07-04 DIAGNOSIS — Z950 Presence of cardiac pacemaker: Secondary | ICD-10-CM | POA: Diagnosis not present

## 2015-07-04 DIAGNOSIS — I5022 Chronic systolic (congestive) heart failure: Secondary | ICD-10-CM | POA: Diagnosis not present

## 2015-07-08 DIAGNOSIS — Z950 Presence of cardiac pacemaker: Secondary | ICD-10-CM | POA: Diagnosis not present

## 2015-07-08 DIAGNOSIS — I25118 Atherosclerotic heart disease of native coronary artery with other forms of angina pectoris: Secondary | ICD-10-CM | POA: Diagnosis not present

## 2015-07-08 DIAGNOSIS — I5022 Chronic systolic (congestive) heart failure: Secondary | ICD-10-CM | POA: Diagnosis not present

## 2015-07-08 DIAGNOSIS — I429 Cardiomyopathy, unspecified: Secondary | ICD-10-CM | POA: Diagnosis not present

## 2015-07-08 DIAGNOSIS — Z4502 Encounter for adjustment and management of automatic implantable cardiac defibrillator: Secondary | ICD-10-CM | POA: Diagnosis not present

## 2015-07-11 DIAGNOSIS — I5022 Chronic systolic (congestive) heart failure: Secondary | ICD-10-CM | POA: Diagnosis not present

## 2015-07-11 DIAGNOSIS — Z950 Presence of cardiac pacemaker: Secondary | ICD-10-CM | POA: Diagnosis not present

## 2015-07-11 DIAGNOSIS — I25118 Atherosclerotic heart disease of native coronary artery with other forms of angina pectoris: Secondary | ICD-10-CM | POA: Diagnosis not present

## 2015-07-11 DIAGNOSIS — I429 Cardiomyopathy, unspecified: Secondary | ICD-10-CM | POA: Diagnosis not present

## 2015-07-17 DIAGNOSIS — I5022 Chronic systolic (congestive) heart failure: Secondary | ICD-10-CM | POA: Diagnosis not present

## 2015-07-17 DIAGNOSIS — Z950 Presence of cardiac pacemaker: Secondary | ICD-10-CM | POA: Diagnosis not present

## 2015-07-17 DIAGNOSIS — I25118 Atherosclerotic heart disease of native coronary artery with other forms of angina pectoris: Secondary | ICD-10-CM | POA: Diagnosis not present

## 2015-07-17 DIAGNOSIS — I429 Cardiomyopathy, unspecified: Secondary | ICD-10-CM | POA: Diagnosis not present

## 2015-07-18 DIAGNOSIS — I5022 Chronic systolic (congestive) heart failure: Secondary | ICD-10-CM | POA: Diagnosis not present

## 2015-07-18 DIAGNOSIS — Z950 Presence of cardiac pacemaker: Secondary | ICD-10-CM | POA: Diagnosis not present

## 2015-07-18 DIAGNOSIS — K3184 Gastroparesis: Secondary | ICD-10-CM | POA: Insufficient documentation

## 2015-07-18 DIAGNOSIS — E1143 Type 2 diabetes mellitus with diabetic autonomic (poly)neuropathy: Secondary | ICD-10-CM | POA: Insufficient documentation

## 2015-07-18 DIAGNOSIS — I429 Cardiomyopathy, unspecified: Secondary | ICD-10-CM | POA: Diagnosis not present

## 2015-07-18 DIAGNOSIS — I25118 Atherosclerotic heart disease of native coronary artery with other forms of angina pectoris: Secondary | ICD-10-CM | POA: Diagnosis not present

## 2015-07-22 DIAGNOSIS — Z950 Presence of cardiac pacemaker: Secondary | ICD-10-CM | POA: Diagnosis not present

## 2015-07-22 DIAGNOSIS — I429 Cardiomyopathy, unspecified: Secondary | ICD-10-CM | POA: Diagnosis not present

## 2015-07-22 DIAGNOSIS — I5022 Chronic systolic (congestive) heart failure: Secondary | ICD-10-CM | POA: Diagnosis not present

## 2015-07-22 DIAGNOSIS — I25118 Atherosclerotic heart disease of native coronary artery with other forms of angina pectoris: Secondary | ICD-10-CM | POA: Diagnosis not present

## 2015-07-25 DIAGNOSIS — I5022 Chronic systolic (congestive) heart failure: Secondary | ICD-10-CM | POA: Diagnosis not present

## 2015-07-25 DIAGNOSIS — I429 Cardiomyopathy, unspecified: Secondary | ICD-10-CM | POA: Diagnosis not present

## 2015-07-25 DIAGNOSIS — I25118 Atherosclerotic heart disease of native coronary artery with other forms of angina pectoris: Secondary | ICD-10-CM | POA: Diagnosis not present

## 2015-07-25 DIAGNOSIS — Z950 Presence of cardiac pacemaker: Secondary | ICD-10-CM | POA: Diagnosis not present

## 2015-07-29 DIAGNOSIS — I25118 Atherosclerotic heart disease of native coronary artery with other forms of angina pectoris: Secondary | ICD-10-CM | POA: Diagnosis not present

## 2015-07-29 DIAGNOSIS — Z950 Presence of cardiac pacemaker: Secondary | ICD-10-CM | POA: Diagnosis not present

## 2015-07-29 DIAGNOSIS — I429 Cardiomyopathy, unspecified: Secondary | ICD-10-CM | POA: Diagnosis not present

## 2015-07-29 DIAGNOSIS — I5022 Chronic systolic (congestive) heart failure: Secondary | ICD-10-CM | POA: Diagnosis not present

## 2015-08-01 DIAGNOSIS — I25118 Atherosclerotic heart disease of native coronary artery with other forms of angina pectoris: Secondary | ICD-10-CM | POA: Diagnosis not present

## 2015-08-01 DIAGNOSIS — I429 Cardiomyopathy, unspecified: Secondary | ICD-10-CM | POA: Diagnosis not present

## 2015-08-01 DIAGNOSIS — Z950 Presence of cardiac pacemaker: Secondary | ICD-10-CM | POA: Diagnosis not present

## 2015-08-01 DIAGNOSIS — I5022 Chronic systolic (congestive) heart failure: Secondary | ICD-10-CM | POA: Diagnosis not present

## 2015-08-05 DIAGNOSIS — I429 Cardiomyopathy, unspecified: Secondary | ICD-10-CM | POA: Diagnosis not present

## 2015-08-05 DIAGNOSIS — I25118 Atherosclerotic heart disease of native coronary artery with other forms of angina pectoris: Secondary | ICD-10-CM | POA: Diagnosis not present

## 2015-08-05 DIAGNOSIS — I5022 Chronic systolic (congestive) heart failure: Secondary | ICD-10-CM | POA: Diagnosis not present

## 2015-08-05 DIAGNOSIS — Z950 Presence of cardiac pacemaker: Secondary | ICD-10-CM | POA: Diagnosis not present

## 2015-08-07 DIAGNOSIS — I5022 Chronic systolic (congestive) heart failure: Secondary | ICD-10-CM | POA: Diagnosis not present

## 2015-08-07 DIAGNOSIS — I429 Cardiomyopathy, unspecified: Secondary | ICD-10-CM | POA: Diagnosis not present

## 2015-08-07 DIAGNOSIS — I25118 Atherosclerotic heart disease of native coronary artery with other forms of angina pectoris: Secondary | ICD-10-CM | POA: Diagnosis not present

## 2015-08-07 DIAGNOSIS — Z950 Presence of cardiac pacemaker: Secondary | ICD-10-CM | POA: Diagnosis not present

## 2015-08-08 DIAGNOSIS — I25118 Atherosclerotic heart disease of native coronary artery with other forms of angina pectoris: Secondary | ICD-10-CM | POA: Diagnosis not present

## 2015-08-08 DIAGNOSIS — I5022 Chronic systolic (congestive) heart failure: Secondary | ICD-10-CM | POA: Diagnosis not present

## 2015-08-08 DIAGNOSIS — Z950 Presence of cardiac pacemaker: Secondary | ICD-10-CM | POA: Diagnosis not present

## 2015-08-08 DIAGNOSIS — I429 Cardiomyopathy, unspecified: Secondary | ICD-10-CM | POA: Diagnosis not present

## 2015-08-12 DIAGNOSIS — Z950 Presence of cardiac pacemaker: Secondary | ICD-10-CM | POA: Diagnosis not present

## 2015-08-12 DIAGNOSIS — I429 Cardiomyopathy, unspecified: Secondary | ICD-10-CM | POA: Diagnosis not present

## 2015-08-12 DIAGNOSIS — I5022 Chronic systolic (congestive) heart failure: Secondary | ICD-10-CM | POA: Diagnosis not present

## 2015-08-12 DIAGNOSIS — I25118 Atherosclerotic heart disease of native coronary artery with other forms of angina pectoris: Secondary | ICD-10-CM | POA: Diagnosis not present

## 2015-08-14 DIAGNOSIS — I429 Cardiomyopathy, unspecified: Secondary | ICD-10-CM | POA: Diagnosis not present

## 2015-08-14 DIAGNOSIS — Z950 Presence of cardiac pacemaker: Secondary | ICD-10-CM | POA: Diagnosis not present

## 2015-08-14 DIAGNOSIS — I5022 Chronic systolic (congestive) heart failure: Secondary | ICD-10-CM | POA: Diagnosis not present

## 2015-08-14 DIAGNOSIS — I25118 Atherosclerotic heart disease of native coronary artery with other forms of angina pectoris: Secondary | ICD-10-CM | POA: Diagnosis not present

## 2015-08-15 DIAGNOSIS — Z950 Presence of cardiac pacemaker: Secondary | ICD-10-CM | POA: Diagnosis not present

## 2015-08-15 DIAGNOSIS — I5022 Chronic systolic (congestive) heart failure: Secondary | ICD-10-CM | POA: Diagnosis not present

## 2015-08-15 DIAGNOSIS — I25118 Atherosclerotic heart disease of native coronary artery with other forms of angina pectoris: Secondary | ICD-10-CM | POA: Diagnosis not present

## 2015-08-15 DIAGNOSIS — I429 Cardiomyopathy, unspecified: Secondary | ICD-10-CM | POA: Diagnosis not present

## 2015-08-19 DIAGNOSIS — I429 Cardiomyopathy, unspecified: Secondary | ICD-10-CM | POA: Diagnosis not present

## 2015-08-19 DIAGNOSIS — I5022 Chronic systolic (congestive) heart failure: Secondary | ICD-10-CM | POA: Diagnosis not present

## 2015-08-19 DIAGNOSIS — I25118 Atherosclerotic heart disease of native coronary artery with other forms of angina pectoris: Secondary | ICD-10-CM | POA: Diagnosis not present

## 2015-08-19 DIAGNOSIS — Z950 Presence of cardiac pacemaker: Secondary | ICD-10-CM | POA: Diagnosis not present

## 2015-08-21 DIAGNOSIS — I25118 Atherosclerotic heart disease of native coronary artery with other forms of angina pectoris: Secondary | ICD-10-CM | POA: Diagnosis not present

## 2015-08-21 DIAGNOSIS — I5022 Chronic systolic (congestive) heart failure: Secondary | ICD-10-CM | POA: Diagnosis not present

## 2015-08-21 DIAGNOSIS — Z950 Presence of cardiac pacemaker: Secondary | ICD-10-CM | POA: Diagnosis not present

## 2015-08-21 DIAGNOSIS — I429 Cardiomyopathy, unspecified: Secondary | ICD-10-CM | POA: Diagnosis not present

## 2015-08-22 DIAGNOSIS — I25118 Atherosclerotic heart disease of native coronary artery with other forms of angina pectoris: Secondary | ICD-10-CM | POA: Diagnosis not present

## 2015-08-22 DIAGNOSIS — I5022 Chronic systolic (congestive) heart failure: Secondary | ICD-10-CM | POA: Diagnosis not present

## 2015-08-22 DIAGNOSIS — Z950 Presence of cardiac pacemaker: Secondary | ICD-10-CM | POA: Diagnosis not present

## 2015-08-22 DIAGNOSIS — I429 Cardiomyopathy, unspecified: Secondary | ICD-10-CM | POA: Diagnosis not present

## 2015-08-26 DIAGNOSIS — I25118 Atherosclerotic heart disease of native coronary artery with other forms of angina pectoris: Secondary | ICD-10-CM | POA: Diagnosis not present

## 2015-08-26 DIAGNOSIS — Z950 Presence of cardiac pacemaker: Secondary | ICD-10-CM | POA: Diagnosis not present

## 2015-08-26 DIAGNOSIS — I429 Cardiomyopathy, unspecified: Secondary | ICD-10-CM | POA: Diagnosis not present

## 2015-08-26 DIAGNOSIS — I5022 Chronic systolic (congestive) heart failure: Secondary | ICD-10-CM | POA: Diagnosis not present

## 2015-08-28 DIAGNOSIS — I5022 Chronic systolic (congestive) heart failure: Secondary | ICD-10-CM | POA: Diagnosis not present

## 2015-08-28 DIAGNOSIS — Z950 Presence of cardiac pacemaker: Secondary | ICD-10-CM | POA: Diagnosis not present

## 2015-08-28 DIAGNOSIS — I25118 Atherosclerotic heart disease of native coronary artery with other forms of angina pectoris: Secondary | ICD-10-CM | POA: Diagnosis not present

## 2015-08-28 DIAGNOSIS — I429 Cardiomyopathy, unspecified: Secondary | ICD-10-CM | POA: Diagnosis not present

## 2015-08-29 DIAGNOSIS — I25118 Atherosclerotic heart disease of native coronary artery with other forms of angina pectoris: Secondary | ICD-10-CM | POA: Diagnosis not present

## 2015-08-29 DIAGNOSIS — Z950 Presence of cardiac pacemaker: Secondary | ICD-10-CM | POA: Diagnosis not present

## 2015-08-29 DIAGNOSIS — I5022 Chronic systolic (congestive) heart failure: Secondary | ICD-10-CM | POA: Diagnosis not present

## 2015-08-29 DIAGNOSIS — I429 Cardiomyopathy, unspecified: Secondary | ICD-10-CM | POA: Diagnosis not present

## 2015-09-02 DIAGNOSIS — Z950 Presence of cardiac pacemaker: Secondary | ICD-10-CM | POA: Diagnosis not present

## 2015-09-02 DIAGNOSIS — I429 Cardiomyopathy, unspecified: Secondary | ICD-10-CM | POA: Diagnosis not present

## 2015-09-02 DIAGNOSIS — I5022 Chronic systolic (congestive) heart failure: Secondary | ICD-10-CM | POA: Diagnosis not present

## 2015-09-02 DIAGNOSIS — I25118 Atherosclerotic heart disease of native coronary artery with other forms of angina pectoris: Secondary | ICD-10-CM | POA: Diagnosis not present

## 2015-09-02 DIAGNOSIS — Z4502 Encounter for adjustment and management of automatic implantable cardiac defibrillator: Secondary | ICD-10-CM | POA: Diagnosis not present

## 2015-09-02 DIAGNOSIS — Z95810 Presence of automatic (implantable) cardiac defibrillator: Secondary | ICD-10-CM | POA: Diagnosis not present

## 2015-09-02 DIAGNOSIS — I255 Ischemic cardiomyopathy: Secondary | ICD-10-CM | POA: Diagnosis not present

## 2015-09-26 DIAGNOSIS — E118 Type 2 diabetes mellitus with unspecified complications: Secondary | ICD-10-CM | POA: Diagnosis not present

## 2015-09-26 DIAGNOSIS — I255 Ischemic cardiomyopathy: Secondary | ICD-10-CM | POA: Diagnosis not present

## 2015-09-26 DIAGNOSIS — K3184 Gastroparesis: Secondary | ICD-10-CM | POA: Diagnosis not present

## 2015-09-26 DIAGNOSIS — I2510 Atherosclerotic heart disease of native coronary artery without angina pectoris: Secondary | ICD-10-CM | POA: Diagnosis not present

## 2015-09-26 DIAGNOSIS — G4733 Obstructive sleep apnea (adult) (pediatric): Secondary | ICD-10-CM | POA: Diagnosis not present

## 2015-09-26 DIAGNOSIS — Z4502 Encounter for adjustment and management of automatic implantable cardiac defibrillator: Secondary | ICD-10-CM | POA: Diagnosis not present

## 2015-09-26 DIAGNOSIS — E1143 Type 2 diabetes mellitus with diabetic autonomic (poly)neuropathy: Secondary | ICD-10-CM | POA: Diagnosis not present

## 2015-09-26 DIAGNOSIS — I25110 Atherosclerotic heart disease of native coronary artery with unstable angina pectoris: Secondary | ICD-10-CM | POA: Diagnosis not present

## 2015-09-26 DIAGNOSIS — I5022 Chronic systolic (congestive) heart failure: Secondary | ICD-10-CM | POA: Diagnosis not present

## 2015-09-26 DIAGNOSIS — E1040 Type 1 diabetes mellitus with diabetic neuropathy, unspecified: Secondary | ICD-10-CM | POA: Diagnosis not present

## 2015-09-26 DIAGNOSIS — G4730 Sleep apnea, unspecified: Secondary | ICD-10-CM | POA: Diagnosis not present

## 2015-10-04 DIAGNOSIS — I255 Ischemic cardiomyopathy: Secondary | ICD-10-CM | POA: Insufficient documentation

## 2015-10-14 DIAGNOSIS — Z950 Presence of cardiac pacemaker: Secondary | ICD-10-CM | POA: Diagnosis not present

## 2015-10-14 DIAGNOSIS — G4733 Obstructive sleep apnea (adult) (pediatric): Secondary | ICD-10-CM | POA: Diagnosis not present

## 2015-10-14 DIAGNOSIS — E118 Type 2 diabetes mellitus with unspecified complications: Secondary | ICD-10-CM | POA: Diagnosis not present

## 2015-10-14 DIAGNOSIS — I253 Aneurysm of heart: Secondary | ICD-10-CM | POA: Diagnosis not present

## 2015-10-14 DIAGNOSIS — I517 Cardiomegaly: Secondary | ICD-10-CM | POA: Diagnosis not present

## 2015-10-14 DIAGNOSIS — I10 Essential (primary) hypertension: Secondary | ICD-10-CM | POA: Diagnosis not present

## 2015-10-14 DIAGNOSIS — I255 Ischemic cardiomyopathy: Secondary | ICD-10-CM | POA: Diagnosis not present

## 2015-10-14 DIAGNOSIS — E1143 Type 2 diabetes mellitus with diabetic autonomic (poly)neuropathy: Secondary | ICD-10-CM | POA: Diagnosis not present

## 2015-10-14 DIAGNOSIS — Z4502 Encounter for adjustment and management of automatic implantable cardiac defibrillator: Secondary | ICD-10-CM | POA: Diagnosis not present

## 2015-10-14 DIAGNOSIS — I5022 Chronic systolic (congestive) heart failure: Secondary | ICD-10-CM | POA: Diagnosis not present

## 2015-10-14 DIAGNOSIS — M47814 Spondylosis without myelopathy or radiculopathy, thoracic region: Secondary | ICD-10-CM | POA: Diagnosis not present

## 2015-10-14 DIAGNOSIS — E1040 Type 1 diabetes mellitus with diabetic neuropathy, unspecified: Secondary | ICD-10-CM | POA: Diagnosis not present

## 2015-10-14 DIAGNOSIS — I2510 Atherosclerotic heart disease of native coronary artery without angina pectoris: Secondary | ICD-10-CM | POA: Diagnosis not present

## 2015-10-14 DIAGNOSIS — I25110 Atherosclerotic heart disease of native coronary artery with unstable angina pectoris: Secondary | ICD-10-CM | POA: Diagnosis not present

## 2015-10-14 DIAGNOSIS — K3184 Gastroparesis: Secondary | ICD-10-CM | POA: Diagnosis not present

## 2015-10-14 DIAGNOSIS — Z01818 Encounter for other preprocedural examination: Secondary | ICD-10-CM | POA: Diagnosis not present

## 2015-10-14 DIAGNOSIS — G4730 Sleep apnea, unspecified: Secondary | ICD-10-CM | POA: Diagnosis not present

## 2015-10-16 DIAGNOSIS — I501 Left ventricular failure: Secondary | ICD-10-CM | POA: Diagnosis not present

## 2015-10-16 DIAGNOSIS — Z79899 Other long term (current) drug therapy: Secondary | ICD-10-CM | POA: Diagnosis not present

## 2015-10-16 DIAGNOSIS — Z794 Long term (current) use of insulin: Secondary | ICD-10-CM | POA: Diagnosis not present

## 2015-10-16 DIAGNOSIS — I5022 Chronic systolic (congestive) heart failure: Secondary | ICD-10-CM | POA: Diagnosis not present

## 2015-10-16 DIAGNOSIS — E1042 Type 1 diabetes mellitus with diabetic polyneuropathy: Secondary | ICD-10-CM | POA: Diagnosis not present

## 2015-10-16 DIAGNOSIS — Z9889 Other specified postprocedural states: Secondary | ICD-10-CM | POA: Diagnosis not present

## 2015-10-16 DIAGNOSIS — I110 Hypertensive heart disease with heart failure: Secondary | ICD-10-CM | POA: Diagnosis not present

## 2015-10-16 DIAGNOSIS — K219 Gastro-esophageal reflux disease without esophagitis: Secondary | ICD-10-CM | POA: Diagnosis not present

## 2015-10-16 DIAGNOSIS — Z7982 Long term (current) use of aspirin: Secondary | ICD-10-CM | POA: Diagnosis not present

## 2015-10-16 DIAGNOSIS — E669 Obesity, unspecified: Secondary | ICD-10-CM | POA: Diagnosis not present

## 2015-10-16 DIAGNOSIS — Z87891 Personal history of nicotine dependence: Secondary | ICD-10-CM | POA: Diagnosis not present

## 2015-10-16 DIAGNOSIS — Z4502 Encounter for adjustment and management of automatic implantable cardiac defibrillator: Secondary | ICD-10-CM | POA: Diagnosis not present

## 2015-10-16 DIAGNOSIS — I499 Cardiac arrhythmia, unspecified: Secondary | ICD-10-CM | POA: Diagnosis not present

## 2015-10-16 DIAGNOSIS — I255 Ischemic cardiomyopathy: Secondary | ICD-10-CM | POA: Diagnosis not present

## 2015-10-16 DIAGNOSIS — G4733 Obstructive sleep apnea (adult) (pediatric): Secondary | ICD-10-CM | POA: Diagnosis not present

## 2015-10-17 DIAGNOSIS — Z4502 Encounter for adjustment and management of automatic implantable cardiac defibrillator: Secondary | ICD-10-CM | POA: Diagnosis not present

## 2015-10-17 DIAGNOSIS — G4733 Obstructive sleep apnea (adult) (pediatric): Secondary | ICD-10-CM | POA: Diagnosis not present

## 2015-10-17 DIAGNOSIS — I5022 Chronic systolic (congestive) heart failure: Secondary | ICD-10-CM | POA: Diagnosis not present

## 2015-10-17 DIAGNOSIS — K219 Gastro-esophageal reflux disease without esophagitis: Secondary | ICD-10-CM | POA: Diagnosis not present

## 2015-10-17 DIAGNOSIS — I255 Ischemic cardiomyopathy: Secondary | ICD-10-CM | POA: Diagnosis not present

## 2015-10-17 DIAGNOSIS — I110 Hypertensive heart disease with heart failure: Secondary | ICD-10-CM | POA: Diagnosis not present

## 2015-10-23 ENCOUNTER — Other Ambulatory Visit: Payer: Self-pay

## 2015-10-28 DIAGNOSIS — Z79899 Other long term (current) drug therapy: Secondary | ICD-10-CM | POA: Diagnosis not present

## 2015-10-28 DIAGNOSIS — Z6841 Body Mass Index (BMI) 40.0 and over, adult: Secondary | ICD-10-CM | POA: Diagnosis not present

## 2015-10-28 DIAGNOSIS — I255 Ischemic cardiomyopathy: Secondary | ICD-10-CM | POA: Diagnosis not present

## 2015-10-28 DIAGNOSIS — D591 Other autoimmune hemolytic anemias: Secondary | ICD-10-CM | POA: Diagnosis not present

## 2015-10-29 DIAGNOSIS — Z4502 Encounter for adjustment and management of automatic implantable cardiac defibrillator: Secondary | ICD-10-CM | POA: Diagnosis not present

## 2015-10-29 DIAGNOSIS — I255 Ischemic cardiomyopathy: Secondary | ICD-10-CM | POA: Diagnosis not present

## 2015-10-30 DIAGNOSIS — Z79891 Long term (current) use of opiate analgesic: Secondary | ICD-10-CM | POA: Diagnosis not present

## 2015-11-11 DIAGNOSIS — E118 Type 2 diabetes mellitus with unspecified complications: Secondary | ICD-10-CM | POA: Diagnosis not present

## 2015-11-11 DIAGNOSIS — F4310 Post-traumatic stress disorder, unspecified: Secondary | ICD-10-CM | POA: Diagnosis not present

## 2015-11-11 DIAGNOSIS — C61 Malignant neoplasm of prostate: Secondary | ICD-10-CM | POA: Diagnosis not present

## 2015-11-11 DIAGNOSIS — Z23 Encounter for immunization: Secondary | ICD-10-CM | POA: Diagnosis not present

## 2015-11-11 DIAGNOSIS — I10 Essential (primary) hypertension: Secondary | ICD-10-CM | POA: Diagnosis not present

## 2015-12-24 DIAGNOSIS — E118 Type 2 diabetes mellitus with unspecified complications: Secondary | ICD-10-CM | POA: Diagnosis not present

## 2015-12-24 DIAGNOSIS — I10 Essential (primary) hypertension: Secondary | ICD-10-CM | POA: Diagnosis not present

## 2015-12-24 DIAGNOSIS — C61 Malignant neoplasm of prostate: Secondary | ICD-10-CM | POA: Diagnosis not present

## 2015-12-24 DIAGNOSIS — N390 Urinary tract infection, site not specified: Secondary | ICD-10-CM | POA: Diagnosis not present

## 2015-12-30 DIAGNOSIS — C61 Malignant neoplasm of prostate: Secondary | ICD-10-CM | POA: Diagnosis not present

## 2015-12-30 DIAGNOSIS — I10 Essential (primary) hypertension: Secondary | ICD-10-CM | POA: Diagnosis not present

## 2015-12-30 DIAGNOSIS — R739 Hyperglycemia, unspecified: Secondary | ICD-10-CM | POA: Diagnosis not present

## 2015-12-30 DIAGNOSIS — F4310 Post-traumatic stress disorder, unspecified: Secondary | ICD-10-CM | POA: Diagnosis not present

## 2016-01-08 DIAGNOSIS — I255 Ischemic cardiomyopathy: Secondary | ICD-10-CM | POA: Diagnosis not present

## 2016-01-08 DIAGNOSIS — I25119 Atherosclerotic heart disease of native coronary artery with unspecified angina pectoris: Secondary | ICD-10-CM | POA: Diagnosis not present

## 2016-01-08 DIAGNOSIS — M79604 Pain in right leg: Secondary | ICD-10-CM | POA: Diagnosis not present

## 2016-01-08 DIAGNOSIS — M79605 Pain in left leg: Secondary | ICD-10-CM | POA: Diagnosis not present

## 2016-01-08 DIAGNOSIS — Z95810 Presence of automatic (implantable) cardiac defibrillator: Secondary | ICD-10-CM | POA: Diagnosis not present

## 2016-01-08 DIAGNOSIS — I5022 Chronic systolic (congestive) heart failure: Secondary | ICD-10-CM | POA: Diagnosis not present

## 2016-01-17 DIAGNOSIS — M79604 Pain in right leg: Secondary | ICD-10-CM | POA: Diagnosis not present

## 2016-01-17 DIAGNOSIS — M79605 Pain in left leg: Secondary | ICD-10-CM | POA: Diagnosis not present

## 2016-01-23 DIAGNOSIS — R0602 Shortness of breath: Secondary | ICD-10-CM | POA: Diagnosis not present

## 2016-01-23 DIAGNOSIS — I208 Other forms of angina pectoris: Secondary | ICD-10-CM | POA: Diagnosis not present

## 2016-01-23 DIAGNOSIS — I25119 Atherosclerotic heart disease of native coronary artery with unspecified angina pectoris: Secondary | ICD-10-CM | POA: Diagnosis not present

## 2016-01-27 DIAGNOSIS — I10 Essential (primary) hypertension: Secondary | ICD-10-CM | POA: Diagnosis not present

## 2016-01-27 DIAGNOSIS — C61 Malignant neoplasm of prostate: Secondary | ICD-10-CM | POA: Diagnosis not present

## 2016-01-27 DIAGNOSIS — E118 Type 2 diabetes mellitus with unspecified complications: Secondary | ICD-10-CM | POA: Diagnosis not present

## 2016-01-27 DIAGNOSIS — F4310 Post-traumatic stress disorder, unspecified: Secondary | ICD-10-CM | POA: Diagnosis not present

## 2016-02-12 ENCOUNTER — Encounter (HOSPITAL_BASED_OUTPATIENT_CLINIC_OR_DEPARTMENT_OTHER): Payer: Self-pay | Admitting: *Deleted

## 2016-02-12 ENCOUNTER — Emergency Department (HOSPITAL_BASED_OUTPATIENT_CLINIC_OR_DEPARTMENT_OTHER): Payer: Medicare Other

## 2016-02-12 ENCOUNTER — Emergency Department (HOSPITAL_BASED_OUTPATIENT_CLINIC_OR_DEPARTMENT_OTHER)
Admission: EM | Admit: 2016-02-12 | Discharge: 2016-02-13 | Disposition: A | Discharge status: Home / Self Care | Payer: Medicare Other | Attending: Emergency Medicine | Admitting: Emergency Medicine

## 2016-02-12 DIAGNOSIS — Z79899 Other long term (current) drug therapy: Secondary | ICD-10-CM | POA: Insufficient documentation

## 2016-02-12 DIAGNOSIS — J189 Pneumonia, unspecified organism: Secondary | ICD-10-CM

## 2016-02-12 DIAGNOSIS — I110 Hypertensive heart disease with heart failure: Secondary | ICD-10-CM | POA: Diagnosis not present

## 2016-02-12 DIAGNOSIS — E119 Type 2 diabetes mellitus without complications: Secondary | ICD-10-CM | POA: Insufficient documentation

## 2016-02-12 DIAGNOSIS — Z8546 Personal history of malignant neoplasm of prostate: Secondary | ICD-10-CM | POA: Diagnosis not present

## 2016-02-12 DIAGNOSIS — Z87891 Personal history of nicotine dependence: Secondary | ICD-10-CM | POA: Insufficient documentation

## 2016-02-12 DIAGNOSIS — R079 Chest pain, unspecified: Secondary | ICD-10-CM | POA: Diagnosis not present

## 2016-02-12 DIAGNOSIS — Z7982 Long term (current) use of aspirin: Secondary | ICD-10-CM | POA: Insufficient documentation

## 2016-02-12 DIAGNOSIS — Z794 Long term (current) use of insulin: Secondary | ICD-10-CM | POA: Diagnosis not present

## 2016-02-12 DIAGNOSIS — I509 Heart failure, unspecified: Secondary | ICD-10-CM | POA: Diagnosis not present

## 2016-02-12 DIAGNOSIS — R52 Pain, unspecified: Secondary | ICD-10-CM | POA: Diagnosis present

## 2016-02-12 LAB — CBC WITH DIFFERENTIAL/PLATELET
Basophils Absolute: 0 10*3/uL (ref 0.0–0.1)
Basophils Relative: 0 %
Eosinophils Absolute: 0 10*3/uL (ref 0.0–0.7)
Eosinophils Relative: 1 %
HCT: 32.5 % — ABNORMAL LOW (ref 39.0–52.0)
Hemoglobin: 10.8 g/dL — ABNORMAL LOW (ref 13.0–17.0)
Lymphocytes Relative: 11 %
Lymphs Abs: 0.6 10*3/uL — ABNORMAL LOW (ref 0.7–4.0)
MCH: 32.2 pg (ref 26.0–34.0)
MCHC: 33.2 g/dL (ref 30.0–36.0)
MCV: 97 fL (ref 78.0–100.0)
Monocytes Absolute: 0.6 10*3/uL (ref 0.1–1.0)
Monocytes Relative: 12 %
Neutro Abs: 3.8 10*3/uL (ref 1.7–7.7)
Neutrophils Relative %: 76 %
Platelets: 149 10*3/uL — ABNORMAL LOW (ref 150–400)
RBC: 3.35 MIL/uL — ABNORMAL LOW (ref 4.22–5.81)
RDW: 13.7 % (ref 11.5–15.5)
WBC: 5 10*3/uL (ref 4.0–10.5)

## 2016-02-12 NOTE — ED Triage Notes (Addendum)
Pt c/o fever, body aches, cough congestion and generalized weakness x 2 days PYA tylenol 1 grm

## 2016-02-12 NOTE — ED Notes (Signed)
Attempted IV stick, obtained blood but IV would not thread

## 2016-02-12 NOTE — ED Provider Notes (Addendum)
[place] DEPT [facility] Provider Note   CSN: [id number] Arrival date & time: 02/12/16  2229  By signing my name below, I, Jaquelyn Bitter., attest that this documentation has been prepared under the direction and in the presence of Veryl Speak, MD. Electronically signed: Jaquelyn Bitter., ED Scribe. 02/12/16. 11:19 PM.    History   Chief Complaint Chief Complaint  Patient presents with  . Generalized Body Aches    HPI  HPI Comments: Johnny Ochoa is a 70 y.o. male with h/o pacemaker, stints, COPD, diabetes mellitus, HTN and hyperlipidemia who presents to the Emergency Department complaining of generalized body aches with sudden onset x1 day. Pt states that he has had body aches with associated fatigue, fever, chills and appetite change for the past day. He denies any modifying factors. Pt denies cough, vomiting, diarrhea and sore throat.    The history is provided by the patient. No language interpreter was used.    Past Medical History:  Diagnosis Date  . Cancer [facility])    prostate  (Radiation only)  . CHF (congestive heart failure) ([place])   . Diabetes mellitus   . Hypertension   . Kidney stones   . Leg swelling   . Low back pain   . Myocardial infarction 12/09/1993  . Obese   . OSA (obstructive sleep apnea)   . Peripheral neuropathy ([place])   . PTSD (post-traumatic stress disorder)     Patient Active Problem List   Diagnosis Date Noted  . Chronic systolic heart failure ([place]) 06/23/2014  . Bacteremia 06/19/2014  . UTI (lower urinary tract infection) 06/19/2014  . Diabetes ([place]) 06/19/2014  . Anemia 11/30/2013    Past Surgical History:  Procedure Laterality Date  . CORONARY ANGIOPLASTY WITH STENT PLACEMENT  1995    x 3 stents  . JOINT REPLACEMENT  2004   right total knee replacement  . MULTIPLE TOOTH EXTRACTIONS     Total Tooth extraction  . PACEMAKER PLACEMENT         Home Medications    Prior to Admission medications   Medication  Sig Start Date End Date Taking? Authorizing Provider  acetaminophen (TYLENOL) 500 MG chewable tablet Chew 500 mg by mouth every 6 (six) hours as needed for pain.   Yes Historical Provider, MD  albuterol (PROVENTIL HFA;VENTOLIN HFA) 108 (90 BASE) MCG/ACT inhaler Inhale 2 puffs into the lungs every 4 (four) hours as needed for wheezing or shortness of breath. 08/24/13   Orpah Greek, MD  aspirin EC 81 MG tablet Take 81 mg by mouth daily.    Historical Provider, MD  atorvastatin (LIPITOR) 80 MG tablet Take 80 mg by mouth daily.    Historical Provider, MD  cephALEXin (KEFLEX) 500 MG capsule Take 1 capsule (500 mg total) by mouth 3 (three) times daily. 06/23/14   Costin Karlyne Greenspan, MD  docusate sodium (COLACE) 100 MG capsule Take 100 mg by mouth 2 (two) times daily.    Historical Provider, MD  DULoxetine (CYMBALTA) 30 MG capsule Take 30 mg by mouth daily.    Historical Provider, MD  furosemide (LASIX) 20 MG tablet Take 20-40 mg by mouth 2 (two) times daily. Take 2 tablets in the morning as needed and take 1 tablet before lunch daily for blood pressure control and fluid    Historical Provider, MD  furosemide (LASIX) 40 MG tablet Take 40 mg by mouth daily.    Historical Provider, MD  gabapentin (NEURONTIN) 400 MG capsule Take 400 mg by  mouth 4 (four) times daily.    Historical Provider, MD  HYDROcodone-acetaminophen (NORCO/VICODIN) 5-325 MG per tablet Take 2 tablets by mouth every 4 (four) hours as needed. 09/26/13   Fransico Meadow, PA-C  insulin glargine (LANTUS) 100 UNIT/ML injection Inject 24 Units into the skin at bedtime.    Historical Provider, MD  insulin NPH-regular Human (NOVOLIN 70/30) (70-30) 100 UNIT/ML injection Inject 28 Units into the skin 3 (three) times daily.    Historical Provider, MD  lisinopril (PRINIVIL,ZESTRIL) 20 MG tablet Take 10 mg by mouth 2 (two) times daily.    Historical Provider, MD  Menthol-Methyl Salicylate [id number] % CREA Apply 1 application topically 4 (four) times daily as  needed (to knees for pain).     Historical Provider, MD  methadone (DOLOPHINE) 10 MG tablet Take 10 mg by mouth every 12 (twelve) hours.    Historical Provider, MD  metoprolol (LOPRESSOR) 50 MG tablet Take 25 mg by mouth 2 (two) times daily.    Historical Provider, MD  Multiple Vitamin (MULTIVITAMIN) tablet Take 1 tablet by mouth daily.      Historical Provider, MD  nitroGLYCERIN (NITRODUR - DOSED IN MG/24 HR) 0.2 mg/hr Place 1 patch onto the skin daily. Use in conjunction with 0.6 mg/hr patch     Historical Provider, MD  nitroGLYCERIN (NITRODUR - DOSED IN MG/24 HR) 0.6 mg/hr Place 1 patch onto the skin daily.      Historical Provider, MD  nitroGLYCERIN (NITROSTAT) 0.4 MG SL tablet Place 0.4 mg under the tongue every 5 (five) minutes as needed.      Historical Provider, MD  omeprazole (PRILOSEC) 20 MG capsule Take 40 mg by mouth 2 (two) times daily before a meal.    Historical Provider, MD  OXYCODONE HCL PO Take 5 mg by mouth 2 (two) times daily as needed.     Historical Provider, MD  polyethylene glycol (MIRALAX / GLYCOLAX) packet Take 17 g by mouth at bedtime.    Historical Provider, MD  tamsulosin (FLOMAX) 0.4 MG CAPS capsule Take 0.4 mg by mouth at bedtime.    Historical Provider, MD  TGT PSYLLIUM FIBER PO Take 5-15 mLs by mouth at bedtime. Take 2 teaspoon daily and slowly increase to 1 tablespoon daily at bedtime    Historical Provider, MD  tiZANidine (ZANAFLEX) 2 MG tablet Take 2 mg by mouth 2 (two) times daily.    Historical Provider, MD    Family History Family History  Problem Relation Age of Onset  . Heart attack Mother   . Diabetes Other   . Hypertension Other   . Heart disease Other   . Arthritis Other     Social History Social History  Substance Use Topics  . Smoking status: Former Smoker    Packs/day: 1.00    Years: 20.00    Types: Cigarettes    Quit date: 10/23/1980  . Smokeless tobacco: Not on file  . Alcohol use No     Allergies   Patient has no known  allergies.   Review of Systems Review of Systems  Constitutional: Positive for appetite change, chills, fatigue and fever.  HENT: Negative for ear pain and sore throat.   Eyes: Negative for pain and visual disturbance.  Respiratory: Negative for cough and shortness of breath.   Cardiovascular: Negative for chest pain and palpitations.  Gastrointestinal: Positive for abdominal pain. Negative for diarrhea, nausea and vomiting.  Genitourinary: Positive for frequency. Negative for dysuria and hematuria.  Musculoskeletal: Positive for myalgias. Negative for  arthralgias and back pain.  Skin: Negative for color change and rash.  Neurological: Negative for seizures and syncope.  All other systems reviewed and are negative.    Physical Exam Updated Vital Signs BP (!) 144/41   Pulse 68   Temp 98.9 F (37.2 C) (Oral)   Resp 18   Ht 5\' 8"  (1.727 m)   Wt 272 lb (123.4 kg)   SpO2 97%   BMI 41.36 kg/m   Physical Exam  Constitutional: He appears well-developed and well-nourished.  HENT:  Head: Normocephalic and atraumatic.  Mouth/Throat: Oropharynx is clear and moist. No oropharyngeal exudate.  Eyes: Conjunctivae are normal.  Neck: Normal range of motion. Neck supple.  Cardiovascular: Normal rate, regular rhythm and normal heart sounds.   No murmur heard. Pulmonary/Chest: Effort normal and breath sounds normal. No respiratory distress. He has no wheezes. He has no rales.  Abdominal: Soft. He exhibits no distension. There is tenderness. There is no rebound and no guarding.  Mild generalized abdominal tenderness  Musculoskeletal: He exhibits edema.  1-2+ pitting edema to both lower extremities  Neurological: He is alert.  Skin: Skin is warm and dry.  Psychiatric: He has a normal mood and affect.  Nursing note and vitals reviewed.    ED Treatments / Results   DIAGNOSTIC STUDIES: Oxygen Saturation is 97% on RA, adequate by my interpretation.   COORDINATION OF CARE: 11:37  PM-Discussed next steps with pt. Pt verbalized understanding and is agreeable with the plan.    Labs (all labs ordered are listed, but only abnormal results are displayed) Labs Reviewed - No data to display  EKG  EKG Interpretation  Date/Time:  Wednesday February 12 2016 23:31:20 EST Ventricular Rate:  60 PR Interval:    QRS Duration: 177 QT Interval:  511 QTC Calculation: 511 R Axis:   -55 Text Interpretation:  Atrial-ventricular dual-paced complexes No further analysis attempted due to paced rhythm Confirmed by Denese Mentink  MD, Amberia Bayless ([id number]) on 02/12/2016 11:35:31 PM       Radiology No results found.  Procedures Procedures (including critical care time)  Medications Ordered in ED Medications - No data to display   Initial Impression / Assessment and Plan / ED Course  I have reviewed the triage vital signs and the nursing notes.  Pertinent labs & imaging results that were available during my care of the patient were reviewed by me and considered in my medical decision making (see chart for details).  Clinical Course     Patient with history of stents, pacemaker, diabetes presents for evaluation of fever and chills and generalized aches. He appears hemodynamically stable and is afebrile on presentation. His workup reveals no elevation of white count or other concerning laboratory abnormality. His urinalysis is clear. The chest x-ray does possibly suggest a developing infiltrate. This will be treated with Doxycycline. He is to also take Tylenol or ibuprofen as needed if his fever returns. To return as needed if symptoms significantly worsen or change.  Final Clinical Impressions(s) / ED Diagnoses   Final diagnoses:  None    New Prescriptions New Prescriptions   No medications on file   I personally performed the services described in this documentation, which was scribed in my presence. The recorded information has been reviewed and is accurate.       Veryl Speak,  MD 02/13/16 [id number]    Veryl Speak, MD 02/13/16 [phone]

## 2016-02-12 NOTE — ED Notes (Signed)
Patient states he has chronic pain, states that his pain has started to worsen. Patient states he was cold yesterday, family reports fevers up to 101.8 at home, treated with tylenol. Family reports temp at home was 101.8 tonight, and patient was given 1 gram tylenol, afebrile on arrival to ER. Patient denies cough, although family states the patient had a nonproductive cough yesterday. Patient denies any urinary symptoms, although family at bedside states the patient has had issues with UTI in the past. Patient denies difficulty, discomfort, unusual odor or color to urine. Patient A&Ox4. Family at bedside.

## 2016-02-12 NOTE — ED Notes (Signed)
Patient transported to X-ray 

## 2016-02-13 DIAGNOSIS — R079 Chest pain, unspecified: Secondary | ICD-10-CM | POA: Diagnosis not present

## 2016-02-13 LAB — URINALYSIS, MICROSCOPIC (REFLEX)

## 2016-02-13 LAB — COMPREHENSIVE METABOLIC PANEL
ALT: 13 U/L — ABNORMAL LOW (ref 17–63)
AST: 21 U/L (ref 15–41)
Albumin: 3.3 g/dL — ABNORMAL LOW (ref 3.5–5.0)
Alkaline Phosphatase: 44 U/L (ref 38–126)
Anion gap: 7 (ref 5–15)
BUN: 11 mg/dL (ref 6–20)
CO2: 28 mmol/L (ref 22–32)
Calcium: 8.4 mg/dL — ABNORMAL LOW (ref 8.9–10.3)
Chloride: 104 mmol/L (ref 101–111)
Creatinine, Ser: 1.15 mg/dL (ref 0.61–1.24)
GFR calc Af Amer: >60 mL/min (ref >60)
GFR calc non Af Amer: >60 mL/min (ref >60)
Glucose, Bld: 107 mg/dL — ABNORMAL HIGH (ref 65–99)
Potassium: 3.3 mmol/L — ABNORMAL LOW (ref 3.5–5.1)
Sodium: 139 mmol/L (ref 135–145)
Total Bilirubin: 1 mg/dL (ref 0.3–1.2)
Total Protein: 6.7 g/dL (ref 6.5–8.1)

## 2016-02-13 LAB — URINALYSIS, ROUTINE W REFLEX MICROSCOPIC
Glucose, UA: NEGATIVE mg/dL
Hgb urine dipstick: NEGATIVE
Ketones, ur: NEGATIVE mg/dL
Nitrite: NEGATIVE
Protein, ur: NEGATIVE mg/dL
Specific Gravity, Urine: 1.017 (ref 1.005–1.030)
pH: 6 (ref 5.0–8.0)

## 2016-02-13 MED ORDER — DOXYCYCLINE HYCLATE 100 MG PO CAPS: 100.0000 mg | ORAL_CAPSULE | Freq: Two times a day (BID) | ORAL | 0 refills | Status: DC

## 2016-02-13 NOTE — Discharge Instructions (Signed)
Doxycycline as prescribed.  Tylenol 1000 mg rotated with ibuprofen 600 mg every 4 hours as needed for pain or fever.  Return to the emergency department if you develop severe chest pain, difficulty breathing, or other new and concerning symptoms.

## 2016-02-13 NOTE — ED Notes (Signed)
Pt verbalizes understanding of d/c instructions and denies any further needs at this time. 

## 2016-02-13 NOTE — ED Notes (Signed)
Pt returned from xray

## 2016-02-25 ENCOUNTER — Encounter (HOSPITAL_BASED_OUTPATIENT_CLINIC_OR_DEPARTMENT_OTHER): Payer: Self-pay

## 2016-02-25 ENCOUNTER — Emergency Department (HOSPITAL_BASED_OUTPATIENT_CLINIC_OR_DEPARTMENT_OTHER): Payer: Medicare Other

## 2016-02-25 ENCOUNTER — Inpatient Hospital Stay (HOSPITAL_BASED_OUTPATIENT_CLINIC_OR_DEPARTMENT_OTHER)
Admission: EM | Admit: 2016-02-25 | Discharge: 2016-02-28 | DRG: 291 | Disposition: A | Discharge status: Home Health | Payer: Medicare Other | Attending: Internal Medicine | Admitting: Internal Medicine

## 2016-02-25 DIAGNOSIS — I5023 Acute on chronic systolic (congestive) heart failure: Secondary | ICD-10-CM | POA: Diagnosis present

## 2016-02-25 DIAGNOSIS — J9601 Acute respiratory failure with hypoxia: Secondary | ICD-10-CM | POA: Diagnosis present

## 2016-02-25 DIAGNOSIS — Z794 Long term (current) use of insulin: Secondary | ICD-10-CM | POA: Diagnosis not present

## 2016-02-25 DIAGNOSIS — F4310 Post-traumatic stress disorder, unspecified: Secondary | ICD-10-CM | POA: Diagnosis present

## 2016-02-25 DIAGNOSIS — G629 Polyneuropathy, unspecified: Secondary | ICD-10-CM | POA: Diagnosis not present

## 2016-02-25 DIAGNOSIS — R0602 Shortness of breath: Secondary | ICD-10-CM | POA: Diagnosis not present

## 2016-02-25 DIAGNOSIS — Z833 Family history of diabetes mellitus: Secondary | ICD-10-CM

## 2016-02-25 DIAGNOSIS — E1140 Type 2 diabetes mellitus with diabetic neuropathy, unspecified: Secondary | ICD-10-CM | POA: Diagnosis not present

## 2016-02-25 DIAGNOSIS — Z87891 Personal history of nicotine dependence: Secondary | ICD-10-CM | POA: Diagnosis not present

## 2016-02-25 DIAGNOSIS — IMO0002 Reserved for concepts with insufficient information to code with codable children: Secondary | ICD-10-CM

## 2016-02-25 DIAGNOSIS — Z6841 Body Mass Index (BMI) 40.0 and over, adult: Secondary | ICD-10-CM | POA: Diagnosis not present

## 2016-02-25 DIAGNOSIS — E876 Hypokalemia: Secondary | ICD-10-CM | POA: Diagnosis not present

## 2016-02-25 DIAGNOSIS — E785 Hyperlipidemia, unspecified: Secondary | ICD-10-CM | POA: Diagnosis not present

## 2016-02-25 DIAGNOSIS — I255 Ischemic cardiomyopathy: Secondary | ICD-10-CM | POA: Diagnosis present

## 2016-02-25 DIAGNOSIS — Z950 Presence of cardiac pacemaker: Secondary | ICD-10-CM

## 2016-02-25 DIAGNOSIS — E1165 Type 2 diabetes mellitus with hyperglycemia: Secondary | ICD-10-CM | POA: Diagnosis not present

## 2016-02-25 DIAGNOSIS — Z955 Presence of coronary angioplasty implant and graft: Secondary | ICD-10-CM | POA: Diagnosis not present

## 2016-02-25 DIAGNOSIS — I10 Essential (primary) hypertension: Secondary | ICD-10-CM | POA: Diagnosis present

## 2016-02-25 DIAGNOSIS — I2510 Atherosclerotic heart disease of native coronary artery without angina pectoris: Secondary | ICD-10-CM | POA: Diagnosis present

## 2016-02-25 DIAGNOSIS — I5022 Chronic systolic (congestive) heart failure: Secondary | ICD-10-CM

## 2016-02-25 DIAGNOSIS — R7989 Other specified abnormal findings of blood chemistry: Secondary | ICD-10-CM

## 2016-02-25 DIAGNOSIS — J189 Pneumonia, unspecified organism: Secondary | ICD-10-CM | POA: Diagnosis not present

## 2016-02-25 DIAGNOSIS — I252 Old myocardial infarction: Secondary | ICD-10-CM

## 2016-02-25 DIAGNOSIS — R778 Other specified abnormalities of plasma proteins: Secondary | ICD-10-CM | POA: Diagnosis present

## 2016-02-25 DIAGNOSIS — Z8249 Family history of ischemic heart disease and other diseases of the circulatory system: Secondary | ICD-10-CM

## 2016-02-25 DIAGNOSIS — F329 Major depressive disorder, single episode, unspecified: Secondary | ICD-10-CM | POA: Diagnosis present

## 2016-02-25 DIAGNOSIS — E662 Morbid (severe) obesity with alveolar hypoventilation: Secondary | ICD-10-CM | POA: Diagnosis present

## 2016-02-25 DIAGNOSIS — I248 Other forms of acute ischemic heart disease: Secondary | ICD-10-CM | POA: Diagnosis present

## 2016-02-25 DIAGNOSIS — F418 Other specified anxiety disorders: Secondary | ICD-10-CM | POA: Diagnosis present

## 2016-02-25 DIAGNOSIS — I110 Hypertensive heart disease with heart failure: Principal | ICD-10-CM | POA: Diagnosis present

## 2016-02-25 DIAGNOSIS — R079 Chest pain, unspecified: Secondary | ICD-10-CM | POA: Diagnosis not present

## 2016-02-25 DIAGNOSIS — E118 Type 2 diabetes mellitus with unspecified complications: Secondary | ICD-10-CM

## 2016-02-25 DIAGNOSIS — Z7982 Long term (current) use of aspirin: Secondary | ICD-10-CM | POA: Diagnosis not present

## 2016-02-25 DIAGNOSIS — R05 Cough: Secondary | ICD-10-CM | POA: Diagnosis present

## 2016-02-25 DIAGNOSIS — I509 Heart failure, unspecified: Secondary | ICD-10-CM

## 2016-02-25 LAB — BASIC METABOLIC PANEL
Anion gap: 9 (ref 5–15)
BUN: 10 mg/dL (ref 6–20)
CO2: 26 mmol/L (ref 22–32)
Calcium: 8.7 mg/dL — ABNORMAL LOW (ref 8.9–10.3)
Chloride: 105 mmol/L (ref 101–111)
Creatinine, Ser: 1.02 mg/dL (ref 0.61–1.24)
GFR calc Af Amer: >60 mL/min (ref >60)
GFR calc non Af Amer: >60 mL/min (ref >60)
Glucose, Bld: 125 mg/dL — ABNORMAL HIGH (ref 65–99)
Potassium: 3.4 mmol/L — ABNORMAL LOW (ref 3.5–5.1)
Sodium: 140 mmol/L (ref 135–145)

## 2016-02-25 LAB — CBC WITH DIFFERENTIAL/PLATELET
Basophils Absolute: 0 10*3/uL (ref 0.0–0.1)
Basophils Relative: 0 %
Eosinophils Absolute: 0.1 10*3/uL (ref 0.0–0.7)
Eosinophils Relative: 1 %
HCT: 33.2 % — ABNORMAL LOW (ref 39.0–52.0)
Hemoglobin: 10.9 g/dL — ABNORMAL LOW (ref 13.0–17.0)
Lymphocytes Relative: 15 %
Lymphs Abs: 1.2 10*3/uL (ref 0.7–4.0)
MCH: 31.9 pg (ref 26.0–34.0)
MCHC: 32.8 g/dL (ref 30.0–36.0)
MCV: 97.1 fL (ref 78.0–100.0)
Monocytes Absolute: 0.6 10*3/uL (ref 0.1–1.0)
Monocytes Relative: 8 %
Neutro Abs: 5.9 10*3/uL (ref 1.7–7.7)
Neutrophils Relative %: 76 %
Platelets: 206 10*3/uL (ref 150–400)
RBC: 3.42 MIL/uL — ABNORMAL LOW (ref 4.22–5.81)
RDW: 14.1 % (ref 11.5–15.5)
WBC: 7.8 10*3/uL (ref 4.0–10.5)

## 2016-02-25 LAB — GLUCOSE, CAPILLARY: Glucose-Capillary: 117 mg/dL — ABNORMAL HIGH (ref 65–99)

## 2016-02-25 LAB — TROPONIN I: Troponin I: 0.03 ng/mL (ref <0.03)

## 2016-02-25 LAB — BRAIN NATRIURETIC PEPTIDE: B Natriuretic Peptide: 193.6 pg/mL — ABNORMAL HIGH (ref 0.0–100.0)

## 2016-02-25 MED ORDER — INSULIN GLARGINE 100 UNIT/ML ~~LOC~~ SOLN
40.0000 [IU] | Freq: Every day | SUBCUTANEOUS | Status: DC
  Administered 2016-02-25 – 2016-02-27 (×3): 40 [IU] via SUBCUTANEOUS
  Filled 2016-02-25 (×3): qty 0.4

## 2016-02-25 MED ORDER — AZITHROMYCIN 500 MG IV SOLR
INTRAVENOUS | Status: AC
  Filled 2016-02-25: qty 500

## 2016-02-25 MED ORDER — TAMSULOSIN HCL 0.4 MG PO CAPS
0.4000 mg | ORAL_CAPSULE | Freq: Every day | ORAL | Status: DC
Start: 2016-02-25 — End: 2016-02-28
  Administered 2016-02-25 – 2016-02-27 (×3): 0.4 mg via ORAL
  Filled 2016-02-25 (×3): qty 1

## 2016-02-25 MED ORDER — OXYCODONE HCL ER 20 MG PO T12A
20.0000 mg | EXTENDED_RELEASE_TABLET | Freq: Two times a day (BID) | ORAL | Status: DC
  Administered 2016-02-25 – 2016-02-28 (×6): 20 mg via ORAL
  Filled 2016-02-25 (×6): qty 2

## 2016-02-25 MED ORDER — PANTOPRAZOLE SODIUM 40 MG PO TBEC
40.0000 mg | DELAYED_RELEASE_TABLET | Freq: Every day | ORAL | Status: DC
  Administered 2016-02-26 – 2016-02-28 (×3): 40 mg via ORAL
  Filled 2016-02-25 (×3): qty 1

## 2016-02-25 MED ORDER — FUROSEMIDE 10 MG/ML IJ SOLN
40.0000 mg | Freq: Once | INTRAMUSCULAR | Status: AC
  Administered 2016-02-25: 40 mg via INTRAVENOUS
  Filled 2016-02-25: qty 4

## 2016-02-25 MED ORDER — ADULT MULTIVITAMIN W/MINERALS CH
1.0000 | ORAL_TABLET | Freq: Every day | ORAL | Status: DC
  Administered 2016-02-26 – 2016-02-28 (×3): 1 via ORAL
  Filled 2016-02-25 (×3): qty 1

## 2016-02-25 MED ORDER — POLYETHYLENE GLYCOL 3350 17 G PO PACK
17.0000 g | PACK | Freq: Every day | ORAL | Status: DC
  Filled 2016-02-25 (×2): qty 1

## 2016-02-25 MED ORDER — AZITHROMYCIN 500 MG PO TABS
500.0000 mg | ORAL_TABLET | ORAL | Status: DC
  Administered 2016-02-26: 500 mg via ORAL
  Filled 2016-02-25: qty 1

## 2016-02-25 MED ORDER — GABAPENTIN 400 MG PO CAPS
400.0000 mg | ORAL_CAPSULE | Freq: Four times a day (QID) | ORAL | Status: DC
  Administered 2016-02-25 – 2016-02-28 (×10): 400 mg via ORAL
  Filled 2016-02-25 (×10): qty 1

## 2016-02-25 MED ORDER — ASPIRIN EC 81 MG PO TBEC
81.0000 mg | DELAYED_RELEASE_TABLET | Freq: Every day | ORAL | Status: DC
  Administered 2016-02-26 – 2016-02-28 (×3): 81 mg via ORAL
  Filled 2016-02-25 (×3): qty 1

## 2016-02-25 MED ORDER — ALBUTEROL SULFATE (2.5 MG/3ML) 0.083% IN NEBU: 3.0000 mL | INHALATION_SOLUTION | RESPIRATORY_TRACT | Status: DC | PRN

## 2016-02-25 MED ORDER — FUROSEMIDE 40 MG PO TABS
40.0000 mg | ORAL_TABLET | Freq: Every day | ORAL | Status: DC
  Administered 2016-02-26 – 2016-02-27 (×2): 40 mg via ORAL
  Filled 2016-02-25 (×2): qty 1

## 2016-02-25 MED ORDER — DEXTROSE 5 % IV SOLN
1.0000 g | Freq: Once | INTRAVENOUS | Status: AC
  Administered 2016-02-25: 1 g via INTRAVENOUS
  Filled 2016-02-25: qty 10

## 2016-02-25 MED ORDER — IPRATROPIUM-ALBUTEROL 0.5-2.5 (3) MG/3ML IN SOLN
RESPIRATORY_TRACT | Status: AC
  Administered 2016-02-25: 3 mL via RESPIRATORY_TRACT
  Filled 2016-02-25: qty 9

## 2016-02-25 MED ORDER — IPRATROPIUM-ALBUTEROL 0.5-2.5 (3) MG/3ML IN SOLN
3.0000 mL | Freq: Four times a day (QID) | RESPIRATORY_TRACT | Status: DC
  Administered 2016-02-25 – 2016-02-27 (×7): 3 mL via RESPIRATORY_TRACT
  Filled 2016-02-25 (×6): qty 3

## 2016-02-25 MED ORDER — DULOXETINE HCL 30 MG PO CPEP
30.0000 mg | ORAL_CAPSULE | Freq: Every day | ORAL | Status: DC
  Administered 2016-02-26 – 2016-02-28 (×3): 30 mg via ORAL
  Filled 2016-02-25 (×3): qty 1

## 2016-02-25 MED ORDER — ATORVASTATIN CALCIUM 40 MG PO TABS
40.0000 mg | ORAL_TABLET | Freq: Every day | ORAL | Status: DC
  Administered 2016-02-26 – 2016-02-27 (×2): 40 mg via ORAL
  Filled 2016-02-25 (×2): qty 1

## 2016-02-25 MED ORDER — RANOLAZINE ER 500 MG PO TB12
1000.0000 mg | ORAL_TABLET | Freq: Two times a day (BID) | ORAL | Status: DC
  Administered 2016-02-25 – 2016-02-28 (×6): 1000 mg via ORAL
  Filled 2016-02-25 (×6): qty 2

## 2016-02-25 MED ORDER — ENOXAPARIN SODIUM 40 MG/0.4ML ~~LOC~~ SOLN
40.0000 mg | Freq: Every day | SUBCUTANEOUS | Status: DC
  Administered 2016-02-25 – 2016-02-27 (×3): 40 mg via SUBCUTANEOUS
  Filled 2016-02-25 (×3): qty 0.4

## 2016-02-25 MED ORDER — ACETAMINOPHEN 500 MG PO TABS
500.0000 mg | ORAL_TABLET | Freq: Four times a day (QID) | ORAL | Status: DC | PRN
  Administered 2016-02-26 – 2016-02-27 (×2): 500 mg via ORAL
  Filled 2016-02-25 (×2): qty 1

## 2016-02-25 MED ORDER — POTASSIUM CHLORIDE CRYS ER 20 MEQ PO TBCR
20.0000 meq | EXTENDED_RELEASE_TABLET | Freq: Once | ORAL | Status: AC
  Administered 2016-02-25: 20 meq via ORAL
  Filled 2016-02-25: qty 1

## 2016-02-25 MED ORDER — DOCUSATE SODIUM 100 MG PO CAPS
100.0000 mg | ORAL_CAPSULE | Freq: Two times a day (BID) | ORAL | Status: DC
  Administered 2016-02-25 – 2016-02-28 (×6): 100 mg via ORAL
  Filled 2016-02-25 (×6): qty 1

## 2016-02-25 MED ORDER — INSULIN ASPART 100 UNIT/ML ~~LOC~~ SOLN
6.0000 [IU] | Freq: Three times a day (TID) | SUBCUTANEOUS | Status: DC
  Administered 2016-02-26 – 2016-02-28 (×8): 6 [IU] via SUBCUTANEOUS

## 2016-02-25 MED ORDER — METOPROLOL TARTRATE 25 MG PO TABS
25.0000 mg | ORAL_TABLET | Freq: Two times a day (BID) | ORAL | Status: DC
  Administered 2016-02-25 – 2016-02-28 (×6): 25 mg via ORAL
  Filled 2016-02-25 (×6): qty 1

## 2016-02-25 MED ORDER — DEXTROSE 5 % IV SOLN
1.0000 g | INTRAVENOUS | Status: DC
  Administered 2016-02-26: 1 g via INTRAVENOUS
  Filled 2016-02-25: qty 10

## 2016-02-25 MED ORDER — IPRATROPIUM-ALBUTEROL 0.5-2.5 (3) MG/3ML IN SOLN
3.0000 mL | RESPIRATORY_TRACT | Status: AC
  Administered 2016-02-25: 3 mL via RESPIRATORY_TRACT

## 2016-02-25 MED ORDER — DEXTROSE 5 % IV SOLN
500.0000 mg | Freq: Once | INTRAVENOUS | Status: AC
  Administered 2016-02-25: 500 mg via INTRAVENOUS

## 2016-02-25 MED ORDER — INSULIN ASPART 100 UNIT/ML ~~LOC~~ SOLN
0.0000 [IU] | Freq: Three times a day (TID) | SUBCUTANEOUS | Status: DC
Start: 2016-02-26 — End: 2016-02-28
  Administered 2016-02-26: 3 [IU] via SUBCUTANEOUS
  Administered 2016-02-26: 4 [IU] via SUBCUTANEOUS
  Administered 2016-02-27 (×2): 3 [IU] via SUBCUTANEOUS
  Administered 2016-02-27: 4 [IU] via SUBCUTANEOUS
  Administered 2016-02-28 (×2): 3 [IU] via SUBCUTANEOUS

## 2016-02-25 MED ORDER — LISINOPRIL 10 MG PO TABS
10.0000 mg | ORAL_TABLET | Freq: Two times a day (BID) | ORAL | Status: DC
  Administered 2016-02-25 – 2016-02-28 (×6): 10 mg via ORAL
  Filled 2016-02-25 (×6): qty 1

## 2016-02-25 NOTE — Progress Notes (Signed)
Patient admitted through [facility] ED by [place]. On arrival patient verbal and looked very SOB when ambulated to the bed. Vital signs checked and recorded. Oriented to the room and placed call Fredrick within reach. Made him comfortable in bed and awaiting on MD's admission orders. Will keep monitoring.

## 2016-02-25 NOTE — ED Triage Notes (Addendum)
Pt states was sent for "fluid in lungs" from CXR at [facility] today-was treated for PNE in Dec-states he has not felt well since prior to that-NAD-taken to tx area in w/c

## 2016-02-25 NOTE — ED Notes (Signed)
Went to walk pt with pulse ox. Pt's spo2 was 88 sat pt up and pt began to  wheeze more. Reported to MD and RN.

## 2016-02-25 NOTE — ED Notes (Signed)
Attempted report to floor.  

## 2016-02-25 NOTE — Evaluation (Signed)
70 ? sob and cough x 1/52--CXR labs done there.  CXr in ED pulm edema and PNA-NO HCAP--started azith and zosyn.--hypoxic on ambulation in 80's.  Troponin + but ekg normal. No cp, swelling on exam really no rales no rhonchi----FLu test.  Accepted to tele  Verneita Griffes, MD [facility] [phone]

## 2016-02-25 NOTE — ED Provider Notes (Signed)
[facility] DEPT [facility] Provider Note   CSN: [id number] Arrival date & time: 02/25/16  1536     History   Chief Complaint Chief Complaint  Patient presents with  . Cough    HPI Johnny Ochoa is a 71 y.o. male.  71 yo M with a chief complaint of cough and shortness of breath. This been going on for about a week. Patient has had issues for the past year. Denies any fevers. Saw his family physician today at the [place] who called him back and said that he had fluid on his lungs and suggested he come to the emergency department for further evaluation. Patient having some shortness of breath on exertion. Denies chest pain. Feels that his legs are slightly more swollen than normal.   The history is provided by the patient.  Cough  This is a new problem. The current episode started more than 1 week ago. The problem occurs constantly. The problem has not changed since onset.The cough is non-productive. There has been no fever. Associated symptoms include shortness of breath. Pertinent negatives include no chest pain, no chills, no headaches and no myalgias. He is not a smoker.    Past Medical History:  Diagnosis Date  . Cancer [facility])    prostate  (Radiation only)  . CHF (congestive heart failure) ([place])   . Diabetes mellitus   . Hypertension   . Kidney stones   . Leg swelling   . Low back pain   . Myocardial infarction 12/09/1993  . Obese   . OSA (obstructive sleep apnea)   . Peripheral neuropathy ([place])   . PTSD (post-traumatic stress disorder)     Patient Active Problem List   Diagnosis Date Noted  . CAP (community acquired pneumonia) 02/25/2016  . Chronic systolic heart failure ([place]) 06/23/2014  . Bacteremia 06/19/2014  . UTI (lower urinary tract infection) 06/19/2014  . Diabetes ([place]) 06/19/2014  . Anemia 11/30/2013    Past Surgical History:  Procedure Laterality Date  . CARDIAC DEFIBRILLATOR PLACEMENT    . CORONARY ANGIOPLASTY WITH STENT PLACEMENT  1995    x 3 stents  .  JOINT REPLACEMENT  2004   right total knee replacement  . MULTIPLE TOOTH EXTRACTIONS     Total Tooth extraction  . PACEMAKER INSERTION    . PACEMAKER PLACEMENT         Home Medications    Prior to Admission medications   Medication Sig Start Date End Date Taking? Authorizing Provider  acetaminophen (TYLENOL) 500 MG chewable tablet Chew 500 mg by mouth every 6 (six) hours as needed for pain.    Historical Provider, MD  albuterol (PROVENTIL HFA;VENTOLIN HFA) 108 (90 BASE) MCG/ACT inhaler Inhale 2 puffs into the lungs every 4 (four) hours as needed for wheezing or shortness of breath. 08/24/13   Orpah Greek, MD  aspirin EC 81 MG tablet Take 81 mg by mouth daily.    Historical Provider, MD  atorvastatin (LIPITOR) 80 MG tablet Take 80 mg by mouth daily.    Historical Provider, MD  cephALEXin (KEFLEX) 500 MG capsule Take 1 capsule (500 mg total) by mouth 3 (three) times daily. 06/23/14   Costin Karlyne Greenspan, MD  docusate sodium (COLACE) 100 MG capsule Take 100 mg by mouth 2 (two) times daily.    Historical Provider, MD  doxycycline (VIBRAMYCIN) 100 MG capsule Take 1 capsule (100 mg total) by mouth 2 (two) times daily. One po bid x 7 days 02/13/16   Veryl Speak, MD  DULoxetine (CYMBALTA) 30 MG capsule Take 30 mg by mouth daily.    Historical Provider, MD  furosemide (LASIX) 20 MG tablet Take 20-40 mg by mouth 2 (two) times daily. Take 2 tablets in the morning as needed and take 1 tablet before lunch daily for blood pressure control and fluid    Historical Provider, MD  furosemide (LASIX) 40 MG tablet Take 40 mg by mouth daily.    Historical Provider, MD  gabapentin (NEURONTIN) 400 MG capsule Take 400 mg by mouth 4 (four) times daily.    Historical Provider, MD  HYDROcodone-acetaminophen (NORCO/VICODIN) 5-325 MG per tablet Take 2 tablets by mouth every 4 (four) hours as needed. 09/26/13   Fransico Meadow, PA-C  insulin glargine (LANTUS) 100 UNIT/ML injection Inject 24 Units into the skin at  bedtime.    Historical Provider, MD  insulin NPH-regular Human (NOVOLIN 70/30) (70-30) 100 UNIT/ML injection Inject 28 Units into the skin 3 (three) times daily.    Historical Provider, MD  lisinopril (PRINIVIL,ZESTRIL) 20 MG tablet Take 10 mg by mouth 2 (two) times daily.    Historical Provider, MD  Menthol-Methyl Salicylate Q000111Q % CREA Apply 1 application topically 4 (four) times daily as needed (to knees for pain).     Historical Provider, MD  methadone (DOLOPHINE) 10 MG tablet Take 10 mg by mouth every 12 (twelve) hours.    Historical Provider, MD  metoprolol (LOPRESSOR) 50 MG tablet Take 25 mg by mouth 2 (two) times daily.    Historical Provider, MD  Multiple Vitamin (MULTIVITAMIN) tablet Take 1 tablet by mouth daily.      Historical Provider, MD  nitroGLYCERIN (NITRODUR - DOSED IN MG/24 HR) 0.2 mg/hr Place 1 patch onto the skin daily. Use in conjunction with 0.6 mg/hr patch     Historical Provider, MD  nitroGLYCERIN (NITRODUR - DOSED IN MG/24 HR) 0.6 mg/hr Place 1 patch onto the skin daily.      Historical Provider, MD  nitroGLYCERIN (NITROSTAT) 0.4 MG SL tablet Place 0.4 mg under the tongue every 5 (five) minutes as needed.      Historical Provider, MD  omeprazole (PRILOSEC) 20 MG capsule Take 40 mg by mouth 2 (two) times daily before a meal.    Historical Provider, MD  OXYCODONE HCL PO Take 5 mg by mouth 2 (two) times daily as needed.     Historical Provider, MD  polyethylene glycol (MIRALAX / GLYCOLAX) packet Take 17 g by mouth at bedtime.    Historical Provider, MD  tamsulosin (FLOMAX) 0.4 MG CAPS capsule Take 0.4 mg by mouth at bedtime.    Historical Provider, MD  TGT PSYLLIUM FIBER PO Take 5-15 mLs by mouth at bedtime. Take 2 teaspoon daily and slowly increase to 1 tablespoon daily at bedtime    Historical Provider, MD  tiZANidine (ZANAFLEX) 2 MG tablet Take 2 mg by mouth 2 (two) times daily.    Historical Provider, MD    Family History Family History  Problem Relation Age of Onset    . Heart attack Mother   . Diabetes Other   . Hypertension Other   . Heart disease Other   . Arthritis Other     Social History Social History  Substance Use Topics  . Smoking status: Former Smoker    Packs/day: 1.00    Years: 20.00    Types: Cigarettes    Quit date: 10/23/1980  . Smokeless tobacco: Never Used  . Alcohol use No     Allergies   Patient  has no known allergies.   Review of Systems Review of Systems  Constitutional: Negative for chills and fever.  HENT: Positive for congestion. Negative for facial swelling.   Eyes: Negative for discharge and visual disturbance.  Respiratory: Positive for cough and shortness of breath.   Cardiovascular: Negative for chest pain and palpitations.  Gastrointestinal: Negative for abdominal pain, diarrhea and vomiting.  Musculoskeletal: Negative for arthralgias and myalgias.  Skin: Negative for color change and rash.  Neurological: Negative for tremors, syncope and headaches.  Psychiatric/Behavioral: Negative for confusion and dysphoric mood.     Physical Exam Updated Vital Signs BP 155/73   Pulse 62   Resp 14   Ht 5\' 8"  (1.727 m)   Wt 283 lb (128.4 kg)   SpO2 91%   BMI 43.03 kg/m   Physical Exam  Constitutional: He is oriented to person, place, and time. He appears well-developed and well-nourished.  HENT:  Head: Normocephalic and atraumatic.  Eyes: EOM are normal. Pupils are equal, round, and reactive to light.  Neck: Normal range of motion. Neck supple. No JVD present.  Cardiovascular: Normal rate and regular rhythm.  Exam reveals no gallop and no friction rub.   No murmur heard. Pulmonary/Chest: No respiratory distress. He has no wheezes.  Distant breath sounds  Abdominal: He exhibits no distension and no mass. There is no tenderness. There is no rebound and no guarding.  Musculoskeletal: Normal range of motion. He exhibits edema (1+).  Neurological: He is alert and oriented to person, place, and time.  Skin:  No rash noted. No pallor.  Psychiatric: He has a normal mood and affect. His behavior is normal.  Nursing note and vitals reviewed.    ED Treatments / Results  Labs (all labs ordered are listed, but only abnormal results are displayed) Labs Reviewed  CBC WITH DIFFERENTIAL/PLATELET - Abnormal; Notable for the following:       Result Value   RBC 3.42 (*)    Hemoglobin 10.9 (*)    HCT 33.2 (*)    All other components within normal limits  BASIC METABOLIC PANEL - Abnormal; Notable for the following:    Potassium 3.4 (*)    Glucose, Bld 125 (*)    Calcium 8.7 (*)    All other components within normal limits  BRAIN NATRIURETIC PEPTIDE - Abnormal; Notable for the following:    B Natriuretic Peptide 193.6 (*)    All other components within normal limits  TROPONIN I - Abnormal; Notable for the following:    Troponin I 0.03 (*)    All other components within normal limits  CULTURE, BLOOD (ROUTINE X 2)  CULTURE, BLOOD (ROUTINE X 2)  INFLUENZA PANEL BY PCR (TYPE A & B, H1N1)    EKG  EKG Interpretation  Date/Time:  Tuesday February 25 2016 17:12:42 EST Ventricular Rate:  68 PR Interval:    QRS Duration: 106 QT Interval:  412 QTC Calculation: 439 R Axis:   53 Text Interpretation:  Sinus rhythm Low voltage, precordial leads Borderline T abnormalities, diffuse leads Otherwise no significant change Confirmed by Stark Aguinaga MD, Quillian Quince [phone]) on 02/25/2016 5:19:00 PM       Radiology Dg Chest 2 View  Result Date: 02/25/2016 CLINICAL DATA:  Getting breathing treatment, RN requested radiology to come back in about 10 minutes. shob and chest pain x today, had CXR at [facility] this am and was told he had fluid on his lungs. Previous pacemaker and 3 heart stents. EXAM: CHEST - 2 VIEW COMPARISON:  02/12/2016 FINDINGS: New poorly marginated airspace disease in the posterior right upper lobe, and to a lesser degree in the superior segment left lower lobe and left lung apex. Heart size upper limits normal as  before. Left subclavian AICD stable. Mild blunting of the costophrenic angles suggesting small effusions. No pneumothorax. Visualized bones unremarkable. IMPRESSION: 1. New asymmetric airspace opacities bilaterally as above, may represent atypical edema versus pneumonia. Electronically Signed   By: Lucrezia Europe M.D.   On: 02/25/2016 16:29    Procedures Procedures (including critical care time)  Medications Ordered in ED Medications  azithromycin (ZITHROMAX) 500 mg in dextrose 5 % 250 mL IVPB (500 mg Intravenous New Bag/Given 02/25/16 1837)  ipratropium-albuterol (DUONEB) 0.5-2.5 (3) MG/3ML nebulizer solution 3 mL (3 mLs Nebulization Given 02/25/16 1722)  azithromycin (ZITHROMAX) 500 MG injection (not administered)  ipratropium-albuterol (DUONEB) 0.5-2.5 (3) MG/3ML nebulizer solution 3 mL (3 mLs Nebulization Given 02/25/16 1602)  cefTRIAXone (ROCEPHIN) 1 g in dextrose 5 % 50 mL IVPB (0 g Intravenous Stopped 02/25/16 1837)     Initial Impression / Assessment and Plan / ED Course  I have reviewed the triage vital signs and the nursing notes.  Pertinent labs & imaging results that were available during my care of the patient were reviewed by me and considered in my medical decision making (see chart for details).  Clinical Course     71 yo M With a chief complaint of cough and shortness of breath. Going on for about a week. Was seen in the [place] and told that he had fluid on his lungs. We'll obtain a repeat x-ray. Labs. They were unable to ambulate the patient has when he sat up he became very short of breath. His oxygen saturation went into the upper 80s. He became tachypnic.  Chest x-ray with pneumonia versus pulmonary edema. With patient's history of new onset cough over the past week suggest that this is more likely pneumonia. BNP also is low. Troponin is just minimally positive. Discussed with patient will admit.  The patients results and plan were reviewed and discussed.   Any x-rays performed were  independently reviewed by myself.   Differential diagnosis were considered with the presenting HPI.  Medications  azithromycin (ZITHROMAX) 500 mg in dextrose 5 % 250 mL IVPB (500 mg Intravenous New Bag/Given 02/25/16 1837)  ipratropium-albuterol (DUONEB) 0.5-2.5 (3) MG/3ML nebulizer solution 3 mL (3 mLs Nebulization Given 02/25/16 1722)  azithromycin (ZITHROMAX) 500 MG injection (not administered)  ipratropium-albuterol (DUONEB) 0.5-2.5 (3) MG/3ML nebulizer solution 3 mL (3 mLs Nebulization Given 02/25/16 1602)  cefTRIAXone (ROCEPHIN) 1 g in dextrose 5 % 50 mL IVPB (0 g Intravenous Stopped 02/25/16 1837)    Vitals:   02/25/16 1556 02/25/16 1700 02/25/16 1722 02/25/16 1737  BP:  159/69  155/73  Pulse:  61  62  Resp:    14  SpO2: 96% (!) 88% 91% 91%  Weight:      Height:        Final diagnoses:  Community acquired pneumonia, unspecified laterality    Admission/ observation were discussed with the admitting physician, patient and/or family and they are comfortable with the plan.    Final Clinical Impressions(s) / ED Diagnoses   Final diagnoses:  Community acquired pneumonia, unspecified laterality    New Prescriptions New Prescriptions   No medications on file     Deno Etienne, DO 02/25/16 1853

## 2016-02-25 NOTE — H&P (Signed)
History and Physical    Johnny Ochoa [id number] DOB: Sep 27, 1945 DOA: 02/25/2016   PCP: No PCP Per Patient Chief Complaint:  Chief Complaint  Patient presents with  . Cough    HPI: Johnny Ochoa is a 71 y.o. male with medical history significant of CHF, DM, HTN.  Patient presents to the ED at [facility] with c/o about 2 week history of cough, SOB, URI symptoms.  Symptoms started with URI and rapidly progressed to cough and SOB around 12/27 when he was seen for the first time in the ED.  Diagnosed with CAP and discharged on doxycycline.  Completed doxycyline course and symptoms have progressively worsened.  Does admit to 3 lbs weight gain over the weekend.  Does have some peripheral edema that is slightly worse than baseline.  SOB is worse with exertion or laying down flat.  ED Course: CXR is suggestive of multifocal PNA vs pulm edema.  Review of Systems: As per HPI otherwise 10 point review of systems negative.    Past Medical History:  Diagnosis Date  . Cancer [facility])    prostate  (Radiation only)  . CHF (congestive heart failure) ([place])   . Diabetes mellitus   . Hypertension   . Kidney stones   . Leg swelling   . Low back pain   . Myocardial infarction 12/09/1993  . Obese   . OSA (obstructive sleep apnea)   . Peripheral neuropathy ([place])   . PTSD (post-traumatic stress disorder)     Past Surgical History:  Procedure Laterality Date  . CARDIAC DEFIBRILLATOR PLACEMENT    . CORONARY ANGIOPLASTY WITH STENT PLACEMENT  1995    x 3 stents  . JOINT REPLACEMENT  2004   right total knee replacement  . MULTIPLE TOOTH EXTRACTIONS     Total Tooth extraction  . PACEMAKER INSERTION    . PACEMAKER PLACEMENT       reports that he quit smoking about 35 years ago. His smoking use included Cigarettes. He has a 20.00 pack-year smoking history. He has never used smokeless tobacco. He reports that he does not drink alcohol or use drugs.  No Known Allergies  Family History  Problem Relation Age  of Onset  . Heart attack Mother   . Diabetes Other   . Hypertension Other   . Heart disease Other   . Arthritis Other       Prior to Admission medications   Medication Sig Start Date End Date Taking? Authorizing Provider  acetaminophen (TYLENOL) 500 MG chewable tablet Chew 500 mg by mouth every 6 (six) hours as needed for pain.    Historical Provider, MD  albuterol (PROVENTIL HFA;VENTOLIN HFA) 108 (90 BASE) MCG/ACT inhaler Inhale 2 puffs into the lungs every 4 (four) hours as needed for wheezing or shortness of breath. 08/24/13   Orpah Greek, MD  aspirin EC 81 MG tablet Take 81 mg by mouth daily.    Historical Provider, MD  atorvastatin (LIPITOR) 80 MG tablet Take 80 mg by mouth daily.    Historical Provider, MD  docusate sodium (COLACE) 100 MG capsule Take 100 mg by mouth 2 (two) times daily.    Historical Provider, MD  DULoxetine (CYMBALTA) 30 MG capsule Take 30 mg by mouth daily.    Historical Provider, MD  furosemide (LASIX) 40 MG tablet Take 40 mg by mouth daily.    Historical Provider, MD  gabapentin (NEURONTIN) 400 MG capsule Take 400 mg by mouth 4 (four) times daily.    Historical Provider, MD  insulin glargine (LANTUS) 100 UNIT/ML injection Inject 24 Units into the skin at bedtime.    Historical Provider, MD  insulin NPH-regular Human (NOVOLIN 70/30) (70-30) 100 UNIT/ML injection Inject 28 Units into the skin 3 (three) times daily.    Historical Provider, MD  lisinopril (PRINIVIL,ZESTRIL) 20 MG tablet Take 10 mg by mouth 2 (two) times daily.    Historical Provider, MD  Menthol-Methyl Salicylate Q000111Q % CREA Apply 1 application topically 4 (four) times daily as needed (to knees for pain).     Historical Provider, MD  metoprolol (LOPRESSOR) 50 MG tablet Take 25 mg by mouth 2 (two) times daily.    Historical Provider, MD  Multiple Vitamin (MULTIVITAMIN) tablet Take 1 tablet by mouth daily.      Historical Provider, MD  omeprazole (PRILOSEC) 20 MG capsule Take 40 mg by mouth 2  (two) times daily before a meal.    Historical Provider, MD  polyethylene glycol (MIRALAX / GLYCOLAX) packet Take 17 g by mouth at bedtime.    Historical Provider, MD  tamsulosin (FLOMAX) 0.4 MG CAPS capsule Take 0.4 mg by mouth at bedtime.    Historical Provider, MD  TGT PSYLLIUM FIBER PO Take 5-15 mLs by mouth at bedtime. Take 2 teaspoon daily and slowly increase to 1 tablespoon daily at bedtime    Historical Provider, MD    Physical Exam: Vitals:   02/25/16 2000 02/25/16 2016 02/25/16 2030 02/25/16 2139  BP: 155/71  143/68 (!) 172/71  Pulse: 62  62 61  Resp: 26  24 20   Temp:    98.3 F (36.8 C)  TempSrc:    Oral  SpO2: 97% 96% 95% 97%  Weight:    124.8 kg (275 lb 1.6 oz)  Height:    5\' 8"  (1.727 m)      Constitutional: NAD, calm, comfortable Eyes: PERRL, lids and conjunctivae normal ENMT: Mucous membranes are moist. Posterior pharynx clear of any exudate or lesions.Normal dentition.  Neck: normal, supple, no masses, no thyromegaly Respiratory: Crackles throughout Cardiovascular: Regular rate and rhythm, no murmurs / rubs / gallops. No extremity edema. 2+ pedal pulses. No carotid bruits.  Abdomen: no tenderness, no masses palpated. No hepatosplenomegaly. Bowel sounds positive.  Musculoskeletal: no clubbing / cyanosis. No joint deformity upper and lower extremities. Good ROM, no contractures. Normal muscle tone.  Skin: no rashes, lesions, ulcers. No induration Neurologic: CN 2-12 grossly intact. Sensation intact, DTR normal. Strength 5/5 in all 4.  Psychiatric: Normal judgment and insight. Alert and oriented x 3. Normal mood.    Labs on Admission: I have personally reviewed following labs and imaging studies  CBC:  Recent Labs Lab 02/25/16 1600  WBC 7.8  NEUTROABS 5.9  HGB 10.9*  HCT 33.2*  MCV 97.1  PLT 99991111   Basic Metabolic Panel:  Recent Labs Lab 02/25/16 1600  NA 140  K 3.4*  CL 105  CO2 26  GLUCOSE 125*  BUN 10  CREATININE 1.02  CALCIUM 8.7*    GFR: Estimated Creatinine Clearance: 85.5 mL/min (by C-G formula based on SCr of 1.02 mg/dL). Liver Function Tests: No results for input(s): AST, ALT, ALKPHOS, BILITOT, PROT, ALBUMIN in the last 168 hours. No results for input(s): LIPASE, AMYLASE in the last 168 hours. No results for input(s): AMMONIA in the last 168 hours. Coagulation Profile: No results for input(s): INR, PROTIME in the last 168 hours. Cardiac Enzymes:  Recent Labs Lab 02/25/16 1605  TROPONINI 0.03*   BNP (last 3 results) No results for input(s):  PROBNP in the last 8760 hours. HbA1C: No results for input(s): HGBA1C in the last 72 hours. CBG:  Recent Labs Lab 02/25/16 2147  GLUCAP 117*   Lipid Profile: No results for input(s): CHOL, HDL, LDLCALC, TRIG, CHOLHDL, LDLDIRECT in the last 72 hours. Thyroid Function Tests: No results for input(s): TSH, T4TOTAL, FREET4, T3FREE, THYROIDAB in the last 72 hours. Anemia Panel: No results for input(s): VITAMINB12, FOLATE, FERRITIN, TIBC, IRON, RETICCTPCT in the last 72 hours. Urine analysis:    Component Value Date/Time   COLORURINE AMBER (A) 02/12/2016 2334   APPEARANCEUR CLEAR 02/12/2016 2334   LABSPEC 1.017 02/12/2016 2334   PHURINE 6.0 02/12/2016 2334   GLUCOSEU NEGATIVE 02/12/2016 2334   HGBUR NEGATIVE 02/12/2016 2334   BILIRUBINUR SMALL (A) 02/12/2016 2334   KETONESUR NEGATIVE 02/12/2016 2334   PROTEINUR NEGATIVE 02/12/2016 2334   UROBILINOGEN 1.0 06/19/2014 2158   NITRITE NEGATIVE 02/12/2016 2334   LEUKOCYTESUR TRACE (A) 02/12/2016 2334   Sepsis Labs: @LABRCNTIP (procalcitonin:4,lacticidven:4) )No results found for this or any previous visit (from the past 240 hour(s)).   Radiological Exams on Admission: Dg Chest 2 View  Result Date: 02/25/2016 CLINICAL DATA:  Getting breathing treatment, RN requested radiology to come back in about 10 minutes. shob and chest pain x today, had [facility] at [facility] this am and was told he had fluid on his lungs. Previous  pacemaker and 3 heart stents. EXAM: CHEST - 2 VIEW COMPARISON:  02/12/2016 FINDINGS: New poorly marginated airspace disease in the posterior right upper lobe, and to a lesser degree in the superior segment left lower lobe and left lung apex. Heart size upper limits normal as before. Left subclavian AICD stable. Mild blunting of the costophrenic angles suggesting small effusions. No pneumothorax. Visualized bones unremarkable. IMPRESSION: 1. New asymmetric airspace opacities bilaterally as above, may represent atypical edema versus pneumonia. Electronically Signed   By: Lucrezia Europe M.D.   On: 02/25/2016 16:29    EKG: Independently reviewed.  Assessment/Plan Principal Problem:   CAP (community acquired pneumonia) Active Problems:   Diabetes ([place])   Chronic systolic heart failure (H[facility])   Multifocal pneumonia   Acute respiratory failure with hypoxia ([place])    1. Acute respiratory failure with hypoxia - more likely CAP vs less likely acute CHF decompensation 1. O2 via [place] 2. Pulse ox continuous 3. Tele monitor 4. Adult wheeze protocol 2. CAP - 1. PNA pathway 2. Rocephin and azithromycin 3. Cultures pending 4. Influenza pending 5. Odd though that he doesn't have any SIRS 3. Chronic systolic CHF - 1. Continue home meds including daily PO lasix 2. Due to reported 3 lbs weight gain over weekend and remaining possibility of acute CHF, will go ahead and give him 40 IV lasix tonight. 1. Certainly his blood pressure (172/70) can tolerate this. 3. Strict intake and output 4. Repeat BMP in AM 5. Replace K PO 4. DM - 1. Continue home lantus 40 QHS 2. Holding 70/30 (takes 15+ units TID) 3. Holding other insulin that he is on 4. Will put patient on 6 units TID AC 5. And resistant scale SSI AC 6. Sounds like if anything I expect lantus dose may need to be increased ultimately. 1. Diabetes coordinator consult to try and simplify his regimen.   DVT prophylaxis: Lovenox Code Status: Full Family  Communication: Family at bedside Consults called: None Admission status: Admit to inpatient   Etta Quill DO [facility] Pager [phone] from 7PM-7AM  If 7AM-7PM, please contact the day physician for the  patient [email or web address] Password [email or web address]  02/25/2016, 11:04 PM

## 2016-02-26 DIAGNOSIS — I2510 Atherosclerotic heart disease of native coronary artery without angina pectoris: Secondary | ICD-10-CM

## 2016-02-26 DIAGNOSIS — F418 Other specified anxiety disorders: Secondary | ICD-10-CM

## 2016-02-26 DIAGNOSIS — E1142 Type 2 diabetes mellitus with diabetic polyneuropathy: Secondary | ICD-10-CM

## 2016-02-26 DIAGNOSIS — I10 Essential (primary) hypertension: Secondary | ICD-10-CM

## 2016-02-26 DIAGNOSIS — R778 Other specified abnormalities of plasma proteins: Secondary | ICD-10-CM | POA: Diagnosis present

## 2016-02-26 DIAGNOSIS — E785 Hyperlipidemia, unspecified: Secondary | ICD-10-CM

## 2016-02-26 DIAGNOSIS — I509 Heart failure, unspecified: Secondary | ICD-10-CM

## 2016-02-26 DIAGNOSIS — J9601 Acute respiratory failure with hypoxia: Secondary | ICD-10-CM

## 2016-02-26 DIAGNOSIS — R748 Abnormal levels of other serum enzymes: Secondary | ICD-10-CM

## 2016-02-26 DIAGNOSIS — E1140 Type 2 diabetes mellitus with diabetic neuropathy, unspecified: Secondary | ICD-10-CM | POA: Diagnosis present

## 2016-02-26 DIAGNOSIS — Z950 Presence of cardiac pacemaker: Secondary | ICD-10-CM

## 2016-02-26 DIAGNOSIS — J189 Pneumonia, unspecified organism: Secondary | ICD-10-CM

## 2016-02-26 DIAGNOSIS — E876 Hypokalemia: Secondary | ICD-10-CM

## 2016-02-26 DIAGNOSIS — I2583 Coronary atherosclerosis due to lipid rich plaque: Secondary | ICD-10-CM

## 2016-02-26 DIAGNOSIS — I5023 Acute on chronic systolic (congestive) heart failure: Secondary | ICD-10-CM

## 2016-02-26 DIAGNOSIS — R7989 Other specified abnormal findings of blood chemistry: Secondary | ICD-10-CM

## 2016-02-26 LAB — BASIC METABOLIC PANEL
Anion gap: 11 (ref 5–15)
BUN: 8 mg/dL (ref 6–20)
CO2: 27 mmol/L (ref 22–32)
Calcium: 8.9 mg/dL (ref 8.9–10.3)
Chloride: 103 mmol/L (ref 101–111)
Creatinine, Ser: 1.02 mg/dL (ref 0.61–1.24)
GFR calc Af Amer: >60 mL/min (ref >60)
GFR calc non Af Amer: >60 mL/min (ref >60)
Glucose, Bld: 115 mg/dL — ABNORMAL HIGH (ref 65–99)
Potassium: 3.2 mmol/L — ABNORMAL LOW (ref 3.5–5.1)
Sodium: 141 mmol/L (ref 135–145)

## 2016-02-26 LAB — GLUCOSE, CAPILLARY
Glucose-Capillary: 172 mg/dL — ABNORMAL HIGH (ref 65–99)
Glucose-Capillary: 133 mg/dL — ABNORMAL HIGH (ref 65–99)
Glucose-Capillary: 115 mg/dL — ABNORMAL HIGH (ref 65–99)
Glucose-Capillary: 171 mg/dL — ABNORMAL HIGH (ref 65–99)

## 2016-02-26 LAB — HIV ANTIBODY (ROUTINE TESTING W REFLEX): HIV Screen 4th Generation wRfx: NONREACTIVE

## 2016-02-26 LAB — INFLUENZA PANEL BY PCR (TYPE A & B)
Influenza A By PCR: NEGATIVE
Influenza B By PCR: NEGATIVE

## 2016-02-26 LAB — TROPONIN I
Troponin I: <0.03 ng/mL (ref <0.03)
Troponin I: 0.05 ng/mL (ref <0.03)
Troponin I: <0.03 ng/mL (ref <0.03)

## 2016-02-26 LAB — STREP PNEUMONIAE URINARY ANTIGEN: Strep Pneumo Urinary Antigen: NEGATIVE

## 2016-02-26 MED ORDER — POTASSIUM CHLORIDE CRYS ER 20 MEQ PO TBCR
40.0000 meq | EXTENDED_RELEASE_TABLET | Freq: Once | ORAL | Status: AC
  Administered 2016-02-26: 40 meq via ORAL
  Filled 2016-02-26: qty 2

## 2016-02-26 MED ORDER — NITROGLYCERIN 0.4 MG SL SUBL: 0.4000 mg | SUBLINGUAL_TABLET | SUBLINGUAL | Status: DC | PRN

## 2016-02-26 MED ORDER — SPIRONOLACTONE 25 MG PO TABS
25.0000 mg | ORAL_TABLET | Freq: Every day | ORAL | Status: DC
  Administered 2016-02-26 – 2016-02-28 (×3): 25 mg via ORAL
  Filled 2016-02-26 (×3): qty 1

## 2016-02-26 MED ORDER — FUROSEMIDE 10 MG/ML IJ SOLN
40.0000 mg | Freq: Two times a day (BID) | INTRAMUSCULAR | Status: DC
  Administered 2016-02-26 – 2016-02-28 (×4): 40 mg via INTRAVENOUS
  Filled 2016-02-26 (×4): qty 4

## 2016-02-26 MED ORDER — POTASSIUM CHLORIDE CRYS ER 20 MEQ PO TBCR: 20.0000 meq | EXTENDED_RELEASE_TABLET | Freq: Two times a day (BID) | ORAL | Status: DC

## 2016-02-26 MED ORDER — CLOPIDOGREL BISULFATE 75 MG PO TABS
75.0000 mg | ORAL_TABLET | Freq: Every day | ORAL | Status: DC
Start: 2016-02-26 — End: 2016-02-28
  Administered 2016-02-26 – 2016-02-28 (×3): 75 mg via ORAL
  Filled 2016-02-26 (×3): qty 1

## 2016-02-26 NOTE — Care Management Note (Signed)
Case Management Note Marvetta Gibbons RN, BSN Unit 2W-Case Manager [phone]  Patient Details  Name: Johnny Ochoa MRN: [id number] Date of Birth: 1945-11-05  Subjective/Objective:  Pt admitted with CAP                  Action/Plan: PTA pt lived at home- has a Cabin crew at [facility]. And also has a family MD at the [place]- CM to follow for d/c needs.   Expected Discharge Date:                  Expected Discharge Plan:  Home/Self Care  In-House Referral:     Discharge planning Services  CM Consult  Post Acute Care Choice:    Choice offered to:     DME Arranged:    DME Agency:     HH Arranged:    HH Agency:     Status of Service:  In process, will continue to follow  If discussed at Long Length of Stay Meetings, dates discussed:    Additional Comments:  Dawayne Patricia, RN 02/26/2016, 11:22 AM

## 2016-02-26 NOTE — Progress Notes (Signed)
Received Diabetes Coordinator consult. Spoke with patient on the phone.  Patient was diagnosed with diabetes in 1997. Is seeing Dr. Lacie Scotts with [facility] and is also seen by Dr. Zigmund Chevis at the [facility]. States that he is taking Lantus 40 units every HS, 70/30 insulin 15 units at breakfast, 15 units at lunch, and 20 units at supper, and taking Soliqua 100/33 injection: 42 units every am at home. Willeen Niece is a combination of Lantus and lixisenatide (GLP-1) that lowers A1C.  He states that he has been on [place] for about 2 months.   Concern that patient does not need to be on both Lantus and Soliqua. Patient's blood sugars seem to be doing well with Lantus and Novolog at this time while in the hospital. Will continue to monitor blood sugars while in the hospital. Harvel Ricks RN BSN CDE

## 2016-02-26 NOTE — Progress Notes (Addendum)
Progress Note    Johnny Ochoa  [id number] DOB: October 06, 1945  DOA: 02/25/2016 [facility] Primary Cardiologist: Dr. Mahala Menghini   Brief Narrative:   Chief complaint: Follow-up dyspnea  Johnny Ochoa is an 71 y.o. male who normally manages his care at [facility], but who presented to the ED 02/25/16 for evaluation of chest pain/tightness accompanied by a nonproductive cough and persistent URI symptoms despite completing a course of doxycycline as an outpatient. Of note, the patient has a history of MI and CHF, and was also noted to have weight gain. Outside records reviewed. His cardiologist is Dr. Mahala Menghini. He had an ICD replacement 10/16/15, and carries a diagnosis of chronic systolic CHF/ischemic cardiomyopathy.  He had a left heart catheterization 04/19/15 which showed an EF of 30-35 percent.  Assessment/Plan:   Principal Problem:   Acute respiratory failure with hypoxia  Although CPAP is in the differential, I suspect the patient has had a CHF exacerbation given his known EF of 30-35 percent and improvement in his symptoms after being given IV Lasix last night. Continue supplemental oxygen and referred to the plan of care as outlined below.  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 Monitor glycemic control and adjust insulin as needed. Continue Cymbalta and Neurontin for neuropathy.    Acute on Chronic systolic heart failure (HCC)/coronary artery disease/hypertension/hyperlipidemia/mild troponin elevation EF 30-35 percent by heart catheterization done 04/19/15. Symptoms improved after being given IV Lasix last night. Continue 40 mg of Lasix IV twice a day. CHF order set added. Continue lisinopril and metoprolol. Resume spironolactone. Strict I/O, daily weights requested. With  regard to underlying CAD/hyperlipidemia, continue aspirin, Plavix, Ranexa, and statin. Mild elevation of troponin noted. Cycle. Likely related to demand ischemia.    Multifocal pneumonia Suspect symptoms more likely from CHF exacerbation, but we'll continue empiric antibiotics for another 24 hours and reassess. Recheck chest x-ray in the morning after diuresis. If markedly improved in appearance, can likely discontinue antibiotics. Influenza panel was negative. Chest x-ray personally reviewed:       Anxiety/depression Continue Cymbalta.    Hypokalemia Weights on routine supplementation while on higher dose Lasix. Monitor potassium given concurrent treatment with spironolactone.    Pacemaker in situ   Family Communication/Anticipated D/C date and plan/Code Status   DVT prophylaxis: Lovenox ordered. Code Status: Full Code.  Family Communication: Wife updated at the bedside. Disposition Plan: Home in 24-48 hours depending on progress.   Medical Consultants:    None.   Procedures:    None  Anti-Infectives:    Rocephin 02/25/16--->  Azithromycin 02/25/16--->  Subjective:   The patient reports that His breathing has improved but that he continues to have a nonproductive cough and some chest tightness. No nausea or vomiting.  Objective:    Vitals:   02/25/16 2030 02/25/16 2139 02/26/16 0429 02/26/16 0818  BP: 143/68 (!) 172/71 (!) 108/54   Pulse: 62 61 63   Resp: 24 20 18    Temp:  98.3 F (36.8 C) 98.4 F (36.9 C)   TempSrc:  Oral Oral   SpO2: 95% 97% 97% 94%  Weight:  124.8 kg (275 lb 1.6 oz)    Height:  5\' 8"  (1.727 m)      Intake/Output Summary (Last 24 hours) at 02/26/16 0827 Last data filed at 02/26/16 0200  Gross per 24  hour  Intake              370 ml  Output             1200 ml  Net             -830 ml   Filed Weights   02/25/16 1551 02/25/16 2139  Weight: 128.4 kg (283 lb) 124.8 kg (275 lb 1.6 oz)    Exam: General exam: Appears calm and  comfortable. Morbidly obese. Respiratory system: Diminished throughout with large body habitus. Respiratory effort slightly increased. Cardiovascular system: S1 & S2 heard, RRR. No JVD,  rubs, gallops or clicks. No murmurs. Gastrointestinal system: Abdomen is nondistended, soft and nontender. No organomegaly or masses felt. Normal bowel sounds heard. Central nervous system: Alert and oriented. No focal neurological deficits. Extremities: No clubbing,  or cyanosis. 1+ edema. Skin: No rashes, lesions or ulcers. Psychiatry: Judgement and insight appear normal. Mood & affect appropriate.   Data Reviewed:   I have personally reviewed following labs and imaging studies:  Labs: Basic Metabolic Panel:  Recent Labs Lab 02/25/16 1600 02/26/16 0151  NA 140 141  K 3.4* 3.2*  CL 105 103  CO2 26 27  GLUCOSE 125* 115*  BUN 10 8  CREATININE 1.02 1.02  CALCIUM 8.7* 8.9   GFR Estimated Creatinine Clearance: 85.5 mL/min (by C-G formula based on SCr of 1.02 mg/dL). Liver Function Tests: No results for input(s): AST, ALT, ALKPHOS, BILITOT, PROT, ALBUMIN in the last 168 hours. No results for input(s): LIPASE, AMYLASE in the last 168 hours. No results for input(s): AMMONIA in the last 168 hours. Coagulation profile No results for input(s): INR, PROTIME in the last 168 hours.  CBC:  Recent Labs Lab 02/25/16 1600  WBC 7.8  NEUTROABS 5.9  HGB 10.9*  HCT 33.2*  MCV 97.1  PLT 206   Cardiac Enzymes:  Recent Labs Lab 02/25/16 1605  TROPONINI 0.03*   BNP (last 3 results) No results for input(s): PROBNP in the last 8760 hours. CBG:  Recent Labs Lab 02/25/16 2147 02/26/16 0604  GLUCAP 117* 172*   D-Dimer: No results for input(s): DDIMER in the last 72 hours. Hgb A1c: No results for input(s): HGBA1C in the last 72 hours. Lipid Profile: No results for input(s): CHOL, HDL, LDLCALC, TRIG, CHOLHDL, LDLDIRECT in the last 72 hours. Thyroid function studies: No results for input(s):  TSH, T4TOTAL, T3FREE, THYROIDAB in the last 72 hours.  Invalid input(s): FREET3 Anemia work up: No results for input(s): VITAMINB12, FOLATE, FERRITIN, TIBC, IRON, RETICCTPCT in the last 72 hours. Sepsis Labs:  Recent Labs Lab 02/25/16 1600  WBC 7.8    Microbiology No results found for this or any previous visit (from the past 240 hour(s)).  Radiology: Dg Chest 2 View  Result Date: 02/25/2016 CLINICAL DATA:  Getting breathing treatment, RN requested radiology to come back in about 10 minutes. shob and chest pain x today, had CXR at [facility] [place] this am and was told he had fluid on his lungs. Previous pacemaker and 3 heart stents. EXAM: CHEST - 2 VIEW COMPARISON:  02/12/2016 FINDINGS: New poorly marginated airspace disease in the posterior right upper lobe, and to a lesser degree in the superior segment left lower lobe and left lung apex. Heart size upper limits normal as before. Left subclavian AICD stable. Mild blunting of the costophrenic angles suggesting small effusions. No pneumothorax. Visualized bones unremarkable. IMPRESSION: 1. New asymmetric airspace opacities bilaterally as above, may represent atypical edema versus pneumonia.  Electronically Signed   By: Lucrezia Europe M.D.   On: 02/25/2016 16:29    Medications:   . aspirin EC  81 mg Oral Daily  . atorvastatin  40 mg Oral q1800  . azithromycin  500 mg Oral Q24H  . cefTRIAXone (ROCEPHIN)  IV  1 g Intravenous Q24H  . docusate sodium  100 mg Oral BID  . DULoxetine  30 mg Oral Daily  . enoxaparin (LOVENOX) injection  40 mg Subcutaneous QHS  . furosemide  40 mg Oral Daily  . gabapentin  400 mg Oral QID  . insulin aspart  0-20 Units Subcutaneous TID WC  . insulin aspart  6 Units Subcutaneous TID WC  . insulin glargine  40 Units Subcutaneous QHS  . ipratropium-albuterol  3 mL Nebulization QID  . lisinopril  10 mg Oral BID  . metoprolol  25 mg Oral BID  . multivitamin with minerals  1 tablet Oral Daily  . oxyCODONE  20 mg Oral Q12H  .  pantoprazole  40 mg Oral Daily  . polyethylene glycol  17 g Oral QHS  . ranolazine  1,000 mg Oral BID  . tamsulosin  0.4 mg Oral QHS   Continuous Infusions:  Medical decision making is of high complexity and this patient is at high risk of deterioration, therefore this is a level 3 visit.  (> 4 problem points, >4 data points, high risk)   Problems/DDx Points   Self limited or minor (max 2)       1   Established problem, stable       1   Established problem, worsening       2   New problem, no additional W/U planned (max 1)       3   New problem, additional W/U planned        4    Data Reviewed Points   Review/order clinical lab tests       1   Review/order x-rays       1   Review/order tests (Echo, EKG, PFTs, etc)       1   Discussion of test results w/ performing MD       1   Independent review of image, tracing or specimen       2   Decision to obtain old records       1   Review and summation of old records       2    Level of risk Presenting prob Diagnostics Management   Minimal 1 self limited/minor Labs CXR EKG/EEG U/A U/S Rest Gargles Bandages Dressings   Low 2 or more self limited/minor 1 stable chronic Acute uncomplicated illness Tests (PFTS) Non-CV imaging Arterial labs Biopsies of skin OTC drugs Minor surgery-no risk PT OT IVF without additives    Moderate 1 or more chronic illnesses w/ mild exac, progression or S/E from tx 2 or more stable chronic illnesses Undiagnosed new problem w/ uncertain prognosis Acute complicated injury  Stress tests Endoscopies with no risk factors Deep needle or incisional bx CV imaging without risk LP Thoracentesis Paracentesis Minor surgery w/ risks Elective major surgery w/ no risk (open, percutaneous or endoscopic) Prescription drugs Therapeutic nucl med IVF with additives Closed tx of fracture/dislocation    High Severe exac of chronic illness Acute or chronic illness/injury may pose a threat to life or bodily  function (ARF) Change in neuro status    CV imaging w/ contrast and risk Cardio electophysiologic tests Endoscopies w/  risk Discography Elective major surgery Emergency major surgery Parenteral controlled substances Drug therapy req monitoring for toxicity DNR/de-escalation of care    MDM Prob points Data points Risk   Straightforward    <1    <1    Min   Low complexity    2    2    Low   Moderate    3    3    Mod   High Complexity    4 or more    4 or more    High      LOS: 1 day   RAMA,CHRISTINA  [facility] Pager [phone]. If unable to reach me by pager, please call my cell phone at [phone].  *Please refer to [email or web address], password [email or web address] to get updated schedule on who will round on this patient, as hospitalists switch teams weekly. If 7PM-7AM, please contact night-coverage at [email or web address], password [email or web address] for any overnight needs.  02/26/2016, 8:27 AM

## 2016-02-27 ENCOUNTER — Inpatient Hospital Stay (HOSPITAL_COMMUNITY): Payer: Medicare Other

## 2016-02-27 DIAGNOSIS — E1165 Type 2 diabetes mellitus with hyperglycemia: Secondary | ICD-10-CM

## 2016-02-27 DIAGNOSIS — Z794 Long term (current) use of insulin: Secondary | ICD-10-CM

## 2016-02-27 DIAGNOSIS — E118 Type 2 diabetes mellitus with unspecified complications: Secondary | ICD-10-CM

## 2016-02-27 LAB — BASIC METABOLIC PANEL
Anion gap: 9 (ref 5–15)
BUN: 10 mg/dL (ref 6–20)
CO2: 28 mmol/L (ref 22–32)
Calcium: 9.1 mg/dL (ref 8.9–10.3)
Chloride: 104 mmol/L (ref 101–111)
Creatinine, Ser: 1.07 mg/dL (ref 0.61–1.24)
GFR calc Af Amer: >60 mL/min (ref >60)
GFR calc non Af Amer: >60 mL/min (ref >60)
Glucose, Bld: 155 mg/dL — ABNORMAL HIGH (ref 65–99)
Potassium: 4.3 mmol/L (ref 3.5–5.1)
Sodium: 141 mmol/L (ref 135–145)

## 2016-02-27 LAB — GLUCOSE, CAPILLARY
Glucose-Capillary: 132 mg/dL — ABNORMAL HIGH (ref 65–99)
Glucose-Capillary: 168 mg/dL — ABNORMAL HIGH (ref 65–99)
Glucose-Capillary: 144 mg/dL — ABNORMAL HIGH (ref 65–99)
Glucose-Capillary: 117 mg/dL — ABNORMAL HIGH (ref 65–99)

## 2016-02-27 MED ORDER — IPRATROPIUM-ALBUTEROL 0.5-2.5 (3) MG/3ML IN SOLN
3.0000 mL | Freq: Three times a day (TID) | RESPIRATORY_TRACT | Status: DC
  Administered 2016-02-27 (×2): 3 mL via RESPIRATORY_TRACT
  Filled 2016-02-27: qty 3

## 2016-02-27 NOTE — Progress Notes (Signed)
Progress Note    Johnny Ochoa  [id number] DOB: 24-Jan-1946  DOA: 02/25/2016 [facility] Primary Cardiologist: Dr. Mahala Menghini   Brief Narrative:   Chief complaint: Follow-up dyspnea  Johnny Ochoa is an 71 y.o. male who normally manages his care at [facility], but who presented to the ED 02/25/16 for evaluation of chest pain/tightness accompanied by a nonproductive cough and persistent URI symptoms despite completing a course of doxycycline as an outpatient. Of note, the patient has a history of MI and CHF, and was also noted to have weight gain. Outside records reviewed. His cardiologist is Dr. Mahala Menghini. He had an ICD replacement 10/16/15, and carries a diagnosis of chronic systolic CHF/ischemic cardiomyopathy.  He had a left heart catheterization 04/19/15 which showed an EF of 30-35 percent.  Assessment/Plan:   Principal Problem:   Acute respiratory failure with hypoxia  Although HCAP is in the differential, I suspect the patient has had a CHF exacerbation given his known EF of 30-35 percent and improvement in his symptoms after being given IV Lasix last night. Continue supplemental oxygen and refer to the plan of care as outlined below.  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 CBGs 115-172. Monitor glycemic control and adjust insulin as needed. Continue Cymbalta and Neurontin for neuropathy.    Acute on Chronic systolic heart failure (HCC)/coronary artery disease/hypertension/hyperlipidemia/mild troponin elevation EF 30-35 percent by heart catheterization done 04/19/15. Symptoms improving on Lasix, and I/O balance -2 L. Continue 40 mg of Lasix IV twice a day. Continue lisinopril, spironolactone and metoprolol.  With regard to underlying CAD/hyperlipidemia, continue aspirin,  Plavix, Ranexa, and statin. Mild elevation of troponin is likely related to demand ischemia. Trend flat. Troponin was WNL as morning.    Multifocal pneumonia, ruled out Suspect symptoms more likely from CHF exacerbation, given improved aeration bilaterally. No focal infiltrates noted, so will discontinue empiric antibiotics.  Influenza panel was negative. Chest x-ray personally reviewed:       Anxiety/depression Continue Cymbalta.    Hypokalemia Discontinue potassium supplementation, potassium 4.3 today after resuming spironolactone.    Pacemaker in situ   Family Communication/Anticipated D/C date and plan/Code Status   DVT prophylaxis: Lovenox ordered. Code Status: Full Code.  Family Communication: Wife updated at the bedside. Disposition Plan: Home in 24 hours depending on progress.   Medical Consultants:    None.   Procedures:    None  Anti-Infectives:    Rocephin 02/25/16--->02/27/16  Azithromycin 02/25/16---> 02/27/16  Subjective:   The patient reports that His breathing has improved but that he continues to have a nonproductive cough. No nausea or vomiting. Breathing is not quite back to baseline yet.  Objective:    Vitals:   02/26/16 1630 02/26/16 2113 02/26/16 2137 02/27/16 0652  BP:  (!) 140/55  (!) 130/101  Pulse:  63  (!) 57  Resp:  19  19  Temp:  99.2 F (37.3 C)  99 F (37.2 C)  TempSrc:  Oral  Oral  SpO2: 95% 97% 97% 97%  Weight:      Height:        Intake/Output Summary (Last 24 hours) at 02/27/16 0824 Last data filed at 02/27/16 0401  Gross per 24 hour  Intake              410 ml  Output  2400 ml  Net            -1990 ml   Filed Weights   02/25/16 1551 02/25/16 2139  Weight: 128.4 kg (283 lb) 124.8 kg (275 lb 1.6 oz)    Exam: General exam: Appears calm and comfortable. Morbidly obese. Respiratory system: Diminished throughout with large body habitus. Respiratory effort slightly increased. Cardiovascular system: S1 & S2  heard, RRR. No JVD,  rubs, gallops or clicks. No murmurs. Gastrointestinal system: Abdomen is nondistended, soft and nontender. No organomegaly or masses felt. Normal bowel sounds heard. Central nervous system: Alert and oriented. No focal neurological deficits. Extremities: No clubbing,  or cyanosis. 1+ edema. Skin: No rashes, lesions or ulcers. Psychiatry: Judgement and insight appear normal. Mood & affect appropriate.   Data Reviewed:   I have personally reviewed following labs and imaging studies:  Labs: Basic Metabolic Panel:  Recent Labs Lab 02/25/16 1600 02/26/16 0151 02/27/16 0342  NA 140 141 141  K 3.4* 3.2* 4.3  CL 105 103 104  CO2 26 27 28   GLUCOSE 125* 115* 155*  BUN 10 8 10   CREATININE 1.02 1.02 1.07  CALCIUM 8.7* 8.9 9.1   GFR Estimated Creatinine Clearance: 81.5 mL/min (by C-G formula based on SCr of 1.07 mg/dL). Liver Function Tests: No results for input(s): AST, ALT, ALKPHOS, BILITOT, PROT, ALBUMIN in the last 168 hours. No results for input(s): LIPASE, AMYLASE in the last 168 hours. No results for input(s): AMMONIA in the last 168 hours. Coagulation profile No results for input(s): INR, PROTIME in the last 168 hours.  CBC:  Recent Labs Lab 02/25/16 1600  WBC 7.8  NEUTROABS 5.9  HGB 10.9*  HCT 33.2*  MCV 97.1  PLT 206   Cardiac Enzymes:  Recent Labs Lab 02/25/16 1605 02/26/16 1007 02/26/16 1405 02/26/16 2032  TROPONINI 0.03* <0.03 0.05* <0.03   BNP (last 3 results) No results for input(s): PROBNP in the last 8760 hours. CBG:  Recent Labs Lab 02/26/16 0604 02/26/16 1118 02/26/16 1623 02/26/16 2110 02/27/16 0644  GLUCAP 172* 133* 115* 171* 132*   D-Dimer: No results for input(s): DDIMER in the last 72 hours. Hgb A1c: No results for input(s): HGBA1C in the last 72 hours. Lipid Profile: No results for input(s): CHOL, HDL, LDLCALC, TRIG, CHOLHDL, LDLDIRECT in the last 72 hours. Thyroid function studies: No results for  input(s): TSH, T4TOTAL, T3FREE, THYROIDAB in the last 72 hours.  Invalid input(s): FREET3 Anemia work up: No results for input(s): VITAMINB12, FOLATE, FERRITIN, TIBC, IRON, RETICCTPCT in the last 72 hours. Sepsis Labs:  Recent Labs Lab 02/25/16 1600  WBC 7.8    Microbiology Recent Results (from the past 240 hour(s))  Blood culture (routine x 2)     Status: None (Preliminary result)   Collection Time: 02/25/16  5:30 PM  Result Value Ref Range Status   Specimen Description BLOOD LEFT AC  Final   Special Requests BOTTLES DRAWN AEROBIC AND ANAEROBIC 5 CC EACH  Final   Culture   Final    NO GROWTH < 12 HOURS Performed at [facility]    Report Status PENDING  Incomplete  Blood culture (routine x 2)     Status: None (Preliminary result)   Collection Time: 02/25/16  6:00 PM  Result Value Ref Range Status   Specimen Description BLOOD LEFT HAND  Final   Special Requests IN PEDIATRIC BOTTLE 2CC  Final   Culture   Final    NO GROWTH < 12 HOURS Performed at  [facility]    Report Status PENDING  Incomplete    Radiology: Dg Chest 2 View  Result Date: 02/25/2016 CLINICAL DATA:  Getting breathing treatment, RN requested radiology to come back in about 10 minutes. shob and chest pain x today, had CXR at [facility] this am and was told he had fluid on his lungs. Previous pacemaker and 3 heart stents. EXAM: CHEST - 2 VIEW COMPARISON:  02/12/2016 FINDINGS: New poorly marginated airspace disease in the posterior right upper lobe, and to a lesser degree in the superior segment left lower lobe and left lung apex. Heart size upper limits normal as before. Left subclavian AICD stable. Mild blunting of the costophrenic angles suggesting small effusions. No pneumothorax. Visualized bones unremarkable. IMPRESSION: 1. New asymmetric airspace opacities bilaterally as above, may represent atypical edema versus pneumonia. Electronically Signed   By: Lucrezia Europe M.D.   On: 02/25/2016 16:29   Dg Chest  Port 1 View  Result Date: 02/27/2016 CLINICAL DATA:  Shortness of Breath EXAM: PORTABLE CHEST 1 VIEW COMPARISON:  02/25/2016 FINDINGS: Cardiac shadow is stable. Defibrillator is again seen. The lungs are well aerated bilaterally. Improved aeration is noted bilaterally with significant reduction and bilateral airspace disease. No new focal infiltrate is seen. No sizable effusion is noted. No bony abnormality is noted. IMPRESSION: Improved aeration bilaterally. Electronically Signed   By: Inez Catalina M.D.   On: 02/27/2016 07:18    Medications:   . aspirin EC  81 mg Oral Daily  . atorvastatin  40 mg Oral q1800  . azithromycin  500 mg Oral Q24H  . cefTRIAXone (ROCEPHIN)  IV  1 g Intravenous Q24H  . clopidogrel  75 mg Oral Daily  . docusate sodium  100 mg Oral BID  . DULoxetine  30 mg Oral Daily  . enoxaparin (LOVENOX) injection  40 mg Subcutaneous QHS  . furosemide  40 mg Intravenous BID  . furosemide  40 mg Oral Daily  . gabapentin  400 mg Oral QID  . insulin aspart  0-20 Units Subcutaneous TID WC  . insulin aspart  6 Units Subcutaneous TID WC  . insulin glargine  40 Units Subcutaneous QHS  . ipratropium-albuterol  3 mL Nebulization QID  . lisinopril  10 mg Oral BID  . metoprolol  25 mg Oral BID  . multivitamin with minerals  1 tablet Oral Daily  . oxyCODONE  20 mg Oral Q12H  . pantoprazole  40 mg Oral Daily  . polyethylene glycol  17 g Oral QHS  . potassium chloride  20 mEq Oral BID  . ranolazine  1,000 mg Oral BID  . spironolactone  25 mg Oral Daily  . tamsulosin  0.4 mg Oral QHS   Continuous Infusions:  Medical decision making is of high complexity and this patient is at high risk of deterioration, therefore this is a level 3 visit.  (> 4 problem points, >4 data points, high risk)   Problems/DDx Points   Self limited or minor (max 2)       1   Established problem, stable       1   Established problem, worsening       2   New problem, no additional W/U planned (max 1)        3   New problem, additional W/U planned        4    Data Reviewed Points   Review/order clinical lab tests       1   Review/order  x-rays       1   Review/order tests (Echo, EKG, PFTs, etc)       1   Discussion of test results w/ performing MD       1   Independent review of image, tracing or specimen       2   Decision to obtain old records       1   Review and summation of old records       2    Level of risk Presenting prob Diagnostics Management   Minimal 1 self limited/minor Labs CXR EKG/EEG U/A U/S Rest Gargles Bandages Dressings   Low 2 or more self limited/minor 1 stable chronic Acute uncomplicated illness Tests (PFTS) Non-CV imaging Arterial labs Biopsies of skin OTC drugs Minor surgery-no risk PT OT IVF without additives    Moderate 1 or more chronic illnesses w/ mild exac, progression or S/E from tx 2 or more stable chronic illnesses Undiagnosed new problem w/ uncertain prognosis Acute complicated injury  Stress tests Endoscopies with no risk factors Deep needle or incisional bx CV imaging without risk LP Thoracentesis Paracentesis Minor surgery w/ risks Elective major surgery w/ no risk (open, percutaneous or endoscopic) Prescription drugs Therapeutic nucl med IVF with additives Closed tx of fracture/dislocation    High Severe exac of chronic illness Acute or chronic illness/injury may pose a threat to life or bodily function (ARF) Change in neuro status    CV imaging w/ contrast and risk Cardio electophysiologic tests Endoscopies w/ risk Discography Elective major surgery Emergency major surgery Parenteral controlled substances Drug therapy req monitoring for toxicity DNR/de-escalation of care    MDM Prob points Data points Risk   Straightforward    <1    <1    Min   Low complexity    2    2    Low   Moderate    3    3    Mod   High Complexity    4 or more    4 or more    High      LOS: 2 days   Teja Judice  [facility] Pager [phone]. If unable to reach me by pager, please call my cell phone at [phone].  *Please refer to [email or web address], password [email or web address] to get updated schedule on who will round on this patient, as hospitalists switch teams weekly. If 7PM-7AM, please contact night-coverage at [email or web address], password [email or web address] for any overnight needs.  02/27/2016, 8:24 AM

## 2016-02-27 NOTE — Care Management Note (Signed)
Case Management Note Marvetta Gibbons RN, BSN Unit 2W-Case Manager [phone]  Patient Details  Name: Johnny Ochoa MRN: [id number] Date of Birth: 1945/10/29  Subjective/Objective:  Pt admitted with CAP                  Action/Plan: PTA pt lived at home- has a Cabin crew at [facility]. And also has a family MD at the [facility] (Dr. Zigmund Donnel) gets his meds at the [facility]- CM to follow for d/c needs.   Expected Discharge Date:                  Expected Discharge Plan:  [place]  In-House Referral:     Discharge planning Services  CM Consult  Post Acute Care Choice:    Choice offered to:     DME Arranged:    DME Agency:     HH Arranged:    [place] Agency:     Status of Service:  In process, will continue to follow  If discussed at Long Length of Stay Meetings, dates discussed:    Additional Comments:  02/27/16- 1600- Janay Canan RN, CM- referral for HR screen received- spoke with pt and wife at bedside- per conversation pt has cane, walker and scooter at home, also has scale but it is old. Pt has had HH in the past with [facility]- per pt he also is followed by [facility] along with plan for acupuncture in near future. Pt currently on 3L 02- does not have home 02- may need 02 on discharge- will need to qualify for home 02 prior to discharge- may benefit from PT eval- order placed per HF screen protocol. CM will continue to follow for recommendations and d/c needs.  Dawayne Patricia, RN 02/27/2016, 4:36 PM

## 2016-02-27 NOTE — Progress Notes (Addendum)
Inpatient Diabetes Program Recommendations  AACE/ADA: New Consensus Statement on Inpatient Glycemic Control (2015)  Target Ranges:  Prepandial:   less than 140 mg/dL      Peak postprandial:   less than 180 mg/dL (1-2 hours)      Critically ill patients:  140 - 180 mg/dL   Lab Results  Component Value Date   GLUCAP 168 (H) 02/27/2016   HGBA1C 7.9 (H) 06/20/2014   Results for Johnny Ochoa, Johnny Ochoa (MRN [id number]) as of 02/27/2016 12:32  Ref. Range 02/26/2016 11:18 02/26/2016 16:23 02/26/2016 21:10 02/27/2016 06:44 02/27/2016 11:31  Glucose-Capillary Latest Ref Range: 65 - 99 mg/dL 133 (H) 115 (H) 171 (H) 132 (H) 168 (H)   Review of Glycemic Control  Diabetes history: DM2, Obesity Outpatient Diabetes medications: Lantus 40 units QHS, Novolin 70/30 15-20 units BID, Soliqua 2-4 units daily (long acting Lantus plus GLP-1, was put on for weight loss per patient) Current orders for Inpatient glycemic control:   Inpatient Diabetes Program Recommendations:  Please consider discharging patient on Lantus and a GLP-1 (like Vistoza 0.6 to 1.8 mg daily) separately (not Soliqua and Lantus) to improve glycemic control.     Per ADA recommendations "consider performing an A1C on all patients with diabetes or hyperglycemia admitted to the hospital if not performed in the prior 3 months".  Spoke with patient at bedside regarding self-management of diabetes  1.  Taught patient the following (teach back and/or return demonstration):  Hypo/Hyperglycemia  Medications at D/C (what these are, why taking, when taking, how taking, common S.E.'s)  CBG monitoring, A1C  How/why to check feet every day  Why exercise is important   Carb modified diet  2.  Identified barriers and facilitators to self-management goals:  Trying to lose weight but having difficulty exercising secondary to bad knees and difficulty breathing.  3.  Identified support systems:  Wife at bedside while this RN talked with patient  Thank  you,  Windy Carina, RN, MSN Diabetes Coordinator Inpatient Diabetes Program [phone] (Team Pager)

## 2016-02-28 LAB — BASIC METABOLIC PANEL
Anion gap: 12 (ref 5–15)
BUN: 13 mg/dL (ref 6–20)
CO2: 29 mmol/L (ref 22–32)
Calcium: 9.1 mg/dL (ref 8.9–10.3)
Chloride: 98 mmol/L — ABNORMAL LOW (ref 101–111)
Creatinine, Ser: 1.21 mg/dL (ref 0.61–1.24)
GFR calc Af Amer: >60 mL/min (ref >60)
GFR calc non Af Amer: 58 mL/min — ABNORMAL LOW (ref >60)
Glucose, Bld: 159 mg/dL — ABNORMAL HIGH (ref 65–99)
Potassium: 3.7 mmol/L (ref 3.5–5.1)
Sodium: 139 mmol/L (ref 135–145)

## 2016-02-28 LAB — GLUCOSE, CAPILLARY
Glucose-Capillary: 131 mg/dL — ABNORMAL HIGH (ref 65–99)
Glucose-Capillary: 121 mg/dL — ABNORMAL HIGH (ref 65–99)

## 2016-02-28 MED ORDER — FUROSEMIDE 40 MG PO TABS: 40.0000 mg | ORAL_TABLET | Freq: Every day | ORAL | 3 refills | Status: AC

## 2016-02-28 NOTE — Evaluation (Signed)
Physical Therapy Evaluation Patient Details Name: Johnny Ochoa MRN: [id number] DOB: [birth year] Today's Date: 02/28/2016   History of Present Illness  Johnny Ochoa is an 71 y.o. male who normally manages his care at [facility], but who presented to the ED 02/25/16 for evaluation of chest pain/tightness accompanied by a nonproductive cough and persistent URI symptoms despite completing a course of doxycycline as an outpatient. Of note, the patient has a history of MI and CHF, and was also noted to have weight gain. Outside records reviewed. His cardiologist is Dr. Mahala Ochoa. He had an ICD replacement 10/16/15, and carries a diagnosis of chronic systolic CHF/ischemic cardiomyopathy.  He had a left heart catheterization 04/19/15 which showed an EF of 30-35 percent.  Clinical Impression  Pt admitted with above diagnosis. Pt currently with functional limitations due to the deficits listed below (see PT Problem List).  Pt will benefit from skilled PT to increase their independence and safety with mobility to allow discharge to the venue listed below.       Follow Up Recommendations Home health PT;Supervision/Assistance - 24 hour    Equipment Recommendations  None recommended by PT    Recommendations for Other Services       Precautions / Restrictions Precautions Precautions: Fall Restrictions Weight Bearing Restrictions: No      Mobility  Bed Mobility Overal bed mobility: Needs Assistance Bed Mobility: Supine to Sit;Sit to Supine     Supine to sit: Min assist;HOB elevated Sit to supine: Supervision      Transfers Overall transfer level: Needs assistance Equipment used: Lofstrands Transfers: Sit to/from Stand Sit to Stand: Min assist;From elevated surface            Ambulation/Gait Ambulation/Gait assistance: Mod assist;+2 safety/equipment (to monitor O2 and carry O2) Ambulation Distance (Feet): 70 Feet Assistive device: Lofstrands;1 person hand held assist (single loftstrand  (R) and HHA for last 30' amb) Gait Pattern/deviations: Step-to pattern;Decreased stance time - left;Decreased step length - right;Decreased weight shift to left   Gait velocity interpretation: Below normal speed for age/gender General Gait Details: initially monitoring O2 sats on RA, which maintained > 92% so removed pulse ox and offered HHA as pt reported increased pain and fatigue.  Pt with heavy reliance on forearm crutch and HHA so at this time recommend RW only for ambulation with assistance.  Pt/wife verbalized understanding  Stairs            Wheelchair Mobility    Modified Rankin (Stroke Patients Only)       Balance Overall balance assessment: Needs assistance Sitting-balance support: No upper extremity supported;Feet supported Sitting balance-Leahy Scale: Good     Standing balance support: Single extremity supported;During functional activity Standing balance-Leahy Scale: Poor                               Pertinent Vitals/Pain Pain Assessment: 0-10 Pain Score: 8  Pain Location: back and L knee Pain Intervention(s): Monitored during session;Limited activity within patient's tolerance;Repositioned;Patient requesting pain meds-RN notified    Home Living Family/patient expects to be discharged to:: Private residence Living Arrangements: Spouse/significant other Available Help at Discharge: Available 24 hours/day Type of Home: House Home Access: Stairs to enter;Other (comment) (has chair lift)     Home Layout: Two level (has chair lift) Home Equipment: Crutches;Walker - 2 wheels;Electric scooter (forearm crutches)      Prior Function Level of Independence: Independent with assistive device(s)  Comments: pt reports PLOF is limited to short community amb and household distances only. Typically uses single forearm crutch for household amb.     Hand Dominance        Extremity/Trunk Assessment        Lower Extremity  Assessment Lower Extremity Assessment: Generalized weakness       Communication   Communication: No difficulties  Cognition Arousal/Alertness: Awake/alert Behavior During Therapy: WFL for tasks assessed/performed Overall Cognitive Status: Within Functional Limits for tasks assessed                      General Comments General comments (skin integrity, edema, etc.): O2 sats >92% throughout session on RA; remaining off O2 at end of session per RN recommendations    Exercises     Assessment/Plan    PT Assessment Patient needs continued PT services  PT Problem List Decreased strength;Decreased activity tolerance;Decreased balance;Decreased mobility;Decreased knowledge of use of DME;Pain          PT Treatment Interventions DME instruction;Gait training;Functional mobility training;Therapeutic activities;Therapeutic exercise;Balance training;Neuromuscular re-education;Patient/family education    PT Goals (Current goals can be found in the Care Plan section)  Acute Rehab PT Goals Patient Stated Goal: to go home; wants to join [facility] PT Goal Formulation: With patient/family Time For Goal Achievement: 03/06/16 Potential to Achieve Goals: Good    Frequency Min 3X/week   Barriers to discharge        Co-evaluation               End of Session Equipment Utilized During Treatment: Gait belt (had O2 available; did not use) Activity Tolerance: Patient limited by fatigue;Patient limited by pain Patient left: in bed;with call [place]/phone within reach;with family/visitor present Nurse Communication: Mobility status         Time: 0911-0950 PT Time Calculation (min) (ACUTE ONLY): 39 min   Charges:   PT Evaluation $PT Eval High Complexity: 1 Procedure PT Treatments $Gait Training: 8-22 mins $Therapeutic Activity: 8-22 mins   PT G Codes:           Johnny Ochoa, PT, DPT 02/28/16 10:10 AM [phone]

## 2016-02-28 NOTE — Discharge Summary (Signed)
Physician Discharge Summary  Niquan Defina [id number] DOB: 29-Apr-1945 DOA: 02/25/2016  PCP: No PCP Per Patient  Admit date: 02/25/2016 Discharge date: 02/28/2016  Admitted From: Home Discharge disposition: Home   Recommendations for Outpatient Follow-Up:   1. Recommend close outpatient follow-up with cardiologist with reassessment of CHF symptoms, volume status and renal function. 2. Home health nursing and physical therapy ordered at discharge.   Discharge Diagnosis:   Principal Problem:    Acute respiratory failure with hypoxia (HCC) Secondary to decompensated CHF Active Problems:    Uncontrolled diabetes mellitus with complications (HCC)    Acute on chronic systolic CHF (congestive heart failure) (HCC)    CAP (community acquired pneumonia)    Multifocal pneumonia    Diabetic neuropathy ([place])    Essential hypertension    Coronary artery disease    Hyperlipidemia    Elevated troponin    Morbid obesity ([place])    Depression with anxiety    Hypokalemia    Cardiac pacemaker in situ  Discharge Condition: Improved.  Diet recommendation: Low sodium, heart healthy.  Carbohydrate-modified.  Regular.    History of Present Illness:   Johnny Ochoa is an 71 y.o. male who normally manages his care at [facility], but who presented to the ED 02/25/16 for evaluation of chest pain/tightness accompanied by a nonproductive cough and persistent URI symptoms despite completing a course of doxycycline as an outpatient. Of note, the patient has a history of MI and CHF, and was also noted to have weight gain. Outside records reviewed. His cardiologist is Dr. Mahala Menghini. He had an ICD replacement 10/16/15, and carries a diagnosis of chronic systolic CHF/ischemic cardiomyopathy.  He had a left heart catheterization 04/19/15 which showed an EF of 30-35 percent.   Hospital Course by Problem:   Principal Problem:   Acute respiratory failure with hypoxia  Although HCAP is in the  differential, I suspect the patient has had a CHF exacerbation given his known EF of 30-35 percent and improvement in his symptoms after being given IV Lasix. Supplemental oxygen weaned with diuresis.  Active Problems:   Uncontrolled Diabetes ([place]) with neurological complications/peripheral neuropathy/Morbid obesity Currently being managed with 40 units of Lantus daily, insulin resistant SSI, and 6 units of NovoLog with meals. CBGs 115-172. Resume preadmission regimen at discharge. Continue Cymbalta and Neurontin for neuropathy.    Acute on Chronic systolic heart failure (HCC)/coronary artery disease/hypertension/hyperlipidemia/mild troponin elevation EF 30-35 percent by heart catheterization done 04/19/15. Symptoms improving on Lasix, and I/O balance -2 L. weight down a total of 8 pounds. Continue lisinopril, spironolactone and metoprolol.  With regard to underlying CAD/hyperlipidemia, continue aspirin, Plavix, Ranexa, and statin. Mild elevation of troponin is likely related to demand ischemia. Trend flat and subsequently normalized. Discharge home on Lasix 40 mg twice a day 3 days then go back to daily with instructions to increase Lasix to twice a day for any 2 pound weight gain/24 hours.    Multifocal pneumonia, ruled out Suspect symptoms more likely from CHF exacerbation, given improved aeration bilaterally. No focal infiltrates noted, so will discontinue empiric antibiotics.  Influenza panel was negative. Chest x-ray personally reviewed:       Anxiety/depression Continue Cymbalta.    Hypokalemia Discontinue potassium supplementation, potassium 4.3 today after resuming spironolactone.    Pacemaker in situ  Medical Consultants:    None.   Discharge Exam:   Vitals:   02/27/16 2027 02/28/16 1034  BP: 136/68 136/64  Pulse: 60 65  Resp: 18   Temp: 98.5  F (36.9 C)    Vitals:   02/27/16 2027 02/27/16 2038 02/28/16 1000 02/28/16 1034  BP: 136/68   136/64  Pulse: 60   65    Resp: 18     Temp: 98.5 F (36.9 C)     TempSrc: Oral     SpO2: 95% 96% 94%   Weight:      Height:       General exam: Appears calm and comfortable. Morbidly obese. Respiratory system: Diminished throughout with large body habitus. Respiratory effort unlabored. Cardiovascular system: S1 & S2 heard, RRR. No JVD,  rubs, gallops or clicks. No murmurs. Gastrointestinal system: Abdomen is nondistended, soft and nontender. No organomegaly or masses felt. Normal bowel sounds heard. Central nervous system: Alert and oriented. No focal neurological deficits. Extremities: No clubbing,  or cyanosis. 1+ edema. Skin: No rashes, lesions or ulcers. Psychiatry: Judgement and insight appear normal. Mood & affect appropriate.    The results of significant diagnostics from this hospitalization (including imaging, microbiology, ancillary and laboratory) are listed below for reference.     Procedures and Diagnostic Studies:   Dg Chest 2 View  Result Date: 02/25/2016 CLINICAL DATA:  Getting breathing treatment, RN requested radiology to come back in about 10 minutes. shob and chest pain x today, had CXR at [facility] this am and was told he had fluid on his lungs. Previous pacemaker and 3 heart stents. EXAM: CHEST - 2 VIEW COMPARISON:  02/12/2016 FINDINGS: New poorly marginated airspace disease in the posterior right upper lobe, and to a lesser degree in the superior segment left lower lobe and left lung apex. Heart size upper limits normal as before. Left subclavian AICD stable. Mild blunting of the costophrenic angles suggesting small effusions. No pneumothorax. Visualized bones unremarkable. IMPRESSION: 1. New asymmetric airspace opacities bilaterally as above, may represent atypical edema versus pneumonia. Electronically Signed   By: Lucrezia Europe M.D.   On: 02/25/2016 16:29     Labs:   Basic Metabolic Panel:  Recent Labs Lab 02/25/16 1600 02/26/16 0151 02/27/16 0342 02/28/16 0326  NA 140 141 141 139  K  3.4* 3.2* 4.3 3.7  CL 105 103 104 98*  CO2 26 27 28 29   GLUCOSE 125* 115* 155* 159*  BUN 10 8 10 13   CREATININE 1.02 1.02 1.07 1.21  CALCIUM 8.7* 8.9 9.1 9.1   GFR Estimated Creatinine Clearance: 72.1 mL/min (by C-G formula based on SCr of 1.21 mg/dL). Liver Function Tests: No results for input(s): AST, ALT, ALKPHOS, BILITOT, PROT, ALBUMIN in the last 168 hours. No results for input(s): LIPASE, AMYLASE in the last 168 hours. No results for input(s): AMMONIA in the last 168 hours. Coagulation profile No results for input(s): INR, PROTIME in the last 168 hours.  CBC:  Recent Labs Lab 02/25/16 1600  WBC 7.8  NEUTROABS 5.9  HGB 10.9*  HCT 33.2*  MCV 97.1  PLT 206   Cardiac Enzymes:  Recent Labs Lab 02/25/16 1605 02/26/16 1007 02/26/16 1405 02/26/16 2032  TROPONINI 0.03* <0.03 0.05* <0.03   CBG:  Recent Labs Lab 02/27/16 1131 02/27/16 1628 02/27/16 2127 02/28/16 0605 02/28/16 1115  GLUCAP 168* 144* 117* 131* 121*   Microbiology Recent Results (from the past 240 hour(s))  Blood culture (routine x 2)     Status: None (Preliminary result)   Collection Time: 02/25/16  5:30 PM  Result Value Ref Range Status   Specimen Description BLOOD LEFT AC  Final   Special Requests BOTTLES DRAWN AEROBIC AND ANAEROBIC 5  CC EACH  Final   Culture   Final    NO GROWTH 2 DAYS Performed at [facility]    Report Status PENDING  Incomplete  Blood culture (routine x 2)     Status: None (Preliminary result)   Collection Time: 02/25/16  6:00 PM  Result Value Ref Range Status   Specimen Description BLOOD LEFT HAND  Final   Special Requests IN PEDIATRIC BOTTLE 2CC  Final   Culture   Final    NO GROWTH 2 DAYS Performed at [facility]    Report Status PENDING  Incomplete     Discharge Instructions:   Discharge Instructions    ([place]) Call MD:  Anytime you have any of the following symptoms: 1) 3 pound weight gain in 24 hours or 5 pounds in 1  week 2) shortness of breath, with or without a dry hacking cough 3) swelling in the hands, feet or stomach 4) if you have to sleep on extra pillows at night in order to breathe.    Complete by:  As directed    Call MD for:  difficulty breathing, headache or visual disturbances    Complete by:  As directed    Call MD for:  extreme fatigue    Complete by:  As directed    Call MD for:  persistant dizziness or light-headedness    Complete by:  As directed    Diet - low sodium heart healthy    Complete by:  As directed    Diet Carb Modified    Complete by:  As directed    Discharge instructions    Complete by:  As directed    You were treated for heart failure in the hospital.  To prevent exacerbations of your heart failure, it is important that you check your weight at the same time every day, and that if you gain over 3 pounds in 24 hours or 5 lbs in 1 week, OR you develop worsening swelling to the legs, experience more shortness of breath or chest pain, take an extra dose of Lasix and call your Primary MD or cardiologist.   Follow a heart healthy, low salt diet and restrict your fluid intake to 1.5 liters a day or less.   Increase activity slowly    Complete by:  As directed      Allergies as of 02/28/2016   No Known Allergies     Medication List    TAKE these medications   albuterol 108 (90 Base) MCG/ACT inhaler Commonly known as:  PROVENTIL HFA;VENTOLIN HFA Inhale 2 puffs into the lungs every 4 (four) hours as needed for wheezing or shortness of breath.   aspirin EC 81 MG tablet Take 81 mg by mouth daily.   atorvastatin 80 MG tablet Commonly known as:  LIPITOR Take 80 mg by mouth daily.   clopidogrel 75 MG tablet Commonly known as:  PLAVIX Take 75 mg by mouth daily.   docusate sodium 100 MG capsule Commonly known as:  COLACE Take 100 mg by mouth 2 (two) times daily.   DULoxetine 30 MG capsule Commonly known as:  CYMBALTA Take 30 mg by mouth daily.   furosemide 40 MG  tablet Commonly known as:  LASIX Take 1 tablet (40 mg total) by mouth daily. Take an extra dose if you gain 2 lbs in 24 hours. What changed:  additional instructions   gabapentin 400 MG capsule Commonly known as:  NEURONTIN Take 400 mg by mouth  4 (four) times daily.   insulin glargine 100 UNIT/ML injection Commonly known as:  LANTUS Inject 40 Units into the skin at bedtime.   insulin NPH-regular Human (70-30) 100 UNIT/ML injection Commonly known as:  NOVOLIN 70/30 Inject 15-20 Units into the skin 3 (three) times daily. 15 units at breakfast and lunch, then 20 units at dinner time   lisinopril 20 MG tablet Commonly known as:  PRINIVIL,ZESTRIL Take 10 mg by mouth 2 (two) times daily.   metoprolol 50 MG tablet Commonly known as:  LOPRESSOR Take 25 mg by mouth 2 (two) times daily.   multivitamin tablet Take 1 tablet by mouth daily.   nitroGLYCERIN 0.4 MG SL tablet Commonly known as:  NITROSTAT Place 0.4 mg under the tongue.   omeprazole 20 MG capsule Commonly known as:  PRILOSEC Take 40 mg by mouth 2 (two) times daily before a meal.   polyethylene glycol packet Commonly known as:  MIRALAX / GLYCOLAX Take 17 g by mouth at bedtime.   ranolazine 1000 MG SR tablet Commonly known as:  RANEXA Take 1,000 mg by mouth 2 (two) times daily.   SOLIQUA 100-33 UNT-MCG/ML Sopn Generic drug:  Insulin Glargine-Lixisenatide Inject 2-4 Units into the skin every morning.   spironolactone 25 MG tablet Commonly known as:  ALDACTONE Take 25 mg by mouth daily.   tamsulosin 0.4 MG Caps capsule Commonly known as:  FLOMAX Take 0.8 mg by mouth at bedtime.   terbinafine 1 % cream Commonly known as:  LAMISIL Apply 1 application topically daily as needed for itching.   traZODone 100 MG tablet Commonly known as:  DESYREL Take 100 mg by mouth at bedtime.      Follow-up Information    Mahala Menghini, MD. Go on 03/02/2016.   Specialty:  Cardiology Why:  2:00PM Contact information: [address] [place] [address] [place] Follow up.   Why:  HHRN/PT arranged- they will call you to set up home visits Contact information: [address] [place] [address] [phone]            Time coordinating discharge: Greater than 30 minutes.  Signed:  RAMA,CHRISTINA  Pager [phone] [facility] 02/28/2016, 4:22 PM

## 2016-02-28 NOTE — Care Management Important Message (Signed)
Important Message  Patient Details  Name: Johnny Ochoa MRN: [id number] Date of Birth: 04/16/1945   Medicare Important Message Given:  Yes    Nathen May 02/28/2016, 12:32 PM

## 2016-02-28 NOTE — Progress Notes (Signed)
Patient discharged to home with belongings, IVs and tele removed. Patient given AVS and prescriptions, patient stable at time of discharge.

## 2016-02-28 NOTE — Care Management Note (Signed)
Case Management Note Marvetta Gibbons RN, BSN Unit 2W-Case Manager [phone]  Patient Details  Name: Johnny Ochoa MRN: [id number] Date of Birth: December 27, 1945  Subjective/Objective:  Pt admitted with CAP                  Action/Plan: PTA pt lived at home- has a Cabin crew at [facility]. And also has a family MD at the [facility] (Dr. Zigmund David) gets his meds at the [facility]- CM to follow for d/c needs.   Expected Discharge Date:  02/28/16               Expected Discharge Plan:  [place]  In-House Referral:     Discharge planning Services  CM Consult  Post Acute Care Choice:  Home Health Choice offered to:  Patient, Spouse  DME Arranged:    DME Agency:     HH Arranged:  RN, Disease Management, PT [place] Agency:  [place]  Status of Service:  Completed, signed off  If discussed at [place] of Stay Meetings, dates discussed:    Discharge Disposition: home with home health   Additional Comments:  02/28/16- 1230- Johnny Heagle RN, CM- pt for d/c home today- has been weaned to room air- and did not qualify for home 02 with ambulating sats- per PT eval recommendations for HHPT- orders placed for HHRN/PT- spoke with pt and wife- choice offered for [facility] agency- they would like to use [facility] again for services- referral called to [facility] with [facility] for HHRN/PT with HF management.   02/27/16- 1600- Johnny Blixt RN, CM- referral for HR screen received- spoke with pt and wife at bedside- per conversation pt has cane, walker and scooter at home, also has scale but it is old. Pt has had HH in the past with [facility]- per pt he also is followed by [facility] along with plan for acupuncture in near future. Pt currently on 3L 02- does not have home 02- may need 02 on discharge- will need to qualify for home 02 prior to discharge- may benefit from PT eval- order placed per HF screen protocol. CM will continue to follow for  recommendations and d/c needs.  Dawayne Patricia, RN 02/28/2016, 12:38 PM

## 2016-03-01 LAB — CULTURE, BLOOD (ROUTINE X 2)
Culture: NO GROWTH
Culture: NO GROWTH

## 2016-03-02 DIAGNOSIS — I255 Ischemic cardiomyopathy: Secondary | ICD-10-CM | POA: Diagnosis not present

## 2016-03-02 DIAGNOSIS — Z95810 Presence of automatic (implantable) cardiac defibrillator: Secondary | ICD-10-CM | POA: Diagnosis not present

## 2016-03-02 DIAGNOSIS — I5022 Chronic systolic (congestive) heart failure: Secondary | ICD-10-CM | POA: Diagnosis not present

## 2016-03-02 DIAGNOSIS — E1143 Type 2 diabetes mellitus with diabetic autonomic (poly)neuropathy: Secondary | ICD-10-CM | POA: Diagnosis not present

## 2016-03-02 DIAGNOSIS — G4733 Obstructive sleep apnea (adult) (pediatric): Secondary | ICD-10-CM | POA: Diagnosis not present

## 2016-03-05 DIAGNOSIS — I429 Cardiomyopathy, unspecified: Secondary | ICD-10-CM | POA: Diagnosis not present

## 2016-03-05 DIAGNOSIS — D649 Anemia, unspecified: Secondary | ICD-10-CM | POA: Diagnosis not present

## 2016-03-05 DIAGNOSIS — N189 Chronic kidney disease, unspecified: Secondary | ICD-10-CM | POA: Diagnosis not present

## 2016-03-05 DIAGNOSIS — I509 Heart failure, unspecified: Secondary | ICD-10-CM | POA: Diagnosis not present

## 2016-03-05 DIAGNOSIS — E785 Hyperlipidemia, unspecified: Secondary | ICD-10-CM | POA: Diagnosis not present

## 2016-03-05 DIAGNOSIS — E1165 Type 2 diabetes mellitus with hyperglycemia: Secondary | ICD-10-CM | POA: Diagnosis not present

## 2016-03-05 DIAGNOSIS — I252 Old myocardial infarction: Secondary | ICD-10-CM | POA: Diagnosis not present

## 2016-03-05 DIAGNOSIS — Z794 Long term (current) use of insulin: Secondary | ICD-10-CM | POA: Diagnosis not present

## 2016-03-05 DIAGNOSIS — Z79891 Long term (current) use of opiate analgesic: Secondary | ICD-10-CM | POA: Diagnosis not present

## 2016-03-05 DIAGNOSIS — E1122 Type 2 diabetes mellitus with diabetic chronic kidney disease: Secondary | ICD-10-CM | POA: Diagnosis not present

## 2016-03-05 DIAGNOSIS — G4733 Obstructive sleep apnea (adult) (pediatric): Secondary | ICD-10-CM | POA: Diagnosis not present

## 2016-03-05 DIAGNOSIS — E1142 Type 2 diabetes mellitus with diabetic polyneuropathy: Secondary | ICD-10-CM | POA: Diagnosis not present

## 2016-03-05 DIAGNOSIS — I130 Hypertensive heart and chronic kidney disease with heart failure and stage 1 through stage 4 chronic kidney disease, or unspecified chronic kidney disease: Secondary | ICD-10-CM | POA: Diagnosis not present

## 2016-03-05 DIAGNOSIS — Z7901 Long term (current) use of anticoagulants: Secondary | ICD-10-CM | POA: Diagnosis not present

## 2016-03-05 DIAGNOSIS — Z950 Presence of cardiac pacemaker: Secondary | ICD-10-CM | POA: Diagnosis not present

## 2016-03-05 DIAGNOSIS — I2510 Atherosclerotic heart disease of native coronary artery without angina pectoris: Secondary | ICD-10-CM | POA: Diagnosis not present

## 2016-03-06 DIAGNOSIS — Z7901 Long term (current) use of anticoagulants: Secondary | ICD-10-CM | POA: Diagnosis not present

## 2016-03-06 DIAGNOSIS — I2510 Atherosclerotic heart disease of native coronary artery without angina pectoris: Secondary | ICD-10-CM | POA: Diagnosis not present

## 2016-03-06 DIAGNOSIS — I130 Hypertensive heart and chronic kidney disease with heart failure and stage 1 through stage 4 chronic kidney disease, or unspecified chronic kidney disease: Secondary | ICD-10-CM | POA: Diagnosis not present

## 2016-03-06 DIAGNOSIS — Z950 Presence of cardiac pacemaker: Secondary | ICD-10-CM | POA: Diagnosis not present

## 2016-03-06 DIAGNOSIS — Z79891 Long term (current) use of opiate analgesic: Secondary | ICD-10-CM | POA: Diagnosis not present

## 2016-03-06 DIAGNOSIS — I509 Heart failure, unspecified: Secondary | ICD-10-CM | POA: Diagnosis not present

## 2016-03-06 DIAGNOSIS — E1122 Type 2 diabetes mellitus with diabetic chronic kidney disease: Secondary | ICD-10-CM | POA: Diagnosis not present

## 2016-03-06 DIAGNOSIS — Z794 Long term (current) use of insulin: Secondary | ICD-10-CM | POA: Diagnosis not present

## 2016-03-06 DIAGNOSIS — I252 Old myocardial infarction: Secondary | ICD-10-CM | POA: Diagnosis not present

## 2016-03-06 DIAGNOSIS — E1142 Type 2 diabetes mellitus with diabetic polyneuropathy: Secondary | ICD-10-CM | POA: Diagnosis not present

## 2016-03-06 DIAGNOSIS — E785 Hyperlipidemia, unspecified: Secondary | ICD-10-CM | POA: Diagnosis not present

## 2016-03-06 DIAGNOSIS — I429 Cardiomyopathy, unspecified: Secondary | ICD-10-CM | POA: Diagnosis not present

## 2016-03-06 DIAGNOSIS — N189 Chronic kidney disease, unspecified: Secondary | ICD-10-CM | POA: Diagnosis not present

## 2016-03-06 DIAGNOSIS — D649 Anemia, unspecified: Secondary | ICD-10-CM | POA: Diagnosis not present

## 2016-03-06 DIAGNOSIS — E1165 Type 2 diabetes mellitus with hyperglycemia: Secondary | ICD-10-CM | POA: Diagnosis not present

## 2016-03-06 DIAGNOSIS — G4733 Obstructive sleep apnea (adult) (pediatric): Secondary | ICD-10-CM | POA: Diagnosis not present

## 2016-03-09 DIAGNOSIS — E785 Hyperlipidemia, unspecified: Secondary | ICD-10-CM | POA: Diagnosis not present

## 2016-03-09 DIAGNOSIS — D649 Anemia, unspecified: Secondary | ICD-10-CM | POA: Diagnosis not present

## 2016-03-09 DIAGNOSIS — G4733 Obstructive sleep apnea (adult) (pediatric): Secondary | ICD-10-CM | POA: Diagnosis not present

## 2016-03-09 DIAGNOSIS — E1122 Type 2 diabetes mellitus with diabetic chronic kidney disease: Secondary | ICD-10-CM | POA: Diagnosis not present

## 2016-03-09 DIAGNOSIS — Z794 Long term (current) use of insulin: Secondary | ICD-10-CM | POA: Diagnosis not present

## 2016-03-09 DIAGNOSIS — E118 Type 2 diabetes mellitus with unspecified complications: Secondary | ICD-10-CM | POA: Diagnosis not present

## 2016-03-09 DIAGNOSIS — I252 Old myocardial infarction: Secondary | ICD-10-CM | POA: Diagnosis not present

## 2016-03-09 DIAGNOSIS — Z09 Encounter for follow-up examination after completed treatment for conditions other than malignant neoplasm: Secondary | ICD-10-CM | POA: Diagnosis not present

## 2016-03-09 DIAGNOSIS — I429 Cardiomyopathy, unspecified: Secondary | ICD-10-CM | POA: Diagnosis not present

## 2016-03-09 DIAGNOSIS — Z7901 Long term (current) use of anticoagulants: Secondary | ICD-10-CM | POA: Diagnosis not present

## 2016-03-09 DIAGNOSIS — I130 Hypertensive heart and chronic kidney disease with heart failure and stage 1 through stage 4 chronic kidney disease, or unspecified chronic kidney disease: Secondary | ICD-10-CM | POA: Diagnosis not present

## 2016-03-09 DIAGNOSIS — Z950 Presence of cardiac pacemaker: Secondary | ICD-10-CM | POA: Diagnosis not present

## 2016-03-09 DIAGNOSIS — I509 Heart failure, unspecified: Secondary | ICD-10-CM | POA: Diagnosis not present

## 2016-03-09 DIAGNOSIS — Z79891 Long term (current) use of opiate analgesic: Secondary | ICD-10-CM | POA: Diagnosis not present

## 2016-03-09 DIAGNOSIS — N189 Chronic kidney disease, unspecified: Secondary | ICD-10-CM | POA: Diagnosis not present

## 2016-03-09 DIAGNOSIS — E1142 Type 2 diabetes mellitus with diabetic polyneuropathy: Secondary | ICD-10-CM | POA: Diagnosis not present

## 2016-03-09 DIAGNOSIS — E1165 Type 2 diabetes mellitus with hyperglycemia: Secondary | ICD-10-CM | POA: Diagnosis not present

## 2016-03-09 DIAGNOSIS — I2510 Atherosclerotic heart disease of native coronary artery without angina pectoris: Secondary | ICD-10-CM | POA: Diagnosis not present

## 2016-03-10 DIAGNOSIS — R269 Unspecified abnormalities of gait and mobility: Secondary | ICD-10-CM | POA: Diagnosis not present

## 2016-03-11 DIAGNOSIS — D649 Anemia, unspecified: Secondary | ICD-10-CM | POA: Diagnosis not present

## 2016-03-11 DIAGNOSIS — G4733 Obstructive sleep apnea (adult) (pediatric): Secondary | ICD-10-CM | POA: Diagnosis not present

## 2016-03-11 DIAGNOSIS — Z79891 Long term (current) use of opiate analgesic: Secondary | ICD-10-CM | POA: Diagnosis not present

## 2016-03-11 DIAGNOSIS — N189 Chronic kidney disease, unspecified: Secondary | ICD-10-CM | POA: Diagnosis not present

## 2016-03-11 DIAGNOSIS — I509 Heart failure, unspecified: Secondary | ICD-10-CM | POA: Diagnosis not present

## 2016-03-11 DIAGNOSIS — I130 Hypertensive heart and chronic kidney disease with heart failure and stage 1 through stage 4 chronic kidney disease, or unspecified chronic kidney disease: Secondary | ICD-10-CM | POA: Diagnosis not present

## 2016-03-11 DIAGNOSIS — E1142 Type 2 diabetes mellitus with diabetic polyneuropathy: Secondary | ICD-10-CM | POA: Diagnosis not present

## 2016-03-11 DIAGNOSIS — I429 Cardiomyopathy, unspecified: Secondary | ICD-10-CM | POA: Diagnosis not present

## 2016-03-11 DIAGNOSIS — E1122 Type 2 diabetes mellitus with diabetic chronic kidney disease: Secondary | ICD-10-CM | POA: Diagnosis not present

## 2016-03-11 DIAGNOSIS — Z7901 Long term (current) use of anticoagulants: Secondary | ICD-10-CM | POA: Diagnosis not present

## 2016-03-11 DIAGNOSIS — E785 Hyperlipidemia, unspecified: Secondary | ICD-10-CM | POA: Diagnosis not present

## 2016-03-11 DIAGNOSIS — I252 Old myocardial infarction: Secondary | ICD-10-CM | POA: Diagnosis not present

## 2016-03-11 DIAGNOSIS — E1165 Type 2 diabetes mellitus with hyperglycemia: Secondary | ICD-10-CM | POA: Diagnosis not present

## 2016-03-11 DIAGNOSIS — Z794 Long term (current) use of insulin: Secondary | ICD-10-CM | POA: Diagnosis not present

## 2016-03-11 DIAGNOSIS — Z950 Presence of cardiac pacemaker: Secondary | ICD-10-CM | POA: Diagnosis not present

## 2016-03-11 DIAGNOSIS — I2510 Atherosclerotic heart disease of native coronary artery without angina pectoris: Secondary | ICD-10-CM | POA: Diagnosis not present

## 2016-03-12 DIAGNOSIS — Z7901 Long term (current) use of anticoagulants: Secondary | ICD-10-CM | POA: Diagnosis not present

## 2016-03-12 DIAGNOSIS — Z794 Long term (current) use of insulin: Secondary | ICD-10-CM | POA: Diagnosis not present

## 2016-03-12 DIAGNOSIS — I252 Old myocardial infarction: Secondary | ICD-10-CM | POA: Diagnosis not present

## 2016-03-12 DIAGNOSIS — D649 Anemia, unspecified: Secondary | ICD-10-CM | POA: Diagnosis not present

## 2016-03-12 DIAGNOSIS — I130 Hypertensive heart and chronic kidney disease with heart failure and stage 1 through stage 4 chronic kidney disease, or unspecified chronic kidney disease: Secondary | ICD-10-CM | POA: Diagnosis not present

## 2016-03-12 DIAGNOSIS — E1122 Type 2 diabetes mellitus with diabetic chronic kidney disease: Secondary | ICD-10-CM | POA: Diagnosis not present

## 2016-03-12 DIAGNOSIS — Z950 Presence of cardiac pacemaker: Secondary | ICD-10-CM | POA: Diagnosis not present

## 2016-03-12 DIAGNOSIS — E785 Hyperlipidemia, unspecified: Secondary | ICD-10-CM | POA: Diagnosis not present

## 2016-03-12 DIAGNOSIS — N189 Chronic kidney disease, unspecified: Secondary | ICD-10-CM | POA: Diagnosis not present

## 2016-03-12 DIAGNOSIS — E1165 Type 2 diabetes mellitus with hyperglycemia: Secondary | ICD-10-CM | POA: Diagnosis not present

## 2016-03-12 DIAGNOSIS — I2510 Atherosclerotic heart disease of native coronary artery without angina pectoris: Secondary | ICD-10-CM | POA: Diagnosis not present

## 2016-03-12 DIAGNOSIS — G4733 Obstructive sleep apnea (adult) (pediatric): Secondary | ICD-10-CM | POA: Diagnosis not present

## 2016-03-12 DIAGNOSIS — I429 Cardiomyopathy, unspecified: Secondary | ICD-10-CM | POA: Diagnosis not present

## 2016-03-12 DIAGNOSIS — I509 Heart failure, unspecified: Secondary | ICD-10-CM | POA: Diagnosis not present

## 2016-03-12 DIAGNOSIS — E1142 Type 2 diabetes mellitus with diabetic polyneuropathy: Secondary | ICD-10-CM | POA: Diagnosis not present

## 2016-03-12 DIAGNOSIS — Z79891 Long term (current) use of opiate analgesic: Secondary | ICD-10-CM | POA: Diagnosis not present

## 2016-03-13 DIAGNOSIS — Z79891 Long term (current) use of opiate analgesic: Secondary | ICD-10-CM | POA: Diagnosis not present

## 2016-03-13 DIAGNOSIS — Z7901 Long term (current) use of anticoagulants: Secondary | ICD-10-CM | POA: Diagnosis not present

## 2016-03-13 DIAGNOSIS — D649 Anemia, unspecified: Secondary | ICD-10-CM | POA: Diagnosis not present

## 2016-03-13 DIAGNOSIS — E1122 Type 2 diabetes mellitus with diabetic chronic kidney disease: Secondary | ICD-10-CM | POA: Diagnosis not present

## 2016-03-13 DIAGNOSIS — I2510 Atherosclerotic heart disease of native coronary artery without angina pectoris: Secondary | ICD-10-CM | POA: Diagnosis not present

## 2016-03-13 DIAGNOSIS — G4733 Obstructive sleep apnea (adult) (pediatric): Secondary | ICD-10-CM | POA: Diagnosis not present

## 2016-03-13 DIAGNOSIS — Z950 Presence of cardiac pacemaker: Secondary | ICD-10-CM | POA: Diagnosis not present

## 2016-03-13 DIAGNOSIS — E1142 Type 2 diabetes mellitus with diabetic polyneuropathy: Secondary | ICD-10-CM | POA: Diagnosis not present

## 2016-03-13 DIAGNOSIS — I509 Heart failure, unspecified: Secondary | ICD-10-CM | POA: Diagnosis not present

## 2016-03-13 DIAGNOSIS — I429 Cardiomyopathy, unspecified: Secondary | ICD-10-CM | POA: Diagnosis not present

## 2016-03-13 DIAGNOSIS — E785 Hyperlipidemia, unspecified: Secondary | ICD-10-CM | POA: Diagnosis not present

## 2016-03-13 DIAGNOSIS — E1165 Type 2 diabetes mellitus with hyperglycemia: Secondary | ICD-10-CM | POA: Diagnosis not present

## 2016-03-13 DIAGNOSIS — N189 Chronic kidney disease, unspecified: Secondary | ICD-10-CM | POA: Diagnosis not present

## 2016-03-13 DIAGNOSIS — I130 Hypertensive heart and chronic kidney disease with heart failure and stage 1 through stage 4 chronic kidney disease, or unspecified chronic kidney disease: Secondary | ICD-10-CM | POA: Diagnosis not present

## 2016-03-13 DIAGNOSIS — I252 Old myocardial infarction: Secondary | ICD-10-CM | POA: Diagnosis not present

## 2016-03-13 DIAGNOSIS — Z794 Long term (current) use of insulin: Secondary | ICD-10-CM | POA: Diagnosis not present

## 2016-03-16 DIAGNOSIS — I10 Essential (primary) hypertension: Secondary | ICD-10-CM | POA: Diagnosis not present

## 2016-03-16 DIAGNOSIS — F4310 Post-traumatic stress disorder, unspecified: Secondary | ICD-10-CM | POA: Diagnosis not present

## 2016-03-16 DIAGNOSIS — E118 Type 2 diabetes mellitus with unspecified complications: Secondary | ICD-10-CM | POA: Diagnosis not present

## 2016-03-16 DIAGNOSIS — I5023 Acute on chronic systolic (congestive) heart failure: Secondary | ICD-10-CM | POA: Diagnosis not present

## 2016-03-16 DIAGNOSIS — Z0000 Encounter for general adult medical examination without abnormal findings: Secondary | ICD-10-CM | POA: Diagnosis not present

## 2016-03-18 DIAGNOSIS — I509 Heart failure, unspecified: Secondary | ICD-10-CM | POA: Diagnosis not present

## 2016-03-18 DIAGNOSIS — E1165 Type 2 diabetes mellitus with hyperglycemia: Secondary | ICD-10-CM | POA: Diagnosis not present

## 2016-03-18 DIAGNOSIS — I252 Old myocardial infarction: Secondary | ICD-10-CM | POA: Diagnosis not present

## 2016-03-18 DIAGNOSIS — Z950 Presence of cardiac pacemaker: Secondary | ICD-10-CM | POA: Diagnosis not present

## 2016-03-18 DIAGNOSIS — E785 Hyperlipidemia, unspecified: Secondary | ICD-10-CM | POA: Diagnosis not present

## 2016-03-18 DIAGNOSIS — G4733 Obstructive sleep apnea (adult) (pediatric): Secondary | ICD-10-CM | POA: Diagnosis not present

## 2016-03-18 DIAGNOSIS — I429 Cardiomyopathy, unspecified: Secondary | ICD-10-CM | POA: Diagnosis not present

## 2016-03-18 DIAGNOSIS — N189 Chronic kidney disease, unspecified: Secondary | ICD-10-CM | POA: Diagnosis not present

## 2016-03-18 DIAGNOSIS — Z7901 Long term (current) use of anticoagulants: Secondary | ICD-10-CM | POA: Diagnosis not present

## 2016-03-18 DIAGNOSIS — E1122 Type 2 diabetes mellitus with diabetic chronic kidney disease: Secondary | ICD-10-CM | POA: Diagnosis not present

## 2016-03-18 DIAGNOSIS — I130 Hypertensive heart and chronic kidney disease with heart failure and stage 1 through stage 4 chronic kidney disease, or unspecified chronic kidney disease: Secondary | ICD-10-CM | POA: Diagnosis not present

## 2016-03-18 DIAGNOSIS — D649 Anemia, unspecified: Secondary | ICD-10-CM | POA: Diagnosis not present

## 2016-03-18 DIAGNOSIS — Z794 Long term (current) use of insulin: Secondary | ICD-10-CM | POA: Diagnosis not present

## 2016-03-18 DIAGNOSIS — Z79891 Long term (current) use of opiate analgesic: Secondary | ICD-10-CM | POA: Diagnosis not present

## 2016-03-18 DIAGNOSIS — E1142 Type 2 diabetes mellitus with diabetic polyneuropathy: Secondary | ICD-10-CM | POA: Diagnosis not present

## 2016-03-18 DIAGNOSIS — I2510 Atherosclerotic heart disease of native coronary artery without angina pectoris: Secondary | ICD-10-CM | POA: Diagnosis not present

## 2016-03-20 DIAGNOSIS — D649 Anemia, unspecified: Secondary | ICD-10-CM | POA: Diagnosis not present

## 2016-03-20 DIAGNOSIS — N189 Chronic kidney disease, unspecified: Secondary | ICD-10-CM | POA: Diagnosis not present

## 2016-03-20 DIAGNOSIS — Z794 Long term (current) use of insulin: Secondary | ICD-10-CM | POA: Diagnosis not present

## 2016-03-20 DIAGNOSIS — I2510 Atherosclerotic heart disease of native coronary artery without angina pectoris: Secondary | ICD-10-CM | POA: Diagnosis not present

## 2016-03-20 DIAGNOSIS — I429 Cardiomyopathy, unspecified: Secondary | ICD-10-CM | POA: Diagnosis not present

## 2016-03-20 DIAGNOSIS — E1122 Type 2 diabetes mellitus with diabetic chronic kidney disease: Secondary | ICD-10-CM | POA: Diagnosis not present

## 2016-03-20 DIAGNOSIS — Z79891 Long term (current) use of opiate analgesic: Secondary | ICD-10-CM | POA: Diagnosis not present

## 2016-03-20 DIAGNOSIS — I509 Heart failure, unspecified: Secondary | ICD-10-CM | POA: Diagnosis not present

## 2016-03-20 DIAGNOSIS — I130 Hypertensive heart and chronic kidney disease with heart failure and stage 1 through stage 4 chronic kidney disease, or unspecified chronic kidney disease: Secondary | ICD-10-CM | POA: Diagnosis not present

## 2016-03-20 DIAGNOSIS — E1165 Type 2 diabetes mellitus with hyperglycemia: Secondary | ICD-10-CM | POA: Diagnosis not present

## 2016-03-20 DIAGNOSIS — I252 Old myocardial infarction: Secondary | ICD-10-CM | POA: Diagnosis not present

## 2016-03-20 DIAGNOSIS — E785 Hyperlipidemia, unspecified: Secondary | ICD-10-CM | POA: Diagnosis not present

## 2016-03-20 DIAGNOSIS — Z950 Presence of cardiac pacemaker: Secondary | ICD-10-CM | POA: Diagnosis not present

## 2016-03-20 DIAGNOSIS — Z7901 Long term (current) use of anticoagulants: Secondary | ICD-10-CM | POA: Diagnosis not present

## 2016-03-20 DIAGNOSIS — E1142 Type 2 diabetes mellitus with diabetic polyneuropathy: Secondary | ICD-10-CM | POA: Diagnosis not present

## 2016-03-20 DIAGNOSIS — G4733 Obstructive sleep apnea (adult) (pediatric): Secondary | ICD-10-CM | POA: Diagnosis not present

## 2016-03-23 DIAGNOSIS — I2510 Atherosclerotic heart disease of native coronary artery without angina pectoris: Secondary | ICD-10-CM | POA: Diagnosis not present

## 2016-03-23 DIAGNOSIS — N189 Chronic kidney disease, unspecified: Secondary | ICD-10-CM | POA: Diagnosis not present

## 2016-03-23 DIAGNOSIS — Z7901 Long term (current) use of anticoagulants: Secondary | ICD-10-CM | POA: Diagnosis not present

## 2016-03-23 DIAGNOSIS — Z950 Presence of cardiac pacemaker: Secondary | ICD-10-CM | POA: Diagnosis not present

## 2016-03-23 DIAGNOSIS — G4733 Obstructive sleep apnea (adult) (pediatric): Secondary | ICD-10-CM | POA: Diagnosis not present

## 2016-03-23 DIAGNOSIS — Z79891 Long term (current) use of opiate analgesic: Secondary | ICD-10-CM | POA: Diagnosis not present

## 2016-03-23 DIAGNOSIS — I429 Cardiomyopathy, unspecified: Secondary | ICD-10-CM | POA: Diagnosis not present

## 2016-03-23 DIAGNOSIS — I252 Old myocardial infarction: Secondary | ICD-10-CM | POA: Diagnosis not present

## 2016-03-23 DIAGNOSIS — D649 Anemia, unspecified: Secondary | ICD-10-CM | POA: Diagnosis not present

## 2016-03-23 DIAGNOSIS — E1142 Type 2 diabetes mellitus with diabetic polyneuropathy: Secondary | ICD-10-CM | POA: Diagnosis not present

## 2016-03-23 DIAGNOSIS — Z794 Long term (current) use of insulin: Secondary | ICD-10-CM | POA: Diagnosis not present

## 2016-03-23 DIAGNOSIS — E785 Hyperlipidemia, unspecified: Secondary | ICD-10-CM | POA: Diagnosis not present

## 2016-03-23 DIAGNOSIS — I509 Heart failure, unspecified: Secondary | ICD-10-CM | POA: Diagnosis not present

## 2016-03-23 DIAGNOSIS — E1165 Type 2 diabetes mellitus with hyperglycemia: Secondary | ICD-10-CM | POA: Diagnosis not present

## 2016-03-23 DIAGNOSIS — I130 Hypertensive heart and chronic kidney disease with heart failure and stage 1 through stage 4 chronic kidney disease, or unspecified chronic kidney disease: Secondary | ICD-10-CM | POA: Diagnosis not present

## 2016-03-23 DIAGNOSIS — E1122 Type 2 diabetes mellitus with diabetic chronic kidney disease: Secondary | ICD-10-CM | POA: Diagnosis not present

## 2016-03-24 DIAGNOSIS — I509 Heart failure, unspecified: Secondary | ICD-10-CM | POA: Diagnosis not present

## 2016-03-24 DIAGNOSIS — E118 Type 2 diabetes mellitus with unspecified complications: Secondary | ICD-10-CM | POA: Diagnosis not present

## 2016-03-25 DIAGNOSIS — E785 Hyperlipidemia, unspecified: Secondary | ICD-10-CM | POA: Diagnosis not present

## 2016-03-25 DIAGNOSIS — I130 Hypertensive heart and chronic kidney disease with heart failure and stage 1 through stage 4 chronic kidney disease, or unspecified chronic kidney disease: Secondary | ICD-10-CM | POA: Diagnosis not present

## 2016-03-25 DIAGNOSIS — E1165 Type 2 diabetes mellitus with hyperglycemia: Secondary | ICD-10-CM | POA: Diagnosis not present

## 2016-03-25 DIAGNOSIS — E1142 Type 2 diabetes mellitus with diabetic polyneuropathy: Secondary | ICD-10-CM | POA: Diagnosis not present

## 2016-03-25 DIAGNOSIS — Z794 Long term (current) use of insulin: Secondary | ICD-10-CM | POA: Diagnosis not present

## 2016-03-25 DIAGNOSIS — D649 Anemia, unspecified: Secondary | ICD-10-CM | POA: Diagnosis not present

## 2016-03-25 DIAGNOSIS — Z7901 Long term (current) use of anticoagulants: Secondary | ICD-10-CM | POA: Diagnosis not present

## 2016-03-25 DIAGNOSIS — I2510 Atherosclerotic heart disease of native coronary artery without angina pectoris: Secondary | ICD-10-CM | POA: Diagnosis not present

## 2016-03-25 DIAGNOSIS — E1122 Type 2 diabetes mellitus with diabetic chronic kidney disease: Secondary | ICD-10-CM | POA: Diagnosis not present

## 2016-03-25 DIAGNOSIS — I509 Heart failure, unspecified: Secondary | ICD-10-CM | POA: Diagnosis not present

## 2016-03-25 DIAGNOSIS — I429 Cardiomyopathy, unspecified: Secondary | ICD-10-CM | POA: Diagnosis not present

## 2016-03-25 DIAGNOSIS — Z79891 Long term (current) use of opiate analgesic: Secondary | ICD-10-CM | POA: Diagnosis not present

## 2016-03-25 DIAGNOSIS — G4733 Obstructive sleep apnea (adult) (pediatric): Secondary | ICD-10-CM | POA: Diagnosis not present

## 2016-03-25 DIAGNOSIS — N189 Chronic kidney disease, unspecified: Secondary | ICD-10-CM | POA: Diagnosis not present

## 2016-03-25 DIAGNOSIS — Z950 Presence of cardiac pacemaker: Secondary | ICD-10-CM | POA: Diagnosis not present

## 2016-03-25 DIAGNOSIS — I252 Old myocardial infarction: Secondary | ICD-10-CM | POA: Diagnosis not present

## 2016-04-02 DIAGNOSIS — E1165 Type 2 diabetes mellitus with hyperglycemia: Secondary | ICD-10-CM | POA: Diagnosis not present

## 2016-04-02 DIAGNOSIS — I130 Hypertensive heart and chronic kidney disease with heart failure and stage 1 through stage 4 chronic kidney disease, or unspecified chronic kidney disease: Secondary | ICD-10-CM | POA: Diagnosis not present

## 2016-04-02 DIAGNOSIS — I429 Cardiomyopathy, unspecified: Secondary | ICD-10-CM | POA: Diagnosis not present

## 2016-04-02 DIAGNOSIS — I252 Old myocardial infarction: Secondary | ICD-10-CM | POA: Diagnosis not present

## 2016-04-02 DIAGNOSIS — Z794 Long term (current) use of insulin: Secondary | ICD-10-CM | POA: Diagnosis not present

## 2016-04-02 DIAGNOSIS — D649 Anemia, unspecified: Secondary | ICD-10-CM | POA: Diagnosis not present

## 2016-04-02 DIAGNOSIS — I509 Heart failure, unspecified: Secondary | ICD-10-CM | POA: Diagnosis not present

## 2016-04-02 DIAGNOSIS — Z79891 Long term (current) use of opiate analgesic: Secondary | ICD-10-CM | POA: Diagnosis not present

## 2016-04-02 DIAGNOSIS — E785 Hyperlipidemia, unspecified: Secondary | ICD-10-CM | POA: Diagnosis not present

## 2016-04-02 DIAGNOSIS — E1142 Type 2 diabetes mellitus with diabetic polyneuropathy: Secondary | ICD-10-CM | POA: Diagnosis not present

## 2016-04-02 DIAGNOSIS — Z7901 Long term (current) use of anticoagulants: Secondary | ICD-10-CM | POA: Diagnosis not present

## 2016-04-02 DIAGNOSIS — I2510 Atherosclerotic heart disease of native coronary artery without angina pectoris: Secondary | ICD-10-CM | POA: Diagnosis not present

## 2016-04-02 DIAGNOSIS — E1122 Type 2 diabetes mellitus with diabetic chronic kidney disease: Secondary | ICD-10-CM | POA: Diagnosis not present

## 2016-04-02 DIAGNOSIS — G4733 Obstructive sleep apnea (adult) (pediatric): Secondary | ICD-10-CM | POA: Diagnosis not present

## 2016-04-02 DIAGNOSIS — Z950 Presence of cardiac pacemaker: Secondary | ICD-10-CM | POA: Diagnosis not present

## 2016-04-02 DIAGNOSIS — N189 Chronic kidney disease, unspecified: Secondary | ICD-10-CM | POA: Diagnosis not present

## 2016-04-08 DIAGNOSIS — I429 Cardiomyopathy, unspecified: Secondary | ICD-10-CM | POA: Diagnosis not present

## 2016-04-08 DIAGNOSIS — I2510 Atherosclerotic heart disease of native coronary artery without angina pectoris: Secondary | ICD-10-CM | POA: Diagnosis not present

## 2016-04-08 DIAGNOSIS — E1165 Type 2 diabetes mellitus with hyperglycemia: Secondary | ICD-10-CM | POA: Diagnosis not present

## 2016-04-08 DIAGNOSIS — E785 Hyperlipidemia, unspecified: Secondary | ICD-10-CM | POA: Diagnosis not present

## 2016-04-08 DIAGNOSIS — D649 Anemia, unspecified: Secondary | ICD-10-CM | POA: Diagnosis not present

## 2016-04-08 DIAGNOSIS — I130 Hypertensive heart and chronic kidney disease with heart failure and stage 1 through stage 4 chronic kidney disease, or unspecified chronic kidney disease: Secondary | ICD-10-CM | POA: Diagnosis not present

## 2016-04-08 DIAGNOSIS — N189 Chronic kidney disease, unspecified: Secondary | ICD-10-CM | POA: Diagnosis not present

## 2016-04-08 DIAGNOSIS — G4733 Obstructive sleep apnea (adult) (pediatric): Secondary | ICD-10-CM | POA: Diagnosis not present

## 2016-04-08 DIAGNOSIS — E1122 Type 2 diabetes mellitus with diabetic chronic kidney disease: Secondary | ICD-10-CM | POA: Diagnosis not present

## 2016-04-08 DIAGNOSIS — I252 Old myocardial infarction: Secondary | ICD-10-CM | POA: Diagnosis not present

## 2016-04-08 DIAGNOSIS — Z950 Presence of cardiac pacemaker: Secondary | ICD-10-CM | POA: Diagnosis not present

## 2016-04-08 DIAGNOSIS — Z79891 Long term (current) use of opiate analgesic: Secondary | ICD-10-CM | POA: Diagnosis not present

## 2016-04-08 DIAGNOSIS — I509 Heart failure, unspecified: Secondary | ICD-10-CM | POA: Diagnosis not present

## 2016-04-08 DIAGNOSIS — Z7901 Long term (current) use of anticoagulants: Secondary | ICD-10-CM | POA: Diagnosis not present

## 2016-04-08 DIAGNOSIS — E1142 Type 2 diabetes mellitus with diabetic polyneuropathy: Secondary | ICD-10-CM | POA: Diagnosis not present

## 2016-04-08 DIAGNOSIS — Z794 Long term (current) use of insulin: Secondary | ICD-10-CM | POA: Diagnosis not present

## 2016-04-14 DIAGNOSIS — G4733 Obstructive sleep apnea (adult) (pediatric): Secondary | ICD-10-CM | POA: Diagnosis not present

## 2016-04-14 DIAGNOSIS — I429 Cardiomyopathy, unspecified: Secondary | ICD-10-CM | POA: Diagnosis not present

## 2016-04-14 DIAGNOSIS — Z7901 Long term (current) use of anticoagulants: Secondary | ICD-10-CM | POA: Diagnosis not present

## 2016-04-14 DIAGNOSIS — I130 Hypertensive heart and chronic kidney disease with heart failure and stage 1 through stage 4 chronic kidney disease, or unspecified chronic kidney disease: Secondary | ICD-10-CM | POA: Diagnosis not present

## 2016-04-14 DIAGNOSIS — Z79891 Long term (current) use of opiate analgesic: Secondary | ICD-10-CM | POA: Diagnosis not present

## 2016-04-14 DIAGNOSIS — E1165 Type 2 diabetes mellitus with hyperglycemia: Secondary | ICD-10-CM | POA: Diagnosis not present

## 2016-04-14 DIAGNOSIS — E785 Hyperlipidemia, unspecified: Secondary | ICD-10-CM | POA: Diagnosis not present

## 2016-04-14 DIAGNOSIS — Z950 Presence of cardiac pacemaker: Secondary | ICD-10-CM | POA: Diagnosis not present

## 2016-04-14 DIAGNOSIS — I509 Heart failure, unspecified: Secondary | ICD-10-CM | POA: Diagnosis not present

## 2016-04-14 DIAGNOSIS — I2510 Atherosclerotic heart disease of native coronary artery without angina pectoris: Secondary | ICD-10-CM | POA: Diagnosis not present

## 2016-04-14 DIAGNOSIS — N189 Chronic kidney disease, unspecified: Secondary | ICD-10-CM | POA: Diagnosis not present

## 2016-04-14 DIAGNOSIS — I252 Old myocardial infarction: Secondary | ICD-10-CM | POA: Diagnosis not present

## 2016-04-14 DIAGNOSIS — E1142 Type 2 diabetes mellitus with diabetic polyneuropathy: Secondary | ICD-10-CM | POA: Diagnosis not present

## 2016-04-14 DIAGNOSIS — E1122 Type 2 diabetes mellitus with diabetic chronic kidney disease: Secondary | ICD-10-CM | POA: Diagnosis not present

## 2016-04-14 DIAGNOSIS — Z794 Long term (current) use of insulin: Secondary | ICD-10-CM | POA: Diagnosis not present

## 2016-04-14 DIAGNOSIS — D649 Anemia, unspecified: Secondary | ICD-10-CM | POA: Diagnosis not present

## 2016-04-21 DIAGNOSIS — C61 Malignant neoplasm of prostate: Secondary | ICD-10-CM | POA: Diagnosis not present

## 2016-04-21 DIAGNOSIS — I5023 Acute on chronic systolic (congestive) heart failure: Secondary | ICD-10-CM | POA: Diagnosis not present

## 2016-04-21 DIAGNOSIS — I10 Essential (primary) hypertension: Secondary | ICD-10-CM | POA: Diagnosis not present

## 2016-04-21 DIAGNOSIS — R0602 Shortness of breath: Secondary | ICD-10-CM | POA: Diagnosis not present

## 2016-04-21 DIAGNOSIS — E118 Type 2 diabetes mellitus with unspecified complications: Secondary | ICD-10-CM | POA: Diagnosis not present

## 2016-04-28 DIAGNOSIS — E118 Type 2 diabetes mellitus with unspecified complications: Secondary | ICD-10-CM | POA: Diagnosis not present

## 2016-04-28 DIAGNOSIS — I5020 Unspecified systolic (congestive) heart failure: Secondary | ICD-10-CM | POA: Diagnosis not present

## 2016-04-28 DIAGNOSIS — I10 Essential (primary) hypertension: Secondary | ICD-10-CM | POA: Diagnosis not present

## 2016-05-06 DIAGNOSIS — I5022 Chronic systolic (congestive) heart failure: Secondary | ICD-10-CM | POA: Diagnosis not present

## 2016-05-06 DIAGNOSIS — Z95810 Presence of automatic (implantable) cardiac defibrillator: Secondary | ICD-10-CM | POA: Diagnosis not present

## 2016-05-11 DIAGNOSIS — I509 Heart failure, unspecified: Secondary | ICD-10-CM | POA: Diagnosis not present

## 2016-05-11 DIAGNOSIS — E118 Type 2 diabetes mellitus with unspecified complications: Secondary | ICD-10-CM | POA: Diagnosis not present

## 2016-05-11 DIAGNOSIS — I255 Ischemic cardiomyopathy: Secondary | ICD-10-CM | POA: Diagnosis not present

## 2016-05-11 DIAGNOSIS — I472 Ventricular tachycardia: Secondary | ICD-10-CM | POA: Diagnosis not present

## 2016-05-11 DIAGNOSIS — D591 Other autoimmune hemolytic anemias: Secondary | ICD-10-CM | POA: Diagnosis not present

## 2016-05-18 DIAGNOSIS — E118 Type 2 diabetes mellitus with unspecified complications: Secondary | ICD-10-CM | POA: Diagnosis not present

## 2016-05-18 DIAGNOSIS — I509 Heart failure, unspecified: Secondary | ICD-10-CM | POA: Diagnosis not present

## 2016-05-21 DIAGNOSIS — I5020 Unspecified systolic (congestive) heart failure: Secondary | ICD-10-CM | POA: Diagnosis not present

## 2016-05-21 DIAGNOSIS — I10 Essential (primary) hypertension: Secondary | ICD-10-CM | POA: Diagnosis not present

## 2016-05-21 DIAGNOSIS — E118 Type 2 diabetes mellitus with unspecified complications: Secondary | ICD-10-CM | POA: Diagnosis not present

## 2016-05-25 DIAGNOSIS — E118 Type 2 diabetes mellitus with unspecified complications: Secondary | ICD-10-CM | POA: Diagnosis not present

## 2016-05-25 DIAGNOSIS — I509 Heart failure, unspecified: Secondary | ICD-10-CM | POA: Diagnosis not present

## 2016-05-28 DIAGNOSIS — M25561 Pain in right knee: Secondary | ICD-10-CM | POA: Diagnosis not present

## 2016-05-28 DIAGNOSIS — M25562 Pain in left knee: Secondary | ICD-10-CM | POA: Diagnosis not present

## 2016-05-28 DIAGNOSIS — M5441 Lumbago with sciatica, right side: Secondary | ICD-10-CM | POA: Diagnosis not present

## 2016-05-28 DIAGNOSIS — G8929 Other chronic pain: Secondary | ICD-10-CM | POA: Diagnosis not present

## 2016-06-04 DIAGNOSIS — Z4502 Encounter for adjustment and management of automatic implantable cardiac defibrillator: Secondary | ICD-10-CM | POA: Diagnosis not present

## 2016-06-04 DIAGNOSIS — E1143 Type 2 diabetes mellitus with diabetic autonomic (poly)neuropathy: Secondary | ICD-10-CM | POA: Diagnosis not present

## 2016-06-04 DIAGNOSIS — Z5181 Encounter for therapeutic drug level monitoring: Secondary | ICD-10-CM | POA: Diagnosis not present

## 2016-06-04 DIAGNOSIS — Z95810 Presence of automatic (implantable) cardiac defibrillator: Secondary | ICD-10-CM | POA: Diagnosis not present

## 2016-06-08 DIAGNOSIS — E118 Type 2 diabetes mellitus with unspecified complications: Secondary | ICD-10-CM | POA: Diagnosis not present

## 2016-06-08 DIAGNOSIS — I509 Heart failure, unspecified: Secondary | ICD-10-CM | POA: Diagnosis not present

## 2016-06-17 DIAGNOSIS — Z5181 Encounter for therapeutic drug level monitoring: Secondary | ICD-10-CM | POA: Diagnosis not present

## 2016-06-17 DIAGNOSIS — Z79899 Other long term (current) drug therapy: Secondary | ICD-10-CM | POA: Diagnosis not present

## 2016-06-29 DIAGNOSIS — E118 Type 2 diabetes mellitus with unspecified complications: Secondary | ICD-10-CM | POA: Diagnosis not present

## 2016-07-02 DIAGNOSIS — N289 Disorder of kidney and ureter, unspecified: Secondary | ICD-10-CM | POA: Diagnosis not present

## 2016-07-06 DIAGNOSIS — I509 Heart failure, unspecified: Secondary | ICD-10-CM | POA: Diagnosis not present

## 2016-07-06 DIAGNOSIS — E118 Type 2 diabetes mellitus with unspecified complications: Secondary | ICD-10-CM | POA: Diagnosis not present

## 2016-07-08 DIAGNOSIS — E118 Type 2 diabetes mellitus with unspecified complications: Secondary | ICD-10-CM | POA: Diagnosis not present

## 2016-07-08 DIAGNOSIS — I509 Heart failure, unspecified: Secondary | ICD-10-CM | POA: Diagnosis not present

## 2016-07-08 DIAGNOSIS — E1142 Type 2 diabetes mellitus with diabetic polyneuropathy: Secondary | ICD-10-CM | POA: Diagnosis not present

## 2016-07-27 DIAGNOSIS — E118 Type 2 diabetes mellitus with unspecified complications: Secondary | ICD-10-CM | POA: Diagnosis not present

## 2016-07-28 DIAGNOSIS — I5022 Chronic systolic (congestive) heart failure: Secondary | ICD-10-CM | POA: Diagnosis not present

## 2016-07-28 DIAGNOSIS — I255 Ischemic cardiomyopathy: Secondary | ICD-10-CM | POA: Diagnosis not present

## 2016-07-28 DIAGNOSIS — Z95810 Presence of automatic (implantable) cardiac defibrillator: Secondary | ICD-10-CM | POA: Diagnosis not present

## 2016-07-28 DIAGNOSIS — I2510 Atherosclerotic heart disease of native coronary artery without angina pectoris: Secondary | ICD-10-CM | POA: Diagnosis not present

## 2016-08-03 DIAGNOSIS — E118 Type 2 diabetes mellitus with unspecified complications: Secondary | ICD-10-CM | POA: Diagnosis not present

## 2016-08-03 DIAGNOSIS — I509 Heart failure, unspecified: Secondary | ICD-10-CM | POA: Diagnosis not present

## 2016-08-05 DIAGNOSIS — Z95810 Presence of automatic (implantable) cardiac defibrillator: Secondary | ICD-10-CM | POA: Diagnosis not present

## 2016-08-05 DIAGNOSIS — I509 Heart failure, unspecified: Secondary | ICD-10-CM | POA: Diagnosis not present

## 2016-08-10 DIAGNOSIS — E118 Type 2 diabetes mellitus with unspecified complications: Secondary | ICD-10-CM | POA: Diagnosis not present

## 2016-08-10 DIAGNOSIS — E1142 Type 2 diabetes mellitus with diabetic polyneuropathy: Secondary | ICD-10-CM | POA: Diagnosis not present

## 2016-08-10 DIAGNOSIS — I5022 Chronic systolic (congestive) heart failure: Secondary | ICD-10-CM | POA: Diagnosis not present

## 2016-08-10 DIAGNOSIS — I10 Essential (primary) hypertension: Secondary | ICD-10-CM | POA: Diagnosis not present

## 2016-09-28 DIAGNOSIS — E118 Type 2 diabetes mellitus with unspecified complications: Secondary | ICD-10-CM | POA: Diagnosis not present

## 2016-09-28 DIAGNOSIS — I509 Heart failure, unspecified: Secondary | ICD-10-CM | POA: Diagnosis not present

## 2016-10-28 DIAGNOSIS — M5136 Other intervertebral disc degeneration, lumbar region: Secondary | ICD-10-CM | POA: Insufficient documentation

## 2016-10-28 DIAGNOSIS — M542 Cervicalgia: Secondary | ICD-10-CM | POA: Insufficient documentation

## 2016-10-28 DIAGNOSIS — M5481 Occipital neuralgia: Secondary | ICD-10-CM | POA: Insufficient documentation

## 2016-10-28 DIAGNOSIS — Z95810 Presence of automatic (implantable) cardiac defibrillator: Secondary | ICD-10-CM | POA: Diagnosis not present

## 2016-10-28 DIAGNOSIS — M25562 Pain in left knee: Secondary | ICD-10-CM | POA: Insufficient documentation

## 2016-10-28 DIAGNOSIS — N393 Stress incontinence (female) (male): Secondary | ICD-10-CM | POA: Insufficient documentation

## 2016-10-28 DIAGNOSIS — G629 Polyneuropathy, unspecified: Secondary | ICD-10-CM | POA: Insufficient documentation

## 2016-10-28 DIAGNOSIS — F419 Anxiety disorder, unspecified: Secondary | ICD-10-CM | POA: Insufficient documentation

## 2016-10-28 DIAGNOSIS — T82110A Breakdown (mechanical) of cardiac electrode, initial encounter: Secondary | ICD-10-CM | POA: Diagnosis not present

## 2016-10-28 DIAGNOSIS — I5042 Chronic combined systolic (congestive) and diastolic (congestive) heart failure: Secondary | ICD-10-CM | POA: Diagnosis not present

## 2016-10-28 DIAGNOSIS — M791 Myalgia, unspecified site: Secondary | ICD-10-CM | POA: Insufficient documentation

## 2016-10-28 DIAGNOSIS — I255 Ischemic cardiomyopathy: Secondary | ICD-10-CM | POA: Diagnosis not present

## 2016-10-28 DIAGNOSIS — R001 Bradycardia, unspecified: Secondary | ICD-10-CM | POA: Insufficient documentation

## 2016-10-28 DIAGNOSIS — I2510 Atherosclerotic heart disease of native coronary artery without angina pectoris: Secondary | ICD-10-CM | POA: Diagnosis not present

## 2016-10-28 DIAGNOSIS — M1990 Unspecified osteoarthritis, unspecified site: Secondary | ICD-10-CM | POA: Insufficient documentation

## 2016-10-28 DIAGNOSIS — I499 Cardiac arrhythmia, unspecified: Secondary | ICD-10-CM | POA: Insufficient documentation

## 2016-11-05 DIAGNOSIS — C61 Malignant neoplasm of prostate: Secondary | ICD-10-CM | POA: Diagnosis not present

## 2016-11-05 DIAGNOSIS — I10 Essential (primary) hypertension: Secondary | ICD-10-CM | POA: Diagnosis not present

## 2016-11-05 DIAGNOSIS — E118 Type 2 diabetes mellitus with unspecified complications: Secondary | ICD-10-CM | POA: Diagnosis not present

## 2016-11-05 DIAGNOSIS — D649 Anemia, unspecified: Secondary | ICD-10-CM | POA: Diagnosis not present

## 2016-11-10 DIAGNOSIS — E1141 Type 2 diabetes mellitus with diabetic mononeuropathy: Secondary | ICD-10-CM | POA: Diagnosis not present

## 2016-11-10 DIAGNOSIS — I10 Essential (primary) hypertension: Secondary | ICD-10-CM | POA: Diagnosis not present

## 2016-11-10 DIAGNOSIS — M25512 Pain in left shoulder: Secondary | ICD-10-CM | POA: Diagnosis not present

## 2016-11-10 DIAGNOSIS — E118 Type 2 diabetes mellitus with unspecified complications: Secondary | ICD-10-CM | POA: Diagnosis not present

## 2016-11-11 DIAGNOSIS — Z01818 Encounter for other preprocedural examination: Secondary | ICD-10-CM | POA: Diagnosis not present

## 2016-11-11 DIAGNOSIS — I517 Cardiomegaly: Secondary | ICD-10-CM | POA: Diagnosis not present

## 2016-11-11 DIAGNOSIS — Z950 Presence of cardiac pacemaker: Secondary | ICD-10-CM | POA: Diagnosis not present

## 2016-11-17 DIAGNOSIS — E1143 Type 2 diabetes mellitus with diabetic autonomic (poly)neuropathy: Secondary | ICD-10-CM | POA: Diagnosis not present

## 2016-11-17 DIAGNOSIS — I5042 Chronic combined systolic (congestive) and diastolic (congestive) heart failure: Secondary | ICD-10-CM | POA: Diagnosis not present

## 2016-11-17 DIAGNOSIS — T82110A Breakdown (mechanical) of cardiac electrode, initial encounter: Secondary | ICD-10-CM | POA: Diagnosis not present

## 2016-11-17 DIAGNOSIS — I429 Cardiomyopathy, unspecified: Secondary | ICD-10-CM | POA: Diagnosis not present

## 2016-11-17 DIAGNOSIS — T82110D Breakdown (mechanical) of cardiac electrode, subsequent encounter: Secondary | ICD-10-CM | POA: Diagnosis not present

## 2016-11-17 DIAGNOSIS — Z452 Encounter for adjustment and management of vascular access device: Secondary | ICD-10-CM | POA: Diagnosis not present

## 2016-11-17 DIAGNOSIS — K3184 Gastroparesis: Secondary | ICD-10-CM | POA: Diagnosis not present

## 2016-11-17 DIAGNOSIS — Z794 Long term (current) use of insulin: Secondary | ICD-10-CM | POA: Diagnosis not present

## 2016-11-17 DIAGNOSIS — I509 Heart failure, unspecified: Secondary | ICD-10-CM | POA: Diagnosis not present

## 2016-11-18 DIAGNOSIS — E1143 Type 2 diabetes mellitus with diabetic autonomic (poly)neuropathy: Secondary | ICD-10-CM | POA: Diagnosis not present

## 2016-11-18 DIAGNOSIS — T82110A Breakdown (mechanical) of cardiac electrode, initial encounter: Secondary | ICD-10-CM | POA: Diagnosis not present

## 2016-11-18 DIAGNOSIS — I509 Heart failure, unspecified: Secondary | ICD-10-CM | POA: Diagnosis not present

## 2016-11-18 DIAGNOSIS — K3184 Gastroparesis: Secondary | ICD-10-CM | POA: Diagnosis not present

## 2016-11-18 DIAGNOSIS — Z794 Long term (current) use of insulin: Secondary | ICD-10-CM | POA: Diagnosis not present

## 2016-11-18 DIAGNOSIS — I429 Cardiomyopathy, unspecified: Secondary | ICD-10-CM | POA: Diagnosis not present

## 2016-11-23 DIAGNOSIS — N390 Urinary tract infection, site not specified: Secondary | ICD-10-CM | POA: Diagnosis not present

## 2016-11-26 DIAGNOSIS — E785 Hyperlipidemia, unspecified: Secondary | ICD-10-CM | POA: Diagnosis not present

## 2016-11-26 DIAGNOSIS — I10 Essential (primary) hypertension: Secondary | ICD-10-CM | POA: Diagnosis not present

## 2016-11-26 DIAGNOSIS — E118 Type 2 diabetes mellitus with unspecified complications: Secondary | ICD-10-CM | POA: Diagnosis not present

## 2016-12-07 DIAGNOSIS — Z7982 Long term (current) use of aspirin: Secondary | ICD-10-CM | POA: Diagnosis not present

## 2016-12-07 DIAGNOSIS — I255 Ischemic cardiomyopathy: Secondary | ICD-10-CM | POA: Diagnosis not present

## 2016-12-07 DIAGNOSIS — E876 Hypokalemia: Secondary | ICD-10-CM | POA: Diagnosis not present

## 2016-12-07 DIAGNOSIS — I429 Cardiomyopathy, unspecified: Secondary | ICD-10-CM | POA: Diagnosis not present

## 2016-12-07 DIAGNOSIS — D649 Anemia, unspecified: Secondary | ICD-10-CM | POA: Diagnosis not present

## 2016-12-07 DIAGNOSIS — R5383 Other fatigue: Secondary | ICD-10-CM | POA: Diagnosis not present

## 2016-12-07 DIAGNOSIS — Z794 Long term (current) use of insulin: Secondary | ICD-10-CM | POA: Diagnosis not present

## 2016-12-07 DIAGNOSIS — D591 Other autoimmune hemolytic anemias: Secondary | ICD-10-CM | POA: Diagnosis not present

## 2016-12-07 DIAGNOSIS — Z95810 Presence of automatic (implantable) cardiac defibrillator: Secondary | ICD-10-CM | POA: Diagnosis not present

## 2016-12-07 DIAGNOSIS — E785 Hyperlipidemia, unspecified: Secondary | ICD-10-CM | POA: Diagnosis not present

## 2016-12-07 DIAGNOSIS — E1140 Type 2 diabetes mellitus with diabetic neuropathy, unspecified: Secondary | ICD-10-CM | POA: Diagnosis not present

## 2016-12-07 DIAGNOSIS — R6889 Other general symptoms and signs: Secondary | ICD-10-CM | POA: Diagnosis not present

## 2016-12-07 DIAGNOSIS — I5022 Chronic systolic (congestive) heart failure: Secondary | ICD-10-CM | POA: Diagnosis not present

## 2016-12-07 DIAGNOSIS — M1990 Unspecified osteoarthritis, unspecified site: Secondary | ICD-10-CM | POA: Diagnosis not present

## 2016-12-22 ENCOUNTER — Other Ambulatory Visit: Payer: Self-pay

## 2016-12-23 ENCOUNTER — Other Ambulatory Visit: Payer: Self-pay | Admitting: *Deleted

## 2016-12-23 NOTE — Patient Outreach (Signed)
[facility]) Care Management  12/23/2016  Khristian Phillippi Apr 08, 1945 [id number]   Telephone Screen  Referral Date: 12/22/16 Referral Source: EMMI Prevent Referral Reason: COPD, DM, Heart Disease Insurance: [place] Medicare   Outreach attempt #1 to patient. Spouse answered telephone call. RN CM left HIPAA compliant message along with contact info.    Plan: RN CM will contact patient within a month.   [name], RN, BSN, MHA/MSL, [place] Telephonic Care Manager Coordinator [facility] Direct Phone: [phone] Toll Free: [phone] Fax: [phone]

## 2016-12-24 NOTE — Patient Outreach (Signed)
[facility] Ocho[facility]) Care Management  12/24/2016  Johnny Ochoa [birth year] [id number]  Telephone Screen  Referral Date: 12/22/16 Referral Source: EMMI Prevent Referral Reason: COPD, DM, Heart Disease Insurance: [place] Medicare  Incoming telephone call from patient. HIPAA verified with patient. Patient confirmed having a history of COPD, DM, Osteoarthritis, Heart disease with pacemaker and defibrillator. He is insulin dependent. His BG readings range from 130 to 150, which he monitors daily. He has osteoarthritis and needs surgery. Patient is not a candidate for surgery due to his heart. He discussed how his ambulation is very limited to his knee being "bone on bone". He verbalized being short of breath with exertion, which limits his mobility. He described edema in his lower extremities. Patient stated, he stays at home majority of the time. He stated, his vision is poor and he needs cataract surgery. Patient discussed refusing cataract surgery at this time. Per patient, his Ophthalmologist plans to revisit option for surgery in 2019. Patient reported, he is depressed (score 17). He verbalized having a lack of desire to "do anything" due to his medical conditions. He visits with a Psychiatrist every six months. He is taking medications for depression, however he is considering stopping his medications. He feels the medicines causes some side effects. Patient reported, he takes 15 medications per day. [facility] services and benefits explained to patient. Patient agreed to services.  Plan: RN CM will send [facility] SW referral for possible assistance with depression, safety, and possible community resourses. RN CM advised patient to contact RNCM for any needs or concerns. RN CM will send [facility] pharmacy referral for polypharmacy.  Johnny Bells, RN, BSN, MHA/MSL, [place] Telephonic Care Manager Coordinator [facility] Direct Phone: [phone] Toll Free: [phone] Fax:  [phone]

## 2016-12-28 ENCOUNTER — Ambulatory Visit: Payer: Self-pay | Admitting: *Deleted

## 2016-12-28 ENCOUNTER — Other Ambulatory Visit: Payer: Self-pay | Admitting: Pharmacist

## 2016-12-28 NOTE — Patient Outreach (Signed)
[place] [facility]) Care Management  [place]   12/28/2016  Kristopher Attwood 1945/04/20 [id number]  Subjective: 71 year old male referred to [place] Management from [facility] call.  [place] services requested for a medication review.  PMHx includes, but not limited to, chronic bilateral low back pain, CAD s/p stents x3,  ischemic cardiomyopathy with LV dysfunction with ICD, OSA, morbid obesity, type 2 diabetes mellitus, anxiety, depression, asthma, prostate cancer, arthritis, and GERD.   Successful call to Mr. Fadel Clason on his mobile phone.  Mr. Rhinesmith reports today is not a good time to review his medications and requests a return call on Wednesday at 11:00 AM.    Plan: Call patient on November 14th at 11:00 AM  Ralene Bathe, PharmD, [place] [phone]

## 2016-12-29 DIAGNOSIS — Z0000 Encounter for general adult medical examination without abnormal findings: Secondary | ICD-10-CM | POA: Diagnosis not present

## 2016-12-30 ENCOUNTER — Other Ambulatory Visit: Payer: Self-pay | Admitting: Pharmacist

## 2016-12-30 ENCOUNTER — Ambulatory Visit: Payer: Self-pay | Admitting: Pharmacist

## 2016-12-30 NOTE — Patient Outreach (Signed)
[facility]) Care Management  12/30/2016  Johnny Ochoa 05/24/1945 [id number]  Unsuccessful telephone call to Johnny Ochoa today.  I left a HIPPA compliant voicemail on the home and mobile phone.    Plan: I will follow-up with Johnny Ochoa next week regarding his medications.  Ralene Bathe, PharmD, [place] [phone]

## 2017-01-04 ENCOUNTER — Ambulatory Visit: Payer: Self-pay | Admitting: Pharmacist

## 2017-01-04 ENCOUNTER — Other Ambulatory Visit: Payer: Self-pay | Admitting: Pharmacist

## 2017-01-04 NOTE — Patient Outreach (Addendum)
[facility]) Care Management  [place]   01/04/2017  Johnny Ochoa 1945/11/25 [id number]  Subjective: 71 year old male referred to [place] Management from [facility] call.  [place] services requested for a medication review.  PMHx includes, but not limited to, chronic bilateral low back pain, CAD s/p stents x3,  ischemic cardiomyopathy with LV dysfunction with ICD, OSA, morbid obesity, type 2 diabetes mellitus, anxiety, depression, asthma, prostate cancer, arthritis, and GERD.   Successful telephone call to Mr. Polinski today. HIPAA identifiers verified. Mr. Bochenek reports his PCP is Dr. Jani Gravel who is a [facility] provider.  He reports he is a disabled veteran and picks up medications from both the [place] in [place] and [facility].  He reports he weighs himself every day and that his EDW = 264 lb.   He reports no issues with affording his medications.  He reports no questions or concerns with his medications.  He is agreeable to a medication reconciliation telephonically.    Objective:  Hemoglobin A1c = 7.3 (11/05/16) SCr = 1.07 (07/27/2016) Potassium = 4 (07/27/2016)    Medications Reviewed Today    Reviewed by Rudean Haskell, RPH (Pharmacist) on 01/04/17 at 1451  Med List Status: <None>  Medication Order Taking? Sig Documenting Provider Last Dose Status Informant  acetaminophen (TYLENOL) 500 MG tablet [id number] Yes Take 1,000 mg 2 (two) times daily as needed by mouth (pain). [provider] Taking Active   albuterol (PROVENTIL HFA;VENTOLIN HFA) 108 (90 BASE) MCG/ACT inhaler [id number] Yes Inhale 2 puffs into the lungs every 4 (four) hours as needed for wheezing or shortness of breath. Orpah Greek, MD Taking Active Self  aspirin EC 81 MG tablet [id number] Yes Take 81 mg by mouth daily. [provider] Taking Active Self  atorvastatin (LIPITOR) 80 MG tablet [id number] Yes Take 40 mg daily by mouth.  [provider] Taking Active Self  clopidogrel  (PLAVIX) 75 MG tablet [id number] Yes Take 75 mg by mouth daily. [provider] Taking Active Self           Med Note Nash Mantis, TIFFANI S   Wed Feb 26, 2016  9:33 AM)    docusate sodium (COLACE) 100 MG capsule [id number] Yes Take 100 mg by mouth 2 (two) times daily. [provider] Taking Active Self  DULoxetine (CYMBALTA) 30 MG capsule [id number] Yes Take 30 mg by mouth daily. [provider] Taking Active Self  furosemide (LASIX) 40 MG tablet [id number] Yes Take 1 tablet (40 mg total) by mouth daily. Take an extra dose if you gain 2 lbs in 24 hours. Rama, Venetia Maxon, MD Taking Active   gabapentin (NEURONTIN) 400 MG capsule [id number] No Take 400 mg by mouth 4 (four) times daily. [provider] Not Taking Active Self  insulin aspart (NOVOLOG) 100 UNIT/ML injection [id number] Yes Inject 15-20 Units 3 (three) times daily with meals into the skin. Take 15 units with breakfast, 15 units with lunch, and 20 units with supper [provider] Taking Active Self  insulin glargine (LANTUS) 100 UNIT/ML injection [id number] Yes Inject 44 Units 2 (two) times daily into the skin. Take 44 units in the morning and 45 units in the evening [provider] Taking Active Self        Discontinued 01/04/17 1358 (Change in therapy)   isosorbide mononitrate (IMDUR) 30 MG 24 hr tablet [id number] Yes Take 30 mg daily by mouth. [provider] Taking Active  Discontinued 01/04/17 1425 (Change in therapy)   lurasidone (LATUDA) 40 MG TABS tablet [id number] Yes Take 20 mg daily with breakfast by mouth. [provider] Taking Active   metolazone (ZAROXOLYN) 5 MG tablet [id number] Yes Take 5 mg once a week by mouth. Takes on Sunday or Monday [provider] Taking Active         Discontinued 01/04/17 1426 (Change in therapy)   metoprolol succinate (TOPROL-XL) 100 MG 24 hr tablet [id number] Yes Take 50 mg daily by mouth. Take with or immediately following  a meal. [provider] Taking Active   Multiple Vitamin (MULTIVITAMIN) tablet [id number] Yes Take 1 tablet by mouth daily.   [provider] Taking Active Self  nitroGLYCERIN (NITROSTAT) 0.4 MG SL tablet [id number] Yes Place 0.4 mg every 5 (five) minutes as needed under the tongue.  [provider] Taking Active Self           Med Note Nash Mantis, TIFFANI S   Wed Feb 26, 2016  9:33 AM)    omeprazole (PRILOSEC) 20 MG capsule [id number] Yes Take 20 mg daily by mouth.  [provider] Taking Active Self  oxycodone (OXY-IR) 5 MG capsule [id number] Yes Take 5 mg every 6 (six) hours as needed by mouth for pain. [provider] Taking Active   polyethylene glycol [facility] / GLYCOLAX) packet [id number] Yes Take 17 g daily as needed by mouth.  [provider] Taking Active Self  primidone (MYSOLINE) 50 MG tablet [id number] Yes Take 2 (two) times daily by mouth. [provider] Taking Active         Discontinued 01/04/17 1406 (Discontinued by provider)            Med Note Nash Mantis, TIFFANI S   Wed Feb 26, 2016  9:33 AM)    sacubitril-valsartan (ENTRESTO) 49-51 MG [id number] Yes Take 1 tablet 2 (two) times daily by mouth. [provider] Taking Active         Discontinued 01/04/17 1406 (Discontinued by provider)            Med Note Nash Mantis, TIFFANI S   Wed Feb 26, 2016  9:33 AM)    spironolactone (ALDACTONE) 25 MG tablet [id number] Yes Take 25 mg by mouth daily. [provider] Taking Active Self           Med Note Nash Mantis, TIFFANI S   Wed Feb 26, 2016  9:34 AM)    tamsulosin (FLOMAX) 0.4 MG CAPS capsule [id number] Yes Take 0.8 mg by mouth at bedtime.  [provider] Taking Active Self  terbinafine (LAMISIL) 1 % cream [id number] Yes Apply 1 application topically daily as needed for itching. [provider] Taking Active Self           Med Note Nash Mantis, TIFFANI S   Wed Feb 26, 2016  9:34 AM)    traZODone (DESYREL) 100 MG  tablet [id number] Yes Take 100 mg by mouth at bedtime. [provider] Taking Active Self           Med Note Nash Mantis, TIFFANI Renold Genta Feb 26, 2016  9:34 AM)    Med List Note Burnett Harry, CPhT 02/26/16 6789): [facility]           Functional Status: No flowsheet data found.  Fall/Depression Screening: Fall Risk  12/23/2016 10/23/2015  Falls in the past year? Yes No  Comment - [place] Telephone Survey: data to providers prior to load  Number falls in past yr: 2  or more -  Injury with Fall? No -  Risk Factor Category  High Fall Risk -  Risk for fall due to : History of fall(s);Impaired balance/gait;Impaired mobility -  Follow up Falls evaluation completed -   PHQ 2/9 Scores 12/23/2016  PHQ - 2 Score 4  PHQ- 9 Score 17     Assessment:   Drugs sorted by system:  Neurologic/Psychologic: Duloxetine, gabapentin, lurasidone, trazodone, primidone  Cardiovascular: Aspirin, atorvastatin, clopidogrel, furosemide, isosorbide mononitrate, metolazone, metoprolol succinate, PRN NTG, sacubitril-valsartan, spironolactone  Pulmonary/Allergy: Albuterol  Gastrointestinal: docusate, polyethylene glycol, omeprazole  Endocrine: insulin aspart, insulin glargine  Topical: terbinafine  Pain: Acetaminophen, oxycodone  Vitamins/Minerals: MVI  Miscellaneous:tamsulosin  Medication issues noted:  Per patient, he is out of refills on his gabapentin.  His pharmacy has faxed Dr. Luetta Nutting for refills.    Patient reports he uses his albuterol multiple times a day every day. I reviewed with him that albuterol is a rescue inhaler and that he should discuss how often he is using it with his provider.  Mr. Wegner reports he has a visit with Dr. Maudie Mercury in December and he will talk to him about it at this appointment.    Spironolactone and sacubitril-valsartan: Monitor potassium closely and adjust as warranted.     Mr. Kochan denies needing any financial assistance with his medications and does not  have any questions or concerns regarding his medications.  I   Plan: I will route my note to Dr. Maudie Mercury regarding albuterol usage and also left a message at the office.   [facility] pharmacy will sign-off at this time but am happy to help in the future as needed.    I will alert [facility] LCSW of pharmacy case closure.    Ralene Bathe, PharmD, [place] [phone]

## 2017-01-21 DIAGNOSIS — M1712 Unilateral primary osteoarthritis, left knee: Secondary | ICD-10-CM | POA: Diagnosis not present

## 2017-01-21 DIAGNOSIS — M79605 Pain in left leg: Secondary | ICD-10-CM | POA: Diagnosis not present

## 2017-01-21 DIAGNOSIS — N390 Urinary tract infection, site not specified: Secondary | ICD-10-CM | POA: Diagnosis not present

## 2017-01-21 DIAGNOSIS — I10 Essential (primary) hypertension: Secondary | ICD-10-CM | POA: Diagnosis not present

## 2017-01-21 DIAGNOSIS — M8448XA Pathological fracture, other site, initial encounter for fracture: Secondary | ICD-10-CM | POA: Diagnosis not present

## 2017-01-21 DIAGNOSIS — C61 Malignant neoplasm of prostate: Secondary | ICD-10-CM | POA: Diagnosis not present

## 2017-01-21 DIAGNOSIS — E118 Type 2 diabetes mellitus with unspecified complications: Secondary | ICD-10-CM | POA: Diagnosis not present

## 2017-01-21 DIAGNOSIS — R739 Hyperglycemia, unspecified: Secondary | ICD-10-CM | POA: Diagnosis not present

## 2017-02-25 DIAGNOSIS — Z95810 Presence of automatic (implantable) cardiac defibrillator: Secondary | ICD-10-CM | POA: Diagnosis not present

## 2017-03-31 DIAGNOSIS — I255 Ischemic cardiomyopathy: Secondary | ICD-10-CM | POA: Diagnosis not present

## 2017-04-21 DIAGNOSIS — I509 Heart failure, unspecified: Secondary | ICD-10-CM | POA: Diagnosis not present

## 2017-05-06 DIAGNOSIS — E118 Type 2 diabetes mellitus with unspecified complications: Secondary | ICD-10-CM | POA: Diagnosis not present

## 2017-05-13 DIAGNOSIS — E118 Type 2 diabetes mellitus with unspecified complications: Secondary | ICD-10-CM | POA: Diagnosis not present

## 2017-05-13 DIAGNOSIS — I10 Essential (primary) hypertension: Secondary | ICD-10-CM | POA: Diagnosis not present

## 2017-06-15 DIAGNOSIS — D649 Anemia, unspecified: Secondary | ICD-10-CM | POA: Diagnosis not present

## 2017-06-15 DIAGNOSIS — D591 Other autoimmune hemolytic anemias: Secondary | ICD-10-CM | POA: Diagnosis not present

## 2017-06-15 DIAGNOSIS — Z862 Personal history of diseases of the blood and blood-forming organs and certain disorders involving the immune mechanism: Secondary | ICD-10-CM | POA: Diagnosis not present

## 2017-06-15 DIAGNOSIS — R5383 Other fatigue: Secondary | ICD-10-CM | POA: Diagnosis not present

## 2017-06-22 ENCOUNTER — Encounter (HOSPITAL_BASED_OUTPATIENT_CLINIC_OR_DEPARTMENT_OTHER): Payer: Self-pay | Admitting: *Deleted

## 2017-06-22 ENCOUNTER — Emergency Department (HOSPITAL_BASED_OUTPATIENT_CLINIC_OR_DEPARTMENT_OTHER): Payer: Medicare Other

## 2017-06-22 ENCOUNTER — Other Ambulatory Visit: Payer: Self-pay

## 2017-06-22 DIAGNOSIS — I509 Heart failure, unspecified: Secondary | ICD-10-CM | POA: Diagnosis not present

## 2017-06-22 DIAGNOSIS — Z87891 Personal history of nicotine dependence: Secondary | ICD-10-CM | POA: Diagnosis not present

## 2017-06-22 DIAGNOSIS — Z794 Long term (current) use of insulin: Secondary | ICD-10-CM | POA: Diagnosis not present

## 2017-06-22 DIAGNOSIS — Y9301 Activity, walking, marching and hiking: Secondary | ICD-10-CM | POA: Insufficient documentation

## 2017-06-22 DIAGNOSIS — S0992XA Unspecified injury of nose, initial encounter: Secondary | ICD-10-CM | POA: Diagnosis not present

## 2017-06-22 DIAGNOSIS — M1712 Unilateral primary osteoarthritis, left knee: Secondary | ICD-10-CM | POA: Diagnosis not present

## 2017-06-22 DIAGNOSIS — I2510 Atherosclerotic heart disease of native coronary artery without angina pectoris: Secondary | ICD-10-CM | POA: Diagnosis not present

## 2017-06-22 DIAGNOSIS — S0993XA Unspecified injury of face, initial encounter: Secondary | ICD-10-CM | POA: Insufficient documentation

## 2017-06-22 DIAGNOSIS — E1140 Type 2 diabetes mellitus with diabetic neuropathy, unspecified: Secondary | ICD-10-CM | POA: Diagnosis not present

## 2017-06-22 DIAGNOSIS — W01198A Fall on same level from slipping, tripping and stumbling with subsequent striking against other object, initial encounter: Secondary | ICD-10-CM | POA: Diagnosis not present

## 2017-06-22 DIAGNOSIS — I110 Hypertensive heart disease with heart failure: Secondary | ICD-10-CM | POA: Insufficient documentation

## 2017-06-22 DIAGNOSIS — Z8546 Personal history of malignant neoplasm of prostate: Secondary | ICD-10-CM | POA: Diagnosis not present

## 2017-06-22 DIAGNOSIS — Z950 Presence of cardiac pacemaker: Secondary | ICD-10-CM | POA: Diagnosis not present

## 2017-06-22 DIAGNOSIS — Y929 Unspecified place or not applicable: Secondary | ICD-10-CM | POA: Insufficient documentation

## 2017-06-22 DIAGNOSIS — S8392XA Sprain of unspecified site of left knee, initial encounter: Secondary | ICD-10-CM | POA: Diagnosis not present

## 2017-06-22 DIAGNOSIS — S0990XA Unspecified injury of head, initial encounter: Secondary | ICD-10-CM | POA: Diagnosis not present

## 2017-06-22 DIAGNOSIS — Y998 Other external cause status: Secondary | ICD-10-CM | POA: Diagnosis not present

## 2017-06-22 NOTE — ED Triage Notes (Signed)
His left knee gave out and he fell hitting his face on the wood floor. Injury to his nose and left knee.

## 2017-06-23 ENCOUNTER — Emergency Department (HOSPITAL_BASED_OUTPATIENT_CLINIC_OR_DEPARTMENT_OTHER)
Admission: EM | Admit: 2017-06-23 | Discharge: 2017-06-23 | Disposition: A | Discharge status: Home / Self Care | Payer: Medicare Other | Attending: Emergency Medicine | Admitting: Emergency Medicine

## 2017-06-23 DIAGNOSIS — S0993XA Unspecified injury of face, initial encounter: Secondary | ICD-10-CM

## 2017-06-23 DIAGNOSIS — S8392XA Sprain of unspecified site of left knee, initial encounter: Secondary | ICD-10-CM

## 2017-06-23 DIAGNOSIS — W19XXXA Unspecified fall, initial encounter: Secondary | ICD-10-CM

## 2017-06-23 NOTE — ED Provider Notes (Signed)
[place] EMERGENCY DEPARTMENT Provider Note   CSN: [id number] Arrival date & time: 06/22/17  2132     History   Chief Complaint Chief Complaint  Patient presents with  . Fall    HPI Johnny Ochoa. is a 72 y.o. male.  The history is provided by the patient and a significant other.  Fall  This is a new problem. Episode onset: Prior to arrival. The problem occurs constantly. The problem has not changed since onset.Pertinent negatives include no chest pain, no abdominal pain, no headaches and no shortness of breath. The symptoms are aggravated by walking. The symptoms are relieved by rest.  Patient with history of multiple medical conditions presents after fall.  He reports using bilateral canes for walking, and his left knee has severe arthritis.  Reports his left knee "gave out" and he fell down.  He reports striking his nose as well as hitting his left knee. No LOC.  No headache.  No vomiting.  No weakness.  No new neck or back pain.  No chest or abdominal pain. He is mostly concerned about the bloody nose he had that is since improved.  No new visual changes.  He does take Plavix at baseline.  He denies known history of severe arthritis, in the left knee and he he may be having knee replacement later on this year  Past Medical History:  Diagnosis Date  . Cancer [facility])    prostate  (Radiation only)  . CHF (congestive heart failure) ([place])   . Diabetes mellitus   . Hypertension   . Kidney stones   . Leg swelling   . Low back pain   . Myocardial infarction ([place]) 12/09/1993  . Obese   . OSA (obstructive sleep apnea)   . Peripheral neuropathy   . PTSD (post-traumatic stress disorder)     Patient Active Problem List   Diagnosis Date Noted  . Diabetic neuropathy ([place]) 02/26/2016  . Essential hypertension 02/26/2016  . Coronary artery disease 02/26/2016  . Hyperlipidemia 02/26/2016  . Elevated troponin 02/26/2016  . Morbid obesity ([place]) 02/26/2016  . Depression  with anxiety 02/26/2016  . Hypokalemia 02/26/2016  . Cardiac pacemaker in situ 02/26/2016  . CAP (community acquired pneumonia) 02/25/2016  . Multifocal pneumonia 02/25/2016  . Acute respiratory failure with hypoxia ([place]) 02/25/2016  . Acute on chronic systolic CHF (congestive heart failure) ([place]) 06/23/2014  . Uncontrolled diabetes mellitus with complications ([place]) 31/49/7026  . Anemia 11/30/2013    Past Surgical History:  Procedure Laterality Date  . CARDIAC DEFIBRILLATOR PLACEMENT    . CORONARY ANGIOPLASTY WITH STENT PLACEMENT  1995    x 3 stents  . JOINT REPLACEMENT  2004   right total knee replacement  . MULTIPLE TOOTH EXTRACTIONS     Total Tooth extraction  . PACEMAKER INSERTION    . PACEMAKER PLACEMENT          Home Medications    Prior to Admission medications   Medication Sig Start Date End Date Taking? Authorizing Provider  acetaminophen (TYLENOL) 500 MG tablet Take 1,000 mg 2 (two) times daily as needed by mouth (pain).    [provider]  albuterol (PROVENTIL HFA;VENTOLIN HFA) 108 (90 BASE) MCG/ACT inhaler Inhale 2 puffs into the lungs every 4 (four) hours as needed for wheezing or shortness of breath. 08/24/13   Orpah Greek, MD  aspirin EC 81 MG tablet Take 81 mg by mouth daily.    [provider]  atorvastatin (LIPITOR) 80  MG tablet Take 40 mg daily by mouth.     [provider]  clopidogrel (PLAVIX) 75 MG tablet Take 75 mg by mouth daily. 02/16/16   [provider]  docusate sodium (COLACE) 100 MG capsule Take 100 mg by mouth 2 (two) times daily.    [provider]  DULoxetine (CYMBALTA) 30 MG capsule Take 30 mg by mouth daily.    [provider]  furosemide (LASIX) 40 MG tablet Take 1 tablet (40 mg total) by mouth daily. Take an extra dose if you gain 2 lbs in 24 hours. 02/28/16   Rama, Venetia Maxon, MD  gabapentin (NEURONTIN) 400 MG capsule Take 400 mg by mouth 4 (four) times daily.    [provider]  insulin aspart (NOVOLOG) 100 UNIT/ML injection Inject 15-20 Units 3 (three) times daily with meals into the skin. Take 15 units with breakfast, 15 units with lunch, and 20 units with supper    [provider]  insulin glargine (LANTUS) 100 UNIT/ML injection Inject 44 Units 2 (two) times daily into the skin. Take 44 units in the morning and 45 units in the evening    [provider]  isosorbide mononitrate (IMDUR) 30 MG 24 hr tablet Take 30 mg daily by mouth.    [provider]  lurasidone (LATUDA) 40 MG TABS tablet Take 20 mg daily with breakfast by mouth.    [provider]  metolazone (ZAROXOLYN) 5 MG tablet Take 5 mg once a week by mouth. Takes on Sunday or Monday    [provider]  metoprolol succinate (TOPROL-XL) 100 MG 24 hr tablet Take 50 mg daily by mouth. Take with or immediately following a meal.    [provider]  Multiple Vitamin (MULTIVITAMIN) tablet Take 1 tablet by mouth daily.      [provider]  nitroGLYCERIN (NITROSTAT) 0.4 MG SL tablet Place 0.4 mg every 5 (five) minutes as needed under the tongue.     [provider]  omeprazole (PRILOSEC) 20 MG capsule Take 20 mg daily by mouth.     [provider]  oxycodone (OXY-IR) 5 MG capsule Take 5 mg every 6 (six) hours as needed by mouth for pain.    [provider]  polyethylene glycol (MIRALAX / GLYCOLAX) packet Take 17 g daily as needed by mouth.     [provider]  primidone (MYSOLINE) 50 MG tablet Take 2 (two) times daily by mouth.    [provider]  sacubitril-valsartan (ENTRESTO) 49-51 MG Take 1 tablet 2 (two) times daily by mouth.    [provider]  spironolactone (ALDACTONE) 25 MG tablet Take 25 mg by mouth daily. 02/16/16   [provider]  tamsulosin (FLOMAX) 0.4 MG CAPS capsule Take 0.8 mg by mouth at bedtime.     [provider]  terbinafine (LAMISIL) 1 % cream Apply 1  application topically daily as needed for itching.    [provider]  traZODone (DESYREL) 100 MG tablet Take 100 mg by mouth at bedtime.    [provider]    Family History Family History  Problem Relation Age of Onset  . Heart attack Mother   . Diabetes Other   . Hypertension Other   . Heart disease Other   . Arthritis Other     Social History Social History   Tobacco Use  . Smoking status: Former Smoker    Packs/day: 1.00    Years: 20.00    Pack years:  20.00    Types: Cigarettes    Last attempt to quit: 10/23/1980    Years since quitting: 36.6  . Smokeless tobacco: Never Used  Substance Use Topics  . Alcohol use: No    Alcohol/week: 0.0 oz  . Drug use: No     Allergies   Patient has no known allergies.   Review of Systems Review of Systems  Constitutional: Negative for fever.  HENT: Positive for nosebleeds.   Eyes: Negative for pain and visual disturbance.  Respiratory: Negative for shortness of breath.   Cardiovascular: Negative for chest pain.  Gastrointestinal: Negative for abdominal pain.  Musculoskeletal: Positive for arthralgias. Negative for neck pain.  Neurological: Negative for headaches.  All other systems reviewed and are negative.    Physical Exam Updated Vital Signs BP 128/64 (BP Location: Right Arm)   Pulse (!) 59   Temp 98.3 F (36.8 C) (Oral)   Resp 16   Ht 1.689 m (5' 6.5")   Wt 114.3 kg (252 lb)   SpO2 99%   BMI 40.06 kg/m   Physical Exam CONSTITUTIONAL: Chronically ill, no acute distress, walking to the room using canes without difficulty HEAD: Normocephalic/atraumatic EYES: EOMI/PERRL ENMT: Mucous membranes moist, mild tenderness of nose, no deformities, no blood in nares, no septal hematoma, no malocclusion, no trismus, no facial tenderness, no obvious dental injury NECK: supple no meningeal signs SPINE/BACK:entire spine nontender, no bruising/crepitance/stepoffs noted to spine CV: S1/S2 noted, no  murmurs/rubs/gallops noted LUNGS: Lungs are clear to auscultation bilaterally, no apparent distress ABDOMEN: soft, nontender GU:no cva tenderness NEURO: Pt is awake/alert/appropriate, moves all extremitiesx4. Patient is ambulatory EXTREMITIES: pulses normal/equal, full ROM, tenderness to left knee, without deformity.  No lacerations.  Mild tenderness to left tibia.  No deformities.  All other extremities/joints palpated/ranged and nontender SKIN: warm, color normal PSYCH: no abnormalities of mood noted, alert and oriented to situation   ED Treatments / Results  Labs (all labs ordered are listed, but only abnormal results are displayed) Labs Reviewed - No data to display  EKG None  Radiology Dg Nasal Bones  Result Date: 06/22/2017 CLINICAL DATA:  Initial evaluation for acute trauma, fall. EXAM: NASAL BONES - 3+ VIEW COMPARISON:  None. FINDINGS: No acute displaced nasal bone fracture identified. Linear lucency seen on lateral view most consistent with a normal nasofrontal suture. No other acute abnormality about the visualized bones of the face and skull. Paranasal sinuses grossly clear. Nasal septum midline and intact. IMPRESSION: No acute displaced nasal bone fracture identified. Electronically Signed   By: Jeannine Boga M.D.   On: 06/22/2017 22:34   Dg Knee Complete 4 Views Left  Result Date: 06/22/2017 CLINICAL DATA:  Knee pain. EXAM: LEFT KNEE - COMPLETE 4+ VIEW COMPARISON:  09/26/2013. FINDINGS: Tricompartmental degenerative change with meniscal calcification. Severely narrowed medial and patellar femoral compartments. Osteopenia. Osseous spurring. No effusion or fracture. IMPRESSION: Severe knee DJD.  Similar appearance to priors. Electronically Signed   By: Staci Righter M.D.   On: 06/22/2017 22:34    Procedures Procedures (including critical care time)  Medications Ordered in ED Medications - No data to display   Initial Impression / Assessment and Plan / ED Course  I  have reviewed the triage vital signs and the nursing notes.  Pertinent  imaging results that were available during my care of the patient were reviewed by me and considered in my medical decision making (see chart for details).     At the time of my evaluation, it is  been several hours since the fall.  He had no LOC or headache or vomiting.  Defer CT head.  Other than mild nasal tenderness, there is no other acute signs of traumatic injury to the face.  As for his knee, there is no signs of acute fracture.  He was already walking on without difficulty when I saw him. He will follow-up with his orthopedist  Final Clinical Impressions(s) / ED Diagnoses   Final diagnoses:  Fall, initial encounter  Sprain of left knee, unspecified ligament, initial encounter  Facial injury, initial encounter    ED Discharge Orders    None       Ripley Fraise, MD 06/23/17 [phone]

## 2017-06-28 DIAGNOSIS — Z95810 Presence of automatic (implantable) cardiac defibrillator: Secondary | ICD-10-CM | POA: Diagnosis not present

## 2017-07-13 DIAGNOSIS — R001 Bradycardia, unspecified: Secondary | ICD-10-CM | POA: Diagnosis not present

## 2017-07-13 DIAGNOSIS — I499 Cardiac arrhythmia, unspecified: Secondary | ICD-10-CM | POA: Diagnosis not present

## 2017-07-13 DIAGNOSIS — E782 Mixed hyperlipidemia: Secondary | ICD-10-CM | POA: Diagnosis not present

## 2017-07-13 DIAGNOSIS — Z95810 Presence of automatic (implantable) cardiac defibrillator: Secondary | ICD-10-CM | POA: Diagnosis not present

## 2017-08-05 DIAGNOSIS — E118 Type 2 diabetes mellitus with unspecified complications: Secondary | ICD-10-CM | POA: Diagnosis not present

## 2017-08-12 DIAGNOSIS — Z0000 Encounter for general adult medical examination without abnormal findings: Secondary | ICD-10-CM | POA: Diagnosis not present

## 2017-08-12 DIAGNOSIS — I10 Essential (primary) hypertension: Secondary | ICD-10-CM | POA: Diagnosis not present

## 2017-08-12 DIAGNOSIS — E785 Hyperlipidemia, unspecified: Secondary | ICD-10-CM | POA: Diagnosis not present

## 2017-08-12 DIAGNOSIS — I2510 Atherosclerotic heart disease of native coronary artery without angina pectoris: Secondary | ICD-10-CM | POA: Diagnosis not present

## 2017-08-12 DIAGNOSIS — E118 Type 2 diabetes mellitus with unspecified complications: Secondary | ICD-10-CM | POA: Diagnosis not present

## 2017-09-30 DIAGNOSIS — I5022 Chronic systolic (congestive) heart failure: Secondary | ICD-10-CM | POA: Diagnosis not present

## 2017-09-30 DIAGNOSIS — I255 Ischemic cardiomyopathy: Secondary | ICD-10-CM | POA: Diagnosis not present

## 2017-09-30 DIAGNOSIS — Z45018 Encounter for adjustment and management of other part of cardiac pacemaker: Secondary | ICD-10-CM | POA: Diagnosis not present

## 2017-10-21 ENCOUNTER — Encounter (HOSPITAL_BASED_OUTPATIENT_CLINIC_OR_DEPARTMENT_OTHER): Payer: Self-pay | Admitting: *Deleted

## 2017-10-21 ENCOUNTER — Other Ambulatory Visit: Payer: Self-pay

## 2017-10-21 ENCOUNTER — Inpatient Hospital Stay (HOSPITAL_BASED_OUTPATIENT_CLINIC_OR_DEPARTMENT_OTHER)
Admission: EM | Admit: 2017-10-21 | Discharge: 2017-10-26 | DRG: 378 | Disposition: A | Discharge status: Home / Self Care | Payer: Medicare Other | Attending: Internal Medicine | Admitting: Internal Medicine

## 2017-10-21 DIAGNOSIS — R197 Diarrhea, unspecified: Secondary | ICD-10-CM

## 2017-10-21 DIAGNOSIS — G4733 Obstructive sleep apnea (adult) (pediatric): Secondary | ICD-10-CM | POA: Diagnosis present

## 2017-10-21 DIAGNOSIS — Z87442 Personal history of urinary calculi: Secondary | ICD-10-CM | POA: Diagnosis not present

## 2017-10-21 DIAGNOSIS — Z950 Presence of cardiac pacemaker: Secondary | ICD-10-CM

## 2017-10-21 DIAGNOSIS — I2510 Atherosclerotic heart disease of native coronary artery without angina pectoris: Secondary | ICD-10-CM | POA: Diagnosis not present

## 2017-10-21 DIAGNOSIS — Z79899 Other long term (current) drug therapy: Secondary | ICD-10-CM

## 2017-10-21 DIAGNOSIS — E785 Hyperlipidemia, unspecified: Secondary | ICD-10-CM | POA: Diagnosis not present

## 2017-10-21 DIAGNOSIS — I5022 Chronic systolic (congestive) heart failure: Secondary | ICD-10-CM | POA: Diagnosis present

## 2017-10-21 DIAGNOSIS — E869 Volume depletion, unspecified: Secondary | ICD-10-CM | POA: Diagnosis not present

## 2017-10-21 DIAGNOSIS — I252 Old myocardial infarction: Secondary | ICD-10-CM | POA: Diagnosis not present

## 2017-10-21 DIAGNOSIS — K921 Melena: Secondary | ICD-10-CM | POA: Diagnosis present

## 2017-10-21 DIAGNOSIS — Z96651 Presence of right artificial knee joint: Secondary | ICD-10-CM | POA: Diagnosis not present

## 2017-10-21 DIAGNOSIS — F4310 Post-traumatic stress disorder, unspecified: Secondary | ICD-10-CM | POA: Diagnosis present

## 2017-10-21 DIAGNOSIS — R0602 Shortness of breath: Secondary | ICD-10-CM | POA: Diagnosis not present

## 2017-10-21 DIAGNOSIS — Z7982 Long term (current) use of aspirin: Secondary | ICD-10-CM

## 2017-10-21 DIAGNOSIS — Z87891 Personal history of nicotine dependence: Secondary | ICD-10-CM | POA: Diagnosis not present

## 2017-10-21 DIAGNOSIS — K7460 Unspecified cirrhosis of liver: Secondary | ICD-10-CM | POA: Diagnosis present

## 2017-10-21 DIAGNOSIS — K625 Hemorrhage of anus and rectum: Secondary | ICD-10-CM

## 2017-10-21 DIAGNOSIS — K219 Gastro-esophageal reflux disease without esophagitis: Secondary | ICD-10-CM | POA: Diagnosis present

## 2017-10-21 DIAGNOSIS — K5731 Diverticulosis of large intestine without perforation or abscess with bleeding: Secondary | ICD-10-CM | POA: Diagnosis not present

## 2017-10-21 DIAGNOSIS — E1142 Type 2 diabetes mellitus with diabetic polyneuropathy: Secondary | ICD-10-CM | POA: Diagnosis not present

## 2017-10-21 DIAGNOSIS — I110 Hypertensive heart disease with heart failure: Secondary | ICD-10-CM | POA: Diagnosis not present

## 2017-10-21 DIAGNOSIS — Z923 Personal history of irradiation: Secondary | ICD-10-CM | POA: Diagnosis not present

## 2017-10-21 DIAGNOSIS — Z8249 Family history of ischemic heart disease and other diseases of the circulatory system: Secondary | ICD-10-CM

## 2017-10-21 DIAGNOSIS — A09 Infectious gastroenteritis and colitis, unspecified: Secondary | ICD-10-CM | POA: Diagnosis not present

## 2017-10-21 DIAGNOSIS — Z6841 Body Mass Index (BMI) 40.0 and over, adult: Secondary | ICD-10-CM

## 2017-10-21 DIAGNOSIS — K7031 Alcoholic cirrhosis of liver with ascites: Secondary | ICD-10-CM

## 2017-10-21 DIAGNOSIS — D591 Other autoimmune hemolytic anemias: Secondary | ICD-10-CM | POA: Diagnosis not present

## 2017-10-21 DIAGNOSIS — K922 Gastrointestinal hemorrhage, unspecified: Secondary | ICD-10-CM | POA: Diagnosis present

## 2017-10-21 DIAGNOSIS — D62 Acute posthemorrhagic anemia: Secondary | ICD-10-CM | POA: Diagnosis present

## 2017-10-21 DIAGNOSIS — Z794 Long term (current) use of insulin: Secondary | ICD-10-CM

## 2017-10-21 DIAGNOSIS — Z95810 Presence of automatic (implantable) cardiac defibrillator: Secondary | ICD-10-CM

## 2017-10-21 DIAGNOSIS — D649 Anemia, unspecified: Secondary | ICD-10-CM | POA: Diagnosis not present

## 2017-10-21 DIAGNOSIS — Z833 Family history of diabetes mellitus: Secondary | ICD-10-CM

## 2017-10-21 DIAGNOSIS — Z955 Presence of coronary angioplasty implant and graft: Secondary | ICD-10-CM | POA: Diagnosis not present

## 2017-10-21 DIAGNOSIS — Z8546 Personal history of malignant neoplasm of prostate: Secondary | ICD-10-CM

## 2017-10-21 DIAGNOSIS — Z7902 Long term (current) use of antithrombotics/antiplatelets: Secondary | ICD-10-CM

## 2017-10-21 LAB — CBC
HCT: 37.9 % — ABNORMAL LOW (ref 39.0–52.0)
Hemoglobin: 12.8 g/dL — ABNORMAL LOW (ref 13.0–17.0)
MCH: 32.2 pg (ref 26.0–34.0)
MCHC: 33.8 g/dL (ref 30.0–36.0)
MCV: 95.5 fL (ref 78.0–100.0)
Platelets: 277 10*3/uL (ref 150–400)
RBC: 3.97 MIL/uL — ABNORMAL LOW (ref 4.22–5.81)
RDW: 13.6 % (ref 11.5–15.5)
WBC: 7.2 10*3/uL (ref 4.0–10.5)

## 2017-10-21 LAB — COMPREHENSIVE METABOLIC PANEL
ALT: 16 U/L (ref 0–44)
AST: 19 U/L (ref 15–41)
Albumin: 3.6 g/dL (ref 3.5–5.0)
Alkaline Phosphatase: 64 U/L (ref 38–126)
Anion gap: 9 (ref 5–15)
BUN: 18 mg/dL (ref 8–23)
CO2: 26 mmol/L (ref 22–32)
Calcium: 9 mg/dL (ref 8.9–10.3)
Chloride: 104 mmol/L (ref 98–111)
Creatinine, Ser: 1.06 mg/dL (ref 0.61–1.24)
GFR calc Af Amer: >60 mL/min (ref >60)
GFR calc non Af Amer: >60 mL/min (ref >60)
Glucose, Bld: 133 mg/dL — ABNORMAL HIGH (ref 70–99)
Potassium: 4.6 mmol/L (ref 3.5–5.1)
Sodium: 139 mmol/L (ref 135–145)
Total Bilirubin: 0.6 mg/dL (ref 0.3–1.2)
Total Protein: 7.7 g/dL (ref 6.5–8.1)

## 2017-10-21 LAB — OCCULT BLOOD X 1 CARD TO LAB, STOOL: Fecal Occult Bld: POSITIVE — AB

## 2017-10-21 NOTE — ED Triage Notes (Signed)
Blood in his stools x 3 this afternoon. Hx of anemia. No pain. He feels SOB.

## 2017-10-22 ENCOUNTER — Inpatient Hospital Stay (HOSPITAL_COMMUNITY): Payer: Medicare Other

## 2017-10-22 ENCOUNTER — Emergency Department (HOSPITAL_BASED_OUTPATIENT_CLINIC_OR_DEPARTMENT_OTHER): Payer: Medicare Other

## 2017-10-22 DIAGNOSIS — F4310 Post-traumatic stress disorder, unspecified: Secondary | ICD-10-CM | POA: Diagnosis present

## 2017-10-22 DIAGNOSIS — Z96651 Presence of right artificial knee joint: Secondary | ICD-10-CM | POA: Diagnosis present

## 2017-10-22 DIAGNOSIS — E785 Hyperlipidemia, unspecified: Secondary | ICD-10-CM | POA: Diagnosis present

## 2017-10-22 DIAGNOSIS — D62 Acute posthemorrhagic anemia: Secondary | ICD-10-CM | POA: Diagnosis not present

## 2017-10-22 DIAGNOSIS — Z6841 Body Mass Index (BMI) 40.0 and over, adult: Secondary | ICD-10-CM | POA: Diagnosis not present

## 2017-10-22 DIAGNOSIS — K5731 Diverticulosis of large intestine without perforation or abscess with bleeding: Secondary | ICD-10-CM | POA: Diagnosis not present

## 2017-10-22 DIAGNOSIS — K922 Gastrointestinal hemorrhage, unspecified: Secondary | ICD-10-CM | POA: Diagnosis not present

## 2017-10-22 DIAGNOSIS — R0602 Shortness of breath: Secondary | ICD-10-CM | POA: Diagnosis not present

## 2017-10-22 DIAGNOSIS — K625 Hemorrhage of anus and rectum: Secondary | ICD-10-CM | POA: Diagnosis not present

## 2017-10-22 DIAGNOSIS — Z95810 Presence of automatic (implantable) cardiac defibrillator: Secondary | ICD-10-CM | POA: Diagnosis not present

## 2017-10-22 DIAGNOSIS — I252 Old myocardial infarction: Secondary | ICD-10-CM | POA: Diagnosis not present

## 2017-10-22 DIAGNOSIS — E1142 Type 2 diabetes mellitus with diabetic polyneuropathy: Secondary | ICD-10-CM | POA: Diagnosis present

## 2017-10-22 DIAGNOSIS — K921 Melena: Secondary | ICD-10-CM | POA: Diagnosis not present

## 2017-10-22 DIAGNOSIS — K7460 Unspecified cirrhosis of liver: Secondary | ICD-10-CM | POA: Diagnosis present

## 2017-10-22 DIAGNOSIS — Z87891 Personal history of nicotine dependence: Secondary | ICD-10-CM | POA: Diagnosis not present

## 2017-10-22 DIAGNOSIS — E1121 Type 2 diabetes mellitus with diabetic nephropathy: Secondary | ICD-10-CM | POA: Diagnosis not present

## 2017-10-22 DIAGNOSIS — Z955 Presence of coronary angioplasty implant and graft: Secondary | ICD-10-CM | POA: Diagnosis not present

## 2017-10-22 DIAGNOSIS — E869 Volume depletion, unspecified: Secondary | ICD-10-CM | POA: Diagnosis present

## 2017-10-22 DIAGNOSIS — R197 Diarrhea, unspecified: Secondary | ICD-10-CM | POA: Diagnosis not present

## 2017-10-22 DIAGNOSIS — Z87442 Personal history of urinary calculi: Secondary | ICD-10-CM | POA: Diagnosis not present

## 2017-10-22 DIAGNOSIS — Z8546 Personal history of malignant neoplasm of prostate: Secondary | ICD-10-CM | POA: Diagnosis not present

## 2017-10-22 DIAGNOSIS — I5023 Acute on chronic systolic (congestive) heart failure: Secondary | ICD-10-CM | POA: Diagnosis not present

## 2017-10-22 DIAGNOSIS — Z923 Personal history of irradiation: Secondary | ICD-10-CM | POA: Diagnosis not present

## 2017-10-22 DIAGNOSIS — G4733 Obstructive sleep apnea (adult) (pediatric): Secondary | ICD-10-CM | POA: Diagnosis present

## 2017-10-22 DIAGNOSIS — I2510 Atherosclerotic heart disease of native coronary artery without angina pectoris: Secondary | ICD-10-CM | POA: Diagnosis present

## 2017-10-22 DIAGNOSIS — I5022 Chronic systolic (congestive) heart failure: Secondary | ICD-10-CM | POA: Diagnosis not present

## 2017-10-22 DIAGNOSIS — I110 Hypertensive heart disease with heart failure: Secondary | ICD-10-CM | POA: Diagnosis not present

## 2017-10-22 DIAGNOSIS — D591 Other autoimmune hemolytic anemias: Secondary | ICD-10-CM | POA: Diagnosis not present

## 2017-10-22 DIAGNOSIS — K219 Gastro-esophageal reflux disease without esophagitis: Secondary | ICD-10-CM | POA: Diagnosis present

## 2017-10-22 DIAGNOSIS — Z950 Presence of cardiac pacemaker: Secondary | ICD-10-CM | POA: Diagnosis not present

## 2017-10-22 DIAGNOSIS — K5730 Diverticulosis of large intestine without perforation or abscess without bleeding: Secondary | ICD-10-CM | POA: Diagnosis not present

## 2017-10-22 LAB — GLUCOSE, CAPILLARY
Glucose-Capillary: 80 mg/dL (ref 70–99)
Glucose-Capillary: 100 mg/dL — ABNORMAL HIGH (ref 70–99)
Glucose-Capillary: 102 mg/dL — ABNORMAL HIGH (ref 70–99)

## 2017-10-22 LAB — HEMOGLOBIN AND HEMATOCRIT, BLOOD
HCT: 36.1 % — ABNORMAL LOW (ref 39.0–52.0)
Hemoglobin: 12.2 g/dL — ABNORMAL LOW (ref 13.0–17.0)

## 2017-10-22 LAB — BRAIN NATRIURETIC PEPTIDE: B Natriuretic Peptide: 32.6 pg/mL (ref 0.0–100.0)

## 2017-10-22 LAB — HEMOGLOBIN
Hemoglobin: 11.6 g/dL — ABNORMAL LOW (ref 13.0–17.0)
Hemoglobin: 10.5 g/dL — ABNORMAL LOW (ref 13.0–17.0)

## 2017-10-22 LAB — PHOSPHORUS: Phosphorus: 3.5 mg/dL (ref 2.5–4.6)

## 2017-10-22 LAB — TROPONIN I
Troponin I: 0.03 ng/mL (ref <0.03)
Troponin I: 0.03 ng/mL (ref <0.03)

## 2017-10-22 LAB — MAGNESIUM: Magnesium: 1.7 mg/dL (ref 1.7–2.4)

## 2017-10-22 LAB — HEMATOCRIT
HCT: 35.8 % — ABNORMAL LOW (ref 39.0–52.0)
HCT: 31.9 % — ABNORMAL LOW (ref 39.0–52.0)

## 2017-10-22 MED ORDER — FOLIC ACID 1 MG PO TABS: 1.0000 mg | ORAL_TABLET | Freq: Every day | ORAL | Status: DC

## 2017-10-22 MED ORDER — PRIMIDONE 50 MG PO TABS
50.0000 mg | ORAL_TABLET | Freq: Two times a day (BID) | ORAL | Status: DC
  Administered 2017-10-22 – 2017-10-26 (×9): 50 mg via ORAL
  Filled 2017-10-22 (×9): qty 1

## 2017-10-22 MED ORDER — ALBUTEROL SULFATE (2.5 MG/3ML) 0.083% IN NEBU: 3.0000 mL | INHALATION_SOLUTION | RESPIRATORY_TRACT | Status: DC | PRN

## 2017-10-22 MED ORDER — IOPAMIDOL (ISOVUE-300) INJECTION 61%
INTRAVENOUS | Status: AC
  Administered 2017-10-22: 16:00:00
  Filled 2017-10-22: qty 30

## 2017-10-22 MED ORDER — TRAZODONE HCL 50 MG PO TABS
100.0000 mg | ORAL_TABLET | Freq: Every day | ORAL | Status: DC
  Administered 2017-10-22 – 2017-10-25 (×4): 100 mg via ORAL
  Filled 2017-10-22 (×4): qty 2

## 2017-10-22 MED ORDER — SODIUM CHLORIDE 0.9 % IV SOLN
INTRAVENOUS | Status: DC
  Administered 2017-10-22 – 2017-10-23 (×2): via INTRAVENOUS

## 2017-10-22 MED ORDER — ADULT MULTIVITAMIN W/MINERALS CH
1.0000 | ORAL_TABLET | Freq: Every day | ORAL | Status: DC
  Administered 2017-10-22 – 2017-10-26 (×5): 1 via ORAL
  Filled 2017-10-22 (×5): qty 1

## 2017-10-22 MED ORDER — LORAZEPAM 2 MG/ML IJ SOLN: 1.0000 mg | Freq: Four times a day (QID) | INTRAMUSCULAR | Status: DC | PRN

## 2017-10-22 MED ORDER — ISOSORBIDE MONONITRATE ER 30 MG PO TB24
30.0000 mg | ORAL_TABLET | Freq: Every day | ORAL | Status: DC
  Administered 2017-10-22 – 2017-10-23 (×2): 30 mg via ORAL
  Filled 2017-10-22 (×3): qty 1

## 2017-10-22 MED ORDER — ATORVASTATIN CALCIUM 40 MG PO TABS
40.0000 mg | ORAL_TABLET | Freq: Every day | ORAL | Status: DC
  Administered 2017-10-22 – 2017-10-26 (×5): 40 mg via ORAL
  Filled 2017-10-22 (×5): qty 1

## 2017-10-22 MED ORDER — PANTOPRAZOLE SODIUM 40 MG PO TBEC
40.0000 mg | DELAYED_RELEASE_TABLET | Freq: Every day | ORAL | Status: DC
  Administered 2017-10-22 – 2017-10-26 (×5): 40 mg via ORAL
  Filled 2017-10-22 (×5): qty 1

## 2017-10-22 MED ORDER — DULOXETINE HCL 30 MG PO CPEP
30.0000 mg | ORAL_CAPSULE | Freq: Every day | ORAL | Status: DC
  Administered 2017-10-22 – 2017-10-25 (×4): 30 mg via ORAL
  Filled 2017-10-22 (×4): qty 1

## 2017-10-22 MED ORDER — IOPAMIDOL (ISOVUE-300) INJECTION 61%
100.0000 mL | Freq: Once | INTRAVENOUS | Status: AC | PRN
  Administered 2017-10-22: 100 mL via INTRAVENOUS

## 2017-10-22 MED ORDER — ADULT MULTIVITAMIN W/MINERALS CH: 1.0000 | ORAL_TABLET | Freq: Every day | ORAL | Status: DC

## 2017-10-22 MED ORDER — LORAZEPAM 1 MG PO TABS: 1.0000 mg | ORAL_TABLET | Freq: Four times a day (QID) | ORAL | Status: DC | PRN

## 2017-10-22 MED ORDER — OXYCODONE HCL 5 MG PO TABS: 5.0000 mg | ORAL_TABLET | Freq: Four times a day (QID) | ORAL | Status: DC | PRN

## 2017-10-22 MED ORDER — INSULIN ASPART 100 UNIT/ML ~~LOC~~ SOLN
0.0000 [IU] | SUBCUTANEOUS | Status: DC
  Administered 2017-10-23: 1 [IU] via SUBCUTANEOUS
  Administered 2017-10-23 (×2): 2 [IU] via SUBCUTANEOUS
  Administered 2017-10-24: 1 [IU] via SUBCUTANEOUS
  Administered 2017-10-24: 2 [IU] via SUBCUTANEOUS
  Administered 2017-10-24: 1 [IU] via SUBCUTANEOUS
  Administered 2017-10-25: 2 [IU] via SUBCUTANEOUS
  Administered 2017-10-25 – 2017-10-26 (×6): 1 [IU] via SUBCUTANEOUS

## 2017-10-22 MED ORDER — LURASIDONE HCL 20 MG PO TABS
20.0000 mg | ORAL_TABLET | Freq: Every day | ORAL | Status: DC
  Administered 2017-10-23 – 2017-10-25 (×3): 20 mg via ORAL
  Filled 2017-10-22 (×3): qty 1

## 2017-10-22 MED ORDER — IOPAMIDOL (ISOVUE-300) INJECTION 61%
INTRAVENOUS | Status: AC
  Administered 2017-10-22: 18:00:00
  Filled 2017-10-22: qty 100

## 2017-10-22 MED ORDER — POTASSIUM CHLORIDE CRYS ER 20 MEQ PO TBCR: 40.0000 meq | EXTENDED_RELEASE_TABLET | ORAL | Status: DC

## 2017-10-22 MED ORDER — METOPROLOL SUCCINATE ER 50 MG PO TB24
50.0000 mg | ORAL_TABLET | Freq: Every day | ORAL | Status: DC
Start: 2017-10-22 — End: 2017-10-24
  Administered 2017-10-22 – 2017-10-23 (×2): 50 mg via ORAL
  Filled 2017-10-22 (×3): qty 1

## 2017-10-22 MED ORDER — VITAMIN B-1 100 MG PO TABS: 100.0000 mg | ORAL_TABLET | Freq: Every day | ORAL | Status: DC

## 2017-10-22 MED ORDER — ALBUMIN HUMAN 25 % IV SOLN: 100.0000 g | Freq: Once | INTRAVENOUS | Status: DC

## 2017-10-22 MED ORDER — GABAPENTIN 600 MG PO TABS
300.0000 mg | ORAL_TABLET | Freq: Three times a day (TID) | ORAL | Status: DC
  Administered 2017-10-22 – 2017-10-26 (×13): 300 mg via ORAL
  Filled 2017-10-22 (×12): qty 1

## 2017-10-22 MED ORDER — SACUBITRIL-VALSARTAN 49-51 MG PO TABS
1.0000 | ORAL_TABLET | Freq: Two times a day (BID) | ORAL | Status: DC
  Administered 2017-10-22 – 2017-10-24 (×5): 1 via ORAL
  Filled 2017-10-22 (×5): qty 1

## 2017-10-22 MED ORDER — SODIUM CHLORIDE 0.9% IV SOLUTION: Freq: Once | INTRAVENOUS | Status: DC

## 2017-10-22 MED ORDER — TAMSULOSIN HCL 0.4 MG PO CAPS
0.8000 mg | ORAL_CAPSULE | Freq: Every day | ORAL | Status: DC
  Administered 2017-10-22 – 2017-10-25 (×4): 0.8 mg via ORAL
  Filled 2017-10-22 (×4): qty 2

## 2017-10-22 MED ORDER — THIAMINE HCL 100 MG/ML IJ SOLN: 100.0000 mg | Freq: Every day | INTRAMUSCULAR | Status: DC

## 2017-10-22 NOTE — ED Notes (Signed)
Report given to Cindy with [facility] 

## 2017-10-22 NOTE — Progress Notes (Signed)
72 yo male with CAD, CHF, Dm2, neuropathy c/o diarrhea, and blood in stool

## 2017-10-22 NOTE — ED Provider Notes (Signed)
[facility] Provider Note   CSN: [id number] Arrival date & time: 10/21/17  2148     History   Chief Complaint Chief Complaint  Patient presents with  . Rectal Bleeding    HPI Johnny Ochoa. is a 72 y.o. male.  The history is provided by the patient.  He has history of hypertension, diabetes, hyperlipidemia, systolic heart failure, myocardial infarction, autoimmune hemolytic anemia and comes in with rectal bleeding since 6 PM tonight.  He has had diarrhea for the last 2 days, but started passing dark red blood at 6 PM.  He has had 4 bowel movements which have had blood in them, the last 2 were just blood.  He denies abdominal pain, nausea, vomiting.  He denies fever or chills.  Denies dizziness or lightheadedness.  He has noted some shortness of breath over the last 2 days.  He denies any chest pain, heaviness, tightness, pressure.  There have been no known sick contacts.  Past Medical History:  Diagnosis Date  . Cancer [facility])    prostate  (Radiation only)  . CHF (congestive heart failure) ([place])   . Diabetes mellitus   . Hypertension   . Kidney stones   . Leg swelling   . Low back pain   . Myocardial infarction ([place]) 12/09/1993  . Obese   . OSA (obstructive sleep apnea)   . Peripheral neuropathy   . PTSD (post-traumatic stress disorder)     Patient Active Problem List   Diagnosis Date Noted  . Diabetic neuropathy ([place]) 02/26/2016  . Essential hypertension 02/26/2016  . Coronary artery disease 02/26/2016  . Hyperlipidemia 02/26/2016  . Elevated troponin 02/26/2016  . Morbid obesity ([place]) 02/26/2016  . Depression with anxiety 02/26/2016  . Hypokalemia 02/26/2016  . Cardiac pacemaker in situ 02/26/2016  . CAP (community acquired pneumonia) 02/25/2016  . Multifocal pneumonia 02/25/2016  . Acute respiratory failure with hypoxia ([place]) 02/25/2016  . Acute on chronic systolic CHF (congestive heart failure) ([place]) 06/23/2014  . Uncontrolled  diabetes mellitus with complications ([place]) 95/63/8756  . Anemia 11/30/2013    Past Surgical History:  Procedure Laterality Date  . CARDIAC DEFIBRILLATOR PLACEMENT    . CORONARY ANGIOPLASTY WITH STENT PLACEMENT  1995    x 3 stents  . JOINT REPLACEMENT  2004   right total knee replacement  . MULTIPLE TOOTH EXTRACTIONS     Total Tooth extraction  . PACEMAKER INSERTION    . PACEMAKER PLACEMENT          Home Medications    Prior to Admission medications   Medication Sig Start Date End Date Taking? Authorizing Provider  acetaminophen (TYLENOL) 500 MG tablet Take 1,000 mg 2 (two) times daily as needed by mouth (pain).    [provider]  albuterol (PROVENTIL HFA;VENTOLIN HFA) 108 (90 BASE) MCG/ACT inhaler Inhale 2 puffs into the lungs every 4 (four) hours as needed for wheezing or shortness of breath. 08/24/13   Orpah Greek, MD  aspirin EC 81 MG tablet Take 81 mg by mouth daily.    [provider]  atorvastatin (LIPITOR) 80 MG tablet Take 40 mg daily by mouth.     [provider]  clopidogrel (PLAVIX) 75 MG tablet Take 75 mg by mouth daily. 02/16/16   [provider]  docusate sodium (COLACE) 100 MG capsule Take 100 mg by mouth 2 (two) times daily.    [provider]  DULoxetine (CYMBALTA) 30 MG capsule Take 30 mg by mouth  daily.    [provider]  furosemide (LASIX) 40 MG tablet Take 1 tablet (40 mg total) by mouth daily. Take an extra dose if you gain 2 lbs in 24 hours. 02/28/16   Rama, Venetia Maxon, MD  gabapentin (NEURONTIN) 400 MG capsule Take 400 mg by mouth 4 (four) times daily.    [provider]  insulin aspart (NOVOLOG) 100 UNIT/ML injection Inject 15-20 Units 3 (three) times daily with meals into the skin. Take 15 units with breakfast, 15 units with lunch, and 20 units with supper    [provider]  insulin glargine (LANTUS) 100 UNIT/ML injection Inject 44 Units 2 (two) times daily into the skin.  Take 44 units in the morning and 45 units in the evening    [provider]  isosorbide mononitrate (IMDUR) 30 MG 24 hr tablet Take 30 mg daily by mouth.    [provider]  lurasidone (LATUDA) 40 MG TABS tablet Take 20 mg daily with breakfast by mouth.    [provider]  metolazone (ZAROXOLYN) 5 MG tablet Take 5 mg once a week by mouth. Takes on Sunday or Monday    [provider]  metoprolol succinate (TOPROL-XL) 100 MG 24 hr tablet Take 50 mg daily by mouth. Take with or immediately following a meal.    [provider]  Multiple Vitamin (MULTIVITAMIN) tablet Take 1 tablet by mouth daily.      [provider]  nitroGLYCERIN (NITROSTAT) 0.4 MG SL tablet Place 0.4 mg every 5 (five) minutes as needed under the tongue.     [provider]  omeprazole (PRILOSEC) 20 MG capsule Take 20 mg daily by mouth.     [provider]  oxycodone (OXY-IR) 5 MG capsule Take 5 mg every 6 (six) hours as needed by mouth for pain.    [provider]  polyethylene glycol (MIRALAX / GLYCOLAX) packet Take 17 g daily as needed by mouth.     [provider]  primidone (MYSOLINE) 50 MG tablet Take 2 (two) times daily by mouth.    [provider]  sacubitril-valsartan (ENTRESTO) 49-51 MG Take 1 tablet 2 (two) times daily by mouth.    [provider]  spironolactone (ALDACTONE) 25 MG tablet Take 25 mg by mouth daily. 02/16/16   [provider]  tamsulosin (FLOMAX) 0.4 MG CAPS capsule Take 0.8 mg by mouth at bedtime.     [provider]  terbinafine (LAMISIL) 1 % cream Apply 1 application topically daily as needed for itching.    [provider]  traZODone (DESYREL) 100 MG tablet Take 100 mg by mouth at bedtime.    [provider]    Family History Family History  Problem Relation Age of Onset  . Heart attack Mother   . Diabetes Other   . Hypertension Other   . Heart disease Other    . Arthritis Other     Social History Social History   Tobacco Use  . Smoking status: Former Smoker    Packs/day: 1.00    Years: 20.00    Pack years: 20.00    Types: Cigarettes    Last attempt to quit: 10/23/1980    Years since quitting: 37.0  . Smokeless tobacco: Never Used  Substance Use Topics  . Alcohol use: No    Alcohol/week: 0.0 standard drinks  . Drug use: No     Allergies   Patient has no known allergies.   Review of Systems Review  of Systems  All other systems reviewed and are negative.    Physical Exam Updated Vital Signs BP 121/66 (BP Location: Left Arm)   Pulse 64   Temp 98.3 F (36.8 C) (Oral)   Resp 16   Ht 5' 6.5" (1.689 m)   Wt 115.2 kg   SpO2 100%   BMI 40.38 kg/m   Physical Exam  Nursing note and vitals reviewed.  72 year old male, resting comfortably and in no acute distress. Vital signs are normal. Oxygen saturation is 100%, which is normal. Head is normocephalic and atraumatic. PERRLA, EOMI. Oropharynx is clear. Neck is nontender and supple without adenopathy or JVD. Back is nontender and there is no CVA tenderness. Lungs are clear without rales, wheezes, or rhonchi. Chest is nontender. Heart has regular rate and rhythm with 3-5/0 systolic ejection murmur heard along the left sternal border. Abdomen is soft, flat, nontender without masses or hepatosplenomegaly and peristalsis is normoactive. Extremities have 1+ edema, full range of motion is present. Skin is warm and dry without rash. Neurologic: Mental status is normal, cranial nerves are intact, there are no motor or sensory deficits.  ED Treatments / Results  Labs (all labs ordered are listed, but only abnormal results are displayed) Labs Reviewed  COMPREHENSIVE METABOLIC PANEL - Abnormal; Notable for the following components:      Result Value   Glucose, Bld 133 (*)    All other components within normal limits  CBC - Abnormal; Notable for the following components:   RBC  3.97 (*)    Hemoglobin 12.8 (*)    HCT 37.9 (*)    All other components within normal limits  OCCULT BLOOD X 1 CARD TO LAB, STOOL - Abnormal; Notable for the following components:   Fecal Occult Bld POSITIVE (*)    All other components within normal limits  HEMOGLOBIN AND HEMATOCRIT, BLOOD - Abnormal; Notable for the following components:   Hemoglobin 12.2 (*)    HCT 36.1 (*)    All other components within normal limits  BRAIN NATRIURETIC PEPTIDE  POC OCCULT BLOOD, ED   Radiology Dg Chest 2 View  Result Date: 10/22/2017 CLINICAL DATA:  Rectal bleeding for 2 days. Shortness of breath and diarrhea. History of diabetes, hypertension, heart disease, former smoker. EXAM: CHEST - 2 VIEW COMPARISON:  11/17/2016 FINDINGS: Cardiac pacemaker. Mild hyperinflation suggesting emphysematous changes. Scattered fibrosis in the lungs suggesting chronic bronchitis. No airspace disease or consolidation. No blunting of costophrenic angles. No pneumothorax. Mediastinal contours appear intact. Degenerative changes in the spine. IMPRESSION: Emphysematous and chronic bronchitic changes in the lungs. No evidence of active pulmonary disease. Electronically Signed   By: Lucienne Capers M.D.   On: 10/22/2017 01:28    Procedures Procedures   Medications Ordered in ED Medications - No data to display   Initial Impression / Assessment and Plan / ED Course  I have reviewed the triage vital signs and the nursing notes.  Pertinent labs & imaging results that were available during my care of the patient were reviewed by me and considered in my medical decision making (see chart for details).  Diarrhea with rectal bleeding.  Stool Hemoccult had been obtained prior to my seeing the patient and has come back positive.  Orthostatic vital signs showed no significant change in pulse or blood pressure indicating no significant blood loss.  Hemoglobin has come back 12.8, which is increased over baseline.  Old records are  reviewed, and in April he had hemoglobin done at [facility] which was 12.2.  BUN, creatinine, electrolytes are all normal.  Because of complaints of shortness of breath, will check chest x-ray and BNP.  We will also check hemoglobin 4 hours after initial hemoglobin.    Hemoglobin has dropped 0.6 g.  He had an additional bowel movement which was bloody, but this time blood mixed with stool.  With dropping hemoglobin, I do not feel he is safe for discharge.  Chest x-ray shows no evidence of CHF or pneumonia and BNP has come back normal.  He will need to be admitted for serial hemoglobins.  Case is discussed with Dr. Maudie Mercury of [facility], who agrees to accept the patient in transfer.  Final Clinical Impressions(s) / ED Diagnoses   Final diagnoses:  Rectal bleeding  Normochromic normocytic anemia  Diarrhea of presumed infectious origin    ED Discharge Orders    None       Delora Fuel, MD 70/62/37 [phone]

## 2017-10-22 NOTE — Progress Notes (Signed)
CRITICAL VALUE ALERT  Critical Value: Troponin 0.03 Date & Time Notied:  10/22/2017  1330  Provider Notified: Dr. Marthenia Rolling notified   Orders Received/Actions taken: MD aware

## 2017-10-22 NOTE — H&P (Signed)
History and Physical  Verne Spurr. FFM:[id number] DOB: [birth year] DOA: 10/21/2017  Referring physician: ER provider PCP: Jani Gravel, MD  Outpatient Specialists:    Patient coming from: Home  Chief Complaint: Rectal bleed.  HPI:  Patient is a 72 year old African-American male, morbidly obese, with past medical history significant for myocardial infarction, hypertension, diabetes mellitus, peripheral neuropathy, congestive heart failure, PTSD, cancer, OSA, nephrolithiasis, hemolytic anemia and prior history of diverticulitis versus diverticular bleed.  Patient's loss EGD and colonoscopy was about 4 years ago, and no significant findings were reported.  Patient presents with 1 day history of rectal bleed.  According to the patient, he developed diarrhea about 2 days ago.  Diarrhea lasted about a day and was resolving as of yesterday.  Subsequently, patient started noticing rectal bleed.  Patient reports passing dark-colored/burgundy blood.  No use of NSAIDs.  No headache, and no neck pain, no chest pain, no fever chills, no nausea or vomiting and no urinary symptoms.  Patient reports having shortness of breath.  Hemoglobin on presentation to the hospital was 12.8, but patient could have been volume depleted.  Patient will be admitted for further assessment and management.  ED Course: On presentation to the ED, temperature was 98.0, blood pressure 110/80, heart rate of 59 to 69 bpm, respiratory rate of 16/min and O2 sat of 100%.  Chemistry done revealed sodium of 129, potassium of 4.6, chloride 104, CO2 26, BUN of 18 and creatinine of 1.06 and blood sugar of 133.  Cardiac BNP was 32.6.  CBC reveals WBC of 7.2, hemoglobin of 12.8, hematocrit 37.9, MCV of 95.5 with platelet count of 277.  Fecal occult blood came back positive.  Chest x-ray reveals emphysematous and chronic bronchitic changes in the lungs, with no evidence of active pulmonary disease. Pertinent labs: Kindly see above. EKG: Independently  reviewed.  Imaging: independently reviewed.   Review of Systems:  Negative for fever, visual changes, sore throat, rash, new muscle aches, chest pain, dysuria, n/v/abdominal pain.  Past Medical History:  Diagnosis Date  . Cancer [facility])    prostate  (Radiation only)  . CHF (congestive heart failure) ([place])   . Diabetes mellitus   . Hypertension   . Kidney stones   . Leg swelling   . Low back pain   . Myocardial infarction ([place]) 12/09/1993  . Obese   . OSA (obstructive sleep apnea)   . Peripheral neuropathy   . PTSD (post-traumatic stress disorder)     Past Surgical History:  Procedure Laterality Date  . CARDIAC DEFIBRILLATOR PLACEMENT    . CORONARY ANGIOPLASTY WITH STENT PLACEMENT  1995    x 3 stents  . JOINT REPLACEMENT  2004   right total knee replacement  . MULTIPLE TOOTH EXTRACTIONS     Total Tooth extraction  . PACEMAKER INSERTION    . PACEMAKER PLACEMENT       reports that he quit smoking about 37 years ago. His smoking use included cigarettes. He has a 20.00 pack-year smoking history. He has never used smokeless tobacco. He reports that he does not drink alcohol or use drugs.  No Known Allergies  Family History  Problem Relation Age of Onset  . Heart attack Mother   . Diabetes Other   . Hypertension Other   . Heart disease Other   . Arthritis Other      Prior to Admission medications   Medication Sig Start Date End Date Taking? Authorizing Provider  acetaminophen (TYLENOL) 500 MG tablet Take 1,000 mg  2 (two) times daily as needed by mouth (pain).    [provider]  albuterol (PROVENTIL HFA;VENTOLIN HFA) 108 (90 BASE) MCG/ACT inhaler Inhale 2 puffs into the lungs every 4 (four) hours as needed for wheezing or shortness of breath. 08/24/13   Orpah Greek, MD  aspirin EC 81 MG tablet Take 81 mg by mouth daily.    [provider]  atorvastatin (LIPITOR) 80 MG tablet Take 40 mg daily by mouth.     [provider]  clopidogrel  (PLAVIX) 75 MG tablet Take 75 mg by mouth daily. 02/16/16   [provider]  docusate sodium (COLACE) 100 MG capsule Take 100 mg by mouth 2 (two) times daily.    [provider]  DULoxetine (CYMBALTA) 30 MG capsule Take 30 mg by mouth daily.    [provider]  furosemide (LASIX) 40 MG tablet Take 1 tablet (40 mg total) by mouth daily. Take an extra dose if you gain 2 lbs in 24 hours. 02/28/16   Rama, Venetia Maxon, MD  gabapentin (NEURONTIN) 400 MG capsule Take 400 mg by mouth 4 (four) times daily.    [provider]  insulin aspart (NOVOLOG) 100 UNIT/ML injection Inject 15-20 Units 3 (three) times daily with meals into the skin. Take 15 units with breakfast, 15 units with lunch, and 20 units with supper    [provider]  insulin glargine (LANTUS) 100 UNIT/ML injection Inject 44 Units 2 (two) times daily into the skin. Take 44 units in the morning and 45 units in the evening    [provider]  isosorbide mononitrate (IMDUR) 30 MG 24 hr tablet Take 30 mg daily by mouth.    [provider]  lurasidone (LATUDA) 40 MG TABS tablet Take 20 mg daily with breakfast by mouth.    [provider]  metolazone (ZAROXOLYN) 5 MG tablet Take 5 mg once a week by mouth. Takes on Sunday or Monday    [provider]  metoprolol succinate (TOPROL-XL) 100 MG 24 hr tablet Take 50 mg daily by mouth. Take with or immediately following a meal.    [provider]  Multiple Vitamin (MULTIVITAMIN) tablet Take 1 tablet by mouth daily.      [provider]  nitroGLYCERIN (NITROSTAT) 0.4 MG SL tablet Place 0.4 mg every 5 (five) minutes as needed under the tongue.     [provider]  omeprazole (PRILOSEC) 20 MG capsule Take 20 mg daily by mouth.     [provider]  oxycodone (OXY-IR) 5 MG capsule Take 5 mg every 6 (six) hours as needed by mouth for pain.    [provider]  polyethylene glycol (MIRALAX /  GLYCOLAX) packet Take 17 g daily as needed by mouth.     [provider]  primidone (MYSOLINE) 50 MG tablet Take 2 (two) times daily by mouth.    [provider]  sacubitril-valsartan (ENTRESTO) 49-51 MG Take 1 tablet 2 (two) times daily by mouth.    [provider]  spironolactone (ALDACTONE) 25 MG tablet Take 25 mg by mouth daily. 02/16/16   [provider]  tamsulosin (FLOMAX) 0.4 MG CAPS capsule Take 0.8 mg by mouth at bedtime.     [provider]  terbinafine (LAMISIL) 1 % cream Apply 1 application topically daily as needed for itching.    [provider]  traZODone (DESYREL) 100 MG tablet Take 100 mg by mouth at bedtime.    [provider]  Physical Exam: Vitals:   10/22/17 0213 10/22/17 0443 10/22/17 0704 10/22/17 0704  BP: 132/69 110/80  120/61  Pulse: (!) 59 64  65  Resp: 16 18  17   Temp:    98.4 F (36.9 C)  TempSrc:    Oral  SpO2: 98% 98%  100%  Weight:   117.9 kg   Height:   5\' 6"  (1.676 m)    Constitutional:  . Appears calm and comfortable.  Patient is morbidly obese. Eyes:  Marland Kitchen Mild pallor. No jaundice.  ENMT:  . external ears, nose appear normal Neck:  . Neck is supple. No JVD Respiratory:  . CTA bilaterally, no w/r/r.  . Respiratory effort normal. No retractions or accessory muscle use Cardiovascular:  . S1S2 . No LE extremity edema   Abdomen:  . Abdomen is obese, soft and non tender. Organs are difficult to assess. Neurologic:  . Awake and alert. . Moves all limbs.  Wt Readings from Last 3 Encounters:  10/22/17 117.9 kg  06/22/17 114.3 kg  02/25/16 124.8 kg    I have personally reviewed following labs and imaging studies  Labs on Admission:  CBC: Recent Labs  Lab 10/21/17 2206 10/22/17 0212  WBC 7.2  --   HGB 12.8* 12.2*  HCT 37.9* 36.1*  MCV 95.5  --   PLT 277  --    Basic Metabolic Panel: Recent Labs  Lab 10/21/17 2206  NA 139  K 4.6  CL 104  CO2 26  GLUCOSE 133*    BUN 18  CREATININE 1.06  CALCIUM 9.0   Liver Function Tests: Recent Labs  Lab 10/21/17 2206  AST 19  ALT 16  ALKPHOS 64  BILITOT 0.6  PROT 7.7  ALBUMIN 3.6   No results for input(s): LIPASE, AMYLASE in the last 168 hours. No results for input(s): AMMONIA in the last 168 hours. Coagulation Profile: No results for input(s): INR, PROTIME in the last 168 hours. Cardiac Enzymes: No results for input(s): CKTOTAL, CKMB, CKMBINDEX, TROPONINI in the last 168 hours. BNP (last 3 results) No results for input(s): PROBNP in the last 8760 hours. HbA1C: No results for input(s): HGBA1C in the last 72 hours. CBG: No results for input(s): GLUCAP in the last 168 hours. Lipid Profile: No results for input(s): CHOL, HDL, LDLCALC, TRIG, CHOLHDL, LDLDIRECT in the last 72 hours. Thyroid Function Tests: No results for input(s): TSH, T4TOTAL, FREET4, T3FREE, THYROIDAB in the last 72 hours. Anemia Panel: No results for input(s): VITAMINB12, FOLATE, FERRITIN, TIBC, IRON, RETICCTPCT in the last 72 hours. Urine analysis:    Component Value Date/Time   COLORURINE AMBER (A) 02/12/2016 2334   APPEARANCEUR CLEAR 02/12/2016 2334   LABSPEC 1.017 02/12/2016 2334   PHURINE 6.0 02/12/2016 2334   GLUCOSEU NEGATIVE 02/12/2016 2334   HGBUR NEGATIVE 02/12/2016 2334   BILIRUBINUR SMALL (A) 02/12/2016 2334   KETONESUR NEGATIVE 02/12/2016 2334   PROTEINUR NEGATIVE 02/12/2016 2334   UROBILINOGEN 1.0 06/19/2014 2158   NITRITE NEGATIVE 02/12/2016 2334   LEUKOCYTESUR TRACE (A) 02/12/2016 2334   Sepsis Labs: @LABRCNTIP (procalcitonin:4,lacticidven:4) )No results found for this or any previous visit (from the past 240 hour(s)).    Radiological Exams on Admission: Dg Chest 2 View  Result Date: 10/22/2017 CLINICAL DATA:  Rectal bleeding for 2 days. Shortness of breath and diarrhea. History of diabetes, hypertension, heart disease, former smoker. EXAM: CHEST - 2 VIEW COMPARISON:  11/17/2016 FINDINGS: Cardiac  pacemaker. Mild hyperinflation suggesting emphysematous changes. Scattered fibrosis in the lungs suggesting chronic bronchitis. No  airspace disease or consolidation. No blunting of costophrenic angles. No pneumothorax. Mediastinal contours appear intact. Degenerative changes in the spine. IMPRESSION: Emphysematous and chronic bronchitic changes in the lungs. No evidence of active pulmonary disease. Electronically Signed   By: Lucienne Capers M.D.   On: 10/22/2017 01:28    EKG: Independently reviewed.   Active Problems:   Blood in stool   Liver cirrhosis (HCC)   GI bleed   Assessment/Plan GI bleed: Difficult to pinpoint the exact location of the bleed (i.e. upper or lower GI bleed).  My feeling is that this is a lower GI bleed. Will monitor H/H We will hydrate patient We will consult GI team We will manage patient supportively  Will hold off IV Protonix for now. Further management will depend on hospital course  Acute blood loss anemia: Monitor and transfuse with packed red blood cell as deemed necessary. Follow H/H.  Volume depletion: Cautiously hydrate patient No prior echo results visualize, therefore, patient's EF is unknown to me.  Coronary artery disease/MI: Stable for now. No symptoms of chest pain. However, patient report-shortness of breath on presentation.  Patient is a diabetic. We will cycle troponin.  Diabetes mellitus: We will hold round-the-clock insulin as patient is n.p.o. Sliding scale insulin coverage Continue to optimize.  Hypertension: This is currently optimized. Continue current regimen.  Congestive heart failure, type unknown: As mentioned above, patient's EF is unknown to me.   Cautious hydration. Currently compensated.  Morbid obesity/OSA: We will consider using CPAP at nighttime. Diet and exercise.  Further management will depend on hospital course.  DVT prophylaxis: SCD Code Status: Full Family Communication: Wife Disposition Plan:  Home eventually Consults called: GI Admission status: Inpatient   Patient is a 72 year old male, morbidly obese, with history of MI, OSA, diabetes mellitus and hypertension and marks of the co-morbidities.  Patient presents with GI bleed.  Patient's clinical situation is such that patient needs to be inpatient as there is risk of further deterioration in patient's health if patient is not managed on an inpatient basis.  Current and pending laboratory work-up and imaging findings were taken into consideration as well.  Time spent: 65 minutes  Dana Allan, MD  [facility] Pager #: [phone] 7PM-7AM contact night coverage as above   10/22/2017, 11:26 AM

## 2017-10-22 NOTE — Progress Notes (Signed)
Pt was brought in by care link from [facility], on arrival to the floor he was alert and oriented x4 oriented to the room  per pt walks with cane at home but did not bring it to the hospital with him, no complain of pain made comfortable in bed able to demonstrate how to use call [place] to call when needed, phone within reach, no skin issue observed,

## 2017-10-22 NOTE — ED Notes (Signed)
Pt resting in NAD, family at bedside, call [place] within reach. Pt denies any needs at this time.

## 2017-10-22 NOTE — Consult Note (Signed)
[facility] Consult  Referring Provider: Bonnell Public, MD Primary Care Physician:  Jani Gravel, MD Primary Gastroenterologist: UNASSIGNED  Reason for Consultation:  Hematochezia  HPI: Johnny Ochoa. is a 72 y.o. male was in his usual state of health until yesterday evening when he developed 3 episodes of painless rectal bleeding at home and decided to go to the ER at [facility]. He had 2 more episodes of painless rectal bleeding while he was in the ER and was subsequently transferred to [facility] for admission. Patient reports 1 more episode of rectal bleeding after hospitalization. Each of these episodes are described as moderate amount of fresh blood to burgundy clot-like stool without rectal pain, with some fecal urgency. Patient reports having a colonoscopy in 2014 for anemia of unknown origin along with EGD and was diagnosed with hemolytic anemia at that point and remembers being transfused at least 15 units of PRBC. 2 days prior to presentation he had multiple episodes of loose stool watery bowel movements without associated fever, nausea, vomiting or abdominal pain. Normally he reports regular bowel movements. He has acid reflux and takes a PPI on a daily basis. He denies difficulty swallowing or pain on swallowing. No recent change in appetite or unintentional weight loss. Patient takes a baby aspirin daily and last dose of Plavix was yesterday morning. He has history of radiation for prostate cancer. Patient has 3 cardiac stents, last placed in 2010,along with a permanent pacemaker and defibrillator placement. He denies use of over-the-counter NSAIDs, he is on narcotics for chronic back pain.  Past Medical History:  Diagnosis Date  . Cancer [facility])    prostate  (Radiation only)  . CHF (congestive heart failure) ([place])   . Diabetes mellitus   . Hypertension   . Kidney stones   . Leg swelling   . Low back pain   . Myocardial infarction ([place])  12/09/1993  . Obese   . OSA (obstructive sleep apnea)   . Peripheral neuropathy   . PTSD (post-traumatic stress disorder)     Past Surgical History:  Procedure Laterality Date  . CARDIAC DEFIBRILLATOR PLACEMENT    . CORONARY ANGIOPLASTY WITH STENT PLACEMENT  1995    x 3 stents  . JOINT REPLACEMENT  2004   right total knee replacement  . MULTIPLE TOOTH EXTRACTIONS     Total Tooth extraction  . PACEMAKER INSERTION    . PACEMAKER PLACEMENT      Prior to Admission medications   Medication Sig Start Date End Date Taking? Authorizing Provider  acetaminophen (TYLENOL) 500 MG tablet Take 1,000 mg 2 (two) times daily as needed by mouth (pain).    [provider]  albuterol (PROVENTIL HFA;VENTOLIN HFA) 108 (90 BASE) MCG/ACT inhaler Inhale 2 puffs into the lungs every 4 (four) hours as needed for wheezing or shortness of breath. 08/24/13   Orpah Greek, MD  aspirin EC 81 MG tablet Take 81 mg by mouth daily.    [provider]  atorvastatin (LIPITOR) 80 MG tablet Take 40 mg daily by mouth.     [provider]  clopidogrel (PLAVIX) 75 MG tablet Take 75 mg by mouth daily. 02/16/16   [provider]  docusate sodium (COLACE) 100 MG capsule Take 100 mg by mouth 2 (two) times daily.    [provider]  DULoxetine (CYMBALTA) 30 MG capsule Take 30 mg by mouth daily.    [provider]  furosemide (LASIX) 40 MG tablet Take 1  tablet (40 mg total) by mouth daily. Take an extra dose if you gain 2 lbs in 24 hours. 02/28/16   Rama, Venetia Maxon, MD  gabapentin (NEURONTIN) 400 MG capsule Take 400 mg by mouth 4 (four) times daily.    [provider]  insulin aspart (NOVOLOG) 100 UNIT/ML injection Inject 15-20 Units 3 (three) times daily with meals into the skin. Take 15 units with breakfast, 15 units with lunch, and 20 units with supper    [provider]  insulin glargine (LANTUS) 100 UNIT/ML injection Inject 44 Units 2 (two) times  daily into the skin. Take 44 units in the morning and 45 units in the evening    [provider]  isosorbide mononitrate (IMDUR) 30 MG 24 hr tablet Take 30 mg daily by mouth.    [provider]  lurasidone (LATUDA) 40 MG TABS tablet Take 20 mg daily with breakfast by mouth.    [provider]  metolazone (ZAROXOLYN) 5 MG tablet Take 5 mg once a week by mouth. Takes on Sunday or Monday    [provider]  metoprolol succinate (TOPROL-XL) 100 MG 24 hr tablet Take 50 mg daily by mouth. Take with or immediately following a meal.    [provider]  Multiple Vitamin (MULTIVITAMIN) tablet Take 1 tablet by mouth daily.      [provider]  nitroGLYCERIN (NITROSTAT) 0.4 MG SL tablet Place 0.4 mg every 5 (five) minutes as needed under the tongue.     [provider]  omeprazole (PRILOSEC) 20 MG capsule Take 20 mg daily by mouth.     [provider]  oxycodone (OXY-IR) 5 MG capsule Take 5 mg every 6 (six) hours as needed by mouth for pain.    [provider]  polyethylene glycol (MIRALAX / GLYCOLAX) packet Take 17 g daily as needed by mouth.     [provider]  primidone (MYSOLINE) 50 MG tablet Take 2 (two) times daily by mouth.    [provider]  sacubitril-valsartan (ENTRESTO) 49-51 MG Take 1 tablet 2 (two) times daily by mouth.    [provider]  spironolactone (ALDACTONE) 25 MG tablet Take 25 mg by mouth daily. 02/16/16   [provider]  tamsulosin (FLOMAX) 0.4 MG CAPS capsule Take 0.8 mg by mouth at bedtime.     [provider]  terbinafine (LAMISIL) 1 % cream Apply 1 application topically daily as needed for itching.    [provider]  traZODone (DESYREL) 100 MG tablet Take 100 mg by mouth at bedtime.    [provider]    Current Facility-Administered Medications  Medication Dose Route Frequency Provider Last Rate Last Dose  . 0.9 %  sodium chloride  infusion   Intravenous Continuous Dana Allan I, MD      . albuterol (PROVENTIL) (2.5 MG/3ML) 0.083% nebulizer solution 3 mL  3 mL Inhalation Q4H PRN Dana Allan I, MD      . atorvastatin (LIPITOR) tablet 40 mg  40 mg Oral Daily Dana Allan I, MD   40 mg at 10/22/17 1317  . DULoxetine (CYMBALTA) DR capsule 30 mg  30 mg Oral Daily Dana Allan I, MD   30 mg at 10/22/17 1317  . gabapentin (NEURONTIN) tablet 300 mg  300 mg Oral TID Dana Allan I, MD      . isosorbide mononitrate (IMDUR) 24 hr tablet 30 mg  30 mg Oral Daily Bonnell Public, MD      . [  START ON 10/23/2017] lurasidone (LATUDA) tablet 20 mg  20 mg Oral Q breakfast Dana Allan I, MD      . metoprolol succinate (TOPROL-XL) 24 hr tablet 50 mg  50 mg Oral Daily Dana Allan I, MD      . multivitamin with minerals tablet 1 tablet  1 tablet Oral Daily Dana Allan I, MD      . oxyCODONE (Oxy IR/ROXICODONE) immediate release tablet 5 mg  5 mg Oral Q6H PRN Dana Allan I, MD      . primidone (MYSOLINE) tablet 50 mg  50 mg Oral BID Dana Allan I, MD      . sacubitril-valsartan (ENTRESTO) 49-51 mg per tablet  1 tablet Oral BID Dana Allan I, MD      . tamsulosin (FLOMAX) capsule 0.8 mg  0.8 mg Oral QHS Dana Allan I, MD      . traZODone (DESYREL) tablet 100 mg  100 mg Oral QHS Bonnell Public, MD        Allergies as of 10/21/2017  . (No Known Allergies)    Family History  Problem Relation Age of Onset  . Heart attack Mother   . Diabetes Other   . Hypertension Other   . Heart disease Other   . Arthritis Other     Social History   Socioeconomic History  . Marital status: Married    Spouse name: Not on file  . Number of children: Not on file  . Years of education: Not on file  . Highest education level: Not on file  Occupational History  . Not on file  Social Needs  . Financial resource strain: Not on file  . Food insecurity:    Worry: Not on file     Inability: Not on file  . Transportation needs:    Medical: Not on file    Non-medical: Not on file  Tobacco Use  . Smoking status: Former Smoker    Packs/day: 1.00    Years: 20.00    Pack years: 20.00    Types: Cigarettes    Last attempt to quit: 10/23/1980    Years since quitting: 37.0  . Smokeless tobacco: Never Used  Substance and Sexual Activity  . Alcohol use: No    Alcohol/week: 0.0 standard drinks  . Drug use: No  . Sexual activity: Not on file  Lifestyle  . Physical activity:    Days per week: Not on file    Minutes per session: Not on file  . Stress: Not on file  Relationships  . Social connections:    Talks on phone: Not on file    Gets together: Not on file    Attends religious service: Not on file    Active member of club or organization: Not on file    Attends meetings of clubs or organizations: Not on file    Relationship status: Not on file  . Intimate partner violence:    Fear of current or ex partner: Not on file    Emotionally abused: Not on file    Physically abused: Not on file    Forced sexual activity: Not on file  Other Topics Concern  . Not on file  Social History Narrative  . Not on file    Review of Systems: Positive for: GI: Described in detail in HPI.    Gen: Denies any fever, chills, rigors, night sweats, anorexia, fatigue, weakness, malaise, involuntary weight loss, and sleep disorder CV: Denies chest pain, angina, palpitations, syncope, orthopnea,  PND, peripheral edema, and claudication. Resp: Denies dyspnea, cough, sputum, wheezing, coughing up blood. GU : Denies urinary burning, blood in urine, urinary frequency, urinary hesitancy, nocturnal urination, and urinary incontinence. MS: Denies joint pain or swelling.  Denies muscle weakness, cramps, atrophy.  Derm: Denies rash, itching, oral ulcerations, hives, unhealing ulcers.  Psych: Denies depression, anxiety, memory loss, suicidal ideation, hallucinations,  and confusion. Heme: rectal  bleeding,Denies bruising  and enlarged lymph nodes. Neuro:  Denies any headaches, dizziness, paresthesias. Endo:   DM,Denies any problems with thyroid, adrenal function.  Physical Exam: Vital signs in last 24 hours: Temp:  [98.3 F (36.8 C)-98.4 F (36.9 C)] 98.4 F (36.9 C) (09/06 0704) Pulse Rate:  [59-69] 65 (09/06 0704) Resp:  [16-18] 17 (09/06 0704) BP: (110-132)/(61-80) 120/61 (09/06 0704) SpO2:  [98 %-100 %] 100 % (09/06 0704) Weight:  [115.2 kg-117.9 kg] 117.9 kg (09/06 0704)    General:   Alert,  Well-developed, overweight, pleasant and cooperative in NAD Head:  Normocephalic and atraumatic. Eyes:  Sclera clear, no icterus.   Conjunctiva pink. Ears:  Normal auditory acuity. Nose:  No deformity, discharge,  or lesions. Mouth:  No deformity or lesions.  Oropharynx pink & moist. Neck:  Supple; no masses or thyromegaly. Lungs:  Clear throughout to auscultation.   No wheezes, crackles, or rhonchi. No acute distress. Heart:  Regular rate and rhythm; no murmurs, clicks, rubs,  or gallops. Extremities:  Without clubbing or edema. Chronic bilateral lower extremity skin changes Neurologic:  Alert and  oriented x4;  grossly normal neurologically. Skin:  Intact without significant lesions or rashes. Psych:  Alert and cooperative. Normal mood and affect. Abdomen:  Soft, nontender and nondistended. No masses, hepatosplenomegaly or hernias noted. Normal bowel sounds, without guarding, and without rebound.         Lab Results: Recent Labs    10/21/17 2206 10/22/17 0212 10/22/17 1136  WBC 7.2  --   --   HGB 12.8* 12.2* 11.6*  HCT 37.9* 36.1* 35.8*  PLT 277  --   --    BMET Recent Labs    10/21/17 2206  NA 139  K 4.6  CL 104  CO2 26  GLUCOSE 133*  BUN 18  CREATININE 1.06  CALCIUM 9.0   LFT Recent Labs    10/21/17 2206  PROT 7.7  ALBUMIN 3.6  AST 19  ALT 16  ALKPHOS 64  BILITOT 0.6   PT/INR No results for input(s): LABPROT, INR in the last 72  hours.  Studies/Results: Dg Chest 2 View  Result Date: 10/22/2017 CLINICAL DATA:  Rectal bleeding for 2 days. Shortness of breath and diarrhea. History of diabetes, hypertension, heart disease, former smoker. EXAM: CHEST - 2 VIEW COMPARISON:  11/17/2016 FINDINGS: Cardiac pacemaker. Mild hyperinflation suggesting emphysematous changes. Scattered fibrosis in the lungs suggesting chronic bronchitis. No airspace disease or consolidation. No blunting of costophrenic angles. No pneumothorax. Mediastinal contours appear intact. Degenerative changes in the spine. IMPRESSION: Emphysematous and chronic bronchitic changes in the lungs. No evidence of active pulmonary disease. Electronically Signed   By: Lucienne Capers M.D.   On: 10/22/2017 01:28    Impression: Painless hematochezia most likely diverticular bleeding  Currently hemodynamically stable Hemoglobin 12.8/12.2/11.6 Last dose of Plavix yesterday morning Upper GI bleed is less likely,BUN/creatinine ratio normal at 18/1.06  Multiple comorbidities-coronary artery disease with stents, diabetes, hypertension, permanent pacemaker and AICD,CHF, peripheral neuropathy  Plan: Ideally would benefit from a diagnostic colonoscopy, however, last dose of Plavix was yesterday morning(ideally needs to  be on hold for 5 days). Plan colonoscopy in the next few days, unless patient becomes hemodynamically unstable or has uncontrollable hematochezia, in that case, it needs to be done emergently, or he may need a bleeding scan and embolization.  Monitor H&H and transfuse as needed( to be noted - patient had required 15 units of PRBC transfusion in 2014 when he was diagnosed with hemolytic anemia and reports having previous antibodies and specific requirement for transfusions), will send blood for crossmatch and typing in case transfusion is needed.  Meanwhile will get a CAT scan of the abdomen and pelvis to rule out a large colonic lesion Start the patient on clear  liquid diet. Will follow clinical course.    LOS: 0 days   Ronnette Juniper, MD  10/22/2017, 1:17 PM  Pager [phone] If no answer or after 5 PM call [phone]

## 2017-10-23 LAB — GLUCOSE, CAPILLARY
Glucose-Capillary: 105 mg/dL — ABNORMAL HIGH (ref 70–99)
Glucose-Capillary: 113 mg/dL — ABNORMAL HIGH (ref 70–99)
Glucose-Capillary: 139 mg/dL — ABNORMAL HIGH (ref 70–99)
Glucose-Capillary: 157 mg/dL — ABNORMAL HIGH (ref 70–99)
Glucose-Capillary: 187 mg/dL — ABNORMAL HIGH (ref 70–99)
Glucose-Capillary: 89 mg/dL (ref 70–99)
Glucose-Capillary: 91 mg/dL (ref 70–99)

## 2017-10-23 LAB — CBC
HCT: 30.4 % — ABNORMAL LOW (ref 39.0–52.0)
Hemoglobin: 9.9 g/dL — ABNORMAL LOW (ref 13.0–17.0)
MCH: 31.6 pg (ref 26.0–34.0)
MCHC: 32.6 g/dL (ref 30.0–36.0)
MCV: 97.1 fL (ref 78.0–100.0)
Platelets: 220 10*3/uL (ref 150–400)
RBC: 3.13 MIL/uL — ABNORMAL LOW (ref 4.22–5.81)
RDW: 13.9 % (ref 11.5–15.5)
WBC: 6.3 10*3/uL (ref 4.0–10.5)

## 2017-10-23 LAB — BASIC METABOLIC PANEL
Anion gap: 10 (ref 5–15)
BUN: 12 mg/dL (ref 8–23)
CO2: 23 mmol/L (ref 22–32)
Calcium: 8.6 mg/dL — ABNORMAL LOW (ref 8.9–10.3)
Chloride: 106 mmol/L (ref 98–111)
Creatinine, Ser: 0.99 mg/dL (ref 0.61–1.24)
GFR calc Af Amer: >60 mL/min (ref >60)
GFR calc non Af Amer: >60 mL/min (ref >60)
Glucose, Bld: 128 mg/dL — ABNORMAL HIGH (ref 70–99)
Potassium: 3.9 mmol/L (ref 3.5–5.1)
Sodium: 139 mmol/L (ref 135–145)

## 2017-10-23 NOTE — Progress Notes (Signed)
[facility] Progress Note  Subjective: The patient states that he is still seeing some reddish blood when he has bowel movements. He did not have any overnight  But this morning did have an episode. No abdominal pain. No dizziness or lightheadedness. He does have a history of diverticulosis from what he tells me from a colonoscopy a few years ago.  Objective: Vital signs in last 24 hours: Temp:  [98.1 F (36.7 C)-98.6 F (37 C)] 98.6 F (37 C) (09/06 2140) Pulse Rate:  [64-71] 64 (09/07 0357) Resp:  [16-20] 18 (09/07 0357) BP: (101-132)/(53-70) 101/53 (09/07 0357) SpO2:  [98 %-100 %] 98 % (09/07 0357) Weight change: 2.686 kg   PE:  No distress  Heart regular rhythm  Abdomen soft nontender  Lab Results: Results for orders placed or performed during the hospital encounter of 10/21/17 (from the past 24 hour(s))  Troponin I     Status: Abnormal   Collection Time: 10/22/17 11:36 AM  Result Value Ref Range   Troponin I 0.03 (HH) <0.03 ng/mL  Magnesium     Status: None   Collection Time: 10/22/17 11:36 AM  Result Value Ref Range   Magnesium 1.7 1.7 - 2.4 mg/dL  Phosphorus     Status: None   Collection Time: 10/22/17 11:36 AM  Result Value Ref Range   Phosphorus 3.5 2.5 - 4.6 mg/dL  Hemoglobin     Status: Abnormal   Collection Time: 10/22/17 11:36 AM  Result Value Ref Range   Hemoglobin 11.6 (L) 13.0 - 17.0 g/dL  Hematocrit     Status: Abnormal   Collection Time: 10/22/17 11:36 AM  Result Value Ref Range   HCT 35.8 (L) 39.0 - 52.0 %  Glucose, capillary     Status: None   Collection Time: 10/22/17  2:03 PM  Result Value Ref Range   Glucose-Capillary 80 70 - 99 mg/dL  Troponin I     Status: Abnormal   Collection Time: 10/22/17  3:00 PM  Result Value Ref Range   Troponin I 0.03 (HH) <0.03 ng/mL  Type and screen [place]     Status: None (Preliminary result)   Collection Time: 10/22/17  3:07 PM  Result Value Ref Range   ABO/RH(D) O POS     Antibody Screen POS    Sample Expiration 10/25/2017    Antibody Identification WARM AUTOANTIBODY    DAT, IgG POS    Antibody ID,T [place]      WARM AUTOANTIBODY Performed at [facility], [place] [address], [place], [place] [address]    Unit Number [id number]    Blood Component Type RED CELLS,LR    Unit division 00    Status of Unit ALLOCATED    Transfusion Status OK TO TRANSFUSE    Crossmatch Result COMPATIBLE    Unit Number [id number]    Blood Component Type RED CELLS,LR    Unit division 00    Status of Unit ALLOCATED    Transfusion Status OK TO TRANSFUSE    Crossmatch Result COMPATIBLE   Glucose, capillary     Status: Abnormal   Collection Time: 10/22/17  5:16 PM  Result Value Ref Range   Glucose-Capillary 100 (H) 70 - 99 mg/dL  Hemoglobin     Status: Abnormal   Collection Time: 10/22/17  7:03 PM  Result Value Ref Range   Hemoglobin 10.5 (L) 13.0 - 17.0 g/dL  Hematocrit     Status: Abnormal   Collection Time: 10/22/17  7:03 PM  Result Value Ref Range   HCT 31.9 (L) 39.0 - 52.0 %  Glucose, capillary     Status: Abnormal   Collection Time: 10/22/17  9:42 PM  Result Value Ref Range   Glucose-Capillary 102 (H) 70 - 99 mg/dL  Glucose, capillary     Status: None   Collection Time: 10/23/17 12:51 AM  Result Value Ref Range   Glucose-Capillary 91 70 - 99 mg/dL  Basic metabolic panel     Status: Abnormal   Collection Time: 10/23/17  2:58 AM  Result Value Ref Range   Sodium 139 135 - 145 mmol/L   Potassium 3.9 3.5 - 5.1 mmol/L   Chloride 106 98 - 111 mmol/L   CO2 23 22 - 32 mmol/L   Glucose, Bld 128 (H) 70 - 99 mg/dL   BUN 12 8 - 23 mg/dL   Creatinine, Ser 0.99 0.61 - 1.24 mg/dL   Calcium 8.6 (L) 8.9 - 10.3 mg/dL   GFR calc non Af Amer >60 >60 mL/min   GFR calc Af Amer >60 >60 mL/min   Anion gap 10 5 - 15  CBC     Status: Abnormal   Collection Time: 10/23/17  2:58 AM  Result Value Ref Range   WBC 6.3 4.0 - 10.5 K/uL   RBC 3.13 (L) 4.22 - 5.81 MIL/uL    Hemoglobin 9.9 (L) 13.0 - 17.0 g/dL   HCT 30.4 (L) 39.0 - 52.0 %   MCV 97.1 78.0 - 100.0 fL   MCH 31.6 26.0 - 34.0 pg   MCHC 32.6 30.0 - 36.0 g/dL   RDW 13.9 11.5 - 15.5 %   Platelets 220 150 - 400 K/uL  Glucose, capillary     Status: Abnormal   Collection Time: 10/23/17  3:57 AM  Result Value Ref Range   Glucose-Capillary 113 (H) 70 - 99 mg/dL  Glucose, capillary     Status: Abnormal   Collection Time: 10/23/17  7:51 AM  Result Value Ref Range   Glucose-Capillary 105 (H) 70 - 99 mg/dL    Studies/Results: Ct Abdomen Pelvis W Contrast  Result Date: 10/22/2017 CLINICAL DATA:  Bloody stool and diarrhea for 2 days. EXAM: CT ABDOMEN AND PELVIS WITH CONTRAST TECHNIQUE: Multidetector CT imaging of the abdomen and pelvis was performed using the standard protocol following bolus administration of intravenous contrast. CONTRAST:  151mL ISOVUE-300 IOPAMIDOL (ISOVUE-300) INJECTION 61% COMPARISON:  Body CT 02/06/2015 FINDINGS: Lower chest: No acute abnormality. Pacemaker leads partially visualized. Hepatobiliary: Cholelithiasis.  Normal appearance of the liver. Pancreas: Unremarkable. No pancreatic ductal dilatation or surrounding inflammatory changes. Spleen: Normal in size without focal abnormality. Adrenals/Urinary Tract: Bilateral hypertrophy of the adrenal glands. Bilateral too small to be actually characterized hypoattenuated circumscribed lesions within the kidneys. No nephrolithiasis or hydronephrosis. Normal urinary bladder. Stomach/Bowel: Stomach is within normal limits. Appendix appears normal. No evidence of small bowel wall thickening, distention, or inflammatory changes. Diffuse left colonic diverticulosis. Mild pericolonic fat stranding along the distal descending colon may represent chronic diverticulitis. The rectum is smooth and featureless. Vascular/Lymphatic: Aortic atherosclerosis. No enlarged abdominal or pelvic lymph nodes. Reproductive: Prostate is unremarkable. Other: No abdominal wall  hernia. No abdominopelvic ascites. Subcutaneous thickening in the bilateral lower abdominal wall, likely iatrogenic. Musculoskeletal: Spondylosis of the lumbosacral spine. Sclerotic changes of the right sacroiliac joint, chronic. IMPRESSION: Diffuse left colonic diverticulosis with possible chronic diverticulitis of the descending colon. Featureless appearance of the rectum, likely inflammatory/infectious. Cholelithiasis. Electronically Signed   By: Fidela Salisbury M.D.   On:  10/22/2017 17:15      Assessment: Lower GI bleed most likely of diverticular origin. Symptoms aggravated by Plavix.  Plan:   Continue to hold Plavix. Monitor H&H. Follow clinically. If he has excessive bleeding the next thing I would order would be a nuclear medicine GI bleeding scan and if it were positive proceed with angiography with embolization. If this close down and stops as we expected to with Plavix cessation then we will plan to proceed with follow-up colonoscopy next week.    SAM F Tollie Canada 10/23/2017, 10:29 AM  Pager: [phone] If no answer or after 5 PM call [phone] Lab Results  Component Value Date   HGB 9.9 (L) 10/23/2017   HGB 10.5 (L) 10/22/2017   HGB 11.6 (L) 10/22/2017   HCT 30.4 (L) 10/23/2017   HCT 31.9 (L) 10/22/2017   HCT 35.8 (L) 10/22/2017   ALKPHOS 64 10/21/2017   ALKPHOS 44 02/12/2016   ALKPHOS 62 06/21/2014   AST 19 10/21/2017   AST 21 02/12/2016   AST 29 06/21/2014   ALT 16 10/21/2017   ALT 13 (L) 02/12/2016   ALT 24 06/21/2014

## 2017-10-23 NOTE — Progress Notes (Signed)
Pt not wearing CPAP for the night.  Pt has declined use for several nights.  RT continues to be avail if pt changes his mind.

## 2017-10-23 NOTE — Progress Notes (Addendum)
PROGRESS NOTE    Johnny Ochoa.  FFM:[id number] DOB: Aug 20, 1945 DOA: 10/21/2017 PCP: Jani Gravel, MD   Brief Narrative: Patient is a 72 year old with past medical history of morbid obesity, coronary artery disease, hypertension, diabetic mellitus, peripheral neuropathy, congestive heart failure, diverticulosis who presented to the emergency department with 1 day history of hematochezia.  Also complained of diarrhea.  No history of use of NSAIDs.  Hemoglobin was stable on presentation.  GI consulted.  Plan for colonoscopy.  Assessment & Plan:   Active Problems:   Blood in stool   Liver cirrhosis (HCC)   GI bleed  Hematochezia: Hemoglobin this morning 9.9.  He is hemodynamically stable.  Denies any rectal bleed this morning.  Denies any abdominal pain, nausea or vomiting. CT abdomen/pelvis done yesterday showed chronic diverticulosis of the descending colon, left diffuse colonic diverticulosis.  Most likely this is a source of hematochezia. GI following.  Plan for colonoscopy.  Could not do colonoscopy soon because he was on Plavix.  Currently on clear liquid diet.  His last colonoscopy/EGD was about 4 years ago.  Acute blood loss anemia: Hemoglobin this morning 9.9.  Does not need transfusion at this point.  History of coronary artery disease/MI: Stable.  Denies any chest pain.  Plavix on hold.  Diabetes Mellitus: Continue sliding scale insulin.  Hypertension: Currently blood pressure is controlled.  Continue current regimen.  History of congestive heart failure: No prior echo report on the system.  Status post pacemaker.  On Entresto.  Currently heart failure is compensated.IV fluids discontinued.  History of morbid obesity/OSA: CPAP at nighttime.    DVT prophylaxis: SCD Code Status: Full Family Communication: Family member present at the bedside Disposition Plan: Home after colonoscopy, clearance by GI   Consultants: GI  Procedures: None Antimicrobials:  None  Subjective: Patient seen and examined the bedside this morning.  Remains comfortable.  Denies any abdominal pain, nausea or vomiting.  Denies any rectal bleed  Objective: Vitals:   10/22/17 0704 10/22/17 1449 10/22/17 2140 10/23/17 0357  BP: 120/61 132/70 102/62 (!) 101/53  Pulse: 65 71 69 64  Resp: 17 20 16 18   Temp: 98.4 F (36.9 C) 98.1 F (36.7 C) 98.6 F (37 C)   TempSrc: Oral  Oral   SpO2: 100% 100% 99% 98%  Weight:      Height:        Intake/Output Summary (Last 24 hours) at 10/23/2017 0901 Last data filed at 10/23/2017 0753 Gross per 24 hour  Intake 1752.34 ml  Output 850 ml  Net 902.34 ml   Filed Weights   10/21/17 2157 10/22/17 0704  Weight: 115.2 kg 117.9 kg    Examination:  General exam: Appears calm and comfortable ,Not in distress, morbidly obese HEENT:PERRL,Oral mucosa moist, Ear/Nose normal on gross exam Respiratory system: Bilateral equal air entry, normal vesicular breath sounds, no wheezes or crackles  Cardiovascular system: S1 & S2 heard, RRR. No JVD, murmurs, rubs, gallops or clicks. No pedal edema. Gastrointestinal system: Abdomen is nondistended, soft and nontender. No organomegaly or masses felt. Normal bowel sounds heard. Central nervous system: Alert and oriented. No focal neurological deficits. Extremities: No edema, no clubbing ,no cyanosis, distal peripheral pulses palpable. Skin: No rashes, lesions or ulcers,no icterus ,no pallor MSK: Normal muscle bulk,tone ,power Psychiatry: Judgement and insight appear normal. Mood & affect appropriate.     Data Reviewed: I have personally reviewed following labs and imaging studies  CBC: Recent Labs  Lab 10/21/17 2206 10/22/17 0212 10/22/17 1136  10/22/17 1903 10/23/17 0258  WBC 7.2  --   --   --  6.3  HGB 12.8* 12.2* 11.6* 10.5* 9.9*  HCT 37.9* 36.1* 35.8* 31.9* 30.4*  MCV 95.5  --   --   --  97.1  PLT 277  --   --   --  703   Basic Metabolic Panel: Recent Labs  Lab 10/21/17 2206  10/22/17 1136 10/23/17 0258  NA 139  --  139  K 4.6  --  3.9  CL 104  --  106  CO2 26  --  23  GLUCOSE 133*  --  128*  BUN 18  --  12  CREATININE 1.06  --  0.99  CALCIUM 9.0  --  8.6*  MG  --  1.7  --   PHOS  --  3.5  --    GFR: Estimated Creatinine Clearance: 80.3 mL/min (by C-G formula based on SCr of 0.99 mg/dL). Liver Function Tests: Recent Labs  Lab 10/21/17 2206  AST 19  ALT 16  ALKPHOS 64  BILITOT 0.6  PROT 7.7  ALBUMIN 3.6   No results for input(s): LIPASE, AMYLASE in the last 168 hours. No results for input(s): AMMONIA in the last 168 hours. Coagulation Profile: No results for input(s): INR, PROTIME in the last 168 hours. Cardiac Enzymes: Recent Labs  Lab 10/22/17 1136 10/22/17 1500  TROPONINI 0.03* 0.03*   BNP (last 3 results) No results for input(s): PROBNP in the last 8760 hours. HbA1C: No results for input(s): HGBA1C in the last 72 hours. CBG: Recent Labs  Lab 10/22/17 1716 10/22/17 2142 10/23/17 0051 10/23/17 0357 10/23/17 0751  GLUCAP 100* 102* 91 113* 105*   Lipid Profile: No results for input(s): CHOL, HDL, LDLCALC, TRIG, CHOLHDL, LDLDIRECT in the last 72 hours. Thyroid Function Tests: No results for input(s): TSH, T4TOTAL, FREET4, T3FREE, THYROIDAB in the last 72 hours. Anemia Panel: No results for input(s): VITAMINB12, FOLATE, FERRITIN, TIBC, IRON, RETICCTPCT in the last 72 hours. Sepsis Labs: No results for input(s): PROCALCITON, LATICACIDVEN in the last 168 hours.  No results found for this or any previous visit (from the past 240 hour(s)).       Radiology Studies: Dg Chest 2 View  Result Date: 10/22/2017 CLINICAL DATA:  Rectal bleeding for 2 days. Shortness of breath and diarrhea. History of diabetes, hypertension, heart disease, former smoker. EXAM: CHEST - 2 VIEW COMPARISON:  11/17/2016 FINDINGS: Cardiac pacemaker. Mild hyperinflation suggesting emphysematous changes. Scattered fibrosis in the lungs suggesting chronic  bronchitis. No airspace disease or consolidation. No blunting of costophrenic angles. No pneumothorax. Mediastinal contours appear intact. Degenerative changes in the spine. IMPRESSION: Emphysematous and chronic bronchitic changes in the lungs. No evidence of active pulmonary disease. Electronically Signed   By: Lucienne Capers M.D.   On: 10/22/2017 01:28   Ct Abdomen Pelvis W Contrast  Result Date: 10/22/2017 CLINICAL DATA:  Bloody stool and diarrhea for 2 days. EXAM: CT ABDOMEN AND PELVIS WITH CONTRAST TECHNIQUE: Multidetector CT imaging of the abdomen and pelvis was performed using the standard protocol following bolus administration of intravenous contrast. CONTRAST:  175mL ISOVUE-300 IOPAMIDOL (ISOVUE-300) INJECTION 61% COMPARISON:  Body CT 02/06/2015 FINDINGS: Lower chest: No acute abnormality. Pacemaker leads partially visualized. Hepatobiliary: Cholelithiasis.  Normal appearance of the liver. Pancreas: Unremarkable. No pancreatic ductal dilatation or surrounding inflammatory changes. Spleen: Normal in size without focal abnormality. Adrenals/Urinary Tract: Bilateral hypertrophy of the adrenal glands. Bilateral too small to be actually characterized hypoattenuated circumscribed lesions within the  kidneys. No nephrolithiasis or hydronephrosis. Normal urinary bladder. Stomach/Bowel: Stomach is within normal limits. Appendix appears normal. No evidence of small bowel wall thickening, distention, or inflammatory changes. Diffuse left colonic diverticulosis. Mild pericolonic fat stranding along the distal descending colon may represent chronic diverticulitis. The rectum is smooth and featureless. Vascular/Lymphatic: Aortic atherosclerosis. No enlarged abdominal or pelvic lymph nodes. Reproductive: Prostate is unremarkable. Other: No abdominal wall hernia. No abdominopelvic ascites. Subcutaneous thickening in the bilateral lower abdominal wall, likely iatrogenic. Musculoskeletal: Spondylosis of the lumbosacral  spine. Sclerotic changes of the right sacroiliac joint, chronic. IMPRESSION: Diffuse left colonic diverticulosis with possible chronic diverticulitis of the descending colon. Featureless appearance of the rectum, likely inflammatory/infectious. Cholelithiasis. Electronically Signed   By: Fidela Salisbury M.D.   On: 10/22/2017 17:15        Scheduled Meds: . sodium chloride   Intravenous Once  . atorvastatin  40 mg Oral Daily  . DULoxetine  30 mg Oral Daily  . gabapentin  300 mg Oral TID  . insulin aspart  0-9 Units Subcutaneous Q4H  . isosorbide mononitrate  30 mg Oral Daily  . lurasidone  20 mg Oral Q breakfast  . metoprolol succinate  50 mg Oral Daily  . multivitamin with minerals  1 tablet Oral Daily  . pantoprazole  40 mg Oral Daily  . primidone  50 mg Oral BID  . sacubitril-valsartan  1 tablet Oral BID  . tamsulosin  0.8 mg Oral QHS  . traZODone  100 mg Oral QHS   Continuous Infusions:   LOS: 1 day    Time spent:25 mins. More than 50% of that time was spent in counseling and/or coordination of care.      Shelly Coss, MD [facility] Pager [phone]  If 7PM-7AM, please contact night-coverage [email or web address] Password [email or web address] 10/23/2017, 9:01 AM

## 2017-10-24 LAB — CBC WITH DIFFERENTIAL/PLATELET
Abs Immature Granulocytes: 0 10*3/uL (ref 0.0–0.1)
Basophils Absolute: 0 10*3/uL (ref 0.0–0.1)
Basophils Relative: 0 %
Eosinophils Absolute: 0.3 10*3/uL (ref 0.0–0.7)
Eosinophils Relative: 4 %
HCT: 31.2 % — ABNORMAL LOW (ref 39.0–52.0)
Hemoglobin: 10.1 g/dL — ABNORMAL LOW (ref 13.0–17.0)
Immature Granulocytes: 1 %
Lymphocytes Relative: 24 %
Lymphs Abs: 1.7 10*3/uL (ref 0.7–4.0)
MCH: 31.7 pg (ref 26.0–34.0)
MCHC: 32.4 g/dL (ref 30.0–36.0)
MCV: 97.8 fL (ref 78.0–100.0)
Monocytes Absolute: 0.7 10*3/uL (ref 0.1–1.0)
Monocytes Relative: 10 %
Neutro Abs: 4.3 10*3/uL (ref 1.7–7.7)
Neutrophils Relative %: 61 %
Platelets: 220 10*3/uL (ref 150–400)
RBC: 3.19 MIL/uL — ABNORMAL LOW (ref 4.22–5.81)
RDW: 13.8 % (ref 11.5–15.5)
WBC: 7.2 10*3/uL (ref 4.0–10.5)

## 2017-10-24 LAB — GLUCOSE, CAPILLARY
Glucose-Capillary: 115 mg/dL — ABNORMAL HIGH (ref 70–99)
Glucose-Capillary: 120 mg/dL — ABNORMAL HIGH (ref 70–99)
Glucose-Capillary: 140 mg/dL — ABNORMAL HIGH (ref 70–99)
Glucose-Capillary: 183 mg/dL — ABNORMAL HIGH (ref 70–99)
Glucose-Capillary: 136 mg/dL — ABNORMAL HIGH (ref 70–99)

## 2017-10-24 NOTE — Progress Notes (Signed)
[facility] Progress Note  Subjective: The patient is doing well today. He has not had any further bleeding overnight or today.  Objective: Vital signs in last 24 hours: Temp:  [97.5 F (36.4 C)-99.4 F (37.4 C)] 99.4 F (37.4 C) (09/08 0845) Pulse Rate:  [65-70] 68 (09/08 1055) Resp:  [16] 16 (09/07 1404) BP: (98-126)/(42-56) 98/42 (09/08 1055) SpO2:  [97 %-100 %] 97 % (09/08 0500) Weight change:    PE:  No distress  Abdomen soft nontender  Lab Results: Results for orders placed or performed during the hospital encounter of 10/21/17 (from the past 24 hour(s))  Glucose, capillary     Status: Abnormal   Collection Time: 10/23/17 11:51 AM  Result Value Ref Range   Glucose-Capillary 157 (H) 70 - 99 mg/dL  Glucose, capillary     Status: Abnormal   Collection Time: 10/23/17  4:31 PM  Result Value Ref Range   Glucose-Capillary 139 (H) 70 - 99 mg/dL  Glucose, capillary     Status: Abnormal   Collection Time: 10/23/17  7:59 PM  Result Value Ref Range   Glucose-Capillary 187 (H) 70 - 99 mg/dL   Comment 1 Notify RN    Comment 2 Document in Chart   Glucose, capillary     Status: None   Collection Time: 10/23/17 11:56 PM  Result Value Ref Range   Glucose-Capillary 89 70 - 99 mg/dL   Comment 1 Notify RN    Comment 2 Document in Chart   CBC with Differential/Platelet     Status: Abnormal   Collection Time: 10/24/17  2:04 AM  Result Value Ref Range   WBC 7.2 4.0 - 10.5 K/uL   RBC 3.19 (L) 4.22 - 5.81 MIL/uL   Hemoglobin 10.1 (L) 13.0 - 17.0 g/dL   HCT 31.2 (L) 39.0 - 52.0 %   MCV 97.8 78.0 - 100.0 fL   MCH 31.7 26.0 - 34.0 pg   MCHC 32.4 30.0 - 36.0 g/dL   RDW 13.8 11.5 - 15.5 %   Platelets 220 150 - 400 K/uL   Neutrophils Relative % 61 %   Neutro Abs 4.3 1.7 - 7.7 K/uL   Lymphocytes Relative 24 %   Lymphs Abs 1.7 0.7 - 4.0 K/uL   Monocytes Relative 10 %   Monocytes Absolute 0.7 0.1 - 1.0 K/uL   Eosinophils Relative 4 %   Eosinophils Absolute 0.3 0.0 - 0.7  K/uL   Basophils Relative 0 %   Basophils Absolute 0.0 0.0 - 0.1 K/uL   Immature Granulocytes 1 %   Abs Immature Granulocytes 0.0 0.0 - 0.1 K/uL  Glucose, capillary     Status: Abnormal   Collection Time: 10/24/17  4:12 AM  Result Value Ref Range   Glucose-Capillary 115 (H) 70 - 99 mg/dL   Comment 1 Notify RN    Comment 2 Document in Chart   Glucose, capillary     Status: Abnormal   Collection Time: 10/24/17  9:05 AM  Result Value Ref Range   Glucose-Capillary 120 (H) 70 - 99 mg/dL    Studies/Results: No results found.    Assessment: Lower GI bleed most likely of diverticular origin. This appears to have stopped clinically. He has been off Plavix for 3 days now. We will plan for probable colonoscopy on Tuesday. We will advance diet today.  Plan:   Advance diet. Prep for colonoscopy tomorrow with anticipation of doing procedure on Tuesday. Continue to hold Plavix.    SAM F Catelin Manthe 10/24/2017, 11:15  AM  Pager: [phone] If no answer or after 5 PM call [phone]

## 2017-10-24 NOTE — Progress Notes (Signed)
PROGRESS NOTE    Johnny Ochoa.  TMH:[id number] DOB: [birth year] DOA: 10/21/2017 PCP: Jani Gravel, MD   Brief Narrative: Patient is a 72 year old with past medical history of morbid obesity, coronary artery disease, hypertension, diabetic mellitus, peripheral neuropathy, congestive heart failure, diverticulosis who presented to the emergency department with 1 day history of hematochezia.  Also complained of diarrhea.  No history of use of NSAIDs.  Hemoglobin was stable on presentation.  GI consulted.  Plan for colonoscopy tomorrow.  Assessment & Plan:   Active Problems:   Blood in stool   Liver cirrhosis (HCC)   GI bleed  Hematochezia: Hemoglobin this morning 10.1.    Denies any rectal bleed this morning.  Denies any abdominal pain, nausea or vomiting. CT abdomen/pelvis  showed chronic diverticulosis of the descending colon, left diffuse colonic diverticulosis.  Most likely this is a source of hematochezia. GI following.  Plan for colonoscopy tomorrow.  Could not do colonoscopy soon because he was on Plavix.  Currently on clear liquid diet.  His last colonoscopy/EGD was about 4 years ago.  Acute blood loss anemia: Hemoglobin this morning 10.1.  Does not need transfusion at this point.  Hypotension: He became hypotensive this morning.  We will hold all his home antihypertensives.  History of coronary artery disease/MI: Stable.  Denies any chest pain.  Plavix on hold.  Diabetes Mellitus: Continue sliding scale insulin.  Hypertension: Ongoing hypotension.  History of congestive heart failure: No prior echo report on the system.  Status post pacemaker.  On Entresto which is on hold.  Currently heart failure is compensated.IV fluids discontinued.  History of morbid obesity/OSA: Declined CPAP    DVT prophylaxis: SCD Code Status: Full Family Communication: Family member present at the bedside Disposition Plan: Home after colonoscopy, clearance by GI   Consultants: GI  Procedures:  None Antimicrobials: None  Subjective: Patient seen and examined the bedside this morning.  Remains comfortable.  Denies any abdominal pain, nausea or vomiting.  Denies any rectal bleed.  Blood pressure on the lower side this morning.  Objective: Vitals:   10/23/17 2109 10/24/17 0500 10/24/17 0845 10/24/17 1055  BP: (!) 126/56 (!) 109/50  (!) 98/42  Pulse: 68 65  68  Resp:      Temp: 98 F (36.7 C) 98.6 F (37 C) 99.4 F (37.4 C)   TempSrc: Oral Oral Oral   SpO2: 99% 97%    Weight:      Height:        Intake/Output Summary (Last 24 hours) at 10/24/2017 1113 Last data filed at 10/24/2017 1100 Gross per 24 hour  Intake 700 ml  Output 825 ml  Net -125 ml   Filed Weights   10/21/17 2157 10/22/17 0704  Weight: 115.2 kg 117.9 kg    Examination:  General exam: Appears calm and comfortable ,Not in distress,morbidly obese HEENT:PERRL,Oral mucosa moist, Ear/Nose normal on gross exam Respiratory system: Bilateral equal air entry, normal vesicular breath sounds, no wheezes or crackles  Cardiovascular system: S1 & S2 heard, RRR. No JVD, murmurs, rubs, gallops or clicks. Gastrointestinal system: Abdomen is nondistended, soft and nontender. No organomegaly or masses felt. Normal bowel sounds heard. Central nervous system: Alert and oriented. No focal neurological deficits. Extremities: No edema, no clubbing ,no cyanosis, distal peripheral pulses palpable. Skin: No rashes, lesions or ulcers,no icterus ,no pallor MSK: Normal muscle bulk,tone ,power Psychiatry: Judgement and insight appear normal. Mood & affect appropriate.       Data Reviewed: I have personally  reviewed following labs and imaging studies  CBC: Recent Labs  Lab 10/21/17 2206 10/22/17 0212 10/22/17 1136 10/22/17 1903 10/23/17 0258 10/24/17 0204  WBC 7.2  --   --   --  6.3 7.2  NEUTROABS  --   --   --   --   --  4.3  HGB 12.8* 12.2* 11.6* 10.5* 9.9* 10.1*  HCT 37.9* 36.1* 35.8* 31.9* 30.4* 31.2*  MCV 95.5   --   --   --  97.1 97.8  PLT 277  --   --   --  220 700   Basic Metabolic Panel: Recent Labs  Lab 10/21/17 2206 10/22/17 1136 10/23/17 0258  NA 139  --  139  K 4.6  --  3.9  CL 104  --  106  CO2 26  --  23  GLUCOSE 133*  --  128*  BUN 18  --  12  CREATININE 1.06  --  0.99  CALCIUM 9.0  --  8.6*  MG  --  1.7  --   PHOS  --  3.5  --    GFR: Estimated Creatinine Clearance: 80.3 mL/min (by C-G formula based on SCr of 0.99 mg/dL). Liver Function Tests: Recent Labs  Lab 10/21/17 2206  AST 19  ALT 16  ALKPHOS 64  BILITOT 0.6  PROT 7.7  ALBUMIN 3.6   No results for input(s): LIPASE, AMYLASE in the last 168 hours. No results for input(s): AMMONIA in the last 168 hours. Coagulation Profile: No results for input(s): INR, PROTIME in the last 168 hours. Cardiac Enzymes: Recent Labs  Lab 10/22/17 1136 10/22/17 1500  TROPONINI 0.03* 0.03*   BNP (last 3 results) No results for input(s): PROBNP in the last 8760 hours. HbA1C: No results for input(s): HGBA1C in the last 72 hours. CBG: Recent Labs  Lab 10/23/17 1631 10/23/17 1959 10/23/17 2356 10/24/17 0412 10/24/17 0905  GLUCAP 139* 187* 89 115* 120*   Lipid Profile: No results for input(s): CHOL, HDL, LDLCALC, TRIG, CHOLHDL, LDLDIRECT in the last 72 hours. Thyroid Function Tests: No results for input(s): TSH, T4TOTAL, FREET4, T3FREE, THYROIDAB in the last 72 hours. Anemia Panel: No results for input(s): VITAMINB12, FOLATE, FERRITIN, TIBC, IRON, RETICCTPCT in the last 72 hours. Sepsis Labs: No results for input(s): PROCALCITON, LATICACIDVEN in the last 168 hours.  No results found for this or any previous visit (from the past 240 hour(s)).       Radiology Studies: Ct Abdomen Pelvis W Contrast  Result Date: 10/22/2017 CLINICAL DATA:  Bloody stool and diarrhea for 2 days. EXAM: CT ABDOMEN AND PELVIS WITH CONTRAST TECHNIQUE: Multidetector CT imaging of the abdomen and pelvis was performed using the standard  protocol following bolus administration of intravenous contrast. CONTRAST:  175mL ISOVUE-300 IOPAMIDOL (ISOVUE-300) INJECTION 61% COMPARISON:  Body CT 02/06/2015 FINDINGS: Lower chest: No acute abnormality. Pacemaker leads partially visualized. Hepatobiliary: Cholelithiasis.  Normal appearance of the liver. Pancreas: Unremarkable. No pancreatic ductal dilatation or surrounding inflammatory changes. Spleen: Normal in size without focal abnormality. Adrenals/Urinary Tract: Bilateral hypertrophy of the adrenal glands. Bilateral too small to be actually characterized hypoattenuated circumscribed lesions within the kidneys. No nephrolithiasis or hydronephrosis. Normal urinary bladder. Stomach/Bowel: Stomach is within normal limits. Appendix appears normal. No evidence of small bowel wall thickening, distention, or inflammatory changes. Diffuse left colonic diverticulosis. Mild pericolonic fat stranding along the distal descending colon may represent chronic diverticulitis. The rectum is smooth and featureless. Vascular/Lymphatic: Aortic atherosclerosis. No enlarged abdominal or pelvic lymph nodes. Reproductive:  Prostate is unremarkable. Other: No abdominal wall hernia. No abdominopelvic ascites. Subcutaneous thickening in the bilateral lower abdominal wall, likely iatrogenic. Musculoskeletal: Spondylosis of the lumbosacral spine. Sclerotic changes of the right sacroiliac joint, chronic. IMPRESSION: Diffuse left colonic diverticulosis with possible chronic diverticulitis of the descending colon. Featureless appearance of the rectum, likely inflammatory/infectious. Cholelithiasis. Electronically Signed   By: Fidela Salisbury M.D.   On: 10/22/2017 17:15        Scheduled Meds: . sodium chloride   Intravenous Once  . atorvastatin  40 mg Oral Daily  . DULoxetine  30 mg Oral Daily  . gabapentin  300 mg Oral TID  . insulin aspart  0-9 Units Subcutaneous Q4H  . lurasidone  20 mg Oral Q breakfast  . multivitamin  with minerals  1 tablet Oral Daily  . pantoprazole  40 mg Oral Daily  . primidone  50 mg Oral BID  . sacubitril-valsartan  1 tablet Oral BID  . tamsulosin  0.8 mg Oral QHS  . traZODone  100 mg Oral QHS   Continuous Infusions:   LOS: 2 days    Time spent:25 mins. More than 50% of that time was spent in counseling and/or coordination of care.      Shelly Coss, MD [facility] Pager [phone]  If 7PM-7AM, please contact night-coverage [email or web address] Password [email or web address] 10/24/2017, 11:13 AM

## 2017-10-25 LAB — CBC WITH DIFFERENTIAL/PLATELET
Abs Immature Granulocytes: 0 10*3/uL (ref 0.0–0.1)
Basophils Absolute: 0 10*3/uL (ref 0.0–0.1)
Basophils Relative: 0 %
Eosinophils Absolute: 0.2 10*3/uL (ref 0.0–0.7)
Eosinophils Relative: 3 %
HCT: 30.2 % — ABNORMAL LOW (ref 39.0–52.0)
Hemoglobin: 9.8 g/dL — ABNORMAL LOW (ref 13.0–17.0)
Immature Granulocytes: 0 %
Lymphocytes Relative: 17 %
Lymphs Abs: 1.2 10*3/uL (ref 0.7–4.0)
MCH: 31.5 pg (ref 26.0–34.0)
MCHC: 32.5 g/dL (ref 30.0–36.0)
MCV: 97.1 fL (ref 78.0–100.0)
Monocytes Absolute: 0.6 10*3/uL (ref 0.1–1.0)
Monocytes Relative: 9 %
Neutro Abs: 5 10*3/uL (ref 1.7–7.7)
Neutrophils Relative %: 71 %
Platelets: 205 10*3/uL (ref 150–400)
RBC: 3.11 MIL/uL — ABNORMAL LOW (ref 4.22–5.81)
RDW: 13.6 % (ref 11.5–15.5)
WBC: 7.2 10*3/uL (ref 4.0–10.5)

## 2017-10-25 LAB — GLUCOSE, CAPILLARY
Glucose-Capillary: 113 mg/dL — ABNORMAL HIGH (ref 70–99)
Glucose-Capillary: 123 mg/dL — ABNORMAL HIGH (ref 70–99)
Glucose-Capillary: 125 mg/dL — ABNORMAL HIGH (ref 70–99)
Glucose-Capillary: 132 mg/dL — ABNORMAL HIGH (ref 70–99)
Glucose-Capillary: 140 mg/dL — ABNORMAL HIGH (ref 70–99)
Glucose-Capillary: 142 mg/dL — ABNORMAL HIGH (ref 70–99)
Glucose-Capillary: 158 mg/dL — ABNORMAL HIGH (ref 70–99)

## 2017-10-25 MED ORDER — DULOXETINE HCL 60 MG PO CPEP
60.0000 mg | ORAL_CAPSULE | Freq: Every day | ORAL | Status: DC
  Administered 2017-10-26: 60 mg via ORAL
  Filled 2017-10-25: qty 1

## 2017-10-25 MED ORDER — LURASIDONE HCL 40 MG PO TABS
40.0000 mg | ORAL_TABLET | Freq: Every day | ORAL | Status: DC
  Administered 2017-10-26: 40 mg via ORAL
  Filled 2017-10-25: qty 1

## 2017-10-25 MED ORDER — PEG 3350-KCL-NA BICARB-NACL 420 G PO SOLR
4000.0000 mL | Freq: Once | ORAL | Status: AC
  Administered 2017-10-25: 4000 mL via ORAL
  Filled 2017-10-25: qty 4000

## 2017-10-25 NOTE — Care Management Important Message (Signed)
Important Message  Patient Details  Name: Johnny Ochoa. MRN: [id number] Date of Birth: [birth year]   Medicare Important Message Given:  Yes    Alyan Hartline 10/25/2017, 4:01 PM

## 2017-10-25 NOTE — Progress Notes (Signed)
PROGRESS NOTE    Johnny Ochoa.  KZL:[id number] DOB: 1945-12-24 DOA: 10/21/2017 PCP: Jani Gravel, MD   Brief Narrative: Patient is a 72 year old with past medical history of morbid obesity, coronary artery disease, hypertension, diabetic mellitus, peripheral neuropathy, congestive heart failure, diverticulosis who presented to the emergency department with 1 day history of hematochezia.  Also complained of diarrhea.  No history of use of NSAIDs.  Hemoglobin was stable on presentation.  GI consulted.  Plan for colonoscopy tomorrow.  Assessment & Plan:   Active Problems:   Blood in stool   Liver cirrhosis (HCC)   GI bleed  Hematochezia: Hemoglobin this morning 9.8.    Denies any rectal bleed this morning.  Denies any abdominal pain, nausea or vomiting. CT abdomen/pelvis  showed chronic diverticulosis of the descending colon, left diffuse colonic diverticulosis.  Most likely this is a source of hematochezia. GI following.  Plan for colonoscopy tomorrow.  Could not do colonoscopy soon because he was on Plavix.  Currently on clear liquid diet.  His last colonoscopy/EGD was about 4 years ago.  Acute blood loss anemia: Hemoglobin this morning 9.8.  Does not need transfusion at this point.  Hypertension: Home medications currently on hold due to soft blood pressure.  History of coronary artery disease/MI: Stable.  Denies any chest pain.  Plavix on hold.  Diabetes Mellitus: Continue sliding scale insulin.  History of congestive heart failure: No prior echo report on the system.  Status post pacemaker.  On Entresto which is on hold due to low BP,will resume on DC.  Currently heart failure is compensated.IV fluids discontinued.  History of morbid obesity/OSA: Declined CPAP    DVT prophylaxis: SCD Code Status: Full Family Communication: None present at the bedside Disposition Plan: Home after colonoscopy, clearance by GI   Consultants: GI  Procedures: None Antimicrobials:  None  Subjective: Patient seen and examined the bedside this morning.  Remains comfortable.  No new episodes of hematochezia.  Hemodynamically stable.  Plan for colonoscopy tomorrow.  Objective: Vitals:   10/24/17 1319 10/24/17 2109 10/25/17 0440 10/25/17 1000  BP: (!) 100/47 (!) 119/56 116/88 (!) 123/50  Pulse: 69  69 77  Resp: 18 18 18 17   Temp: 98.9 F (37.2 C) 98.7 F (37.1 C) 99.1 F (37.3 C) 98.2 F (36.8 C)  TempSrc: Oral Oral Oral Oral  SpO2: 97% 100% 98% 98%  Weight:      Height:        Intake/Output Summary (Last 24 hours) at 10/25/2017 1208 Last data filed at 10/25/2017 0735 Gross per 24 hour  Intake 432 ml  Output 1700 ml  Net -1268 ml   Filed Weights   10/21/17 2157 10/22/17 0704  Weight: 115.2 kg 117.9 kg    Examination:  General exam: Appears calm and comfortable ,Not in distress,obese HEENT:PERRL,Oral mucosa moist, Ear/Nose normal on gross exam Respiratory system: Bilateral equal air entry, normal vesicular breath sounds, no wheezes or crackles  Cardiovascular system: S1 & S2 heard, RRR. No JVD, murmurs, rubs, gallops or clicks. Gastrointestinal system: Abdomen is nondistended, soft and nontender. No organomegaly or masses felt. Normal bowel sounds heard. Central nervous system: Alert and oriented. No focal neurological deficits. Extremities: No edema, no clubbing ,no cyanosis, distal peripheral pulses palpable. Skin: No rashes, lesions or ulcers,no icterus ,no pallor   Data Reviewed: I have personally reviewed following labs and imaging studies  CBC: Recent Labs  Lab 10/21/17 2206  10/22/17 1136 10/22/17 1903 10/23/17 0258 10/24/17 3903 10/25/17 0092  WBC 7.2  --   --   --  6.3 7.2 7.2  NEUTROABS  --   --   --   --   --  4.3 5.0  HGB 12.8*   < > 11.6* 10.5* 9.9* 10.1* 9.8*  HCT 37.9*   < > 35.8* 31.9* 30.4* 31.2* 30.2*  MCV 95.5  --   --   --  97.1 97.8 97.1  PLT 277  --   --   --  220 220 205   < > = values in this interval not displayed.    Basic Metabolic Panel: Recent Labs  Lab 10/21/17 2206 10/22/17 1136 10/23/17 0258  NA 139  --  139  K 4.6  --  3.9  CL 104  --  106  CO2 26  --  23  GLUCOSE 133*  --  128*  BUN 18  --  12  CREATININE 1.06  --  0.99  CALCIUM 9.0  --  8.6*  MG  --  1.7  --   PHOS  --  3.5  --    GFR: Estimated Creatinine Clearance: 80.3 mL/min (by C-G formula based on SCr of 0.99 mg/dL). Liver Function Tests: Recent Labs  Lab 10/21/17 2206  AST 19  ALT 16  ALKPHOS 64  BILITOT 0.6  PROT 7.7  ALBUMIN 3.6   No results for input(s): LIPASE, AMYLASE in the last 168 hours. No results for input(s): AMMONIA in the last 168 hours. Coagulation Profile: No results for input(s): INR, PROTIME in the last 168 hours. Cardiac Enzymes: Recent Labs  Lab 10/22/17 1136 10/22/17 1500  TROPONINI 0.03* 0.03*   BNP (last 3 results) No results for input(s): PROBNP in the last 8760 hours. HbA1C: No results for input(s): HGBA1C in the last 72 hours. CBG: Recent Labs  Lab 10/24/17 1552 10/24/17 2038 10/25/17 0027 10/25/17 0440 10/25/17 0811  GLUCAP 183* 136* 113* 123* 125*   Lipid Profile: No results for input(s): CHOL, HDL, LDLCALC, TRIG, CHOLHDL, LDLDIRECT in the last 72 hours. Thyroid Function Tests: No results for input(s): TSH, T4TOTAL, FREET4, T3FREE, THYROIDAB in the last 72 hours. Anemia Panel: No results for input(s): VITAMINB12, FOLATE, FERRITIN, TIBC, IRON, RETICCTPCT in the last 72 hours. Sepsis Labs: No results for input(s): PROCALCITON, LATICACIDVEN in the last 168 hours.  No results found for this or any previous visit (from the past 240 hour(s)).       Radiology Studies: No results found.      Scheduled Meds: . sodium chloride   Intravenous Once  . atorvastatin  40 mg Oral Daily  . DULoxetine  30 mg Oral Daily  . gabapentin  300 mg Oral TID  . insulin aspart  0-9 Units Subcutaneous Q4H  . lurasidone  20 mg Oral Q breakfast  . multivitamin with minerals  1  tablet Oral Daily  . pantoprazole  40 mg Oral Daily  . polyethylene glycol-electrolytes  4,000 mL Oral Once  . primidone  50 mg Oral BID  . tamsulosin  0.8 mg Oral QHS  . traZODone  100 mg Oral QHS   Continuous Infusions:   LOS: 3 days    Time spent:25 mins. More than 50% of that time was spent in counseling and/or coordination of care.      Shelly Coss, MD [facility] Pager [phone]  If 7PM-7AM, please contact night-coverage [email or web address] Password [email or web address] 10/25/2017, 12:08 PM

## 2017-10-25 NOTE — Progress Notes (Signed)
Pt refused CPAP for the past several nights, RT available if pt changes mind.

## 2017-10-25 NOTE — Progress Notes (Signed)
The patient is doing well today with no further signs of rectal bleeding. We are going to prep him today for colonoscopy tomorrow.

## 2017-10-26 ENCOUNTER — Encounter (HOSPITAL_COMMUNITY): Payer: Self-pay | Admitting: *Deleted

## 2017-10-26 ENCOUNTER — Inpatient Hospital Stay (HOSPITAL_COMMUNITY): Payer: Medicare Other | Admitting: Certified Registered Nurse Anesthetist

## 2017-10-26 ENCOUNTER — Encounter (HOSPITAL_COMMUNITY)
Admission: EM | Disposition: A | Discharge status: Home / Self Care | Payer: Self-pay | Source: Home / Self Care | Attending: Internal Medicine

## 2017-10-26 DIAGNOSIS — K921 Melena: Secondary | ICD-10-CM

## 2017-10-26 DIAGNOSIS — K922 Gastrointestinal hemorrhage, unspecified: Secondary | ICD-10-CM

## 2017-10-26 HISTORY — PX: COLONOSCOPY WITH PROPOFOL: SHX5780

## 2017-10-26 LAB — GLUCOSE, CAPILLARY
Glucose-Capillary: 104 mg/dL — ABNORMAL HIGH (ref 70–99)
Glucose-Capillary: 126 mg/dL — ABNORMAL HIGH (ref 70–99)
Glucose-Capillary: 128 mg/dL — ABNORMAL HIGH (ref 70–99)
Glucose-Capillary: 76 mg/dL (ref 70–99)

## 2017-10-26 LAB — BPAM RBC
Blood Product Expiration Date: 201910052359
Blood Product Expiration Date: 201910052359
Unit Type and Rh: 5100
Unit Type and Rh: 5100

## 2017-10-26 LAB — CBC WITH DIFFERENTIAL/PLATELET
Abs Immature Granulocytes: 0 10*3/uL (ref 0.0–0.1)
Basophils Absolute: 0 10*3/uL (ref 0.0–0.1)
Basophils Relative: 0 %
Eosinophils Absolute: 0.2 10*3/uL (ref 0.0–0.7)
Eosinophils Relative: 4 %
HCT: 29.1 % — ABNORMAL LOW (ref 39.0–52.0)
Hemoglobin: 9.6 g/dL — ABNORMAL LOW (ref 13.0–17.0)
Immature Granulocytes: 0 %
Lymphocytes Relative: 18 %
Lymphs Abs: 1.2 10*3/uL (ref 0.7–4.0)
MCH: 32 pg (ref 26.0–34.0)
MCHC: 33 g/dL (ref 30.0–36.0)
MCV: 97 fL (ref 78.0–100.0)
Monocytes Absolute: 0.6 10*3/uL (ref 0.1–1.0)
Monocytes Relative: 9 %
Neutro Abs: 4.7 10*3/uL (ref 1.7–7.7)
Neutrophils Relative %: 69 %
Platelets: 198 10*3/uL (ref 150–400)
RBC: 3 MIL/uL — ABNORMAL LOW (ref 4.22–5.81)
RDW: 13.8 % (ref 11.5–15.5)
WBC: 6.8 10*3/uL (ref 4.0–10.5)

## 2017-10-26 LAB — TYPE AND SCREEN
ABO/RH(D): O POS
Antibody Screen: POSITIVE
DAT, IgG: POSITIVE
Unit division: 0
Unit division: 0

## 2017-10-26 SURGERY — COLONOSCOPY WITH PROPOFOL
Anesthesia: Monitor Anesthesia Care

## 2017-10-26 MED ORDER — ADULT MULTIVITAMIN W/MINERALS CH: 1.0000 | ORAL_TABLET | Freq: Every day | ORAL | Status: DC

## 2017-10-26 MED ORDER — MAGNESIUM OXIDE 400 (241.3 MG) MG PO TABS
400.0000 mg | ORAL_TABLET | Freq: Every day | ORAL | Status: DC
  Administered 2017-10-26: 400 mg via ORAL
  Filled 2017-10-26: qty 1

## 2017-10-26 MED ORDER — MAGNESIUM 400 MG PO TABS: 400.0000 mg | ORAL_TABLET | Freq: Every day | ORAL | Status: DC

## 2017-10-26 MED ORDER — SODIUM CHLORIDE 0.9 % IV SOLN: INTRAVENOUS | Status: DC

## 2017-10-26 MED ORDER — PROMETHAZINE HCL 25 MG/ML IJ SOLN: 6.2500 mg | INTRAMUSCULAR | Status: DC | PRN

## 2017-10-26 MED ORDER — LACTATED RINGERS IV SOLN
INTRAVENOUS | Status: AC | PRN
  Administered 2017-10-26: 1000 mL via INTRAVENOUS

## 2017-10-26 MED ORDER — OXYCODONE-ACETAMINOPHEN 7.5-325 MG PO TABS: 1.0000 | ORAL_TABLET | Freq: Four times a day (QID) | ORAL | Status: DC | PRN

## 2017-10-26 MED ORDER — PROPOFOL 500 MG/50ML IV EMUL
INTRAVENOUS | Status: DC | PRN
  Administered 2017-10-26: 60 ug/kg/min via INTRAVENOUS

## 2017-10-26 MED ORDER — ETOMIDATE 2 MG/ML IV SOLN
INTRAVENOUS | Status: DC | PRN
  Administered 2017-10-26 (×2): 4 mg via INTRAVENOUS

## 2017-10-26 MED ORDER — POTASSIUM CHLORIDE CRYS ER 10 MEQ PO TBCR
10.0000 meq | EXTENDED_RELEASE_TABLET | Freq: Every day | ORAL | Status: DC
  Administered 2017-10-26: 10 meq via ORAL
  Filled 2017-10-26: qty 1

## 2017-10-26 MED ORDER — MEPERIDINE HCL 25 MG/ML IJ SOLN: 6.2500 mg | INTRAMUSCULAR | Status: DC | PRN

## 2017-10-26 MED ORDER — MIDAZOLAM HCL 2 MG/2ML IJ SOLN: 0.5000 mg | Freq: Once | INTRAMUSCULAR | Status: DC | PRN

## 2017-10-26 SURGICAL SUPPLY — 22 items

## 2017-10-26 NOTE — Anesthesia Postprocedure Evaluation (Signed)
Anesthesia Post Note  Patient: Evander Macaraeg.  Procedure(s) Performed: COLONOSCOPY WITH PROPOFOL (N/A )     Patient location during evaluation: Endoscopy Anesthesia Type: MAC Level of consciousness: awake and alert, oriented and patient cooperative Pain management: pain level controlled Vital Signs Assessment: post-procedure vital signs reviewed and stable Respiratory status: spontaneous breathing, nonlabored ventilation and respiratory function stable Cardiovascular status: blood pressure returned to baseline and stable Postop Assessment: no apparent nausea or vomiting Anesthetic complications: no    Last Vitals:  Vitals:   10/26/17 1000 10/26/17 1030  BP: (!) 125/47 (!) 130/51  Pulse: 69 67  Resp: 10   Temp:  36.8 C  SpO2: 100% 100%    Last Pain:  Vitals:   10/26/17 1030  TempSrc: Oral  PainSc:                  Thedore Pickel,E. Temeca Somma

## 2017-10-26 NOTE — Anesthesia Procedure Notes (Signed)
Procedure Name: MAC Performed by: Kaylena Pacifico B, CRNA Pre-anesthesia Checklist: Patient identified, Emergency Drugs available, Suction available, Patient being monitored and Timeout performed Patient Re-evaluated:Patient Re-evaluated prior to induction Oxygen Delivery Method: Simple face mask Preoxygenation: Pre-oxygenation with 100% oxygen Placement Confirmation: positive ETCO2 Dental Injury: Teeth and Oropharynx as per pre-operative assessment        

## 2017-10-26 NOTE — Plan of Care (Signed)
  Problem: Education: Goal: Knowledge of General Education information will improve Description Including pain rating scale, medication(s)/side effects and non-pharmacologic comfort measures Outcome: Progressing   Problem: Health Behavior/Discharge Planning: Goal: Ability to manage health-related needs will improve Outcome: Progressing   Problem: Clinical Measurements: Goal: Ability to maintain clinical measurements within normal limits will improve Outcome: Progressing Goal: Will remain free from infection Outcome: Progressing Goal: Diagnostic test results will improve Outcome: Progressing Goal: Respiratory complications will improve Outcome: Progressing Goal: Cardiovascular complication will be avoided Outcome: Progressing Goal: Will remain free from infection Outcome: Progressing   Problem: Activity: Goal: Risk for activity intolerance will decrease Outcome: Progressing   Problem: Nutrition: Goal: Adequate nutrition will be maintained Outcome: Progressing   Problem: Coping: Goal: Level of anxiety will decrease Outcome: Progressing   Problem: Elimination: Goal: Will not experience complications related to bowel motility Outcome: Progressing Goal: Will not experience complications related to urinary retention Outcome: Progressing   Problem: Pain Managment: Goal: General experience of comfort will improve Outcome: Progressing   Problem: Safety: Goal: Ability to remain free from injury will improve Outcome: Progressing   Problem: Skin Integrity: Goal: Risk for impaired skin integrity will decrease Outcome: Progressing

## 2017-10-26 NOTE — Anesthesia Preprocedure Evaluation (Addendum)
Anesthesia Evaluation  Patient identified by MRN, date of birth, ID band Patient awake    Reviewed: Allergy & Precautions, NPO status , Patient's Chart, lab work & pertinent test results, reviewed documented beta blocker date and time   History of Anesthesia Complications Negative for: history of anesthetic complications  Airway Mallampati: II  TM Distance: >3 FB Neck ROM: Full    Dental  (+) Edentulous Upper, Edentulous Lower   Pulmonary sleep apnea and Continuous Positive Airway Pressure Ventilation , COPD,  COPD inhaler, former smoker (quit 1982),    breath sounds clear to auscultation       Cardiovascular hypertension, Pt. on medications and Pt. on home beta blockers + CAD, + Cardiac Stents and +CHF  + pacemaker + Cardiac Defibrillator (shocked patient in March)  Rhythm:Regular Rate:Normal  '17 stress: Rest and stress perfusion images were reviewed and compared and showed moderate area of mainly fixed defect in the inferolateral wall with no evidence of ischemia or redistribution, Severe LV systolic dysfunction, EF 36%   Neuro/Psych PSYCHIATRIC DISORDERS (PTSD) Anxiety Chronic back pain    GI/Hepatic Neg liver ROS, GERD  Medicated and Controlled,  Endo/Other  diabetes (glu 128), Insulin DependentMorbid obesity  Renal/GU H/o stones   Prostate cancer    Musculoskeletal   Abdominal (+) + obese,   Peds  Hematology  (+) Blood dyscrasia (Hb 9.6), anemia , plavix   Anesthesia Other Findings   Reproductive/Obstetrics                            Anesthesia Physical Anesthesia Plan  ASA: IV  Anesthesia Plan: MAC   Post-op Pain Management:    Induction: Intravenous  PONV Risk Score and Plan: 1 and Treatment may vary due to age or medical condition  Airway Management Planned: Natural Airway and Simple Face Mask  Additional Equipment:   Intra-op Plan:   Post-operative Plan:    Informed Consent: I have reviewed the patients History and Physical, chart, labs and discussed the procedure including the risks, benefits and alternatives for the proposed anesthesia with the patient or authorized representative who has indicated his/her understanding and acceptance.     Plan Discussed with: CRNA and Surgeon  Anesthesia Plan Comments: (Plan routine monitors, MAC)        Anesthesia Quick Evaluation

## 2017-10-26 NOTE — Progress Notes (Signed)
Johnny Ochoa. to be D/C'd Home per MD order.  Discussed with the patient and all questions fully answered.  VSS, Skin clean, dry and intact without evidence of skin break down, no evidence of skin tears noted. IV catheter discontinued intact. Site without signs and symptoms of complications. Dressing and pressure applied.  An After Visit Summary was printed and given to the patient. Patient received prescription.  D/c education completed with patient/family including follow up instructions, medication list, d/c activities limitations if indicated, with other d/c instructions as indicated by MD - patient able to verbalize understanding, all questions fully answered.   Patient instructed to return to ED, call 911, or call MD for any changes in condition.   Patient escorted via [place], and D/C home via private auto.  Johnny Ochoa 10/26/2017 3:48 PM

## 2017-10-26 NOTE — H&P (Signed)
  Patient here for colonoscopy because of rectal bleeding.  PE: no distress, Heart RRR,Lungs clear, Abdomen non tender. Imp: rectal bleeding Plan: colonoscopy

## 2017-10-26 NOTE — Discharge Summary (Signed)
Physician Discharge Summary  Johnny Ochoa. OEV:[id number] DOB: 1945/12/19 DOA: 10/21/2017  PCP: Johnny Gravel, MD  Admit date: 10/21/2017 Discharge date: 10/26/2017  Time spent: >35 minutes  Recommendations for Outpatient Follow-up:  F/u with PCP in 5-7 days, recheck Hg F/u with cardiology in 1 week Discharge Diagnoses:  Active Problems:   Blood in stool   Liver cirrhosis (HCC)   GI bleed   Discharge Condition: stable   Diet recommendation: stable low sodium, carb modified     Filed Weights   10/21/17 2157 10/22/17 0704 10/26/17 0804  Weight: 115.2 kg 117.9 kg 115.2 kg    History of present illness:   72 year old with past medical history of morbid obesity, coronary artery disease, hypertension, diabetic mellitus, peripheral neuropathy, congestive heart failure, diverticulosis who presented to the emergency department with 1 day history of hematochezia.  Also complained of diarrhea.  No history of use of NSAIDs.  Hemoglobin was stable on presentation.  GI consulted and underwent colonoscopy which showed no acute bleeding   Hospital Course:   Hematochezia: Hemoglobin remained stable. No new episodes of rectal bleed.  Denies any abdominal pain, nausea or vomiting. CT abdomen/pelvis  showed chronic diverticulosis of the descending colon, left diffuse colonic diverticulosis.  Most likely this is a source of hematochezia. Underwent colonoscopy which showed diverticulosis, no active bleeding.  Acute blood loss anemia: Hg is stable   Hypertension:resume  Home medications   History of coronary artery disease/MI: Stable.  Denies any chest pain.  he reports history of stent many years ago. I have recommended to f/u with his cardiologist to discuss further antiplatelet needs.  Diabetes Mellitus: resume home insulin regimen   History of congestive heart failure: No prior echo report on the system.  Status post pacemaker.  Currently heart failure is compensated. Resume home regimen    History of morbid obesity/OSA: Declined CPAP  Procedures:  Colonoscopy  (i.e. Studies not automatically included, echos, thoracentesis, etc; not x-rays)  Consultations:  GI  Discharge Exam: Vitals:   10/26/17 1000 10/26/17 1030  BP: (!) 125/47 (!) 130/51  Pulse: 69 67  Resp: 10   Temp:  98.2 F (36.8 C)  SpO2: 100% 100%    General: no distress.  Cardiovascular: s1,s2 rrr Respiratory: CTA BL  Discharge Instructions  Discharge Instructions    Diet - low sodium heart healthy   Complete by:  As directed    Discharge instructions   Complete by:  As directed    Please follow up with primary care physician in 5-7 days to recheck Hemoglobin   Increase activity slowly   Complete by:  As directed      Allergies as of 10/26/2017      Reactions   Metformin Diarrhea      Medication List    TAKE these medications   acetaminophen 325 MG tablet Commonly known as:  TYLENOL Take 650 mg by mouth 3 (three) times daily as needed (pain).   albuterol 108 (90 Base) MCG/ACT inhaler Commonly known as:  PROVENTIL HFA;VENTOLIN HFA Inhale 2 puffs into the lungs every 4 (four) hours as needed for wheezing or shortness of breath.   aspirin EC 81 MG tablet Take 81 mg by mouth daily.   atorvastatin 80 MG tablet Commonly known as:  LIPITOR Take 40 mg by mouth at bedtime.   clopidogrel 75 MG tablet Commonly known as:  PLAVIX Take 75 mg by mouth daily.   docusate sodium 100 MG capsule Commonly known as:  COLACE Take  100 mg by mouth 2 (two) times daily.   DULoxetine 60 MG capsule Commonly known as:  CYMBALTA Take 60 mg by mouth daily.   ENTRESTO 49-51 MG Generic drug:  sacubitril-valsartan Take 1 tablet 2 (two) times daily by mouth.   furosemide 40 MG tablet Commonly known as:  LASIX Take 1 tablet (40 mg total) by mouth daily. Take an extra dose if you gain 2 lbs in 24 hours. What changed:    when to take this  additional instructions   gabapentin 400 MG  capsule Commonly known as:  NEURONTIN Take 400 mg by mouth 3 (three) times daily.   insulin aspart 100 UNIT/ML FlexPen Commonly known as:  NOVOLOG Inject 25 Units into the skin 2 (two) times daily with a meal.   Insulin Glargine 100 UNIT/ML Solostar Pen Commonly known as:  LANTUS Inject 48-50 Units into the skin See admin instructions. Inject 50 units subcutaneously every morning and 48 units at night   isosorbide mononitrate 30 MG 24 hr tablet Commonly known as:  IMDUR Take 30 mg daily by mouth.   lurasidone 40 MG Tabs tablet Commonly known as:  LATUDA Take 40 mg by mouth daily with breakfast.   Magnesium 400 MG Tabs Take 400 mg by mouth daily.   metoprolol 200 MG 24 hr tablet Commonly known as:  TOPROL-XL Take 100 mg by mouth daily. Take with or immediately following a meal.   multivitamin with minerals Tabs tablet Take 1 tablet by mouth daily.   nitroGLYCERIN 0.4 MG SL tablet Commonly known as:  NITROSTAT Place 0.4 mg under the tongue every 5 (five) minutes as needed for chest pain.   omeprazole 20 MG capsule Commonly known as:  PRILOSEC Take 40 mg by mouth daily.   oxyCODONE-acetaminophen 7.5-325 MG tablet Commonly known as:  PERCOCET Take 1 tablet by mouth every 6 (six) hours as needed (pain).   polyethylene glycol packet Commonly known as:  MIRALAX / GLYCOLAX Take 17 g by mouth daily as needed (constipation).   potassium chloride 10 MEQ tablet Commonly known as:  K-DUR,KLOR-CON Take 10 mEq by mouth daily.   primidone 50 MG tablet Commonly known as:  MYSOLINE Take 50 mg by mouth 2 (two) times daily.   spironolactone 25 MG tablet Commonly known as:  ALDACTONE Take 25 mg by mouth daily.   tamsulosin 0.4 MG Caps capsule Commonly known as:  FLOMAX Take 0.8 mg by mouth at bedtime.   terbinafine 1 % cream Commonly known as:  LAMISIL Apply 1 application topically daily as needed for itching.   traZODone 100 MG tablet Commonly known as:  DESYREL Take  100 mg by mouth at bedtime.      Allergies  Allergen Reactions  . Metformin Diarrhea      The results of significant diagnostics from this hospitalization (including imaging, microbiology, ancillary and laboratory) are listed below for reference.    Significant Diagnostic Studies: Dg Chest 2 View  Result Date: 10/22/2017 CLINICAL DATA:  Rectal bleeding for 2 days. Shortness of breath and diarrhea. History of diabetes, hypertension, heart disease, former smoker. EXAM: CHEST - 2 VIEW COMPARISON:  11/17/2016 FINDINGS: Cardiac pacemaker. Mild hyperinflation suggesting emphysematous changes. Scattered fibrosis in the lungs suggesting chronic bronchitis. No airspace disease or consolidation. No blunting of costophrenic angles. No pneumothorax. Mediastinal contours appear intact. Degenerative changes in the spine. IMPRESSION: Emphysematous and chronic bronchitic changes in the lungs. No evidence of active pulmonary disease. Electronically Signed   By: Oren Beckmann.D.  On: 10/22/2017 01:28   Ct Abdomen Pelvis W Contrast  Result Date: 10/22/2017 CLINICAL DATA:  Bloody stool and diarrhea for 2 days. EXAM: CT ABDOMEN AND PELVIS WITH CONTRAST TECHNIQUE: Multidetector CT imaging of the abdomen and pelvis was performed using the standard protocol following bolus administration of intravenous contrast. CONTRAST:  150mL ISOVUE-300 IOPAMIDOL (ISOVUE-300) INJECTION 61% COMPARISON:  Body CT 02/06/2015 FINDINGS: Lower chest: No acute abnormality. Pacemaker leads partially visualized. Hepatobiliary: Cholelithiasis.  Normal appearance of the liver. Pancreas: Unremarkable. No pancreatic ductal dilatation or surrounding inflammatory changes. Spleen: Normal in size without focal abnormality. Adrenals/Urinary Tract: Bilateral hypertrophy of the adrenal glands. Bilateral too small to be actually characterized hypoattenuated circumscribed lesions within the kidneys. No nephrolithiasis or hydronephrosis. Normal urinary  bladder. Stomach/Bowel: Stomach is within normal limits. Appendix appears normal. No evidence of small bowel wall thickening, distention, or inflammatory changes. Diffuse left colonic diverticulosis. Mild pericolonic fat stranding along the distal descending colon may represent chronic diverticulitis. The rectum is smooth and featureless. Vascular/Lymphatic: Aortic atherosclerosis. No enlarged abdominal or pelvic lymph nodes. Reproductive: Prostate is unremarkable. Other: No abdominal wall hernia. No abdominopelvic ascites. Subcutaneous thickening in the bilateral lower abdominal wall, likely iatrogenic. Musculoskeletal: Spondylosis of the lumbosacral spine. Sclerotic changes of the right sacroiliac joint, chronic. IMPRESSION: Diffuse left colonic diverticulosis with possible chronic diverticulitis of the descending colon. Featureless appearance of the rectum, likely inflammatory/infectious. Cholelithiasis. Electronically Signed   By: Fidela Salisbury M.D.   On: 10/22/2017 17:15    Microbiology: No results found for this or any previous visit (from the past 240 hour(s)).   Labs: Basic Metabolic Panel: Recent Labs  Lab 10/21/17 2206 10/22/17 1136 10/23/17 0258  NA 139  --  139  K 4.6  --  3.9  CL 104  --  106  CO2 26  --  23  GLUCOSE 133*  --  128*  BUN 18  --  12  CREATININE 1.06  --  0.99  CALCIUM 9.0  --  8.6*  MG  --  1.7  --   PHOS  --  3.5  --    Liver Function Tests: Recent Labs  Lab 10/21/17 2206  AST 19  ALT 16  ALKPHOS 64  BILITOT 0.6  PROT 7.7  ALBUMIN 3.6   No results for input(s): LIPASE, AMYLASE in the last 168 hours. No results for input(s): AMMONIA in the last 168 hours. CBC: Recent Labs  Lab 10/21/17 2206  10/22/17 1903 10/23/17 0258 10/24/17 0204 10/25/17 0348 10/26/17 0509  WBC 7.2  --   --  6.3 7.2 7.2 6.8  NEUTROABS  --   --   --   --  4.3 5.0 4.7  HGB 12.8*   < > 10.5* 9.9* 10.1* 9.8* 9.6*  HCT 37.9*   < > 31.9* 30.4* 31.2* 30.2* 29.1*  MCV  95.5  --   --  97.1 97.8 97.1 97.0  PLT 277  --   --  220 220 205 198   < > = values in this interval not displayed.   Cardiac Enzymes: Recent Labs  Lab 10/22/17 1136 10/22/17 1500  TROPONINI 0.03* 0.03*   BNP: BNP (last 3 results) Recent Labs    10/21/17 2206  BNP 32.6    ProBNP (last 3 results) No results for input(s): PROBNP in the last 8760 hours.  CBG: Recent Labs  Lab 10/25/17 2038 10/26/17 0011 10/26/17 0421 10/26/17 0804 10/26/17 1127  GLUCAP 132* 126* 128* 104* 76  SignedKinnie Feil  [facility] 10/26/2017, 12:55 PM

## 2017-10-26 NOTE — Op Note (Signed)
[facility] Patient Name: Johnny Ochoa Procedure Date : 10/26/2017 MRN: [id number] Attending MD: Wonda Horner , MD Date of Birth: [birth year] CSN: [id number] Age: 72 Admit Type: Inpatient Procedure:                Colonoscopy Indications:              Rectal bleeding Providers:                Wonda Horner, MD, Cherylynn Ridges, Technician,                            Edmonia James, CRNA Referring MD:              Medicines:                Monitored Anesthesia Care Complications:            No immediate complications. Estimated Blood Loss:     Estimated blood loss: none. Procedure:                Pre-Anesthesia Assessment:                           - Prior to the procedure, a History and Physical                            was performed, and patient medications and                            allergies were reviewed. The patient's tolerance of                            previous anesthesia was also reviewed. The risks                            and benefits of the procedure and the sedation                            options and risks were discussed with the patient.                            All questions were answered, and informed consent                            was obtained. Prior Anticoagulants: The patient has                            taken no previous anticoagulant or antiplatelet                            agents. ASA Grade Assessment: III - A patient with                            severe systemic disease. After reviewing the risks  and benefits, the patient was deemed in                            satisfactory condition to undergo the procedure.                           After obtaining informed consent, the colonoscope                            was passed under direct vision. Throughout the                            procedure, the patient's blood pressure, pulse, and                            oxygen saturations were monitored  continuously. The                            [id number]-[id number] ([id number]) peds colon was introduced                            through the anus and advanced to the the cecum,                            identified by appendiceal orifice and ileocecal                            valve. The ileocecal valve, appendiceal orifice,                            and rectum were photographed. The colonoscopy was                            performed without difficulty. The patient tolerated                            the procedure well. The quality of the bowel                            preparation was adequate. Scope In: 9:28:09 AM Scope Out: 9:41:39 AM Scope Withdrawal Time: 0 hours 7 minutes 54 seconds  Total Procedure Duration: 0 hours 13 minutes 30 seconds  Findings:      The perianal and digital rectal examinations were normal.      Multiple small and large-mouthed diverticula were found in the sigmoid       colon and descending colon.      The exam was otherwise without abnormality. Impression:               - Diverticulosis in the sigmoid colon and in the                            descending colon.                           - The  examination was otherwise normal.                           - No specimens collected.                           - Believe lower GI bleed was due to diverticulosis. Recommendation:           - Resume regular diet.                           - Continue present medications.                           - Repeat colonoscopy is not recommended for                            screening purposes.                           - Can go home from a GI standpoint. Procedure Code(s):        --- Professional ---                           [phone], Colonoscopy, flexible; diagnostic, including                            collection of specimen(s) by brushing or washing,                            when performed (separate procedure) Diagnosis Code(s):        --- Professional ---                            K62.5, Hemorrhage of anus and rectum                           K57.30, Diverticulosis of large intestine without                            perforation or abscess without bleeding CPT copyright 2017 American [employer] Association. All rights reserved. The codes documented in this report are preliminary and upon coder review may  be revised to meet current compliance requirements. Wonda Horner, MD 10/26/2017 9:47:20 AM This report has been signed electronically. Number of Addenda: 0

## 2017-10-26 NOTE — Consult Note (Signed)
   [facility] CM Inpatient Consult   10/26/2017  Johnny Ochoa. 1945/11/26 [id number]  Patient had previous history with [facility] Care Management.  HX with HF, DM2 and now with GI Bleeding with HX of cirrhoisis. Met with patient and spouse at the bedside regarding [facility] services for post hospital follow up.  Patient endorses Dr. Jani Gravel as his primary care provider.  Patient denies issues with medications, get them from the [employer]. He states he has no issues with transportation. He states no need for resources.  Patient did accept a brochure with 24 hour nurse advise line.  Patient has [employer]. Patient with flat affect states, no needs.  Patient currently declines services. For questions or changes contact:  Natividad Brood, RN BSN [facility] Liaison  [phone] business mobile phone Toll free office [phone]

## 2017-10-26 NOTE — Transfer of Care (Signed)
Immediate Anesthesia Transfer of Care Note  Patient: Johnny Ochoa.  Procedure(s) Performed: COLONOSCOPY WITH PROPOFOL (N/A )  Patient Location: Endoscopy Unit  Anesthesia Type:MAC  Level of Consciousness: awake, alert  and oriented  Airway & Oxygen Therapy: Patient Spontanous Breathing and Patient connected to face mask oxygen  Post-op Assessment: Report given to RN and Post -op Vital signs reviewed and stable  Post vital signs: Reviewed and stable  Last Vitals:  Vitals Value Taken Time  BP 96/43 10/26/2017  9:48 AM  Temp    Pulse 70 10/26/2017  9:49 AM  Resp 20 10/26/2017  9:49 AM  SpO2 100 % 10/26/2017  9:49 AM  Vitals shown include unvalidated device data.  Last Pain:  Vitals:   10/26/17 0804  TempSrc: Oral  PainSc: 0-No pain      Patients Stated Pain Goal: 0 (73/53/29 9242)  Complications: No apparent anesthesia complications

## 2017-10-27 ENCOUNTER — Other Ambulatory Visit: Payer: Self-pay

## 2017-10-27 ENCOUNTER — Encounter (HOSPITAL_COMMUNITY): Payer: Self-pay | Admitting: Gastroenterology

## 2017-10-27 NOTE — Consult Note (Signed)
Patient has declined services and updated status.  Natividad Brood, RN BSN [facility] Liaison  [phone] business mobile phone Toll free office [phone]

## 2017-11-08 DIAGNOSIS — I10 Essential (primary) hypertension: Secondary | ICD-10-CM | POA: Diagnosis not present

## 2017-11-08 DIAGNOSIS — E118 Type 2 diabetes mellitus with unspecified complications: Secondary | ICD-10-CM | POA: Diagnosis not present

## 2017-11-15 DIAGNOSIS — E118 Type 2 diabetes mellitus with unspecified complications: Secondary | ICD-10-CM | POA: Diagnosis not present

## 2017-11-15 DIAGNOSIS — I10 Essential (primary) hypertension: Secondary | ICD-10-CM | POA: Diagnosis not present

## 2017-12-15 DIAGNOSIS — D591 Other autoimmune hemolytic anemias: Secondary | ICD-10-CM | POA: Diagnosis not present

## 2018-01-27 DIAGNOSIS — E118 Type 2 diabetes mellitus with unspecified complications: Secondary | ICD-10-CM | POA: Diagnosis not present

## 2018-01-27 DIAGNOSIS — I10 Essential (primary) hypertension: Secondary | ICD-10-CM | POA: Diagnosis not present

## 2018-01-28 DIAGNOSIS — E782 Mixed hyperlipidemia: Secondary | ICD-10-CM | POA: Diagnosis not present

## 2018-01-28 DIAGNOSIS — I255 Ischemic cardiomyopathy: Secondary | ICD-10-CM | POA: Diagnosis not present

## 2018-01-28 DIAGNOSIS — R001 Bradycardia, unspecified: Secondary | ICD-10-CM | POA: Diagnosis not present

## 2018-01-28 DIAGNOSIS — Z95810 Presence of automatic (implantable) cardiac defibrillator: Secondary | ICD-10-CM | POA: Diagnosis not present

## 2018-01-31 DIAGNOSIS — M1712 Unilateral primary osteoarthritis, left knee: Secondary | ICD-10-CM | POA: Insufficient documentation

## 2018-02-03 DIAGNOSIS — I2510 Atherosclerotic heart disease of native coronary artery without angina pectoris: Secondary | ICD-10-CM | POA: Diagnosis not present

## 2018-02-03 DIAGNOSIS — E1142 Type 2 diabetes mellitus with diabetic polyneuropathy: Secondary | ICD-10-CM | POA: Diagnosis not present

## 2018-02-03 DIAGNOSIS — I10 Essential (primary) hypertension: Secondary | ICD-10-CM | POA: Diagnosis not present

## 2018-02-03 DIAGNOSIS — E118 Type 2 diabetes mellitus with unspecified complications: Secondary | ICD-10-CM | POA: Diagnosis not present

## 2018-02-03 DIAGNOSIS — Z23 Encounter for immunization: Secondary | ICD-10-CM | POA: Diagnosis not present

## 2018-03-11 ENCOUNTER — Encounter (HOSPITAL_BASED_OUTPATIENT_CLINIC_OR_DEPARTMENT_OTHER): Payer: Self-pay | Admitting: Emergency Medicine

## 2018-03-11 ENCOUNTER — Other Ambulatory Visit: Payer: Self-pay

## 2018-03-11 ENCOUNTER — Emergency Department (HOSPITAL_BASED_OUTPATIENT_CLINIC_OR_DEPARTMENT_OTHER)
Admission: EM | Admit: 2018-03-11 | Discharge: 2018-03-11 | Disposition: A | Discharge status: Home / Self Care | Payer: Medicare Other | Attending: Emergency Medicine | Admitting: Emergency Medicine

## 2018-03-11 DIAGNOSIS — Z955 Presence of coronary angioplasty implant and graft: Secondary | ICD-10-CM | POA: Insufficient documentation

## 2018-03-11 DIAGNOSIS — I509 Heart failure, unspecified: Secondary | ICD-10-CM | POA: Insufficient documentation

## 2018-03-11 DIAGNOSIS — Z79899 Other long term (current) drug therapy: Secondary | ICD-10-CM | POA: Insufficient documentation

## 2018-03-11 DIAGNOSIS — Z950 Presence of cardiac pacemaker: Secondary | ICD-10-CM | POA: Diagnosis not present

## 2018-03-11 DIAGNOSIS — Z7982 Long term (current) use of aspirin: Secondary | ICD-10-CM | POA: Diagnosis not present

## 2018-03-11 DIAGNOSIS — Z87891 Personal history of nicotine dependence: Secondary | ICD-10-CM | POA: Diagnosis not present

## 2018-03-11 DIAGNOSIS — E162 Hypoglycemia, unspecified: Secondary | ICD-10-CM | POA: Diagnosis not present

## 2018-03-11 DIAGNOSIS — Z7902 Long term (current) use of antithrombotics/antiplatelets: Secondary | ICD-10-CM | POA: Insufficient documentation

## 2018-03-11 DIAGNOSIS — I110 Hypertensive heart disease with heart failure: Secondary | ICD-10-CM | POA: Insufficient documentation

## 2018-03-11 DIAGNOSIS — R4781 Slurred speech: Secondary | ICD-10-CM | POA: Diagnosis present

## 2018-03-11 DIAGNOSIS — Z794 Long term (current) use of insulin: Secondary | ICD-10-CM | POA: Insufficient documentation

## 2018-03-11 DIAGNOSIS — Z96651 Presence of right artificial knee joint: Secondary | ICD-10-CM | POA: Insufficient documentation

## 2018-03-11 DIAGNOSIS — E119 Type 2 diabetes mellitus without complications: Secondary | ICD-10-CM | POA: Insufficient documentation

## 2018-03-11 DIAGNOSIS — E11649 Type 2 diabetes mellitus with hypoglycemia without coma: Secondary | ICD-10-CM | POA: Diagnosis not present

## 2018-03-11 LAB — BASIC METABOLIC PANEL
Anion gap: 6 (ref 5–15)
BUN: 13 mg/dL (ref 8–23)
CO2: 26 mmol/L (ref 22–32)
Calcium: 9 mg/dL (ref 8.9–10.3)
Chloride: 107 mmol/L (ref 98–111)
Creatinine, Ser: 0.93 mg/dL (ref 0.61–1.24)
GFR calc Af Amer: >60 mL/min (ref >60)
GFR calc non Af Amer: >60 mL/min (ref >60)
Glucose, Bld: 41 mg/dL — CL (ref 70–99)
Potassium: 3.4 mmol/L — ABNORMAL LOW (ref 3.5–5.1)
Sodium: 139 mmol/L (ref 135–145)

## 2018-03-11 LAB — CBC WITH DIFFERENTIAL/PLATELET
Abs Immature Granulocytes: 0.01 10*3/uL (ref 0.00–0.07)
Basophils Absolute: 0 10*3/uL (ref 0.0–0.1)
Basophils Relative: 0 %
Eosinophils Absolute: 0.2 10*3/uL (ref 0.0–0.5)
Eosinophils Relative: 3 %
HCT: 40.8 % (ref 39.0–52.0)
Hemoglobin: 12.8 g/dL — ABNORMAL LOW (ref 13.0–17.0)
Immature Granulocytes: 0 %
Lymphocytes Relative: 25 %
Lymphs Abs: 1.4 10*3/uL (ref 0.7–4.0)
MCH: 30 pg (ref 26.0–34.0)
MCHC: 31.4 g/dL (ref 30.0–36.0)
MCV: 95.8 fL (ref 80.0–100.0)
Monocytes Absolute: 0.6 10*3/uL (ref 0.1–1.0)
Monocytes Relative: 10 %
Neutro Abs: 3.5 10*3/uL (ref 1.7–7.7)
Neutrophils Relative %: 62 %
Platelets: 203 10*3/uL (ref 150–400)
RBC: 4.26 MIL/uL (ref 4.22–5.81)
RDW: 15.5 % (ref 11.5–15.5)
WBC: 5.7 10*3/uL (ref 4.0–10.5)
nRBC: 0 % (ref 0.0–0.2)

## 2018-03-11 LAB — URINALYSIS, ROUTINE W REFLEX MICROSCOPIC
Bilirubin Urine: NEGATIVE
Glucose, UA: NEGATIVE mg/dL
Hgb urine dipstick: NEGATIVE
Ketones, ur: NEGATIVE mg/dL
Leukocytes, UA: NEGATIVE
Nitrite: NEGATIVE
Protein, ur: NEGATIVE mg/dL
Specific Gravity, Urine: 1.02 (ref 1.005–1.030)
pH: 6 (ref 5.0–8.0)

## 2018-03-11 LAB — CBG MONITORING, ED
Glucose-Capillary: 26 mg/dL — CL (ref 70–99)
Glucose-Capillary: 128 mg/dL — ABNORMAL HIGH (ref 70–99)
Glucose-Capillary: 126 mg/dL — ABNORMAL HIGH (ref 70–99)

## 2018-03-11 MED ORDER — DEXTROSE 50 % IV SOLN
50.0000 mL | Freq: Once | INTRAVENOUS | Status: AC
  Administered 2018-03-11: 50 mL via INTRAVENOUS
  Filled 2018-03-11: qty 50

## 2018-03-11 NOTE — ED Provider Notes (Addendum)
[place] [place] DEPT [facility] Provider Note: Johnny Spurling, MD, FACEP  CSN: [id number] MRN: [id number] ARRIVAL: 03/11/18 at [place]: [place] Speech  Level 5 caveat: Altered mental status HISTORY OF PRESENT ILLNESS  03/11/18 3:24 AM Johnny Spurr. is a 73 y.o. male with a history of diabetes.  He was last seen normal about 12:30 AM. He awoke at 2:30 AM with confusion and slurred speech.  His wife did not note any focal weakness.  On arrival he continues to be altered and is very drowsy although he is able to answer questions slowly.  CBG was noted to be 26.   Past Medical History:  Diagnosis Date  . Cancer [facility])    prostate  (Radiation only)  . CHF (congestive heart failure) ([place])   . Diabetes mellitus   . Hypertension   . Kidney stones   . Leg swelling   . Low back pain   . Myocardial infarction ([place]) 12/09/1993  . Obese   . OSA (obstructive sleep apnea)   . Peripheral neuropathy   . PTSD (post-traumatic stress disorder)     Past Surgical History:  Procedure Laterality Date  . CARDIAC DEFIBRILLATOR PLACEMENT    . COLONOSCOPY WITH PROPOFOL N/A 10/26/2017   Procedure: COLONOSCOPY WITH PROPOFOL;  Surgeon: Wonda Horner, MD;  Location: [facility] ENDOSCOPY;  Service: Endoscopy;  Laterality: N/A;  . CORONARY ANGIOPLASTY WITH STENT PLACEMENT  1995    x 3 stents  . JOINT REPLACEMENT  2004   right total knee replacement  . MULTIPLE TOOTH EXTRACTIONS     Total Tooth extraction  . PACEMAKER INSERTION    . PACEMAKER PLACEMENT      Family History  Problem Relation Age of Onset  . Heart attack Mother   . Diabetes Other   . Hypertension Other   . Heart disease Other   . Arthritis Other     Social History   Tobacco Use  . Smoking status: Former Smoker    Packs/day: 1.00    Years: 20.00    Pack years: 20.00    Types: Cigarettes    Last attempt to quit: 10/23/1980    Years since quitting: 37.4  . Smokeless tobacco: Never Used  Substance Use  Topics  . Alcohol use: No    Alcohol/week: 0.0 standard drinks  . Drug use: No    Prior to Admission medications   Medication Sig Start Date End Date Taking? Authorizing Provider  acetaminophen (TYLENOL) 325 MG tablet Take 650 mg by mouth 3 (three) times daily as needed (pain).     [provider]  albuterol (PROVENTIL HFA;VENTOLIN HFA) 108 (90 BASE) MCG/ACT inhaler Inhale 2 puffs into the lungs every 4 (four) hours as needed for wheezing or shortness of breath. 08/24/13   Orpah Greek, MD  aspirin EC 81 MG tablet Take 81 mg by mouth daily.    [provider]  atorvastatin (LIPITOR) 80 MG tablet Take 40 mg by mouth at bedtime.     [provider]  clopidogrel (PLAVIX) 75 MG tablet Take 75 mg by mouth daily. 02/16/16   [provider]  docusate sodium (COLACE) 100 MG capsule Take 100 mg by mouth 2 (two) times daily.    [provider]  DULoxetine (CYMBALTA) 60 MG capsule Take 60 mg by mouth daily.     [provider]  furosemide (LASIX) 40 MG tablet Take 1 tablet (40 mg total) by mouth daily. Take an  extra dose if you gain 2 lbs in 24 hours. Patient taking differently: Take 40 mg by mouth See admin instructions. Take one tablet (40 mg) by mouth every morning, take an extra dose if you gain 2 lbs in 24 hours. 02/28/16   Rama, Venetia Maxon, MD  gabapentin (NEURONTIN) 400 MG capsule Take 400 mg by mouth 3 (three) times daily.     [provider]  insulin aspart (NOVOLOG) 100 UNIT/ML FlexPen Inject 25 Units into the skin 2 (two) times daily with a meal.     [provider]  Insulin Glargine (LANTUS) 100 UNIT/ML Solostar Pen Inject 48-50 Units into the skin See admin instructions. Inject 50 units subcutaneously every morning and 48 units at night    [provider]  isosorbide mononitrate (IMDUR) 30 MG 24 hr tablet Take 30 mg daily by mouth.    [provider]  lurasidone (LATUDA) 40 MG TABS tablet Take 40  mg by mouth daily with breakfast.     [provider]  Magnesium 400 MG TABS Take 400 mg by mouth daily.    [provider]  metoprolol (TOPROL-XL) 200 MG 24 hr tablet Take 100 mg by mouth daily. Take with or immediately following a meal.     [provider]  Multiple Vitamin (MULTIVITAMIN WITH MINERALS) TABS tablet Take 1 tablet by mouth daily.    [provider]  nitroGLYCERIN (NITROSTAT) 0.4 MG SL tablet Place 0.4 mg under the tongue every 5 (five) minutes as needed for chest pain.     [provider]  omeprazole (PRILOSEC) 20 MG capsule Take 40 mg by mouth daily.     [provider]  oxyCODONE-acetaminophen (PERCOCET) 7.5-325 MG tablet Take 1 tablet by mouth every 6 (six) hours as needed (pain).    [provider]  polyethylene glycol (MIRALAX / GLYCOLAX) packet Take 17 g by mouth daily as needed (constipation).     [provider]  potassium chloride (K-DUR,KLOR-CON) 10 MEQ tablet Take 10 mEq by mouth daily.    [provider]  primidone (MYSOLINE) 50 MG tablet Take 50 mg by mouth 2 (two) times daily.     [provider]  sacubitril-valsartan (ENTRESTO) 49-51 MG Take 1 tablet 2 (two) times daily by mouth.    [provider]  spironolactone (ALDACTONE) 25 MG tablet Take 25 mg by mouth daily. 02/16/16   [provider]  tamsulosin (FLOMAX) 0.4 MG CAPS capsule Take 0.8 mg by mouth at bedtime.     [provider]  terbinafine (LAMISIL) 1 % cream Apply 1 application topically daily as needed for itching.    [provider]  traZODone (DESYREL) 100 MG tablet Take 100 mg by mouth at bedtime.    [provider]    Allergies Metformin   REVIEW OF SYSTEMS     PHYSICAL EXAMINATION  Initial Vital Signs Blood pressure 129/67, pulse (!) 59, temperature 97.9 F (36.6 C), temperature source Oral, resp. rate 16, height 5\' 6"  (1.676 m), weight 122.9 kg, SpO2 95  %.  Examination General: Well-developed, obese male in no acute distress; appearance consistent with age of record HENT: normocephalic; atraumatic Eyes: pupils equal, round and reactive to light; extraocular muscles intact; bilateral pseudophakia Neck: supple Heart: regular rate and rhythm Lungs: clear to auscultation bilaterally AICD left upper chest Abdomen: soft; nondistended; nontender; bowel sounds present Extremities: No deformity; full range of motion; pulses normal Neurologic: Awake but lethargic; oriented to person, place and year but  slow to answer questions; dysarthria; motor function intact in all extremities and symmetric; no facial droop Skin: Warm and dry; chronic appearing changes of lower legs Psychiatric: Flat affect   RESULTS  Summary of this visit's results, reviewed by myself:   EKG Interpretation  Date/Time:    Ventricular Rate:    PR Interval:    QRS Duration:   QT Interval:    QTC Calculation:   R Axis:     Text Interpretation:        Laboratory Studies: Results for orders placed or performed during the hospital encounter of 03/11/18 (from the past 24 hour(s))  CBG monitoring, ED     Status: Abnormal   Collection Time: 03/11/18  3:22 AM  Result Value Ref Range   Glucose-Capillary 26 (LL) 70 - 99 mg/dL  Basic metabolic panel     Status: Abnormal   Collection Time: 03/11/18  3:28 AM  Result Value Ref Range   Sodium 139 135 - 145 mmol/L   Potassium 3.4 (L) 3.5 - 5.1 mmol/L   Chloride 107 98 - 111 mmol/L   CO2 26 22 - 32 mmol/L   Glucose, Bld 41 (LL) 70 - 99 mg/dL   BUN 13 8 - 23 mg/dL   Creatinine, Ser 0.93 0.61 - 1.24 mg/dL   Calcium 9.0 8.9 - 10.3 mg/dL   GFR calc non Af Amer >60 >60 mL/min   GFR calc Af Amer >60 >60 mL/min   Anion gap 6 5 - 15  CBC with Differential/Platelet     Status: Abnormal   Collection Time: 03/11/18  3:28 AM  Result Value Ref Range   WBC 5.7 4.0 - 10.5 K/uL   RBC 4.26 4.22 - 5.81 MIL/uL   Hemoglobin 12.8 (L)  13.0 - 17.0 g/dL   HCT 40.8 39.0 - 52.0 %   MCV 95.8 80.0 - 100.0 fL   MCH 30.0 26.0 - 34.0 pg   MCHC 31.4 30.0 - 36.0 g/dL   RDW 15.5 11.5 - 15.5 %   Platelets 203 150 - 400 K/uL   nRBC 0.0 0.0 - 0.2 %   Neutrophils Relative % 62 %   Neutro Abs 3.5 1.7 - 7.7 K/uL   Lymphocytes Relative 25 %   Lymphs Abs 1.4 0.7 - 4.0 K/uL   Monocytes Relative 10 %   Monocytes Absolute 0.6 0.1 - 1.0 K/uL   Eosinophils Relative 3 %   Eosinophils Absolute 0.2 0.0 - 0.5 K/uL   Basophils Relative 0 %   Basophils Absolute 0.0 0.0 - 0.1 K/uL   Immature Granulocytes 0 %   Abs Immature Granulocytes 0.01 0.00 - 0.07 K/uL  POC CBG, ED     Status: Abnormal   Collection Time: 03/11/18  3:59 AM  Result Value Ref Range   Glucose-Capillary 128 (H) 70 - 99 mg/dL  Urinalysis, Routine w reflex microscopic     Status: None   Collection Time: 03/11/18  4:43 AM  Result Value Ref Range   Color, Urine YELLOW YELLOW   APPearance CLEAR CLEAR   Specific Gravity, Urine 1.020 1.005 - 1.030   pH 6.0 5.0 - 8.0   Glucose, UA NEGATIVE NEGATIVE mg/dL   Hgb urine dipstick NEGATIVE NEGATIVE   Bilirubin Urine NEGATIVE NEGATIVE   Ketones, ur NEGATIVE NEGATIVE mg/dL   Protein, ur NEGATIVE NEGATIVE mg/dL   Nitrite NEGATIVE NEGATIVE   Leukocytes, UA NEGATIVE NEGATIVE  CBG monitoring, ED     Status: Abnormal   Collection Time: 03/11/18  5:04  AM  Result Value Ref Range   Glucose-Capillary 126 (H) 70 - 99 mg/dL   Imaging Studies: No results found.  ED COURSE and MDM  Nursing notes and initial vitals signs, including pulse oximetry, reviewed.  Vitals:   03/11/18 0318 03/11/18 0325  BP:  129/67  Pulse:  (!) 59  Resp:  16  Temp:  97.9 F (36.6 C)  TempSrc:  Oral  SpO2:  95%  Weight: 122.9 kg   Height: 5\' 6"  (1.676 m)    5:07 AM Patient given D50 50 g IV soon after arrival with improvement in his mental status.  He was then given something to eat.  He has been sleeping but is readily arousable.  His blood sugar has  been stable for the past hour.  He was told to continue his anti-hyperglycemics as prescribed but to be sure to eat regular meals as well.  PROCEDURES    ED DIAGNOSES     ICD-10-CM   1. Hypoglycemia E16.2        Shanon Rosser, MD 03/11/18 [id number]    Shanon Rosser, MD 03/11/18 [id number]

## 2018-03-11 NOTE — ED Triage Notes (Signed)
Dr. Molpus at bedside. 

## 2018-03-11 NOTE — ED Triage Notes (Signed)
Wife states pt went to bed at [place] and awoke at 0230 with confusion per wife. Pt is A/O x 4 on arrival to ED. Family states pt is acting very drowsy. Pt CBG 26. Pt able to drink 1 cup orange juice. Pt does not have any focal weakness.

## 2018-03-11 NOTE — ED Notes (Signed)
Date and time results received: 03/11/18 0404 (use smartphrase ".now" to insert current time)  Test: glucose Critical Value: 21  Name of Provider Notified: Dr. Florina Ou  Orders Received? Or Actions Taken?: pt has already been treated with IV dextrose

## 2018-04-06 DIAGNOSIS — Z95810 Presence of automatic (implantable) cardiac defibrillator: Secondary | ICD-10-CM | POA: Diagnosis not present

## 2018-04-20 ENCOUNTER — Other Ambulatory Visit: Payer: Self-pay

## 2018-04-20 ENCOUNTER — Emergency Department (HOSPITAL_BASED_OUTPATIENT_CLINIC_OR_DEPARTMENT_OTHER)
Admission: EM | Admit: 2018-04-20 | Discharge: 2018-04-20 | Disposition: A | Discharge status: Home / Self Care | Payer: Medicare Other | Attending: Emergency Medicine | Admitting: Emergency Medicine

## 2018-04-20 ENCOUNTER — Emergency Department (HOSPITAL_BASED_OUTPATIENT_CLINIC_OR_DEPARTMENT_OTHER): Payer: Medicare Other

## 2018-04-20 ENCOUNTER — Encounter (HOSPITAL_BASED_OUTPATIENT_CLINIC_OR_DEPARTMENT_OTHER): Payer: Self-pay

## 2018-04-20 DIAGNOSIS — M10071 Idiopathic gout, right ankle and foot: Secondary | ICD-10-CM | POA: Diagnosis not present

## 2018-04-20 DIAGNOSIS — Z7982 Long term (current) use of aspirin: Secondary | ICD-10-CM | POA: Diagnosis not present

## 2018-04-20 DIAGNOSIS — Z794 Long term (current) use of insulin: Secondary | ICD-10-CM | POA: Diagnosis not present

## 2018-04-20 DIAGNOSIS — E1140 Type 2 diabetes mellitus with diabetic neuropathy, unspecified: Secondary | ICD-10-CM | POA: Diagnosis not present

## 2018-04-20 DIAGNOSIS — Z79899 Other long term (current) drug therapy: Secondary | ICD-10-CM | POA: Diagnosis not present

## 2018-04-20 DIAGNOSIS — Z95810 Presence of automatic (implantable) cardiac defibrillator: Secondary | ICD-10-CM | POA: Diagnosis not present

## 2018-04-20 DIAGNOSIS — Z87891 Personal history of nicotine dependence: Secondary | ICD-10-CM | POA: Insufficient documentation

## 2018-04-20 DIAGNOSIS — I5022 Chronic systolic (congestive) heart failure: Secondary | ICD-10-CM | POA: Insufficient documentation

## 2018-04-20 DIAGNOSIS — I110 Hypertensive heart disease with heart failure: Secondary | ICD-10-CM | POA: Diagnosis not present

## 2018-04-20 DIAGNOSIS — M79671 Pain in right foot: Secondary | ICD-10-CM | POA: Diagnosis not present

## 2018-04-20 MED ORDER — COLCHICINE 0.6 MG PO TABS: 0.6000 mg | ORAL_TABLET | Freq: Every day | ORAL | 0 refills | Status: DC

## 2018-04-20 MED ORDER — HYDROCODONE-ACETAMINOPHEN 5-325 MG PO TABS: 1.0000 | ORAL_TABLET | Freq: Four times a day (QID) | ORAL | 0 refills | Status: DC | PRN

## 2018-04-20 MED ORDER — INDOMETHACIN 25 MG PO CAPS: 25.0000 mg | ORAL_CAPSULE | Freq: Three times a day (TID) | ORAL | 0 refills | Status: DC | PRN

## 2018-04-20 NOTE — Discharge Instructions (Signed)
Return here as needed.  Follow-up with your primary doctor.  The x-rays did not show any abnormalities.  Ice and elevate your foot and toe.

## 2018-04-20 NOTE — ED Triage Notes (Signed)
C/o right foot pain x 4 days-denies injury- to triage in w/c-NAD

## 2018-04-20 NOTE — ED Provider Notes (Signed)
[place] EMERGENCY DEPARTMENT Provider Note   CSN: [id number] Arrival date & time: 04/20/18  1402    History   Chief Complaint Chief Complaint  Patient presents with  . Foot Pain    HPI Johnny Ochoa. is a 73 y.o. male.     HPI Patient presents to the emergency department with right great toe pain.  Patient states the pain started 4 days ago.  Patient states that the pain is pretty significant.  Patient denies any other injuries or skin issues on the foot. Past Medical History:  Diagnosis Date  . Cancer [facility])    prostate  (Radiation only)  . CHF (congestive heart failure) ([place])   . Diabetes mellitus   . Hypertension   . Kidney stones   . Leg swelling   . Low back pain   . Myocardial infarction ([place]) 12/09/1993  . Obese   . OSA (obstructive sleep apnea)   . Peripheral neuropathy   . PTSD (post-traumatic stress disorder)     Patient Active Problem List   Diagnosis Date Noted  . Blood in stool 10/22/2017  . Liver cirrhosis ([place]) 10/22/2017  . GI bleed 10/22/2017  . Diabetic neuropathy ([place]) 02/26/2016  . Essential hypertension 02/26/2016  . Coronary artery disease 02/26/2016  . Hyperlipidemia 02/26/2016  . Elevated troponin 02/26/2016  . Morbid obesity ([place]) 02/26/2016  . Depression with anxiety 02/26/2016  . Hypokalemia 02/26/2016  . Cardiac pacemaker in situ 02/26/2016  . CAP (community acquired pneumonia) 02/25/2016  . Multifocal pneumonia 02/25/2016  . Acute respiratory failure with hypoxia ([place]) 02/25/2016  . Acute on chronic systolic CHF (congestive heart failure) ([place]) 06/23/2014  . Uncontrolled diabetes mellitus with complications ([place]) 33/00/7622  . Anemia 11/30/2013    Past Surgical History:  Procedure Laterality Date  . CARDIAC DEFIBRILLATOR PLACEMENT    . COLONOSCOPY WITH PROPOFOL N/A 10/26/2017   Procedure: COLONOSCOPY WITH PROPOFOL;  Surgeon: Wonda Horner, MD;  Location: [facility] ENDOSCOPY;  Service: Endoscopy;  Laterality: N/A;    . CORONARY ANGIOPLASTY WITH STENT PLACEMENT  1995    x 3 stents  . JOINT REPLACEMENT  2004   right total knee replacement  . MULTIPLE TOOTH EXTRACTIONS     Total Tooth extraction  . PACEMAKER INSERTION    . PACEMAKER PLACEMENT          Home Medications    Prior to Admission medications   Medication Sig Start Date End Date Taking? Authorizing Provider  acetaminophen (TYLENOL) 325 MG tablet Take 650 mg by mouth 3 (three) times daily as needed (pain).     [provider]  albuterol (PROVENTIL HFA;VENTOLIN HFA) 108 (90 BASE) MCG/ACT inhaler Inhale 2 puffs into the lungs every 4 (four) hours as needed for wheezing or shortness of breath. 08/24/13   Orpah Greek, MD  aspirin EC 81 MG tablet Take 81 mg by mouth daily.    [provider]  atorvastatin (LIPITOR) 80 MG tablet Take 40 mg by mouth at bedtime.     [provider]  clopidogrel (PLAVIX) 75 MG tablet Take 75 mg by mouth daily. 02/16/16   [provider]  docusate sodium (COLACE) 100 MG capsule Take 100 mg by mouth 2 (two) times daily.    [provider]  DULoxetine (CYMBALTA) 60 MG capsule Take 60 mg by mouth daily.     [provider]  furosemide (LASIX) 40 MG tablet Take 1 tablet (40 mg total) by mouth daily. Take an extra dose  if you gain 2 lbs in 24 hours. Patient taking differently: Take 40 mg by mouth See admin instructions. Take one tablet (40 mg) by mouth every morning, take an extra dose if you gain 2 lbs in 24 hours. 02/28/16   Rama, Venetia Maxon, MD  gabapentin (NEURONTIN) 400 MG capsule Take 400 mg by mouth 3 (three) times daily.     [provider]  insulin aspart (NOVOLOG) 100 UNIT/ML FlexPen Inject 25 Units into the skin 2 (two) times daily with a meal.     [provider]  Insulin Glargine (LANTUS) 100 UNIT/ML Solostar Pen Inject 48-50 Units into the skin See admin instructions. Inject 50 units subcutaneously every morning and 48 units at  night    [provider]  isosorbide mononitrate (IMDUR) 30 MG 24 hr tablet Take 30 mg daily by mouth.    [provider]  lurasidone (LATUDA) 40 MG TABS tablet Take 40 mg by mouth daily with breakfast.     [provider]  Magnesium 400 MG TABS Take 400 mg by mouth daily.    [provider]  metoprolol (TOPROL-XL) 200 MG 24 hr tablet Take 100 mg by mouth daily. Take with or immediately following a meal.     [provider]  Multiple Vitamin (MULTIVITAMIN WITH MINERALS) TABS tablet Take 1 tablet by mouth daily.    [provider]  nitroGLYCERIN (NITROSTAT) 0.4 MG SL tablet Place 0.4 mg under the tongue every 5 (five) minutes as needed for chest pain.     [provider]  omeprazole (PRILOSEC) 20 MG capsule Take 40 mg by mouth daily.     [provider]  oxyCODONE-acetaminophen (PERCOCET) 7.5-325 MG tablet Take 1 tablet by mouth every 6 (six) hours as needed (pain).    [provider]  polyethylene glycol (MIRALAX / GLYCOLAX) packet Take 17 g by mouth daily as needed (constipation).     [provider]  potassium chloride (K-DUR,KLOR-CON) 10 MEQ tablet Take 10 mEq by mouth daily.    [provider]  primidone (MYSOLINE) 50 MG tablet Take 50 mg by mouth 2 (two) times daily.     [provider]  sacubitril-valsartan (ENTRESTO) 49-51 MG Take 1 tablet 2 (two) times daily by mouth.    [provider]  spironolactone (ALDACTONE) 25 MG tablet Take 25 mg by mouth daily. 02/16/16   [provider]  tamsulosin (FLOMAX) 0.4 MG CAPS capsule Take 0.8 mg by mouth at bedtime.     [provider]  terbinafine (LAMISIL) 1 % cream Apply 1 application topically daily as needed for itching.    [provider]  traZODone (DESYREL) 100 MG tablet Take 100 mg by mouth at bedtime.    [provider]    Family History Family History  Problem Relation Age of Onset  .  Heart attack Mother   . Diabetes Other   . Hypertension Other   . Heart disease Other   . Arthritis Other     Social History Social History   Tobacco Use  . Smoking status: Former Smoker    Packs/day: 1.00    Years: 20.00    Pack years: 20.00    Types: Cigarettes    Last attempt to quit: 10/23/1980    Years since quitting: 37.5  . Smokeless tobacco: Never Used  Substance Use Topics  . Alcohol use: No    Alcohol/week: 0.0 standard drinks  . Drug use: No     Allergies  Metformin   Review of Systems Review of Systems All other systems negative except as documented in the HPI. All pertinent positives and negatives as reviewed in the HPI.  Physical Exam Updated Vital Signs BP 124/67 (BP Location: Right Arm)   Pulse 70   Temp 98.1 F (36.7 C) (Oral)   Resp 16   Ht 5\' 6"  (1.676 m)   Wt 124.7 kg   SpO2 100%   BMI 44.39 kg/m   Physical Exam Vitals signs and nursing note reviewed.  Constitutional:      General: He is not in acute distress.    Appearance: He is well-developed.  HENT:     Head: Normocephalic and atraumatic.  Eyes:     Pupils: Pupils are equal, round, and reactive to light.  Pulmonary:     Effort: Pulmonary effort is normal.  Musculoskeletal:       Feet:  Skin:    General: Skin is warm and dry.  Neurological:     Mental Status: He is alert and oriented to person, place, and time.      ED Treatments / Results  Labs (all labs ordered are listed, but only abnormal results are displayed) Labs Reviewed - No data to display  EKG None  Radiology Dg Foot Complete Right  Result Date: 04/20/2018 CLINICAL DATA:  73 y/o M; right foot pain greatest at the bases of the great toe for 3 days. EXAM: RIGHT FOOT COMPLETE - 3+ VIEW COMPARISON:  None. FINDINGS: There is no evidence of fracture or dislocation. There is no evidence of arthropathy or other focal bone abnormality. Small plantar calcaneal enthesophyte. No soft tissue mineralization. IMPRESSION:  Negative. Electronically Signed   By: Kristine Garbe M.D.   On: 04/20/2018 16:47    Procedures Procedures (including critical care time)  Medications Ordered in ED Medications - No data to display   Initial Impression / Assessment and Plan / ED Course  I have reviewed the triage vital signs and the nursing notes.  Pertinent labs & imaging results that were available during my care of the patient were reviewed by me and considered in my medical decision making (see chart for details).         I feel that the patient has gout based on his exam and the fact that he is a diabetic.  The patient will be treated for this.  Have advised him to follow-up with his primary doctor.  Told to use ice and elevate.  Final Clinical Impressions(s) / ED Diagnoses   Final diagnoses:  None    ED Discharge Orders    None       Dalia Heading, PA-C 04/20/18 1716    Gareth Morgan, MD 04/22/18 1441

## 2018-05-12 DIAGNOSIS — E118 Type 2 diabetes mellitus with unspecified complications: Secondary | ICD-10-CM | POA: Diagnosis not present

## 2018-05-16 DIAGNOSIS — I509 Heart failure, unspecified: Secondary | ICD-10-CM | POA: Diagnosis not present

## 2018-05-16 DIAGNOSIS — E118 Type 2 diabetes mellitus with unspecified complications: Secondary | ICD-10-CM | POA: Diagnosis not present

## 2018-05-16 DIAGNOSIS — E1142 Type 2 diabetes mellitus with diabetic polyneuropathy: Secondary | ICD-10-CM | POA: Diagnosis not present

## 2018-05-16 DIAGNOSIS — M109 Gout, unspecified: Secondary | ICD-10-CM | POA: Diagnosis not present

## 2018-05-16 DIAGNOSIS — I2510 Atherosclerotic heart disease of native coronary artery without angina pectoris: Secondary | ICD-10-CM | POA: Diagnosis not present

## 2018-05-29 ENCOUNTER — Encounter (HOSPITAL_BASED_OUTPATIENT_CLINIC_OR_DEPARTMENT_OTHER): Payer: Self-pay | Admitting: Emergency Medicine

## 2018-05-29 ENCOUNTER — Other Ambulatory Visit: Payer: Self-pay

## 2018-05-29 ENCOUNTER — Emergency Department (HOSPITAL_BASED_OUTPATIENT_CLINIC_OR_DEPARTMENT_OTHER)
Admission: EM | Admit: 2018-05-29 | Discharge: 2018-05-29 | Disposition: A | Discharge status: Home / Self Care | Payer: Medicare Other | Attending: Emergency Medicine | Admitting: Emergency Medicine

## 2018-05-29 DIAGNOSIS — M109 Gout, unspecified: Secondary | ICD-10-CM

## 2018-05-29 DIAGNOSIS — I5023 Acute on chronic systolic (congestive) heart failure: Secondary | ICD-10-CM | POA: Diagnosis not present

## 2018-05-29 DIAGNOSIS — Z950 Presence of cardiac pacemaker: Secondary | ICD-10-CM | POA: Diagnosis not present

## 2018-05-29 DIAGNOSIS — Z87891 Personal history of nicotine dependence: Secondary | ICD-10-CM | POA: Diagnosis not present

## 2018-05-29 DIAGNOSIS — Z7982 Long term (current) use of aspirin: Secondary | ICD-10-CM | POA: Insufficient documentation

## 2018-05-29 DIAGNOSIS — I110 Hypertensive heart disease with heart failure: Secondary | ICD-10-CM | POA: Diagnosis not present

## 2018-05-29 DIAGNOSIS — M79674 Pain in right toe(s): Secondary | ICD-10-CM

## 2018-05-29 DIAGNOSIS — Z794 Long term (current) use of insulin: Secondary | ICD-10-CM | POA: Diagnosis not present

## 2018-05-29 DIAGNOSIS — I2510 Atherosclerotic heart disease of native coronary artery without angina pectoris: Secondary | ICD-10-CM | POA: Insufficient documentation

## 2018-05-29 DIAGNOSIS — E119 Type 2 diabetes mellitus without complications: Secondary | ICD-10-CM | POA: Diagnosis not present

## 2018-05-29 DIAGNOSIS — Z79899 Other long term (current) drug therapy: Secondary | ICD-10-CM | POA: Insufficient documentation

## 2018-05-29 DIAGNOSIS — Z8546 Personal history of malignant neoplasm of prostate: Secondary | ICD-10-CM | POA: Diagnosis not present

## 2018-05-29 MED ORDER — INDOMETHACIN 25 MG PO CAPS: 25.0000 mg | ORAL_CAPSULE | Freq: Three times a day (TID) | ORAL | 0 refills | Status: DC | PRN

## 2018-05-29 MED ORDER — HYDROCODONE-ACETAMINOPHEN 5-325 MG PO TABS
1.0000 | ORAL_TABLET | Freq: Once | ORAL | Status: AC
  Administered 2018-05-29: 1 via ORAL
  Filled 2018-05-29: qty 1

## 2018-05-29 MED ORDER — COLCHICINE 0.6 MG PO TABS: 0.6000 mg | ORAL_TABLET | Freq: Every day | ORAL | 0 refills | Status: DC

## 2018-05-29 MED ORDER — HYDROCODONE-ACETAMINOPHEN 5-325 MG PO TABS: 1.0000 | ORAL_TABLET | ORAL | 0 refills | Status: DC | PRN

## 2018-05-29 NOTE — ED Provider Notes (Signed)
[place] EMERGENCY DEPARTMENT Provider Note   CSN: [id number] Arrival date & time: 05/29/18  0915    History   Chief Complaint Chief Complaint  Patient presents with  . Gout    HPI Dillinger Aston. is a 73 y.o. male.     The history is provided by the patient and medical records. No language interpreter was used.  Foot Pain  This is a recurrent problem. The current episode started yesterday. The problem occurs constantly. The problem has not changed since onset.Pertinent negatives include no chest pain, no abdominal pain, no headaches and no shortness of breath. The symptoms are aggravated by walking. Nothing relieves the symptoms. He has tried nothing for the symptoms. The treatment provided no relief.    Past Medical History:  Diagnosis Date  . Cancer [facility])    prostate  (Radiation only)  . CHF (congestive heart failure) ([place])   . Diabetes mellitus   . Hypertension   . Kidney stones   . Leg swelling   . Low back pain   . Myocardial infarction ([place]) 12/09/1993  . Obese   . OSA (obstructive sleep apnea)   . Peripheral neuropathy   . PTSD (post-traumatic stress disorder)     Patient Active Problem List   Diagnosis Date Noted  . Blood in stool 10/22/2017  . Liver cirrhosis ([place]) 10/22/2017  . GI bleed 10/22/2017  . Diabetic neuropathy ([place]) 02/26/2016  . Essential hypertension 02/26/2016  . Coronary artery disease 02/26/2016  . Hyperlipidemia 02/26/2016  . Elevated troponin 02/26/2016  . Morbid obesity ([place]) 02/26/2016  . Depression with anxiety 02/26/2016  . Hypokalemia 02/26/2016  . Cardiac pacemaker in situ 02/26/2016  . CAP (community acquired pneumonia) 02/25/2016  . Multifocal pneumonia 02/25/2016  . Acute respiratory failure with hypoxia ([place]) 02/25/2016  . Acute on chronic systolic CHF (congestive heart failure) ([place]) 06/23/2014  . Uncontrolled diabetes mellitus with complications ([place]) 26/37/8588  . Anemia 11/30/2013    Past Surgical  History:  Procedure Laterality Date  . CARDIAC DEFIBRILLATOR PLACEMENT    . COLONOSCOPY WITH PROPOFOL N/A 10/26/2017   Procedure: COLONOSCOPY WITH PROPOFOL;  Surgeon: Wonda Horner, MD;  Location: [facility] ENDOSCOPY;  Service: Endoscopy;  Laterality: N/A;  . CORONARY ANGIOPLASTY WITH STENT PLACEMENT  1995    x 3 stents  . JOINT REPLACEMENT  2004   right total knee replacement  . MULTIPLE TOOTH EXTRACTIONS     Total Tooth extraction  . PACEMAKER INSERTION    . PACEMAKER PLACEMENT          Home Medications    Prior to Admission medications   Medication Sig Start Date End Date Taking? Authorizing Provider  acetaminophen (TYLENOL) 325 MG tablet Take 650 mg by mouth 3 (three) times daily as needed (pain).     [provider]  albuterol (PROVENTIL HFA;VENTOLIN HFA) 108 (90 BASE) MCG/ACT inhaler Inhale 2 puffs into the lungs every 4 (four) hours as needed for wheezing or shortness of breath. 08/24/13   Orpah Greek, MD  aspirin EC 81 MG tablet Take 81 mg by mouth daily.    [provider]  atorvastatin (LIPITOR) 80 MG tablet Take 40 mg by mouth at bedtime.     [provider]  clopidogrel (PLAVIX) 75 MG tablet Take 75 mg by mouth daily. 02/16/16   [provider]  colchicine 0.6 MG tablet Take 1 tablet (0.6 mg total) by mouth daily. 04/20/18   Lawyer, Harrell Gave, PA-C  docusate sodium (COLACE)  100 MG capsule Take 100 mg by mouth 2 (two) times daily.    [provider]  DULoxetine (CYMBALTA) 60 MG capsule Take 60 mg by mouth daily.     [provider]  furosemide (LASIX) 40 MG tablet Take 1 tablet (40 mg total) by mouth daily. Take an extra dose if you gain 2 lbs in 24 hours. Patient taking differently: Take 40 mg by mouth See admin instructions. Take one tablet (40 mg) by mouth every morning, take an extra dose if you gain 2 lbs in 24 hours. 02/28/16   Rama, Venetia Maxon, MD  gabapentin (NEURONTIN) 400 MG capsule Take 400 mg by mouth 3  (three) times daily.     [provider]  HYDROcodone-acetaminophen (NORCO/VICODIN) 5-325 MG tablet Take 1 tablet by mouth every 6 (six) hours as needed for moderate pain. 04/20/18   Lawyer, Harrell Gave, PA-C  indomethacin (INDOCIN) 25 MG capsule Take 1 capsule (25 mg total) by mouth 3 (three) times daily as needed. 04/20/18   Lawyer, Harrell Gave, PA-C  insulin aspart (NOVOLOG) 100 UNIT/ML FlexPen Inject 25 Units into the skin 2 (two) times daily with a meal.     [provider]  Insulin Glargine (LANTUS) 100 UNIT/ML Solostar Pen Inject 48-50 Units into the skin See admin instructions. Inject 50 units subcutaneously every morning and 48 units at night    [provider]  isosorbide mononitrate (IMDUR) 30 MG 24 hr tablet Take 30 mg daily by mouth.    [provider]  lurasidone (LATUDA) 40 MG TABS tablet Take 40 mg by mouth daily with breakfast.     [provider]  Magnesium 400 MG TABS Take 400 mg by mouth daily.    [provider]  metoprolol (TOPROL-XL) 200 MG 24 hr tablet Take 100 mg by mouth daily. Take with or immediately following a meal.     [provider]  Multiple Vitamin (MULTIVITAMIN WITH MINERALS) TABS tablet Take 1 tablet by mouth daily.    [provider]  nitroGLYCERIN (NITROSTAT) 0.4 MG SL tablet Place 0.4 mg under the tongue every 5 (five) minutes as needed for chest pain.     [provider]  omeprazole (PRILOSEC) 20 MG capsule Take 40 mg by mouth daily.     [provider]  oxyCODONE-acetaminophen (PERCOCET) 7.5-325 MG tablet Take 1 tablet by mouth every 6 (six) hours as needed (pain).    [provider]  polyethylene glycol (MIRALAX / GLYCOLAX) packet Take 17 g by mouth daily as needed (constipation).     [provider]  potassium chloride (K-DUR,KLOR-CON) 10 MEQ tablet Take 10 mEq by mouth daily.    [provider]  primidone (MYSOLINE) 50 MG tablet Take 50 mg by  mouth 2 (two) times daily.     [provider]  sacubitril-valsartan (ENTRESTO) 49-51 MG Take 1 tablet 2 (two) times daily by mouth.    [provider]  spironolactone (ALDACTONE) 25 MG tablet Take 25 mg by mouth daily. 02/16/16   [provider]  tamsulosin (FLOMAX) 0.4 MG CAPS capsule Take 0.8 mg by mouth at bedtime.     [provider]  terbinafine (LAMISIL) 1 % cream Apply 1 application topically daily as needed for itching.    [provider]  traZODone (DESYREL) 100 MG tablet Take 100 mg by mouth at bedtime.    [provider]    Family History Family History  Problem Relation Age of Onset  . Heart attack  Mother   . Diabetes Other   . Hypertension Other   . Heart disease Other   . Arthritis Other     Social History Social History   Tobacco Use  . Smoking status: Former Smoker    Packs/day: 1.00    Years: 20.00    Pack years: 20.00    Types: Cigarettes    Last attempt to quit: 10/23/1980    Years since quitting: 37.6  . Smokeless tobacco: Never Used  Substance Use Topics  . Alcohol use: No    Alcohol/week: 0.0 standard drinks  . Drug use: No     Allergies   Metformin   Review of Systems Review of Systems  Constitutional: Negative for activity change, chills, diaphoresis, fatigue and fever.  HENT: Negative for congestion and rhinorrhea.   Eyes: Negative for visual disturbance.  Respiratory: Negative for cough, chest tightness, shortness of breath, wheezing and stridor.   Cardiovascular: Negative for chest pain, palpitations and leg swelling.  Gastrointestinal: Negative for abdominal distention, abdominal pain, blood in stool, constipation, diarrhea, nausea and vomiting.  Genitourinary: Negative for difficulty urinating, dysuria, flank pain and frequency.  Musculoskeletal: Negative for back pain and gait problem.  Skin: Positive for color change (redness on great toe base). Negative for rash and wound.   Neurological: Negative for dizziness, speech difficulty, weakness, light-headedness, numbness and headaches.  Psychiatric/Behavioral: Negative for agitation.  All other systems reviewed and are negative.    Physical Exam Updated Vital Signs BP 123/65 (BP Location: Right Arm)   Pulse 70   Temp 98.7 F (37.1 C) (Oral)   Resp 20   Ht 5' 6.5" (1.689 m)   Wt 121.6 kg   SpO2 98%   BMI 42.61 kg/m   Physical Exam Vitals signs and nursing note reviewed.  Constitutional:      General: He is not in acute distress.    Appearance: He is well-developed. He is not ill-appearing, toxic-appearing or diaphoretic.  HENT:     Head: Normocephalic and atraumatic.     Nose: No congestion or rhinorrhea.     Mouth/Throat:     Mouth: Mucous membranes are moist.     Pharynx: No oropharyngeal exudate or posterior oropharyngeal erythema.  Eyes:     Conjunctiva/sclera: Conjunctivae normal.     Pupils: Pupils are equal, round, and reactive to light.  Neck:     Musculoskeletal: Neck supple. No muscular tenderness.  Cardiovascular:     Rate and Rhythm: Normal rate and regular rhythm.     Pulses: Normal pulses.  Pulmonary:     Effort: Pulmonary effort is normal. No respiratory distress.     Breath sounds: Normal breath sounds. No wheezing, rhonchi or rales.  Chest:     Chest wall: No tenderness.  Abdominal:     General: Abdomen is flat.     Palpations: Abdomen is soft.     Tenderness: There is no abdominal tenderness. There is no right CVA tenderness or left CVA tenderness.  Musculoskeletal:        General: Tenderness present. No deformity or signs of injury.     Right lower leg: No edema.     Left lower leg: No edema.     Right foot: Tenderness present. No laceration.       Feet:  Skin:    General: Skin is warm and dry.     Capillary Refill: Capillary refill takes less than 2 seconds.     Findings: Erythema present. No rash.  Neurological:  General: No focal deficit present.     Mental  Status: He is alert.      ED Treatments / Results  Labs (all labs ordered are listed, but only abnormal results are displayed) Labs Reviewed - No data to display  EKG None  Radiology No results found.  Procedures Procedures (including critical care time)  Medications Ordered in ED Medications  HYDROcodone-acetaminophen (NORCO/VICODIN) 5-325 MG per tablet 1 tablet (1 tablet Oral Given 05/29/18 0959)     Initial Impression / Assessment and Plan / ED Course  I have reviewed the triage vital signs and the nursing notes.  Pertinent labs & imaging results that were available during my care of the patient were reviewed by me and considered in my medical decision making (see chart for details).        Jaaron Oleson. is a 73 y.o. male with a past medical history significant for CHF with ICD/pacemaker, diabetes, CAD, hypertension, hyperlipidemia, cirrhosis, and gout who presents with right great toe pain consistent with gout flare.  He reports that 1 month ago he had similar symptoms and was given prescription for colchicine, indomethacin, and hydrocodone which took care of his symptoms.  He reports that he completed those medications and then when his right great toe started hurting again yesterday, he had no medication.  He reports he tried to call to get prescriptions filled however his PCP would not do so.  Patient says that he thinks this is gout and he denies any trauma.  He denies fevers, chills, cuts, scrapes, or any other injuries to the foot.  He denies any other symptoms.  No nausea vomiting, urinary symptoms or GI symptoms.  He primarily wants refills of his medications we can manage this at home.  On exam, patient has erythema and tenderness in the right great toe consistent with podagra.  Normal pulses.  Patient reports some decrease sensation in the foot consistent with baseline.  Knee and hips nontender.  Lungs clear chest nontender.  Abdomen nontender.  Patient resting  comfortably.  Patient given a dose of hydrocodone here in emergency department and his medications were refilled.  I have low suspicion for significant infection or other traumatic abnormality.  Patient agrees with plan of care and outpatient follow-up.  He understands strict return precautions for any new or worsened symptoms.  He had no other questions or concerns and was discharged in good condition with improving symptoms.   Final Clinical Impressions(s) / ED Diagnoses   Final diagnoses:  Acute gout involving toe of right foot, unspecified cause  Great toe pain, right    ED Discharge Orders         Ordered    HYDROcodone-acetaminophen (NORCO/VICODIN) 5-325 MG tablet  Every 4 hours PRN     05/29/18 1010    indomethacin (INDOCIN) 25 MG capsule  3 times daily PRN     05/29/18 1010    colchicine 0.6 MG tablet  Daily     05/29/18 1010         Clinical Impression: 1. Acute gout involving toe of right foot, unspecified cause   2. Great toe pain, right     Disposition: Discharge  Condition: Good  I have discussed the results, Dx and Tx plan with the pt(& family if present). He/she/they expressed understanding and agree(s) with the plan. Discharge instructions discussed at great length. Strict return precautions discussed and pt &/or family have verbalized understanding of the instructions. No further questions at time of  discharge.    New Prescriptions   COLCHICINE 0.6 MG TABLET    Take 1 tablet (0.6 mg total) by mouth daily.   HYDROCODONE-ACETAMINOPHEN (NORCO/VICODIN) 5-325 MG TABLET    Take 1 tablet by mouth every 4 (four) hours as needed.   INDOMETHACIN (INDOCIN) 25 MG CAPSULE    Take 1 capsule (25 mg total) by mouth 3 (three) times daily as needed.    Follow Up: Jani Gravel, MD [address] [place] [id number] [phone]     [place] [address] [id number] [address] [employer] [place]  [phone]        , Gwenyth Allegra, MD 05/29/18 1018

## 2018-05-29 NOTE — Discharge Instructions (Signed)
Your history and exam today are consistent with a recurrent gout flare.  As you had no trauma, we had a shared decision-making conversation and agreed not to pursue labs or imaging at this time.  As you report the combination medicines you prescribed last time caused resolution of your symptoms, we will represcribed the colchicine, indomethacin, and hydrocodone.  Please follow-up with your primary doctor for further management.  If any symptoms change or worsen, please return to the nearest emergency department.

## 2018-05-29 NOTE — ED Triage Notes (Signed)
Pt c/o gout symptoms to right foot onset yesterday morning.

## 2018-06-07 DIAGNOSIS — I255 Ischemic cardiomyopathy: Secondary | ICD-10-CM | POA: Diagnosis not present

## 2018-06-07 DIAGNOSIS — I2510 Atherosclerotic heart disease of native coronary artery without angina pectoris: Secondary | ICD-10-CM | POA: Diagnosis not present

## 2018-06-07 DIAGNOSIS — I5042 Chronic combined systolic (congestive) and diastolic (congestive) heart failure: Secondary | ICD-10-CM | POA: Diagnosis not present

## 2018-06-07 DIAGNOSIS — G4730 Sleep apnea, unspecified: Secondary | ICD-10-CM | POA: Diagnosis not present

## 2018-06-07 DIAGNOSIS — I5022 Chronic systolic (congestive) heart failure: Secondary | ICD-10-CM | POA: Diagnosis not present

## 2018-06-15 DIAGNOSIS — D591 Other autoimmune hemolytic anemias: Secondary | ICD-10-CM | POA: Diagnosis not present

## 2018-07-19 DIAGNOSIS — I509 Heart failure, unspecified: Secondary | ICD-10-CM | POA: Diagnosis not present

## 2018-07-19 DIAGNOSIS — Z95810 Presence of automatic (implantable) cardiac defibrillator: Secondary | ICD-10-CM | POA: Diagnosis not present

## 2018-07-26 ENCOUNTER — Encounter (HOSPITAL_BASED_OUTPATIENT_CLINIC_OR_DEPARTMENT_OTHER): Payer: Self-pay

## 2018-07-26 ENCOUNTER — Other Ambulatory Visit: Payer: Self-pay

## 2018-07-26 ENCOUNTER — Emergency Department (HOSPITAL_BASED_OUTPATIENT_CLINIC_OR_DEPARTMENT_OTHER): Payer: Medicare Other

## 2018-07-26 ENCOUNTER — Emergency Department (HOSPITAL_BASED_OUTPATIENT_CLINIC_OR_DEPARTMENT_OTHER)
Admission: EM | Admit: 2018-07-26 | Discharge: 2018-07-26 | Disposition: A | Discharge status: Home / Self Care | Payer: Medicare Other | Attending: Emergency Medicine | Admitting: Emergency Medicine

## 2018-07-26 DIAGNOSIS — G8929 Other chronic pain: Secondary | ICD-10-CM

## 2018-07-26 DIAGNOSIS — I2510 Atherosclerotic heart disease of native coronary artery without angina pectoris: Secondary | ICD-10-CM | POA: Insufficient documentation

## 2018-07-26 DIAGNOSIS — Z96651 Presence of right artificial knee joint: Secondary | ICD-10-CM | POA: Diagnosis not present

## 2018-07-26 DIAGNOSIS — M79671 Pain in right foot: Secondary | ICD-10-CM | POA: Diagnosis not present

## 2018-07-26 DIAGNOSIS — Z7982 Long term (current) use of aspirin: Secondary | ICD-10-CM | POA: Insufficient documentation

## 2018-07-26 DIAGNOSIS — E119 Type 2 diabetes mellitus without complications: Secondary | ICD-10-CM | POA: Diagnosis not present

## 2018-07-26 DIAGNOSIS — I110 Hypertensive heart disease with heart failure: Secondary | ICD-10-CM | POA: Diagnosis not present

## 2018-07-26 DIAGNOSIS — Z794 Long term (current) use of insulin: Secondary | ICD-10-CM | POA: Insufficient documentation

## 2018-07-26 DIAGNOSIS — Z955 Presence of coronary angioplasty implant and graft: Secondary | ICD-10-CM | POA: Insufficient documentation

## 2018-07-26 DIAGNOSIS — Z87891 Personal history of nicotine dependence: Secondary | ICD-10-CM | POA: Diagnosis not present

## 2018-07-26 DIAGNOSIS — I5022 Chronic systolic (congestive) heart failure: Secondary | ICD-10-CM | POA: Insufficient documentation

## 2018-07-26 DIAGNOSIS — Z7902 Long term (current) use of antithrombotics/antiplatelets: Secondary | ICD-10-CM | POA: Diagnosis not present

## 2018-07-26 DIAGNOSIS — M25562 Pain in left knee: Secondary | ICD-10-CM | POA: Insufficient documentation

## 2018-07-26 DIAGNOSIS — Z8546 Personal history of malignant neoplasm of prostate: Secondary | ICD-10-CM | POA: Insufficient documentation

## 2018-07-26 DIAGNOSIS — Z950 Presence of cardiac pacemaker: Secondary | ICD-10-CM | POA: Diagnosis not present

## 2018-07-26 DIAGNOSIS — M109 Gout, unspecified: Secondary | ICD-10-CM

## 2018-07-26 DIAGNOSIS — R2241 Localized swelling, mass and lump, right lower limb: Secondary | ICD-10-CM | POA: Diagnosis not present

## 2018-07-26 DIAGNOSIS — M25862 Other specified joint disorders, left knee: Secondary | ICD-10-CM | POA: Diagnosis not present

## 2018-07-26 DIAGNOSIS — Z79899 Other long term (current) drug therapy: Secondary | ICD-10-CM | POA: Insufficient documentation

## 2018-07-26 LAB — BASIC METABOLIC PANEL
Anion gap: 8 (ref 5–15)
BUN: 17 mg/dL (ref 8–23)
CO2: 24 mmol/L (ref 22–32)
Calcium: 8.7 mg/dL — ABNORMAL LOW (ref 8.9–10.3)
Chloride: 105 mmol/L (ref 98–111)
Creatinine, Ser: 1 mg/dL (ref 0.61–1.24)
GFR calc Af Amer: >60 mL/min (ref >60)
GFR calc non Af Amer: >60 mL/min (ref >60)
Glucose, Bld: 122 mg/dL — ABNORMAL HIGH (ref 70–99)
Potassium: 4.2 mmol/L (ref 3.5–5.1)
Sodium: 137 mmol/L (ref 135–145)

## 2018-07-26 MED ORDER — COLCHICINE 0.6 MG PO TABS: 0.6000 mg | ORAL_TABLET | Freq: Every day | ORAL | 1 refills | Status: DC

## 2018-07-26 MED FILL — COLCHICINE 0.6 MG TABS: 0.6 | 10 days supply | Qty: 10 | Fill #0

## 2018-07-26 NOTE — ED Provider Notes (Signed)
[facility] EMERGENCY DEPARTMENT Provider Note   CSN: [id number] Arrival date & time: 07/26/18  1237    History   Chief Complaint Chief Complaint  Patient presents with  . Knee Pain    HPI Johnny Ochoa. is a 73 y.o. male.     HPI  Patient is a 6 male with a past medical history of CHF with ICD/pacemaker in place, type 2 diabetes mellitus, hypertension, leg swelling, gout presenting for right foot pain, leg swelling, left knee pain.  Patient reports that the foot swelling and pain is been chronic x3 months.  He reports he has had multiple episodes of gout and has been treated for such but it keeps returning.  He denies any fevers, chills, nausea, vomiting, or discolored skin of his foot.  Patient ports he has previously been treated with colchicine, but the symptoms return.  Patient also reports some chronic pain in his right lower leg.  He reports he has chronic swelling in both legs due to CHF and takes furosemide.  Denies any color change.  Patient ports that his left knee has been chronically hurting him due to "needing a knee replacement".  Patient denies any falls, wound, or injury. He reports that he has an orthopedist that he follows with who would like him to lose weight prior to getting a knee replacement.  He denies any effusion or erythema of the knee.  He is able to flex and extend without difficulty.  Patient denies any chest pain or shortness of breath.  Past Medical History:  Diagnosis Date  . Cancer [facility])    prostate  (Radiation only)  . CHF (congestive heart failure) ([place])   . Diabetes mellitus   . Hypertension   . Kidney stones   . Leg swelling   . Low back pain   . Myocardial infarction ([place]) 12/09/1993  . Obese   . OSA (obstructive sleep apnea)   . Peripheral neuropathy   . PTSD (post-traumatic stress disorder)     Patient Active Problem List   Diagnosis Date Noted  . Blood in stool 10/22/2017  . Liver cirrhosis ([place]) 10/22/2017  . GI  bleed 10/22/2017  . Diabetic neuropathy ([place]) 02/26/2016  . Essential hypertension 02/26/2016  . Coronary artery disease 02/26/2016  . Hyperlipidemia 02/26/2016  . Elevated troponin 02/26/2016  . Morbid obesity ([place]) 02/26/2016  . Depression with anxiety 02/26/2016  . Hypokalemia 02/26/2016  . Cardiac pacemaker in situ 02/26/2016  . CAP (community acquired pneumonia) 02/25/2016  . Multifocal pneumonia 02/25/2016  . Acute respiratory failure with hypoxia ([place]) 02/25/2016  . Acute on chronic systolic CHF (congestive heart failure) ([place]) 06/23/2014  . Uncontrolled diabetes mellitus with complications ([place]) 32/99/2426  . Anemia 11/30/2013    Past Surgical History:  Procedure Laterality Date  . CARDIAC DEFIBRILLATOR PLACEMENT    . COLONOSCOPY WITH PROPOFOL N/A 10/26/2017   Procedure: COLONOSCOPY WITH PROPOFOL;  Surgeon: Wonda Horner, MD;  Location: [facility] ENDOSCOPY;  Service: Endoscopy;  Laterality: N/A;  . CORONARY ANGIOPLASTY WITH STENT PLACEMENT  1995    x 3 stents  . JOINT REPLACEMENT  2004   right total knee replacement  . MULTIPLE TOOTH EXTRACTIONS     Total Tooth extraction  . PACEMAKER INSERTION    . PACEMAKER PLACEMENT          Home Medications    Prior to Admission medications   Medication Sig Start Date End Date Taking? Authorizing Provider  acetaminophen (TYLENOL) 325 MG tablet Take  650 mg by mouth 3 (three) times daily as needed (pain).     [provider]  albuterol (PROVENTIL HFA;VENTOLIN HFA) 108 (90 BASE) MCG/ACT inhaler Inhale 2 puffs into the lungs every 4 (four) hours as needed for wheezing or shortness of breath. 08/24/13   Orpah Greek, MD  aspirin EC 81 MG tablet Take 81 mg by mouth daily.    [provider]  atorvastatin (LIPITOR) 80 MG tablet Take 40 mg by mouth at bedtime.     [provider]  clopidogrel (PLAVIX) 75 MG tablet Take 75 mg by mouth daily. 02/16/16   [provider]  colchicine 0.6 MG tablet Take  1 tablet (0.6 mg total) by mouth daily. 04/20/18   Lawyer, Harrell Gave, PA-C  colchicine 0.6 MG tablet Take 1 tablet (0.6 mg total) by mouth daily. 05/29/18   Tegeler, Gwenyth Allegra, MD  docusate sodium (COLACE) 100 MG capsule Take 100 mg by mouth 2 (two) times daily.    [provider]  DULoxetine (CYMBALTA) 60 MG capsule Take 60 mg by mouth daily.     [provider]  furosemide (LASIX) 40 MG tablet Take 1 tablet (40 mg total) by mouth daily. Take an extra dose if you gain 2 lbs in 24 hours. Patient taking differently: Take 40 mg by mouth See admin instructions. Take one tablet (40 mg) by mouth every morning, take an extra dose if you gain 2 lbs in 24 hours. 02/28/16   Rama, Venetia Maxon, MD  gabapentin (NEURONTIN) 400 MG capsule Take 400 mg by mouth 3 (three) times daily.     [provider]  HYDROcodone-acetaminophen (NORCO/VICODIN) 5-325 MG tablet Take 1 tablet by mouth every 6 (six) hours as needed for moderate pain. 04/20/18   Lawyer, Harrell Gave, PA-C  HYDROcodone-acetaminophen (NORCO/VICODIN) 5-325 MG tablet Take 1 tablet by mouth every 4 (four) hours as needed. 05/29/18   Tegeler, Gwenyth Allegra, MD  indomethacin (INDOCIN) 25 MG capsule Take 1 capsule (25 mg total) by mouth 3 (three) times daily as needed. 04/20/18   Lawyer, Harrell Gave, PA-C  indomethacin (INDOCIN) 25 MG capsule Take 1 capsule (25 mg total) by mouth 3 (three) times daily as needed. 05/29/18   Tegeler, Gwenyth Allegra, MD  insulin aspart (NOVOLOG) 100 UNIT/ML FlexPen Inject 25 Units into the skin 2 (two) times daily with a meal.     [provider]  Insulin Glargine (LANTUS) 100 UNIT/ML Solostar Pen Inject 48-50 Units into the skin See admin instructions. Inject 50 units subcutaneously every morning and 48 units at night    [provider]  isosorbide mononitrate (IMDUR) 30 MG 24 hr tablet Take 30 mg daily by mouth.    [provider]  lurasidone (LATUDA) 40 MG TABS tablet Take 40 mg by  mouth daily with breakfast.     [provider]  Magnesium 400 MG TABS Take 400 mg by mouth daily.    [provider]  metoprolol (TOPROL-XL) 200 MG 24 hr tablet Take 100 mg by mouth daily. Take with or immediately following a meal.     [provider]  Multiple Vitamin (MULTIVITAMIN WITH MINERALS) TABS tablet Take 1 tablet by mouth daily.    [provider]  nitroGLYCERIN (NITROSTAT) 0.4 MG SL tablet Place 0.4 mg under the tongue every 5 (five) minutes as needed for chest pain.     [provider]  omeprazole (PRILOSEC) 20 MG capsule Take 40 mg by mouth daily.     [provider]  oxyCODONE-acetaminophen (PERCOCET) 7.5-325 MG tablet Take 1 tablet by mouth every 6 (six) hours as needed (pain).    [provider]  polyethylene glycol (MIRALAX / GLYCOLAX) packet Take 17 g by mouth daily as needed (constipation).     [provider]  potassium chloride (K-DUR,KLOR-CON) 10 MEQ tablet Take 10 mEq by mouth daily.    [provider]  primidone (MYSOLINE) 50 MG tablet Take 50 mg by mouth 2 (two) times daily.     [provider]  sacubitril-valsartan (ENTRESTO) 49-51 MG Take 1 tablet 2 (two) times daily by mouth.    [provider]  spironolactone (ALDACTONE) 25 MG tablet Take 25 mg by mouth daily. 02/16/16   [provider]  tamsulosin (FLOMAX) 0.4 MG CAPS capsule Take 0.8 mg by mouth at bedtime.     [provider]  terbinafine (LAMISIL) 1 % cream Apply 1 application topically daily as needed for itching.    [provider]  traZODone (DESYREL) 100 MG tablet Take 100 mg by mouth at bedtime.    [provider]    Family History Family History  Problem Relation Age of Onset  . Heart attack Mother   . Diabetes Other   . Hypertension Other   . Heart disease Other   . Arthritis Other     Social History Social History   Tobacco Use  . Smoking status: Former Smoker     Packs/day: 1.00    Years: 20.00    Pack years: 20.00    Types: Cigarettes    Last attempt to quit: 10/23/1980    Years since quitting: 37.7  . Smokeless tobacco: Never Used  Substance Use Topics  . Alcohol use: No    Alcohol/week: 0.0 standard drinks  . Drug use: No     Allergies   Metformin   Review of Systems Review of Systems  Constitutional: Negative for chills and fever.  Cardiovascular: Positive for leg swelling.  Gastrointestinal: Negative for nausea and vomiting.  Musculoskeletal: Positive for arthralgias and joint swelling.  Skin: Negative for wound.  Neurological: Negative for weakness and numbness.  All other systems reviewed and are negative.    Physical Exam Updated Vital Signs BP 104/64 (BP Location: Left Arm)   Pulse 70   Temp 98 F (36.7 C) (Oral)   Resp 18   Ht 5\' 6"  (1.676 m)   Wt 122 kg   SpO2 98%   BMI 43.42 kg/m   Physical Exam Vitals signs and nursing note reviewed.  Constitutional:      General: He is not in acute distress.    Appearance: He is well-developed. He is obese. He is not ill-appearing or diaphoretic.     Comments: Sitting comfortably in bed.  HENT:     Head: Normocephalic and atraumatic.  Eyes:     General:        Right eye: No discharge.        Left eye: No discharge.     Conjunctiva/sclera: Conjunctivae normal.     Comments: EOMs normal to gross examination.  Neck:     Musculoskeletal: Normal range of motion.  Cardiovascular:     Rate and Rhythm: Normal rate and regular rhythm.     Comments: Intact, 2+  DP pulses bilaterally.  Abdominal:     General: There is no distension.  Musculoskeletal:     Right lower leg: Edema present.     Left lower leg: Edema present.  Comments: Patient has 1+ pitting edema bilateral lower extremities.  Calves appear symmetric. Mild right calf TTP.  Patient has some nonpitting edema overlying the dorsum of the right foot.  No erythema of bilateral feet. Patient has intact, 2+ DP  pulses bilaterally. Left knee with tenderness to palpation just distal to patella. Full ROM. No joint line tenderness. No joint effusion or swelling appreciated. No abnormal alignment or patellar mobility. No bruising, erythema or warmth overlaying the joint. No varus/valgus laxity. No crepitus.  2+ DP pulses bilaterally. All compartments are soft. Sensation intact distal to injury.   Skin:    General: Skin is warm and dry.  Neurological:     Mental Status: He is alert.     Comments: Cranial nerves intact to gross observation. Patient moves extremities without difficulty.  Psychiatric:        Behavior: Behavior normal.        Thought Content: Thought content normal.        Judgment: Judgment normal.      ED Treatments / Results  Labs (all labs ordered are listed, but only abnormal results are displayed) Labs Reviewed  BASIC METABOLIC PANEL - Abnormal; Notable for the following components:      Result Value   Glucose, Bld 122 (*)    Calcium 8.7 (*)    All other components within normal limits    EKG None  Radiology US Venous Img Lower Right (dvt Study)  Result Date: 07/26/2018 CLINICAL DATA:  Right lower extremity swelling and calf pain EXAM: RIGHT LOWER EXTREMITY VENOUS DOPPLER ULTRASOUND TECHNIQUE: Gray-scale sonography with graded compression, as well as color Doppler and duplex ultrasound were performed to evaluate the lower extremity deep venous systems from the level of the common femoral vein and including the common femoral, femoral, profunda femoral, popliteal and calf veins including the posterior tibial, peroneal and gastrocnemius veins when visible. The superficial great saphenous vein was also interrogated. Spectral Doppler was utilized to evaluate flow at rest and with distal augmentation maneuvers in the common femoral, femoral and popliteal veins. COMPARISON:  None. FINDINGS: Contralateral Common Femoral Vein: Respiratory phasicity is normal and symmetric with the  symptomatic side. No evidence of thrombus. Normal compressibility. Common Femoral Vein: No evidence of thrombus. Normal compressibility, respiratory phasicity and response to augmentation. Saphenofemoral Junction: No evidence of thrombus. Normal compressibility and flow on color Doppler imaging. Profunda Femoral Vein: No evidence of thrombus. Normal compressibility and flow on color Doppler imaging. Femoral Vein: No evidence of thrombus. Normal compressibility, respiratory phasicity and response to augmentation. Popliteal Vein: No evidence of thrombus. Normal compressibility, respiratory phasicity and response to augmentation. Calf Veins: No evidence of thrombus. Normal compressibility and flow on color Doppler imaging. Superficial Great Saphenous Vein: No evidence of thrombus. Normal compressibility. Venous Reflux:  Not assessed Other Findings:  None. IMPRESSION: No evidence of deep venous thrombosis. Electronically Signed   By: Jerilynn Mages.  Shick M.D.   On: 07/26/2018 14:41   Dg Knee Complete 4 Views Left  Result Date: 07/26/2018 CLINICAL DATA:  Chronic left knee pain without known injury. EXAM: LEFT KNEE - COMPLETE 4+ VIEW COMPARISON:  Radiographs of Jun 22, 2017. FINDINGS: No evidence of fracture, dislocation, or joint effusion. Severe narrowing of medial joint space is noted. Mild narrowing of lateral joint space is noted with osteophyte formation and chondrocalcinosis. Moderate narrowing of patellofemoral space is noted with osteophyte formation. Soft tissues are unremarkable. IMPRESSION: Severe degenerative joint disease is noted. No acute abnormality seen in the left knee.  Electronically Signed   By: Marijo Conception M.D.   On: 07/26/2018 14:27   Dg Foot Complete Right  Result Date: 07/26/2018 CLINICAL DATA:  Right foot pain and swelling for 2 months. EXAM: RIGHT FOOT COMPLETE - 3+ VIEW COMPARISON:  Radiographs of April 20, 2018. FINDINGS: There is no evidence of fracture or dislocation. There is no evidence of  arthropathy or other focal bone abnormality. Dorsal soft tissue swelling is noted. IMPRESSION: No fracture or dislocation is noted. Dorsal soft tissue swelling is noted suggesting infection or traumatic injury. Electronically Signed   By: Marijo Conception M.D.   On: 07/26/2018 14:25    Procedures Procedures (including critical care time)  Medications Ordered in ED Medications - No data to display   Initial Impression / Assessment and Plan / ED Course  I have reviewed the triage vital signs and the nursing notes.  Pertinent labs & imaging results that were available during my care of the patient were reviewed by me and considered in my medical decision making (see chart for details).  Clinical Course as of Jul 25 1612  Tue Jul 26, 2018  1610 Normal renal function.   Creatinine: 1.00 [AM]  7341 Pt reports swelling is chronic. Soft tissue swelling appearing to be coming from podagra. Attending physician in agreement.   DG Foot Complete Right [AM]    Clinical Course User Index [AM] Albesa Seen, PA-C       This is a well-appearing 73 year old male with multiple chronic medical conditions presenting for right foot pain and swelling and left knee pain for couple months.  Patient denies any infectious symptoms such as discoloration of lower extremity, sensation of heat in the lower extremities, fevers, chills, nausea, or vomiting.  Clinically, foot pain consistent with prior gout episodes.  Patient does have some increasing swelling on the right side there to the left, but patient ports this is chronic.  Will check DVT study.  Radiographs demonstrate no acute abnormality of the right foot.  Soft tissue swelling did raise question from radiologist regarding infection, but clinically does not appear cellulitic.  No diabetic foot wound.  Left knee showing profound osteoarthritis.  Renal function checked today and colchicine represcribed.  Patient given short course of pain medication.  He is  instructed not to drive, drink alcohol or operate machinery while taking Norco.  He is instructed to follow-up with podiatry and his primary care provider as well as orthopedics.  Return precautions given for any increasing pain, swelling, erythema or fever or chills.  Patient is in understanding and agrees with plan of care.  This is a shared visit with Dr. Blanchie Dessert. Patient was independently evaluated by this attending physician. Attending physician consulted in evaluation and discharge management.  Final Clinical Impressions(s) / ED Diagnoses   Final diagnoses:  Acute gout involving toe of right foot, unspecified cause  Chronic pain of left knee    ED Discharge Orders         Ordered    colchicine 0.6 MG tablet  Daily     07/26/18 1623    Ambulatory referral to Podiatry     07/26/18 1625           Tamala Julian 07/26/18 1640    Blanchie Dessert, MD 07/29/18 2120

## 2018-07-26 NOTE — ED Notes (Signed)
Patient transported to X-ray 

## 2018-07-26 NOTE — ED Notes (Signed)
Updated pts wife that pt is waiting for EDP to go over test results. Pts wife appreciative of update and states she will continue to wait in car.

## 2018-07-26 NOTE — ED Triage Notes (Signed)
Pt c/o pain to right knee x "few" days-states he is in need of surgery-also c/o swelling to right foot since "march"-reports hx of gout-NAD-to triage in w/c

## 2018-07-26 NOTE — Discharge Instructions (Signed)
Please see the information and instructions below regarding your visit.  Your diagnoses today include:  1. Acute gout involving toe of right foot, unspecified cause   2. Chronic pain of left knee     Tests performed today include: See side panel of your discharge paperwork for testing performed today. Vital signs are listed at the bottom of these instructions.   Medications prescribed:    Take any prescribed medications only as prescribed, and any over the counter medications only as directed on the packaging.  Please take colchicine 1 tablet for the next 10 days.  You were given one refill.   Please use your home oxycodone as needed for pain.  Home care instructions:  Please follow any educational materials contained in this packet.   Follow-up instructions: Please follow-up with your primary care provider in one week.   Please make an appointment with podiatry.   Return instructions:  Please return to the Emergency Department if you experience worsening symptoms.  Please return the emergency department for any increasing pain, swelling, redness, or fever or chills. Please return if you have any other emergent concerns.  Additional Information:   Your vital signs today were: BP 104/64 (BP Location: Left Arm)    Pulse 70    Temp 98 F (36.7 C) (Oral)    Resp 18    Ht 5\' 6"  (1.676 m)    Wt 122 kg    SpO2 98%    BMI 43.42 kg/m  If your blood pressure (BP) was elevated on multiple readings during this visit above 130 for the top number or above 80 for the bottom number, please have this repeated by your primary care provider within one month. --------------  Thank you for allowing [place] to participate in your care today.

## 2018-07-28 DIAGNOSIS — E118 Type 2 diabetes mellitus with unspecified complications: Secondary | ICD-10-CM | POA: Diagnosis not present

## 2018-07-28 DIAGNOSIS — E785 Hyperlipidemia, unspecified: Secondary | ICD-10-CM | POA: Diagnosis not present

## 2018-07-28 DIAGNOSIS — I10 Essential (primary) hypertension: Secondary | ICD-10-CM | POA: Diagnosis not present

## 2018-08-02 ENCOUNTER — Other Ambulatory Visit: Payer: Self-pay

## 2018-08-02 ENCOUNTER — Ambulatory Visit (INDEPENDENT_AMBULATORY_CARE_PROVIDER_SITE_OTHER): Payer: Medicare Other | Admitting: Podiatry

## 2018-08-02 ENCOUNTER — Other Ambulatory Visit: Payer: Self-pay | Admitting: Podiatry

## 2018-08-02 ENCOUNTER — Encounter: Payer: Self-pay | Admitting: Podiatry

## 2018-08-02 VITALS — BP 103/64 | HR 71 | Temp 97.2°F | Resp 18

## 2018-08-02 DIAGNOSIS — T8859XA Other complications of anesthesia, initial encounter: Secondary | ICD-10-CM | POA: Insufficient documentation

## 2018-08-02 DIAGNOSIS — M779 Enthesopathy, unspecified: Secondary | ICD-10-CM | POA: Diagnosis not present

## 2018-08-02 DIAGNOSIS — B351 Tinea unguium: Secondary | ICD-10-CM

## 2018-08-02 DIAGNOSIS — IMO0002 Reserved for concepts with insufficient information to code with codable children: Secondary | ICD-10-CM

## 2018-08-02 DIAGNOSIS — K219 Gastro-esophageal reflux disease without esophagitis: Secondary | ICD-10-CM | POA: Insufficient documentation

## 2018-08-02 DIAGNOSIS — M79676 Pain in unspecified toe(s): Secondary | ICD-10-CM | POA: Diagnosis not present

## 2018-08-02 DIAGNOSIS — E118 Type 2 diabetes mellitus with unspecified complications: Secondary | ICD-10-CM

## 2018-08-02 DIAGNOSIS — M109 Gout, unspecified: Secondary | ICD-10-CM | POA: Diagnosis not present

## 2018-08-02 DIAGNOSIS — T4145XA Adverse effect of unspecified anesthetic, initial encounter: Secondary | ICD-10-CM | POA: Insufficient documentation

## 2018-08-02 DIAGNOSIS — D689 Coagulation defect, unspecified: Secondary | ICD-10-CM

## 2018-08-02 DIAGNOSIS — E1165 Type 2 diabetes mellitus with hyperglycemia: Secondary | ICD-10-CM

## 2018-08-02 DIAGNOSIS — H9190 Unspecified hearing loss, unspecified ear: Secondary | ICD-10-CM | POA: Insufficient documentation

## 2018-08-02 DIAGNOSIS — M778 Other enthesopathies, not elsewhere classified: Secondary | ICD-10-CM

## 2018-08-02 MED ORDER — INDOMETHACIN 50 MG PO CAPS: 50.0000 mg | ORAL_CAPSULE | Freq: Two times a day (BID) | ORAL | 1 refills | Status: DC

## 2018-08-02 MED ORDER — COLCHICINE 0.6 MG PO TABS: ORAL_TABLET | ORAL | 3 refills | Status: DC

## 2018-08-02 NOTE — Progress Notes (Signed)
Subjective:  Patient ID: Verne Spurr., male    DOB: [birth year],  MRN: [id number] HPI Chief Complaint  Patient presents with  . Foot Pain    Dorsal forefoot and midfoot right - swelling, aching, redness/discoloration since late Feb or early MArch, PCP treating for gout, still hasn't gotten better, had xrays, venous study, taking colchicine for the last 8 days  . Diabetes    Last a1c was 7.9  . New Patient (Initial Visit)    73 y.o. male presents with the above complaint.   ROS: Denies fever chills nausea vomiting muscle aches pains calf pain back pain chest pain shortness of breath.  Past Medical History:  Diagnosis Date  . Cancer [facility])    prostate  (Radiation only)  . CHF (congestive heart failure) ([place])   . Diabetes mellitus   . Hypertension   . Kidney stones   . Leg swelling   . Low back pain   . Myocardial infarction ([place]) 12/09/1993  . Obese   . OSA (obstructive sleep apnea)   . Peripheral neuropathy   . PTSD (post-traumatic stress disorder)    Past Surgical History:  Procedure Laterality Date  . CARDIAC DEFIBRILLATOR PLACEMENT    . COLONOSCOPY WITH PROPOFOL N/A 10/26/2017   Procedure: COLONOSCOPY WITH PROPOFOL;  Surgeon: Wonda Horner, MD;  Location: [facility] ENDOSCOPY;  Service: Endoscopy;  Laterality: N/A;  . CORONARY ANGIOPLASTY WITH STENT PLACEMENT  1995    x 3 stents  . JOINT REPLACEMENT  2004   right total knee replacement  . MULTIPLE TOOTH EXTRACTIONS     Total Tooth extraction  . PACEMAKER INSERTION    . PACEMAKER PLACEMENT      Current Outpatient Medications:  .  acetaminophen (TYLENOL) 325 MG tablet, Take 650 mg by mouth 3 (three) times daily as needed (pain). , Disp: , Rfl:  .  albuterol (PROVENTIL HFA;VENTOLIN HFA) 108 (90 BASE) MCG/ACT inhaler, Inhale 2 puffs into the lungs every 4 (four) hours as needed for wheezing or shortness of breath., Disp: 1 Inhaler, Rfl: 0 .  aspirin EC 81 MG tablet, Take 81 mg by mouth daily., Disp: , Rfl:  .   atorvastatin (LIPITOR) 80 MG tablet, Take 40 mg by mouth at bedtime. , Disp: , Rfl:  .  clopidogrel (PLAVIX) 75 MG tablet, Take 75 mg by mouth daily., Disp: , Rfl: 3 .  colchicine 0.6 MG tablet, Take one tablet daily, Disp: 90 tablet, Rfl: 3 .  docusate sodium (COLACE) 100 MG capsule, Take 100 mg by mouth 2 (two) times daily., Disp: , Rfl:  .  DULoxetine (CYMBALTA) 60 MG capsule, Take 60 mg by mouth daily. , Disp: , Rfl:  .  furosemide (LASIX) 40 MG tablet, Take 1 tablet (40 mg total) by mouth daily. Take an extra dose if you gain 2 lbs in 24 hours. (Patient taking differently: Take 40 mg by mouth See admin instructions. Take one tablet (40 mg) by mouth every morning, take an extra dose if you gain 2 lbs in 24 hours.), Disp: 60 tablet, Rfl: 3 .  gabapentin (NEURONTIN) 400 MG capsule, Take 400 mg by mouth 3 (three) times daily. , Disp: , Rfl:  .  HYDROcodone-acetaminophen (NORCO/VICODIN) 5-325 MG tablet, Take 1 tablet by mouth every 4 (four) hours as needed., Disp: 12 tablet, Rfl: 0 .  indomethacin (INDOCIN) 50 MG capsule, TAKE 1 CAPSULE(50 MG) BY MOUTH TWICE DAILY WITH A MEAL, Disp: 180 capsule, Rfl: 1 .  insulin aspart (NOVOLOG) 100 UNIT/ML  FlexPen, Inject 25 Units into the skin 2 (two) times daily with a meal. , Disp: , Rfl:  .  Insulin Glargine (LANTUS) 100 UNIT/ML Solostar Pen, Inject 48-50 Units into the skin See admin instructions. Inject 50 units subcutaneously every morning and 48 units at night, Disp: , Rfl:  .  isosorbide mononitrate (IMDUR) 30 MG 24 hr tablet, Take 30 mg daily by mouth., Disp: , Rfl:  .  lurasidone (LATUDA) 40 MG TABS tablet, Take 40 mg by mouth daily with breakfast. , Disp: , Rfl:  .  Magnesium 400 MG TABS, Take 400 mg by mouth daily., Disp: , Rfl:  .  metoprolol (TOPROL-XL) 200 MG 24 hr tablet, Take 100 mg by mouth daily. Take with or immediately following a meal. , Disp: , Rfl:  .  Multiple Vitamin (MULTIVITAMIN WITH MINERALS) TABS tablet, Take 1 tablet by mouth daily.,  Disp: , Rfl:  .  nitroGLYCERIN (NITROSTAT) 0.4 MG SL tablet, Place 0.4 mg under the tongue every 5 (five) minutes as needed for chest pain. , Disp: , Rfl:  .  omeprazole (PRILOSEC) 20 MG capsule, Take 40 mg by mouth daily. , Disp: , Rfl:  .  oxybutynin (DITROPAN-XL) 10 MG 24 hr tablet, Take by mouth., Disp: , Rfl:  .  oxyCODONE-acetaminophen (PERCOCET) 7.5-325 MG tablet, Take 1 tablet by mouth every 6 (six) hours as needed (pain)., Disp: , Rfl:  .  polyethylene glycol (MIRALAX / GLYCOLAX) packet, Take 17 g by mouth daily as needed (constipation). , Disp: , Rfl:  .  potassium chloride (K-DUR,KLOR-CON) 10 MEQ tablet, Take 10 mEq by mouth daily., Disp: , Rfl:  .  primidone (MYSOLINE) 50 MG tablet, Take 50 mg by mouth 2 (two) times daily. , Disp: , Rfl:  .  sacubitril-valsartan (ENTRESTO) 49-51 MG, Take 1 tablet 2 (two) times daily by mouth., Disp: , Rfl:  .  spironolactone (ALDACTONE) 25 MG tablet, Take 25 mg by mouth daily., Disp: , Rfl: 0 .  tamsulosin (FLOMAX) 0.4 MG CAPS capsule, Take 0.8 mg by mouth at bedtime. , Disp: , Rfl:  .  terbinafine (LAMISIL) 1 % cream, Apply 1 application topically daily as needed for itching., Disp: , Rfl:  .  traZODone (DESYREL) 100 MG tablet, Take 100 mg by mouth at bedtime., Disp: , Rfl:   Allergies  Allergen Reactions  . Metformin Diarrhea   Review of Systems Objective:   Vitals:   08/02/18 1025  BP: 103/64  Pulse: 71  Resp: 18  Temp: (!) 97.2 F (36.2 C)    General: Well developed, nourished, in no acute distress, alert and oriented x3   Dermatological: Skin is warm, dry and supple bilateral. Nails x 10 are well maintained; remaining integument appears unremarkable at this time. There are no open sores, no preulcerative lesions, no rash or signs of infection present.  Toenails are thick yellow dystrophic onychomycotic long sharply incurvated and painful.  Vascular: Dorsalis Pedis artery and Posterior Tibial artery pedal pulses are 2/4 bilateral  with immedate capillary fill time. Pedal hair growth present. No varicosities and no lower extremity edema present bilateral.   Neruologic: Grossly intact via light touch bilateral. Vibratory intact via tuning fork bilateral. Protective threshold with Semmes Wienstein monofilament intact to all pedal sites bilateral. Patellar and Achilles deep tendon reflexes 2+ bilateral. No Babinski or clonus noted bilateral.   Musculoskeletal: No gross boney pedal deformities bilateral. No pain, crepitus, or limitation noted with foot and ankle range of motion bilateral. Muscular strength 5/5 in  all groups tested bilateral.  Red hot swollen first metatarsal phalangeal joint painful on range of motion and painful to touch.  Lesser metatarsals are less painful.  Gait: Unassisted, Nonantalgic.    Radiographs:  I reviewed previous radiographs taken from his primary care provider.  Assessment & Plan:   Assessment: Probable gouty capsulitis right foot.  Pain in limb secondary onychomycosis  Plan: Refilled his colchicine indomethacin and provided him with a cortisone injection around the joint.  Injected 20 mg of Kenalog 5 mg Marcaine for maximal tenderness around the joint.  He tolerated this well without complications.  Debridement of toenails 1 through 5 bilateral.  Should this individual call our office requesting blood work for labs consisting of a CBC and a uric acid for painful gout we should write him a requisition and send him to [facility].     Nakita Santerre T. Burg[place], [place]

## 2018-08-04 DIAGNOSIS — E785 Hyperlipidemia, unspecified: Secondary | ICD-10-CM | POA: Diagnosis not present

## 2018-08-04 DIAGNOSIS — I2510 Atherosclerotic heart disease of native coronary artery without angina pectoris: Secondary | ICD-10-CM | POA: Diagnosis not present

## 2018-08-04 DIAGNOSIS — E1142 Type 2 diabetes mellitus with diabetic polyneuropathy: Secondary | ICD-10-CM | POA: Diagnosis not present

## 2018-08-04 DIAGNOSIS — I10 Essential (primary) hypertension: Secondary | ICD-10-CM | POA: Diagnosis not present

## 2018-08-16 ENCOUNTER — Ambulatory Visit (INDEPENDENT_AMBULATORY_CARE_PROVIDER_SITE_OTHER): Payer: Medicare Other | Admitting: Podiatry

## 2018-08-16 ENCOUNTER — Other Ambulatory Visit: Payer: Self-pay

## 2018-08-16 ENCOUNTER — Encounter: Payer: Self-pay | Admitting: Podiatry

## 2018-08-16 VITALS — Temp 96.7°F

## 2018-08-16 DIAGNOSIS — M778 Other enthesopathies, not elsewhere classified: Secondary | ICD-10-CM

## 2018-08-16 DIAGNOSIS — M779 Enthesopathy, unspecified: Secondary | ICD-10-CM | POA: Diagnosis not present

## 2018-08-16 DIAGNOSIS — M109 Gout, unspecified: Secondary | ICD-10-CM | POA: Insufficient documentation

## 2018-08-16 NOTE — Progress Notes (Signed)
He presents today states that he is about 90% improved from his gout attack from 1 month ago.  He denies fever chills nausea vomiting muscle aches pains calf pain back pain chest pain shortness of breath.  Objective: Vital signs are stable he is alert and oriented x3 pulses are palpable.  Has great range of motion of the first metatarsal phalangeal joint no longer has any erythema there is mild warmth on palpation no edema cellulitis drainage or odor.  Assessment: Resolving gouty capsulitis first metatarsophalangeal joint of the right foot.  Plan: Discussed etiology pathology and surgical therapies at this point time went ahead and suggest that he discontinue the indomethacin but continue to take the colchicine on a daily basis.  Should he have questions or concerns he will notify us.  She had no gout attack he will notify us and he will come by here to receive a requisition for you CBC and uric acid with sed rate..  We will also go ahead and refill his colchicine for 1 year.

## 2018-09-17 ENCOUNTER — Encounter (HOSPITAL_BASED_OUTPATIENT_CLINIC_OR_DEPARTMENT_OTHER): Payer: Self-pay | Admitting: Emergency Medicine

## 2018-09-17 ENCOUNTER — Emergency Department (HOSPITAL_BASED_OUTPATIENT_CLINIC_OR_DEPARTMENT_OTHER)
Admission: EM | Admit: 2018-09-17 | Discharge: 2018-09-17 | Disposition: A | Discharge status: Home / Self Care | Payer: Medicare Other | Attending: Emergency Medicine | Admitting: Emergency Medicine

## 2018-09-17 DIAGNOSIS — Z794 Long term (current) use of insulin: Secondary | ICD-10-CM | POA: Insufficient documentation

## 2018-09-17 DIAGNOSIS — Y999 Unspecified external cause status: Secondary | ICD-10-CM | POA: Insufficient documentation

## 2018-09-17 DIAGNOSIS — Y939 Activity, unspecified: Secondary | ICD-10-CM | POA: Diagnosis not present

## 2018-09-17 DIAGNOSIS — S81832A Puncture wound without foreign body, left lower leg, initial encounter: Secondary | ICD-10-CM

## 2018-09-17 DIAGNOSIS — I252 Old myocardial infarction: Secondary | ICD-10-CM | POA: Insufficient documentation

## 2018-09-17 DIAGNOSIS — Z87891 Personal history of nicotine dependence: Secondary | ICD-10-CM | POA: Insufficient documentation

## 2018-09-17 DIAGNOSIS — Z950 Presence of cardiac pacemaker: Secondary | ICD-10-CM | POA: Diagnosis not present

## 2018-09-17 DIAGNOSIS — Z7902 Long term (current) use of antithrombotics/antiplatelets: Secondary | ICD-10-CM | POA: Insufficient documentation

## 2018-09-17 DIAGNOSIS — X58XXXA Exposure to other specified factors, initial encounter: Secondary | ICD-10-CM | POA: Insufficient documentation

## 2018-09-17 DIAGNOSIS — I5022 Chronic systolic (congestive) heart failure: Secondary | ICD-10-CM | POA: Diagnosis not present

## 2018-09-17 DIAGNOSIS — I110 Hypertensive heart disease with heart failure: Secondary | ICD-10-CM | POA: Insufficient documentation

## 2018-09-17 DIAGNOSIS — Y929 Unspecified place or not applicable: Secondary | ICD-10-CM | POA: Insufficient documentation

## 2018-09-17 DIAGNOSIS — Z7982 Long term (current) use of aspirin: Secondary | ICD-10-CM | POA: Diagnosis not present

## 2018-09-17 DIAGNOSIS — Z79899 Other long term (current) drug therapy: Secondary | ICD-10-CM | POA: Diagnosis not present

## 2018-09-17 DIAGNOSIS — Z96651 Presence of right artificial knee joint: Secondary | ICD-10-CM | POA: Insufficient documentation

## 2018-09-17 DIAGNOSIS — E119 Type 2 diabetes mellitus without complications: Secondary | ICD-10-CM | POA: Insufficient documentation

## 2018-09-17 DIAGNOSIS — I2510 Atherosclerotic heart disease of native coronary artery without angina pectoris: Secondary | ICD-10-CM | POA: Insufficient documentation

## 2018-09-17 DIAGNOSIS — S8992XA Unspecified injury of left lower leg, initial encounter: Secondary | ICD-10-CM | POA: Diagnosis present

## 2018-09-17 MED ORDER — LIDOCAINE-EPINEPHRINE (PF) 2 %-1:200000 IJ SOLN
10.0000 mL | Freq: Once | INTRAMUSCULAR | Status: AC
  Administered 2018-09-17: 10 mL via INTRADERMAL
  Filled 2018-09-17 (×2): qty 10

## 2018-09-17 NOTE — ED Notes (Signed)
Lidocaine at bedside for EDP

## 2018-09-17 NOTE — ED Provider Notes (Signed)
[facility] EMERGENCY DEPARTMENT Provider Note   CSN: [id number] Arrival date & time: 09/17/18  1521     History   Chief Complaint Chief Complaint  Patient presents with  . Puncture Wound    HPI Johnny Ochoa. is a 73 y.o. male.     HPI  73 year old male presents with heavy bleeding from a wound to his left lower leg.  The patient was sitting on the couch and felt some discomfort in his left lower leg.  He poked at it with his finger and does not think he scratched it.  All of a sudden it started gushing blood.  He states it was spraying across the room.  No significant pressure applied.  They came straight here.  Nursing staff has placed combat gauze on it and wrapped it and the bleeding seems to have stopped.  No dizziness or shortness of breath.  Patient is on Plavix.  Past Medical History:  Diagnosis Date  . Cancer [facility])    prostate  (Radiation only)  . CHF (congestive heart failure) ([place])   . Diabetes mellitus   . Hypertension   . Kidney stones   . Leg swelling   . Low back pain   . Myocardial infarction ([place]) 12/09/1993  . Obese   . OSA (obstructive sleep apnea)   . Peripheral neuropathy   . PTSD (post-traumatic stress disorder)     Patient Active Problem List   Diagnosis Date Noted  . Acute gout 08/16/2018  . Anesthesia complication 24/23/5361  . Hearing loss 08/02/2018  . GERD (gastroesophageal reflux disease) 08/02/2018  . Osteoarthritis of left knee 01/31/2018  . Blood in stool 10/22/2017  . Liver cirrhosis ([place]) 10/22/2017  . GI bleed 10/22/2017  . Anxiety 10/28/2016  . Arthritis 10/28/2016  . Bradycardia 10/28/2016  . Degeneration of lumbar intervertebral disc 10/28/2016  . Irregular heart rate 10/28/2016  . Male stress incontinence 10/28/2016  . Myalgia 10/28/2016  . Neck pain 10/28/2016  . Occipital neuralgia 10/28/2016  . Pain in left knee 10/28/2016  . Peripheral neuropathy 10/28/2016  . Diabetic neuropathy ([place]) 02/26/2016  .  Essential hypertension 02/26/2016  . Coronary artery disease 02/26/2016  . Hyperlipidemia 02/26/2016  . Elevated troponin 02/26/2016  . Morbid obesity ([place]) 02/26/2016  . Depression with anxiety 02/26/2016  . Hypokalemia 02/26/2016  . Cardiac pacemaker in situ 02/26/2016  . CAP (community acquired pneumonia) 02/25/2016  . Multifocal pneumonia 02/25/2016  . Acute respiratory failure with hypoxia ([place]) 02/25/2016  . Ischemic cardiomyopathy 10/04/2015  . Diabetic gastroparesis associated with type 2 diabetes mellitus ([place]) 07/18/2015  . Angina at rest [facility]) 04/19/2015  . Cardiomyopathy ([place]) 03/27/2015  . Coronary artery disease involving native coronary artery of native heart with unstable angina pectoris ([place]) 03/27/2015  . Obstructive sleep apnea 03/27/2015  . Shortness of breath 03/27/2015  . Acute on chronic systolic CHF (congestive heart failure) ([place]) 06/23/2014  . Uncontrolled diabetes mellitus with complications ([place]) 44/31/5400  . Anemia 11/30/2013  . Other autoimmune hemolytic anemias ([place]) 11/30/2013    Past Surgical History:  Procedure Laterality Date  . CARDIAC DEFIBRILLATOR PLACEMENT    . COLONOSCOPY WITH PROPOFOL N/A 10/26/2017   Procedure: COLONOSCOPY WITH PROPOFOL;  Surgeon: Wonda Horner, MD;  Location: [facility] ENDOSCOPY;  Service: Endoscopy;  Laterality: N/A;  . CORONARY ANGIOPLASTY WITH STENT PLACEMENT  1995    x 3 stents  . JOINT REPLACEMENT  2004   right total knee replacement  . MULTIPLE TOOTH EXTRACTIONS  Total Tooth extraction  . PACEMAKER INSERTION    . PACEMAKER PLACEMENT          Home Medications    Prior to Admission medications   Medication Sig Start Date End Date Taking? Authorizing Provider  acetaminophen (TYLENOL) 325 MG tablet Take 650 mg by mouth 3 (three) times daily as needed (pain).     [provider]  albuterol (PROVENTIL HFA;VENTOLIN HFA) 108 (90 BASE) MCG/ACT inhaler Inhale 2 puffs into the lungs every 4 (four) hours as  needed for wheezing or shortness of breath. 08/24/13   Orpah Greek, MD  aspirin EC 81 MG tablet Take 81 mg by mouth daily.    [provider]  atorvastatin (LIPITOR) 80 MG tablet Take 40 mg by mouth at bedtime.     [provider]  clopidogrel (PLAVIX) 75 MG tablet Take 75 mg by mouth daily. 02/16/16   [provider]  colchicine 0.6 MG tablet Take one tablet daily 08/02/18   Hyatt, Max T, DPM  docusate sodium (COLACE) 100 MG capsule Take 100 mg by mouth 2 (two) times daily.    [provider]  DULoxetine (CYMBALTA) 60 MG capsule Take 60 mg by mouth daily.     [provider]  furosemide (LASIX) 40 MG tablet Take 1 tablet (40 mg total) by mouth daily. Take an extra dose if you gain 2 lbs in 24 hours. Patient taking differently: Take 40 mg by mouth See admin instructions. Take one tablet (40 mg) by mouth every morning, take an extra dose if you gain 2 lbs in 24 hours. 02/28/16   Rama, Venetia Maxon, MD  gabapentin (NEURONTIN) 400 MG capsule Take 400 mg by mouth 3 (three) times daily.     [provider]  HYDROcodone-acetaminophen (NORCO/VICODIN) 5-325 MG tablet Take 1 tablet by mouth every 4 (four) hours as needed. 05/29/18   Tegeler, Gwenyth Allegra, MD  indomethacin (INDOCIN) 50 MG capsule TAKE 1 CAPSULE(50 MG) BY MOUTH TWICE DAILY WITH A MEAL 08/02/18   Hyatt, Max T, DPM  insulin aspart (NOVOLOG) 100 UNIT/ML FlexPen Inject 25 Units into the skin 2 (two) times daily with a meal.     [provider]  Insulin Glargine (LANTUS) 100 UNIT/ML Solostar Pen Inject 48-50 Units into the skin See admin instructions. Inject 50 units subcutaneously every morning and 48 units at night    [provider]  isosorbide mononitrate (IMDUR) 30 MG 24 hr tablet Take 30 mg daily by mouth.    [provider]  lurasidone (LATUDA) 40 MG TABS tablet Take 40 mg by mouth daily with breakfast.     [provider]  Magnesium 400 MG TABS  Take 400 mg by mouth daily.    [provider]  metoprolol (TOPROL-XL) 200 MG 24 hr tablet Take 100 mg by mouth daily. Take with or immediately following a meal.     [provider]  Multiple Vitamin (MULTIVITAMIN WITH MINERALS) TABS tablet Take 1 tablet by mouth daily.    [provider]  nitroGLYCERIN (NITROSTAT) 0.4 MG SL tablet Place 0.4 mg under the tongue every 5 (five) minutes as needed for chest pain.     [provider]  omeprazole (PRILOSEC) 20 MG capsule Take 40 mg by mouth daily.     [provider]  oxybutynin (DITROPAN-XL) 10 MG 24 hr tablet Take by mouth.    [provider]  oxyCODONE-acetaminophen (PERCOCET) 7.5-325 MG tablet Take 1 tablet by mouth every 6 (  six) hours as needed (pain).    [provider]  polyethylene glycol (MIRALAX / GLYCOLAX) packet Take 17 g by mouth daily as needed (constipation).     [provider]  potassium chloride (K-DUR,KLOR-CON) 10 MEQ tablet Take 10 mEq by mouth daily.    [provider]  primidone (MYSOLINE) 50 MG tablet Take 50 mg by mouth 2 (two) times daily.     [provider]  sacubitril-valsartan (ENTRESTO) 49-51 MG Take 1 tablet 2 (two) times daily by mouth.    [provider]  spironolactone (ALDACTONE) 25 MG tablet Take 25 mg by mouth daily. 02/16/16   [provider]  tamsulosin (FLOMAX) 0.4 MG CAPS capsule Take 0.8 mg by mouth at bedtime.     [provider]  terbinafine (LAMISIL) 1 % cream Apply 1 application topically daily as needed for itching.    [provider]  traZODone (DESYREL) 100 MG tablet Take 100 mg by mouth at bedtime.    [provider]    Family History Family History  Problem Relation Age of Onset  . Heart attack Mother   . Diabetes Other   . Hypertension Other   . Heart disease Other   . Arthritis Other     Social History Social History   Tobacco Use  . Smoking status: Former  Smoker    Packs/day: 1.00    Years: 20.00    Pack years: 20.00    Types: Cigarettes    Quit date: 10/23/1980    Years since quitting: 37.9  . Smokeless tobacco: Never Used  Substance Use Topics  . Alcohol use: No    Alcohol/week: 0.0 standard drinks  . Drug use: No     Allergies   Metformin   Review of Systems Review of Systems  Respiratory: Negative for shortness of breath.   Skin: Positive for wound.  Neurological: Negative for light-headedness.     Physical Exam Updated Vital Signs BP 113/62 (BP Location: Left Arm)   Pulse 77   Temp 98.8 F (37.1 C) (Oral)   Resp 20   Ht 5\' 6"  (1.676 m)   Wt 122 kg   SpO2 99%   BMI 43.42 kg/m   Physical Exam Vitals signs and nursing note reviewed.  Constitutional:      Appearance: He is well-developed.  HENT:     Head: Normocephalic and atraumatic.     Right Ear: External ear normal.     Left Ear: External ear normal.     Nose: Nose normal.  Eyes:     General:        Right eye: No discharge.        Left eye: No discharge.  Neck:     Musculoskeletal: Neck supple.  Cardiovascular:     Rate and Rhythm: Normal rate.     Pulses:          Dorsalis pedis pulses are 2+ on the left side.  Pulmonary:     Effort: Pulmonary effort is normal.     Breath sounds: Normal breath sounds.  Abdominal:     General: There is no distension.  Musculoskeletal:     Left lower leg: He exhibits tenderness and laceration.       Legs:  Skin:    General: Skin is warm and dry.  Neurological:     Mental Status: He is alert.  Psychiatric:        Mood and Affect: Mood is not anxious.  ED Treatments / Results  Labs (all labs ordered are listed, but only abnormal results are displayed) Labs Reviewed - No data to display  EKG None  Radiology No results found.  Procedures .Marland KitchenLaceration Repair  Date/Time: 09/17/2018 4:29 PM Performed by: Sherwood Gambler, MD Authorized by: Sherwood Gambler, MD   Consent:    Consent obtained:   Verbal   Consent given by:  Patient Anesthesia (see MAR for exact dosages):    Anesthesia method:  Local infiltration   Local anesthetic:  Lidocaine 2% WITH epi Laceration details:    Location:  Leg   Leg location:  L lower leg   Length (cm):  0.5 Repair type:    Repair type:  Simple Pre-procedure details:    Preparation:  Patient was prepped and draped in usual sterile fashion Treatment:    Area cleansed with:  Saline   Amount of cleaning:  Standard   Irrigation solution:  Sterile saline   Irrigation method:  Syringe Skin repair:    Repair method:  Sutures   Suture size:  4-0   Suture material:  Prolene   Suture technique:  Figure eight   Number of sutures:  2 Approximation:    Approximation:  Close Post-procedure details:    Dressing:  Bulky dressing   Patient tolerance of procedure:  Tolerated well, no immediate complications   (including critical care time)  Medications Ordered in ED Medications  lidocaine-EPINEPHrine (XYLOCAINE W/EPI) 2 %-1:200000 (PF) injection 10 mL (has no administration in time range)     Initial Impression / Assessment and Plan / ED Course  I have reviewed the triage vital signs and the nursing notes.  Pertinent labs & imaging results that were available during my care of the patient were reviewed by me and considered in my medical decision making (see chart for details).        On closer inspection, this appears to be a lesion.  There was a scab that was removed and there was a fair amount of bleeding.  At the superior aspect a figure-of-eight stitch was placed with good hemostasis but then moving his leg shows that at the inferior portion of the lesion there is further bleeding so a stitch was placed through there as well.  Now there is hemostasis.  He has had this lesion for quite some time and I advised him to follow-up with his PCP for both suture removal and to reevaluate this lesion.  Final Clinical Impressions(s) / ED Diagnoses    Final diagnoses:  Puncture wound of left lower leg, initial encounter    ED Discharge Orders    None       Sherwood Gambler, MD 09/17/18 [phone]

## 2018-09-17 NOTE — ED Notes (Signed)
ED Provider at bedside. 

## 2018-09-17 NOTE — ED Triage Notes (Signed)
Wife states he had a bump to his LLL, and then it started bleeding. Uncontrolled bleeding in waiting room, bleeding controlled at present, puncture type wound noted and combat gauze applied

## 2018-09-17 NOTE — ED Notes (Signed)
ED Provider at bedside. Dr. Goldston 

## 2018-09-21 ENCOUNTER — Encounter (HOSPITAL_BASED_OUTPATIENT_CLINIC_OR_DEPARTMENT_OTHER): Payer: Self-pay

## 2018-09-21 ENCOUNTER — Other Ambulatory Visit: Payer: Self-pay

## 2018-09-21 ENCOUNTER — Emergency Department (HOSPITAL_BASED_OUTPATIENT_CLINIC_OR_DEPARTMENT_OTHER)
Admission: EM | Admit: 2018-09-21 | Discharge: 2018-09-21 | Disposition: A | Discharge status: Home / Self Care | Payer: Medicare Other | Attending: Emergency Medicine | Admitting: Emergency Medicine

## 2018-09-21 DIAGNOSIS — I252 Old myocardial infarction: Secondary | ICD-10-CM | POA: Diagnosis not present

## 2018-09-21 DIAGNOSIS — Z79899 Other long term (current) drug therapy: Secondary | ICD-10-CM | POA: Insufficient documentation

## 2018-09-21 DIAGNOSIS — S81812D Laceration without foreign body, left lower leg, subsequent encounter: Secondary | ICD-10-CM | POA: Insufficient documentation

## 2018-09-21 DIAGNOSIS — Z8546 Personal history of malignant neoplasm of prostate: Secondary | ICD-10-CM | POA: Diagnosis not present

## 2018-09-21 DIAGNOSIS — Z955 Presence of coronary angioplasty implant and graft: Secondary | ICD-10-CM | POA: Insufficient documentation

## 2018-09-21 DIAGNOSIS — E119 Type 2 diabetes mellitus without complications: Secondary | ICD-10-CM | POA: Diagnosis not present

## 2018-09-21 DIAGNOSIS — I25110 Atherosclerotic heart disease of native coronary artery with unstable angina pectoris: Secondary | ICD-10-CM | POA: Insufficient documentation

## 2018-09-21 DIAGNOSIS — X58XXXD Exposure to other specified factors, subsequent encounter: Secondary | ICD-10-CM | POA: Insufficient documentation

## 2018-09-21 DIAGNOSIS — Z96651 Presence of right artificial knee joint: Secondary | ICD-10-CM | POA: Insufficient documentation

## 2018-09-21 DIAGNOSIS — L03116 Cellulitis of left lower limb: Secondary | ICD-10-CM

## 2018-09-21 DIAGNOSIS — Z794 Long term (current) use of insulin: Secondary | ICD-10-CM | POA: Insufficient documentation

## 2018-09-21 DIAGNOSIS — Z95810 Presence of automatic (implantable) cardiac defibrillator: Secondary | ICD-10-CM | POA: Diagnosis not present

## 2018-09-21 DIAGNOSIS — Z5189 Encounter for other specified aftercare: Secondary | ICD-10-CM | POA: Insufficient documentation

## 2018-09-21 DIAGNOSIS — Z7982 Long term (current) use of aspirin: Secondary | ICD-10-CM | POA: Diagnosis not present

## 2018-09-21 DIAGNOSIS — Z7902 Long term (current) use of antithrombotics/antiplatelets: Secondary | ICD-10-CM | POA: Insufficient documentation

## 2018-09-21 DIAGNOSIS — Z4801 Encounter for change or removal of surgical wound dressing: Secondary | ICD-10-CM | POA: Diagnosis not present

## 2018-09-21 DIAGNOSIS — I110 Hypertensive heart disease with heart failure: Secondary | ICD-10-CM | POA: Diagnosis not present

## 2018-09-21 DIAGNOSIS — I5022 Chronic systolic (congestive) heart failure: Secondary | ICD-10-CM | POA: Diagnosis not present

## 2018-09-21 MED ORDER — DOXYCYCLINE HYCLATE 100 MG PO CAPS: 100.0000 mg | ORAL_CAPSULE | Freq: Two times a day (BID) | ORAL | 0 refills | Status: AC

## 2018-09-21 MED FILL — DOXYCYCLINE HYCLATE 100 MG: 100 | 7 days supply | Qty: 14 | Fill #0

## 2018-09-21 NOTE — Discharge Instructions (Signed)
Take antibiotics as directed. Please take all of your antibiotics until finished.  Elevate the leg.  If the wound becomes worse or your redness or swelling start spreading, you develop fever,, nausea/vomiting.  If the wound is looking the same, follow-up with Dr. Maudie Mercury on Monday.

## 2018-09-21 NOTE — ED Triage Notes (Signed)
Pt for recheck of left LE sutures from 8/1-NAD-steady gait with own cane

## 2018-09-21 NOTE — ED Provider Notes (Signed)
[place] EMERGENCY DEPARTMENT Provider Note   CSN: [id number] Arrival date & time: 09/21/18  1417    History   Chief Complaint Chief Complaint  Patient presents with  . Wound Check    HPI Johnny Ochoa. is a 73 y.o. male has no history of hypertension, diabetes, MI presents for evaluation of wound check to left lower extremity.  He was seen on 09/17/2018 for evaluation of wound.  At that time, stitches were placed.  He comes back in today because he states that it feels like it is opening up a little bit he has noticed some bloody discharge noted from the wound.  He states he has noticed some more pain and some surrounding warmth, erythema.  Additionally, he feels like the area surrounding his leg and also his foot as well not.  He has not noted any fevers, nausea/vomiting.  He has a history of diabetes and is on insulin.  He reports his sugars have been running about 130s.     The history is provided by the patient.    Past Medical History:  Diagnosis Date  . Cancer [facility])    prostate  (Radiation only)  . CHF (congestive heart failure) ([place])   . Diabetes mellitus   . Hypertension   . Kidney stones   . Leg swelling   . Low back pain   . Myocardial infarction ([place]) 12/09/1993  . Obese   . OSA (obstructive sleep apnea)   . Peripheral neuropathy   . PTSD (post-traumatic stress disorder)     Patient Active Problem List   Diagnosis Date Noted  . Acute gout 08/16/2018  . Anesthesia complication 63/87/5643  . Hearing loss 08/02/2018  . GERD (gastroesophageal reflux disease) 08/02/2018  . Osteoarthritis of left knee 01/31/2018  . Blood in stool 10/22/2017  . Liver cirrhosis ([place]) 10/22/2017  . GI bleed 10/22/2017  . Anxiety 10/28/2016  . Arthritis 10/28/2016  . Bradycardia 10/28/2016  . Degeneration of lumbar intervertebral disc 10/28/2016  . Irregular heart rate 10/28/2016  . Male stress incontinence 10/28/2016  . Myalgia 10/28/2016  . Neck pain  10/28/2016  . Occipital neuralgia 10/28/2016  . Pain in left knee 10/28/2016  . Peripheral neuropathy 10/28/2016  . Diabetic neuropathy ([place]) 02/26/2016  . Essential hypertension 02/26/2016  . Coronary artery disease 02/26/2016  . Hyperlipidemia 02/26/2016  . Elevated troponin 02/26/2016  . Morbid obesity ([place]) 02/26/2016  . Depression with anxiety 02/26/2016  . Hypokalemia 02/26/2016  . Cardiac pacemaker in situ 02/26/2016  . CAP (community acquired pneumonia) 02/25/2016  . Multifocal pneumonia 02/25/2016  . Acute respiratory failure with hypoxia ([place]) 02/25/2016  . Ischemic cardiomyopathy 10/04/2015  . Diabetic gastroparesis associated with type 2 diabetes mellitus ([place]) 07/18/2015  . Angina at rest [facility]) 04/19/2015  . Cardiomyopathy ([place]) 03/27/2015  . Coronary artery disease involving native coronary artery of native heart with unstable angina pectoris ([place]) 03/27/2015  . Obstructive sleep apnea 03/27/2015  . Shortness of breath 03/27/2015  . Acute on chronic systolic CHF (congestive heart failure) ([place]) 06/23/2014  . Uncontrolled diabetes mellitus with complications ([place]) 32/95/1884  . Anemia 11/30/2013  . Other autoimmune hemolytic anemias ([place]) 11/30/2013    Past Surgical History:  Procedure Laterality Date  . CARDIAC DEFIBRILLATOR PLACEMENT    . COLONOSCOPY WITH PROPOFOL N/A 10/26/2017   Procedure: COLONOSCOPY WITH PROPOFOL;  Surgeon: Wonda Horner, MD;  Location: [facility] ENDOSCOPY;  Service: Endoscopy;  Laterality: N/A;  . [place]  x 3 stents  . JOINT REPLACEMENT  2004   right total knee replacement  . MULTIPLE TOOTH EXTRACTIONS     Total Tooth extraction  . PACEMAKER INSERTION    . PACEMAKER PLACEMENT          Home Medications    Prior to Admission medications   Medication Sig Start Date End Date Taking? Authorizing Provider  acetaminophen (TYLENOL) 325 MG tablet Take 650 mg by mouth 3 (three) times daily as needed  (pain).     [provider]  albuterol (PROVENTIL HFA;VENTOLIN HFA) 108 (90 BASE) MCG/ACT inhaler Inhale 2 puffs into the lungs every 4 (four) hours as needed for wheezing or shortness of breath. 08/24/13   Orpah Greek, MD  aspirin EC 81 MG tablet Take 81 mg by mouth daily.    [provider]  atorvastatin (LIPITOR) 80 MG tablet Take 40 mg by mouth at bedtime.     [provider]  clopidogrel (PLAVIX) 75 MG tablet Take 75 mg by mouth daily. 02/16/16   [provider]  colchicine 0.6 MG tablet Take one tablet daily 08/02/18   Hyatt, Max T, DPM  docusate sodium (COLACE) 100 MG capsule Take 100 mg by mouth 2 (two) times daily.    [provider]  doxycycline (VIBRAMYCIN) 100 MG capsule Take 1 capsule (100 mg total) by mouth 2 (two) times daily for 7 days. 09/21/18 09/28/18  Volanda Napoleon, PA-C  DULoxetine (CYMBALTA) 60 MG capsule Take 60 mg by mouth daily.     [provider]  furosemide (LASIX) 40 MG tablet Take 1 tablet (40 mg total) by mouth daily. Take an extra dose if you gain 2 lbs in 24 hours. Patient taking differently: Take 40 mg by mouth See admin instructions. Take one tablet (40 mg) by mouth every morning, take an extra dose if you gain 2 lbs in 24 hours. 02/28/16   Rama, Venetia Maxon, MD  gabapentin (NEURONTIN) 400 MG capsule Take 400 mg by mouth 3 (three) times daily.     [provider]  HYDROcodone-acetaminophen (NORCO/VICODIN) 5-325 MG tablet Take 1 tablet by mouth every 4 (four) hours as needed. 05/29/18   Tegeler, Gwenyth Allegra, MD  indomethacin (INDOCIN) 50 MG capsule TAKE 1 CAPSULE(50 MG) BY MOUTH TWICE DAILY WITH A MEAL 08/02/18   Hyatt, Max T, DPM  insulin aspart (NOVOLOG) 100 UNIT/ML FlexPen Inject 25 Units into the skin 2 (two) times daily with a meal.     [provider]  Insulin Glargine (LANTUS) 100 UNIT/ML Solostar Pen Inject 48-50 Units into the skin See admin instructions. Inject 50 units  subcutaneously every morning and 48 units at night    [provider]  isosorbide mononitrate (IMDUR) 30 MG 24 hr tablet Take 30 mg daily by mouth.    [provider]  lurasidone (LATUDA) 40 MG TABS tablet Take 40 mg by mouth daily with breakfast.     [provider]  Magnesium 400 MG TABS Take 400 mg by mouth daily.    [provider]  metoprolol (TOPROL-XL) 200 MG 24 hr tablet Take 100 mg by mouth daily. Take with or immediately following a meal.     [provider]  Multiple Vitamin (MULTIVITAMIN WITH MINERALS) TABS tablet Take 1 tablet by mouth daily.    [provider]  nitroGLYCERIN (NITROSTAT) 0.4 MG SL tablet Place 0.4 mg under the tongue every 5 (five) minutes as needed for chest pain.  [provider]  omeprazole (PRILOSEC) 20 MG capsule Take 40 mg by mouth daily.     [provider]  oxybutynin (DITROPAN-XL) 10 MG 24 hr tablet Take by mouth.    [provider]  oxyCODONE-acetaminophen (PERCOCET) 7.5-325 MG tablet Take 1 tablet by mouth every 6 (six) hours as needed (pain).    [provider]  polyethylene glycol (MIRALAX / GLYCOLAX) packet Take 17 g by mouth daily as needed (constipation).     [provider]  potassium chloride (K-DUR,KLOR-CON) 10 MEQ tablet Take 10 mEq by mouth daily.    [provider]  primidone (MYSOLINE) 50 MG tablet Take 50 mg by mouth 2 (two) times daily.     [provider]  sacubitril-valsartan (ENTRESTO) 49-51 MG Take 1 tablet 2 (two) times daily by mouth.    [provider]  spironolactone (ALDACTONE) 25 MG tablet Take 25 mg by mouth daily. 02/16/16   [provider]  tamsulosin (FLOMAX) 0.4 MG CAPS capsule Take 0.8 mg by mouth at bedtime.     [provider]  terbinafine (LAMISIL) 1 % cream Apply 1 application topically daily as needed for itching.    [provider]  traZODone (DESYREL) 100 MG tablet Take  100 mg by mouth at bedtime.    [provider]    Family History Family History  Problem Relation Age of Onset  . Heart attack Mother   . Diabetes Other   . Hypertension Other   . Heart disease Other   . Arthritis Other     Social History Social History   Tobacco Use  . Smoking status: Former Smoker    Packs/day: 1.00    Years: 20.00    Pack years: 20.00    Types: Cigarettes    Quit date: 10/23/1980    Years since quitting: 37.9  . Smokeless tobacco: Never Used  Substance Use Topics  . Alcohol use: No    Alcohol/week: 0.0 standard drinks  . Drug use: No     Allergies   Metformin   Review of Systems Review of Systems  Constitutional: Negative for fever.  Respiratory: Negative for cough and shortness of breath.   Cardiovascular: Negative for chest pain.  Gastrointestinal: Negative for abdominal pain, nausea and vomiting.  Skin: Positive for color change and wound.  All other systems reviewed and are negative.    Physical Exam Updated Vital Signs BP 120/68 (BP Location: Right Arm)   Pulse 72   Temp 98.6 F (37 C) (Oral)   Resp 18   Ht 5\' 6"  (1.676 m)   Wt 121.6 kg   SpO2 98%   BMI 43.26 kg/m   Physical Exam Vitals signs and nursing note reviewed.  Constitutional:      Appearance: He is well-developed.  HENT:     Head: Normocephalic and atraumatic.  Eyes:     General: No scleral icterus.       Right eye: No discharge.        Left eye: No discharge.     Conjunctiva/sclera: Conjunctivae normal.  Cardiovascular:     Pulses:          Dorsalis pedis pulses are 2+ on the right side and 2+ on the left side.  Pulmonary:     Effort: Pulmonary effort is normal.  Musculoskeletal:     Comments: Small amount of warmth, erythema, edema from wound on medial left lower tib-fib that extends down over to the foot.  Full range of  motion of ankle intact with any difficulty.  Skin:    General: Skin is warm and dry.     Capillary Refill: Capillary refill  takes less than 2 seconds.          Comments: Small 1.5 cm wound with 2 stitches in place.  There is slight gapping noted in the middle.  No active drainage.  There is surrounding warmth, erythema, induration.  Neurological:     Mental Status: He is alert.  Psychiatric:        Speech: Speech normal.        Behavior: Behavior normal.      ED Treatments / Results  Labs (all labs ordered are listed, but only abnormal results are displayed) Labs Reviewed - No data to display  EKG None  Radiology No results found.  Procedures Procedures (including critical care time)  Medications Ordered in ED Medications - No data to display   Initial Impression / Assessment and Plan / ED Course  I have reviewed the triage vital signs and the nursing notes.  Pertinent labs & imaging results that were available during my care of the patient were reviewed by me and considered in my medical decision making (see chart for details).        73 year old male who presents for evaluation of wound check.  Was seen here on 09/19/2018 for evaluation of wound.  Had sutures placed at that time.  Reports worsening pain and feels like it is opening up.  No fevers. Patient is afebrile, non-toxic appearing, sitting comfortably on examination table. Vital signs reviewed and stable.  On exam, he has a small 1.5 cm wound noted medial aspect of left tib-fib with sutures in place.  There is some surrounding warmth, erythema.  Active drainage.  No wound does appear gaping.  No evidence of abscess.  Concern for wound infection, vertically given history of diabetes.  He is afebrile here in the ED.  I feel that he is appropriate for p.o. antibiotics.  Discussed plan with Dr. Maryan Rued who is agreeable.  Review of records show that he has tolerated doxycycline without any difficulty.  We will plan to start him on a course of doxycycline.  Encouraged at home supportive care measures as well as wound care. At this time, patient  exhibits no emergent life-threatening condition that require further evaluation in ED or admission. Patient had ample opportunity for questions and discussion. All patient's questions were answered with full understanding. Strict return precautions discussed. Patient expresses understanding and agreement to plan.   Portions of this note were generated with [employer]. Dictation errors may occur despite best attempts at proofreading.  Final Clinical Impressions(s) / ED Diagnoses   Final diagnoses:  Visit for wound check  Cellulitis of left lower extremity    ED Discharge Orders         Ordered    doxycycline (VIBRAMYCIN) 100 MG capsule  2 times daily     09/21/18 1538           Desma Mcgregor 09/21/18 2233    Blanchie Dessert, MD 09/21/18 2351

## 2018-09-26 DIAGNOSIS — Z4802 Encounter for removal of sutures: Secondary | ICD-10-CM | POA: Diagnosis not present

## 2018-09-26 DIAGNOSIS — I10 Essential (primary) hypertension: Secondary | ICD-10-CM | POA: Diagnosis not present

## 2018-09-26 DIAGNOSIS — E118 Type 2 diabetes mellitus with unspecified complications: Secondary | ICD-10-CM | POA: Diagnosis not present

## 2018-10-18 DIAGNOSIS — Z95810 Presence of automatic (implantable) cardiac defibrillator: Secondary | ICD-10-CM | POA: Diagnosis not present

## 2018-10-18 DIAGNOSIS — I509 Heart failure, unspecified: Secondary | ICD-10-CM | POA: Diagnosis not present

## 2018-11-09 DIAGNOSIS — E118 Type 2 diabetes mellitus with unspecified complications: Secondary | ICD-10-CM | POA: Diagnosis not present

## 2018-11-09 DIAGNOSIS — I10 Essential (primary) hypertension: Secondary | ICD-10-CM | POA: Diagnosis not present

## 2018-11-15 DIAGNOSIS — Z23 Encounter for immunization: Secondary | ICD-10-CM | POA: Diagnosis not present

## 2018-11-15 DIAGNOSIS — D649 Anemia, unspecified: Secondary | ICD-10-CM | POA: Diagnosis not present

## 2018-11-15 DIAGNOSIS — I5020 Unspecified systolic (congestive) heart failure: Secondary | ICD-10-CM | POA: Diagnosis not present

## 2018-11-15 DIAGNOSIS — E118 Type 2 diabetes mellitus with unspecified complications: Secondary | ICD-10-CM | POA: Diagnosis not present

## 2018-11-15 DIAGNOSIS — M109 Gout, unspecified: Secondary | ICD-10-CM | POA: Diagnosis not present

## 2018-11-15 DIAGNOSIS — I2510 Atherosclerotic heart disease of native coronary artery without angina pectoris: Secondary | ICD-10-CM | POA: Diagnosis not present

## 2018-12-20 DIAGNOSIS — G4730 Sleep apnea, unspecified: Secondary | ICD-10-CM | POA: Diagnosis not present

## 2018-12-20 DIAGNOSIS — I255 Ischemic cardiomyopathy: Secondary | ICD-10-CM | POA: Diagnosis not present

## 2018-12-20 DIAGNOSIS — I5042 Chronic combined systolic (congestive) and diastolic (congestive) heart failure: Secondary | ICD-10-CM | POA: Diagnosis not present

## 2018-12-20 DIAGNOSIS — I5022 Chronic systolic (congestive) heart failure: Secondary | ICD-10-CM | POA: Diagnosis not present

## 2018-12-20 DIAGNOSIS — I2510 Atherosclerotic heart disease of native coronary artery without angina pectoris: Secondary | ICD-10-CM | POA: Diagnosis not present

## 2019-01-17 DIAGNOSIS — I509 Heart failure, unspecified: Secondary | ICD-10-CM | POA: Diagnosis not present

## 2019-01-17 DIAGNOSIS — Z95810 Presence of automatic (implantable) cardiac defibrillator: Secondary | ICD-10-CM | POA: Diagnosis not present

## 2019-02-01 DIAGNOSIS — I10 Essential (primary) hypertension: Secondary | ICD-10-CM | POA: Diagnosis not present

## 2019-02-01 DIAGNOSIS — E118 Type 2 diabetes mellitus with unspecified complications: Secondary | ICD-10-CM | POA: Diagnosis not present

## 2019-02-01 DIAGNOSIS — N390 Urinary tract infection, site not specified: Secondary | ICD-10-CM | POA: Diagnosis not present

## 2019-02-08 DIAGNOSIS — M109 Gout, unspecified: Secondary | ICD-10-CM | POA: Diagnosis not present

## 2019-02-08 DIAGNOSIS — E1142 Type 2 diabetes mellitus with diabetic polyneuropathy: Secondary | ICD-10-CM | POA: Diagnosis not present

## 2019-02-08 DIAGNOSIS — I5020 Unspecified systolic (congestive) heart failure: Secondary | ICD-10-CM | POA: Diagnosis not present

## 2019-02-08 DIAGNOSIS — E118 Type 2 diabetes mellitus with unspecified complications: Secondary | ICD-10-CM | POA: Diagnosis not present

## 2019-02-27 ENCOUNTER — Other Ambulatory Visit: Payer: Self-pay

## 2019-02-27 ENCOUNTER — Emergency Department (HOSPITAL_BASED_OUTPATIENT_CLINIC_OR_DEPARTMENT_OTHER): Payer: Medicare Other

## 2019-02-27 ENCOUNTER — Emergency Department (HOSPITAL_BASED_OUTPATIENT_CLINIC_OR_DEPARTMENT_OTHER)
Admission: EM | Admit: 2019-02-27 | Discharge: 2019-02-27 | Disposition: A | Discharge status: Home / Self Care | Payer: Medicare Other | Attending: Emergency Medicine | Admitting: Emergency Medicine

## 2019-02-27 ENCOUNTER — Encounter (HOSPITAL_BASED_OUTPATIENT_CLINIC_OR_DEPARTMENT_OTHER): Payer: Self-pay

## 2019-02-27 DIAGNOSIS — M25562 Pain in left knee: Secondary | ICD-10-CM | POA: Insufficient documentation

## 2019-02-27 DIAGNOSIS — W19XXXA Unspecified fall, initial encounter: Secondary | ICD-10-CM

## 2019-02-27 DIAGNOSIS — I5023 Acute on chronic systolic (congestive) heart failure: Secondary | ICD-10-CM | POA: Insufficient documentation

## 2019-02-27 DIAGNOSIS — M25532 Pain in left wrist: Secondary | ICD-10-CM | POA: Diagnosis not present

## 2019-02-27 DIAGNOSIS — S8992XA Unspecified injury of left lower leg, initial encounter: Secondary | ICD-10-CM

## 2019-02-27 DIAGNOSIS — E119 Type 2 diabetes mellitus without complications: Secondary | ICD-10-CM | POA: Diagnosis not present

## 2019-02-27 DIAGNOSIS — Z794 Long term (current) use of insulin: Secondary | ICD-10-CM | POA: Diagnosis not present

## 2019-02-27 DIAGNOSIS — Z888 Allergy status to other drugs, medicaments and biological substances status: Secondary | ICD-10-CM | POA: Diagnosis not present

## 2019-02-27 DIAGNOSIS — Z7982 Long term (current) use of aspirin: Secondary | ICD-10-CM | POA: Insufficient documentation

## 2019-02-27 DIAGNOSIS — I2510 Atherosclerotic heart disease of native coronary artery without angina pectoris: Secondary | ICD-10-CM | POA: Insufficient documentation

## 2019-02-27 DIAGNOSIS — Z79899 Other long term (current) drug therapy: Secondary | ICD-10-CM | POA: Diagnosis not present

## 2019-02-27 DIAGNOSIS — I252 Old myocardial infarction: Secondary | ICD-10-CM | POA: Insufficient documentation

## 2019-02-27 DIAGNOSIS — I110 Hypertensive heart disease with heart failure: Secondary | ICD-10-CM | POA: Diagnosis not present

## 2019-02-27 DIAGNOSIS — Z8546 Personal history of malignant neoplasm of prostate: Secondary | ICD-10-CM | POA: Diagnosis not present

## 2019-02-27 DIAGNOSIS — Z87891 Personal history of nicotine dependence: Secondary | ICD-10-CM | POA: Diagnosis not present

## 2019-02-27 LAB — CBG MONITORING, ED: Glucose-Capillary: 137 mg/dL — ABNORMAL HIGH (ref 70–99)

## 2019-02-27 MED ORDER — HYDROCODONE-ACETAMINOPHEN 5-325 MG PO TABS: 1.0000 | ORAL_TABLET | Freq: Four times a day (QID) | ORAL | 0 refills | Status: DC | PRN

## 2019-02-27 MED ORDER — HYDROCODONE-ACETAMINOPHEN 5-325 MG PO TABS
1.0000 | ORAL_TABLET | Freq: Once | ORAL | Status: AC
  Administered 2019-02-27: 1 via ORAL
  Filled 2019-02-27: qty 1

## 2019-02-27 NOTE — Discharge Instructions (Addendum)
Knee immobilizer to your left leg as needed for comfort.  Keep your thumb spica Velcro splint on your left hand wrist.  Most likely have a scaphoid injury certainly do have a ligament injury in that area.  Call [facility] for follow-up for both of these concerns.  Take the hydrocodone as directed.  You can take the hydrocodone with your hydromorphone that you take routinely.

## 2019-02-27 NOTE — ED Notes (Signed)
Pt requesting copies of his xrays, radiology aware and making pt CD

## 2019-02-27 NOTE — ED Notes (Signed)
ED Provider at bedside. 

## 2019-02-27 NOTE — ED Triage Notes (Signed)
Pt arrives from home with reports of a fall today, Pt reports landing on left wrist and left knee. Pt placed Velcro splint on wrist at home. Denies LOC, denies hitting head. Pt does take Plavix.

## 2019-02-27 NOTE — ED Notes (Signed)
Went to round on patient, pt c/o wait times, is upset other patients were brought back before him and that he is about to leave. This RN explained why some pts may come back before him and apologized for the wait time. Explained there was only 1 provider at this time and if there was anything I could do to make him more comfortable I would be happy to do so. Pt repeated that he would leave if MD did not see him soon.

## 2019-02-27 NOTE — ED Provider Notes (Signed)
[facility] EMERGENCY DEPARTMENT Provider Note   CSN: [id number] Arrival date & time: 02/27/19  1653     History Chief Complaint  Patient presents with  . Fall    Johnny Ochoa. is a 74 y.o. male.  Patient status post fall no loss of consciousness at 12 noon today.  Patient stumbled.  Could not get back up.  Patient has a bad left knee and has been consideration for knee replacement.  But has pain to the left knee and pain to the left wrist.  Patient had a Velcro thumb spica splint which she had on when he came in.  Patient is normally on hydromorphone on a regular basis also followed by the [facility] hospital system.  Patient has been seen by [facility] in the past.  Dr. Lyla Glassing.  Past medical history significant for low back pain diabetes peripheral neuropathy hypertension obesity congestive heart failure posttraumatic stress disorder obstructive sleep apnea.  Surgical history reviewed significant for right total knee replacement in 2004 coronary artery stent in 1995.  Cardiac defibrillator placement.        Past Medical History:  Diagnosis Date  . Cancer [facility])    prostate  (Radiation only)  . CHF (congestive heart failure) ([place])   . Diabetes mellitus   . Hypertension   . Kidney stones   . Leg swelling   . Low back pain   . Myocardial infarction ([place]) 12/09/1993  . Obese   . OSA (obstructive sleep apnea)   . Peripheral neuropathy   . PTSD (post-traumatic stress disorder)     Patient Active Problem List   Diagnosis Date Noted  . Acute gout 08/16/2018  . Anesthesia complication [id number]  . Hearing loss 08/02/2018  . GERD (gastroesophageal reflux disease) 08/02/2018  . Osteoarthritis of left knee 01/31/2018  . Blood in stool 10/22/2017  . Liver cirrhosis ([place]) 10/22/2017  . GI bleed 10/22/2017  . Anxiety 10/28/2016  . Arthritis 10/28/2016  . Bradycardia 10/28/2016  . Degeneration of lumbar intervertebral disc 10/28/2016  . Irregular heart rate 10/28/2016   . Male stress incontinence 10/28/2016  . Myalgia 10/28/2016  . Neck pain 10/28/2016  . Occipital neuralgia 10/28/2016  . Pain in left knee 10/28/2016  . Peripheral neuropathy 10/28/2016  . Diabetic neuropathy ([place]) 02/26/2016  . Essential hypertension 02/26/2016  . Coronary artery disease 02/26/2016  . Hyperlipidemia 02/26/2016  . Elevated troponin 02/26/2016  . Morbid obesity ([place]) 02/26/2016  . Depression with anxiety 02/26/2016  . Hypokalemia 02/26/2016  . Cardiac pacemaker in situ 02/26/2016  . CAP (community acquired pneumonia) 02/25/2016  . Multifocal pneumonia 02/25/2016  . Acute respiratory failure with hypoxia ([place]) 02/25/2016  . Ischemic cardiomyopathy 10/04/2015  . Diabetic gastroparesis associated with type 2 diabetes mellitus ([place]) 07/18/2015  . Angina at [facility]) 04/19/2015  . Cardiomyopathy ([place]) 03/27/2015  . Coronary artery disease involving native coronary artery of native heart with unstable angina pectoris ([place]) 03/27/2015  . Obstructive sleep apnea 03/27/2015  . Shortness of breath 03/27/2015  . Acute on chronic systolic CHF (congestive heart failure) ([place]) 06/23/2014  . Uncontrolled diabetes mellitus with complications ([place]) [id number]  . Anemia 11/30/2013  . Other autoimmune hemolytic anemias 11/30/2013    Past Surgical History:  Procedure Laterality Date  . CARDIAC DEFIBRILLATOR PLACEMENT    . COLONOSCOPY WITH PROPOFOL N/A 10/26/2017   Procedure: COLONOSCOPY WITH PROPOFOL;  Surgeon: Wonda Horner, MD;  [facility] END[facility]COPY;  Service: Endoscopy;  Laterality: N/A;  . CORONARY ANGIOPLASTY WITH STENT  PLACEMENT  1995    x 3 stents  . JOINT REPLACEMENT  2004   right total knee replacement  . MULTIPLE TOOTH EXTRACTIONS     Total Tooth extraction  . PACEMAKER INSERTION    . PACEMAKER PLACEMENT         Family History  Problem Relation Age of Onset  . Heart attack Mother   . Diabetes Other   . Hypertension Other   . Heart disease Other   .  Arthritis Other     Social History   Tobacco Use  . Smoking status: Former Smoker    Packs/day: 1.00    Years: 20.00    Pack years: 20.00    Types: Cigarettes    Quit date: 10/23/1980    Years since quitting: 38.3  . Smokeless tobacco: Never Used  Substance Use Topics  . Alcohol use: No    Alcohol/week: 0.0 standard drinks  . Drug use: No    Home Medications Prior to Admission medications   Medication Sig Start Date End Date Taking? Authorizing Provider  acetaminophen (TYLENOL) 325 MG tablet Take 650 mg by mouth 3 (three) times daily as needed (pain).     [provider]  albuterol (PROVENTIL HFA;VENTOLIN HFA) 108 (90 BASE) MCG/ACT inhaler Inhale 2 puffs into the lungs every 4 (four) hours as needed for wheezing or shortness of breath. 08/24/13   Orpah Greek, MD  aspirin EC 81 MG tablet Take 81 mg by mouth daily.    [provider]  atorvastatin (LIPITOR) 80 MG tablet Take 40 mg by mouth at bedtime.     [provider]  clopidogrel (PLAVIX) 75 MG tablet Take 75 mg by mouth daily. 02/16/16   [provider]  colchicine 0.6 MG tablet Take one tablet daily 08/02/18   Hyatt, Max T, DPM  docusate sodium (COLACE) 100 MG capsule Take 100 mg by mouth 2 (two) times daily.    [provider]  DULoxetine (CYMBALTA) 60 MG capsule Take 60 mg by mouth daily.     [provider]  furosemide (LASIX) 40 MG tablet Take 1 tablet (40 mg total) by mouth daily. Take an extra dose if you gain 2 lbs in 24 hours. Patient taking differently: Take 40 mg by mouth See admin instructions. Take one tablet (40 mg) by mouth every morning, take an extra dose if you gain 2 lbs in 24 hours. 02/28/16   Rama, Venetia Maxon, MD  gabapentin (NEURONTIN) 400 MG capsule Take 400 mg by mouth 3 (three) times daily.     [provider]  HYDROcodone-acetaminophen (NORCO/VICODIN) 5-325 MG tablet Take 1 tablet by mouth every 4 (four) hours as needed. 05/29/18    Tegeler, Gwenyth Allegra, MD  HYDROcodone-acetaminophen (NORCO/VICODIN) 5-325 MG tablet Take 1 tablet by mouth every 6 (six) hours as needed for moderate pain. 02/27/19   Fredia Sorrow, MD  indomethacin (INDOCIN) 50 MG capsule TAKE 1 CAPSULE(50 MG) BY MOUTH TWICE DAILY WITH A MEAL 08/02/18   Hyatt, Max T, DPM  insulin aspart (NOVOLOG) 100 UNIT/ML FlexPen Inject 25 Units into the skin 2 (two) times daily with a meal.     [provider]  Insulin Glargine (LANTUS) 100 UNIT/ML Solostar Pen Inject 48-50 Units into the skin See admin instructions. Inject 50 units subcutaneously every morning and 48 units at night    [provider]  isosorbide mononitrate (IMDUR) 30 MG 24 hr tablet Take 30 mg daily by mouth.    [provider]  lurasidone (LATUDA) 40 MG TABS tablet Take 40 mg by mouth daily with breakfast.     [provider]  Magnesium 400 MG TABS Take 400 mg by mouth daily.    [provider]  metoprolol (TOPROL-XL) 200 MG 24 hr tablet Take 100 mg by mouth daily. Take with or immediately following a meal.     [provider]  Multiple Vitamin (MULTIVITAMIN WITH MINERALS) TABS tablet Take 1 tablet by mouth daily.    [provider]  nitroGLYCERIN (NITROSTAT) 0.4 MG SL tablet Place 0.4 mg under the tongue every 5 (five) minutes as needed for chest pain.     [provider]  omeprazole (PRILOSEC) 20 MG capsule Take 40 mg by mouth daily.     [provider]  oxybutynin (DITROPAN-XL) 10 MG 24 hr tablet Take by mouth.    [provider]  oxyCODONE-acetaminophen (PERCOCET) 7.5-325 MG tablet Take 1 tablet by mouth every 6 (six) hours as needed (pain).    [provider]  polyethylene glycol (MIRALAX / GLYCOLAX) packet Take 17 g by mouth daily as needed (constipation).     [provider]  potassium chloride (K-DUR,KLOR-CON) 10 MEQ tablet Take 10 mEq by mouth daily.    [provider]  primidone  (MYSOLINE) 50 MG tablet Take 50 mg by mouth 2 (two) times daily.     [provider]  sacubitril-valsartan (ENTRESTO) 49-51 MG Take 1 tablet 2 (two) times daily by mouth.    [provider]  spironolactone (ALDACTONE) 25 MG tablet Take 25 mg by mouth daily. 02/16/16   [provider]  tamsulosin (FLOMAX) 0.4 MG CAPS capsule Take 0.8 mg by mouth at bedtime.     [provider]  terbinafine (LAMISIL) 1 % cream Apply 1 application topically daily as needed for itching.    [provider]  traZODone (DESYREL) 100 MG tablet Take 100 mg by mouth at bedtime.    [provider]    Allergies    Metformin  Review of Systems   Review of Systems  Constitutional: Negative for chills and fever.  HENT: Negative for rhinorrhea and sore throat.   Eyes: Negative for visual disturbance.  Respiratory: Negative for cough and shortness of breath.   Cardiovascular: Negative for chest pain and leg swelling.  Gastrointestinal: Negative for abdominal pain, diarrhea, nausea and vomiting.  Genitourinary: Negative for dysuria.  Musculoskeletal: Positive for joint swelling. Negative for back pain and neck pain.  Skin: Negative for rash.  Neurological: Negative for dizziness, light-headedness and headaches.  Hematological: Does not bruise/bleed easily.  Psychiatric/Behavioral: Negative for confusion.    Physical Exam Updated Vital Signs BP (!) 150/68 (BP Location: Right Arm)   Pulse 62   Resp 18   Ht 1.676 m (5\' 6" )   Wt 122 kg   SpO2 100%   BMI 43.42 kg/m   Physical Exam Vitals and nursing note reviewed.  Constitutional:      Appearance: Normal appearance. He is well-developed.  HENT:     Head: Normocephalic and atraumatic.  Eyes:     Extraocular Movements: Extraocular movements intact.     Conjunctiva/sclera: Conjunctivae normal.     Pupils: Pupils are equal, round, and reactive to light.  Cardiovascular:     Rate and Rhythm: Normal rate and  regular rhythm.     Heart sounds: No murmur.  Pulmonary:     Effort: Pulmonary effort is normal. No respiratory distress.     Breath  sounds: Normal breath sounds.  Abdominal:     Palpations: Abdomen is soft.     Tenderness: There is no abdominal tenderness.  Musculoskeletal:        General: Swelling, tenderness and signs of injury present. No deformity.     Cervical back: Normal range of motion and neck supple.     Comments: Left knee without any significant swelling.  No abrasions no contusions.  Left wrist with snuffbox tenderness and some swelling to the area.  Neurovascularly intact distally.  Radial pulse 2+.  Skin:    General: Skin is warm and dry.     Capillary Refill: Capillary refill takes less than 2 seconds.  Neurological:     General: No focal deficit present.     Mental Status: He is alert and oriented to person, place, and time.     ED Results / Procedures / Treatments   Labs (all labs ordered are listed, but only abnormal results are displayed) Labs Reviewed  CBG MONITORING, ED - Abnormal; Notable for the following components:      Result Value   Glucose-Capillary 137 (*)    All other components within normal limits    EKG None  Radiology DG Wrist Complete Left  Result Date: 02/27/2019 CLINICAL DATA:  Wrist pain, fall on outstretched hand. EXAM: LEFT WRIST - COMPLETE 3+ VIEW COMPARISON:  None. FINDINGS: Prominent widening of the scapholunate interval measuring 5.7 mm compatible with underlying scapholunate ligament injury. No fracture is identified. There are well-circumscribed lucencies at the distal radius at the lunate fossa which may reflect subchondral cysts versus erosions. Subtle chondrocalcinosis of the TFC. Mild joint space narrowing at the first [facility] joint. Prominent soft tissue swelling about the wrist. IMPRESSION: 1. Widening of the scapholunate interval compatible with underlying scapholunate ligament injury. 2. Well-circumscribed lucencies at the  distal radius at the lunate fossa may reflect subchondral cysts versus erosions. 3. Chondrocalcinosis of the TFC. Electronically Signed   By: Davina Poke D.O.   On: 02/27/2019 18:34   DG Knee Complete 4 Views Left  Result Date: 02/27/2019 CLINICAL DATA:  74 year old male with fall and trauma to the left knee. EXAM: LEFT KNEE - COMPLETE 4+ VIEW COMPARISON:  Left knee radiograph dated 07/26/2018. FINDINGS: There is no acute fracture or dislocation. The bones are osteopenic. Severe osteoarthritic changes of the knee with severe tricompartmental narrowing and bone spurring. There is a small suprapatellar effusion. There is diffuse subcutaneous edema. IMPRESSION: 1. No acute fracture or dislocation. 2. Osteopenia with severe osteoarthritic changes of the knee. 3. Small suprapatellar effusion. Electronically Signed   By: Anner Crete M.D.   On: 02/27/2019 18:32    Procedures Procedures (including critical care time)  Medications Ordered in ED Medications  HYDROcodone-acetaminophen (NORCO/VICODIN) 5-325 MG per tablet 1 tablet (1 tablet Oral Given 02/27/19 2312)    ED Course  I have reviewed the triage vital signs and the nursing notes.  Pertinent labs & imaging results that were available during my care of the patient were reviewed by me and considered in my medical decision making (see chart for details).    MDM Rules/Calculators/A&P                      X-ray of the left wrist raise concerns for scaphoid lunate ligament injury.  But also has a lot of tenderness over the snuffbox very well could have a scaphoid bone injury.  We will have him use his thumb spica Velcro splint follow-up  with [facility].  Left knee without any acute bony abnormalities.  Will treat with a knee immobilizer.  Patient takes hydromorphone on a regular basis will give him a short course of hydrocodone to supplement that.   Final Clinical Impression(s) / ED Diagnoses Final diagnoses:  Fall, initial encounter   Wrist pain, acute, left  Injury of left knee, initial encounter    Rx / DC Orders ED Discharge Orders         Ordered    HYDROcodone-acetaminophen (NORCO/VICODIN) 5-325 MG tablet  Every 6 hours PRN     02/27/19 2309           Fredia Sorrow, MD 02/27/19 2317

## 2019-10-30 ENCOUNTER — Other Ambulatory Visit: Payer: Self-pay | Admitting: Podiatry

## 2019-11-29 ENCOUNTER — Other Ambulatory Visit: Payer: Self-pay | Admitting: Podiatry

## 2019-11-29 NOTE — Telephone Encounter (Signed)
Please advise 

## 2019-12-29 ENCOUNTER — Other Ambulatory Visit: Payer: Self-pay | Admitting: Podiatry

## 2020-01-28 ENCOUNTER — Other Ambulatory Visit: Payer: Self-pay | Admitting: Podiatry

## 2020-01-29 NOTE — Telephone Encounter (Signed)
Please advise 

## 2020-02-27 ENCOUNTER — Other Ambulatory Visit: Payer: Self-pay | Admitting: Podiatry

## 2020-02-27 NOTE — Telephone Encounter (Signed)
Please advise 

## 2020-03-28 ENCOUNTER — Other Ambulatory Visit: Payer: Self-pay | Admitting: Podiatry

## 2020-04-27 ENCOUNTER — Other Ambulatory Visit: Payer: Self-pay | Admitting: Podiatry

## 2020-04-27 NOTE — Telephone Encounter (Signed)
Will refill further refills per Dr. Milinda Pointer

## 2020-05-27 ENCOUNTER — Other Ambulatory Visit: Payer: Self-pay | Admitting: Podiatry

## 2020-07-18 ENCOUNTER — Encounter (HOSPITAL_BASED_OUTPATIENT_CLINIC_OR_DEPARTMENT_OTHER): Payer: Self-pay

## 2020-07-18 ENCOUNTER — Emergency Department (HOSPITAL_BASED_OUTPATIENT_CLINIC_OR_DEPARTMENT_OTHER): Payer: Medicare Other

## 2020-07-18 ENCOUNTER — Other Ambulatory Visit: Payer: Self-pay

## 2020-07-18 ENCOUNTER — Emergency Department (HOSPITAL_BASED_OUTPATIENT_CLINIC_OR_DEPARTMENT_OTHER)
Admission: EM | Admit: 2020-07-18 | Discharge: 2020-07-18 | Disposition: A | Discharge status: Home / Self Care | Payer: Medicare Other | Source: Home / Self Care | Attending: Emergency Medicine | Admitting: Emergency Medicine

## 2020-07-18 ENCOUNTER — Ambulatory Visit: Payer: Medicare Other | Attending: Internal Medicine

## 2020-07-18 DIAGNOSIS — Z87891 Personal history of nicotine dependence: Secondary | ICD-10-CM | POA: Insufficient documentation

## 2020-07-18 DIAGNOSIS — M7918 Myalgia, other site: Secondary | ICD-10-CM

## 2020-07-18 DIAGNOSIS — Z955 Presence of coronary angioplasty implant and graft: Secondary | ICD-10-CM | POA: Insufficient documentation

## 2020-07-18 DIAGNOSIS — T148XXA Other injury of unspecified body region, initial encounter: Secondary | ICD-10-CM

## 2020-07-18 DIAGNOSIS — Z8546 Personal history of malignant neoplasm of prostate: Secondary | ICD-10-CM | POA: Insufficient documentation

## 2020-07-18 DIAGNOSIS — S0990XA Unspecified injury of head, initial encounter: Secondary | ICD-10-CM | POA: Insufficient documentation

## 2020-07-18 DIAGNOSIS — Z96651 Presence of right artificial knee joint: Secondary | ICD-10-CM | POA: Insufficient documentation

## 2020-07-18 DIAGNOSIS — Z79899 Other long term (current) drug therapy: Secondary | ICD-10-CM | POA: Insufficient documentation

## 2020-07-18 DIAGNOSIS — Z7902 Long term (current) use of antithrombotics/antiplatelets: Secondary | ICD-10-CM | POA: Insufficient documentation

## 2020-07-18 DIAGNOSIS — Z23 Encounter for immunization: Secondary | ICD-10-CM | POA: Insufficient documentation

## 2020-07-18 DIAGNOSIS — W19XXXA Unspecified fall, initial encounter: Secondary | ICD-10-CM

## 2020-07-18 DIAGNOSIS — Z7982 Long term (current) use of aspirin: Secondary | ICD-10-CM | POA: Insufficient documentation

## 2020-07-18 DIAGNOSIS — R079 Chest pain, unspecified: Secondary | ICD-10-CM | POA: Diagnosis not present

## 2020-07-18 DIAGNOSIS — Z950 Presence of cardiac pacemaker: Secondary | ICD-10-CM | POA: Insufficient documentation

## 2020-07-18 DIAGNOSIS — E119 Type 2 diabetes mellitus without complications: Secondary | ICD-10-CM | POA: Insufficient documentation

## 2020-07-18 DIAGNOSIS — S20419A Abrasion of unspecified back wall of thorax, initial encounter: Secondary | ICD-10-CM | POA: Insufficient documentation

## 2020-07-18 DIAGNOSIS — Z794 Long term (current) use of insulin: Secondary | ICD-10-CM | POA: Insufficient documentation

## 2020-07-18 DIAGNOSIS — I110 Hypertensive heart disease with heart failure: Secondary | ICD-10-CM | POA: Insufficient documentation

## 2020-07-18 DIAGNOSIS — W01198A Fall on same level from slipping, tripping and stumbling with subsequent striking against other object, initial encounter: Secondary | ICD-10-CM | POA: Insufficient documentation

## 2020-07-18 DIAGNOSIS — I25110 Atherosclerotic heart disease of native coronary artery with unstable angina pectoris: Secondary | ICD-10-CM | POA: Insufficient documentation

## 2020-07-18 DIAGNOSIS — I5023 Acute on chronic systolic (congestive) heart failure: Secondary | ICD-10-CM | POA: Insufficient documentation

## 2020-07-18 LAB — CBG MONITORING, ED
Glucose-Capillary: 70 mg/dL (ref 70–99)
Glucose-Capillary: 89 mg/dL (ref 70–99)

## 2020-07-18 MED ORDER — TETANUS-DIPHTH-ACELL PERTUSSIS 5-2.5-18.5 LF-MCG/0.5 IM SUSY
0.5000 mL | PREFILLED_SYRINGE | Freq: Once | INTRAMUSCULAR | Status: AC
  Administered 2020-07-18: 0.5 mL via INTRAMUSCULAR
  Filled 2020-07-18: qty 0.5

## 2020-07-18 NOTE — Discharge Instructions (Addendum)
Please rest and ice your back to help with pain. You can take Tylenol as needed. You can also apply OTC Voltaren gel to help with pain.   Follow up with your PCP for further evaluation  Return to the ED for any new/worsening symptoms

## 2020-07-18 NOTE — ED Provider Notes (Signed)
[facility] DEPARTMENT Provider Note   CSN: [id number] Arrival date & time: 07/18/20  1142     History Chief Complaint  Patient presents with  . Fall    Back pain    Johnny Ochoa. is a 75 y.o. male with PMHx HTN, Diabetes, CHF, MI, on Plavix who presents to the ED today with complaint of mechanical fall that occurred earlier today. Pt reports that he was working in his garage and attempted to lift something up when he lost his footing and fell backwards; hitting his upper back against a wooden base of a storage unit. Pt is unsure if he hit his head however denies LOC. He had to lay on the floor for a couple of minutes before attempting to get back up off the ground. He reports that since then he has been having upper back pain. Has not taken anything for pain. Denies headache, neck pain, vision changes.   The history is provided by medical records and the patient.       Past Medical History:  Diagnosis Date  . Cancer [facility])    prostate  (Radiation only)  . CHF (congestive heart failure) ([place])   . Diabetes mellitus   . Hypertension   . Kidney stones   . Leg swelling   . Low back pain   . Myocardial infarction ([place]) 12/09/1993  . Obese   . OSA (obstructive sleep apnea)   . Peripheral neuropathy   . PTSD (post-traumatic stress disorder)     Patient Active Problem List   Diagnosis Date Noted  . Acute gout 08/16/2018  . Anesthesia complication 77/93/9030  . Hearing loss 08/02/2018  . GERD (gastroesophageal reflux disease) 08/02/2018  . Osteoarthritis of left knee 01/31/2018  . Blood in stool 10/22/2017  . Liver cirrhosis ([place]) 10/22/2017  . GI bleed 10/22/2017  . Anxiety 10/28/2016  . Arthritis 10/28/2016  . Bradycardia 10/28/2016  . Degeneration of lumbar intervertebral disc 10/28/2016  . Irregular heart rate 10/28/2016  . Male stress incontinence 10/28/2016  . Myalgia 10/28/2016  . Neck pain 10/28/2016  . Occipital neuralgia 10/28/2016  .  Pain in left knee 10/28/2016  . Peripheral neuropathy 10/28/2016  . Diabetic neuropathy ([place]) 02/26/2016  . Essential hypertension 02/26/2016  . Coronary artery disease 02/26/2016  . Hyperlipidemia 02/26/2016  . Elevated troponin 02/26/2016  . Morbid obesity ([place]) 02/26/2016  . Depression with anxiety 02/26/2016  . Hypokalemia 02/26/2016  . Cardiac pacemaker in situ 02/26/2016  . CAP (community acquired pneumonia) 02/25/2016  . Multifocal pneumonia 02/25/2016  . Acute respiratory failure with hypoxia ([place]) 02/25/2016  . Ischemic cardiomyopathy 10/04/2015  . Diabetic gastroparesis associated with type 2 diabetes mellitus ([place]) 07/18/2015  . Angina at rest [facility]) 04/19/2015  . Cardiomyopathy ([place]) 03/27/2015  . Coronary artery disease involving native coronary artery of native heart with unstable angina pectoris ([place]) 03/27/2015  . Obstructive sleep apnea 03/27/2015  . Shortness of breath 03/27/2015  . Acute on chronic systolic CHF (congestive heart failure) ([place]) 06/23/2014  . Uncontrolled diabetes mellitus with complications ([place]) 11/08/3005  . Anemia 11/30/2013  . Other autoimmune hemolytic anemias 11/30/2013    Past Surgical History:  Procedure Laterality Date  . CARDIAC DEFIBRILLATOR PLACEMENT    . COLONOSCOPY WITH PROPOFOL N/A 10/26/2017   Procedure: COLONOSCOPY WITH PROPOFOL;  Surgeon: Wonda Horner, MD;  Location[facility] ENDOSCOPY;  Service: Endoscopy;  Laterality: N/A;  . CORONARY ANGIOPLASTY WITH STENT PLACEMENT  1995    x 3 stents  .  JOINT REPLACEMENT  2004   right total knee replacement  . MULTIPLE TOOTH EXTRACTIONS     Total Tooth extraction  . PACEMAKER INSERTION    . PACEMAKER PLACEMENT         Family History  Problem Relation Age of Onset  . Heart attack Mother   . Diabetes Other   . Hypertension Other   . Heart disease Other   . Arthritis Other     Social History   Tobacco Use  . Smoking status: Former Smoker    Packs/day: 1.00    Years: 20.00     Pack years: 20.00    Types: Cigarettes    Quit date: 10/23/1980    Years since quitting: 39.7  . Smokeless tobacco: Never Used  Vaping Use  . Vaping Use: Never used  Substance Use Topics  . Alcohol use: No    Alcohol/week: 0.0 standard drinks  . Drug use: No    Home Medications Prior to Admission medications   Medication Sig Start Date End Date Taking? Authorizing Provider  acetaminophen (TYLENOL) 325 MG tablet Take 650 mg by mouth 3 (three) times daily as needed (pain).     [provider]  albuterol (PROVENTIL HFA;VENTOLIN HFA) 108 (90 BASE) MCG/ACT inhaler Inhale 2 puffs into the lungs every 4 (four) hours as needed for wheezing or shortness of breath. 08/24/13   Orpah Greek, MD  aspirin EC 81 MG tablet Take 81 mg by mouth daily.    [provider]  atorvastatin (LIPITOR) 80 MG tablet Take 40 mg by mouth at bedtime.     [provider]  clopidogrel (PLAVIX) 75 MG tablet Take 75 mg by mouth daily. 02/16/16   [provider]  colchicine 0.6 MG tablet TAKE 1 TABLET BY MOUTH DAILY 04/27/20   Evelina Bucy, DPM  docusate sodium (COLACE) 100 MG capsule Take 100 mg by mouth 2 (two) times daily.    [provider]  DULoxetine (CYMBALTA) 60 MG capsule Take 60 mg by mouth daily.     [provider]  furosemide (LASIX) 40 MG tablet Take 1 tablet (40 mg total) by mouth daily. Take an extra dose if you gain 2 lbs in 24 hours. Patient taking differently: Take 40 mg by mouth See admin instructions. Take one tablet (40 mg) by mouth every morning, take an extra dose if you gain 2 lbs in 24 hours. 02/28/16   Rama, Venetia Maxon, MD  gabapentin (NEURONTIN) 400 MG capsule Take 400 mg by mouth 3 (three) times daily.     [provider]  HYDROcodone-acetaminophen (NORCO/VICODIN) 5-325 MG tablet Take 1 tablet by mouth every 4 (four) hours as needed. 05/29/18   Tegeler, Gwenyth Allegra, MD  HYDROcodone-acetaminophen (NORCO/VICODIN) 5-325 MG  tablet Take 1 tablet by mouth every 6 (six) hours as needed for moderate pain. 02/27/19   Fredia Sorrow, MD  indomethacin (INDOCIN) 50 MG capsule TAKE 1 CAPSULE(50 MG) BY MOUTH TWICE DAILY WITH A MEAL 08/02/18   Hyatt, Max T, DPM  insulin aspart (NOVOLOG) 100 UNIT/ML FlexPen Inject 25 Units into the skin 2 (two) times daily with a meal.     [provider]  Insulin Glargine (LANTUS) 100 UNIT/ML Solostar Pen Inject 48-50 Units into the skin See admin instructions. Inject 50 units subcutaneously every morning and 48 units at night    [provider]  isosorbide mononitrate (IMDUR) 30 MG 24 hr tablet Take 30 mg daily by mouth.    [provider]  lurasidone (LATUDA) 40 MG TABS tablet Take 40 mg by mouth daily with breakfast.     [provider]  Magnesium 400 MG TABS Take 400 mg by mouth daily.    [provider]  metoprolol (TOPROL-XL) 200 MG 24 hr tablet Take 100 mg by mouth daily. Take with or immediately following a meal.     [provider]  Multiple Vitamin (MULTIVITAMIN WITH MINERALS) TABS tablet Take 1 tablet by mouth daily.    [provider]  nitroGLYCERIN (NITROSTAT) 0.4 MG SL tablet Place 0.4 mg under the tongue every 5 (five) minutes as needed for chest pain.     [provider]  omeprazole (PRILOSEC) 20 MG capsule Take 40 mg by mouth daily.     [provider]  oxybutynin (DITROPAN-XL) 10 MG 24 hr tablet Take by mouth.    [provider]  oxyCODONE-acetaminophen (PERCOCET) 7.5-325 MG tablet Take 1 tablet by mouth every 6 (six) hours as needed (pain).    [provider]  polyethylene glycol (MIRALAX / GLYCOLAX) packet Take 17 g by mouth daily as needed (constipation).     [provider]  potassium chloride (K-DUR,KLOR-CON) 10 MEQ tablet Take 10 mEq by mouth daily.    [provider]  primidone (MYSOLINE) 50 MG tablet Take 50 mg by mouth 2 (two) times daily.     [provider]  sacubitril-valsartan (ENTRESTO) 49-51 MG Take 1 tablet 2 (two) times daily by mouth.    [provider]  spironolactone (ALDACTONE) 25 MG tablet Take 25 mg by mouth daily. 02/16/16   [provider]  tamsulosin (FLOMAX) 0.4 MG CAPS capsule Take 0.8 mg by mouth at bedtime.     [provider]  terbinafine (LAMISIL) 1 % cream Apply 1 application topically daily as needed for itching.    [provider]  traZODone (DESYREL) 100 MG tablet Take 100 mg by mouth at bedtime.    [provider]    Allergies    Metformin  Review of Systems   Review of Systems  Musculoskeletal: Positive for back pain.  Neurological: Negative for syncope and headaches.  All other systems reviewed and are negative.   Physical Exam Updated Vital Signs BP 116/67 (BP Location: Right Arm)   Pulse 74   Temp 98.3 F (36.8 C) (Oral)   Resp 18   Ht 5\' 6"  (1.676 m)   Wt 118.8 kg   SpO2 96%   BMI 42.29 kg/m   Physical Exam Vitals and nursing note reviewed.  Constitutional:      Appearance: He is not ill-appearing or diaphoretic.     Comments: Appears sleepy however following all commands  HENT:     Head: Normocephalic and atraumatic.     Comments: No obvious signs of head trauma Eyes:     Conjunctiva/sclera: Conjunctivae normal.  Cardiovascular:     Rate and Rhythm: Normal rate and regular rhythm.     Pulses: Normal pulses.  Pulmonary:     Effort: Pulmonary effort is normal.     Breath sounds: Normal breath sounds. No wheezing, rhonchi or rales.  Abdominal:     Palpations: Abdomen is soft.     Tenderness: There is no abdominal tenderness.  Musculoskeletal:     Cervical back: Neck supple.     Comments: Abrasion to left mid back with surrounding TTP. No obvious C, T, or L midline spinal TTP.   Skin:    General: Skin is warm  and dry.  Neurological:     Mental Status: He is alert.     ED Results / Procedures / Treatments   Labs (all labs  ordered are listed, but only abnormal results are displayed) Labs Reviewed  CBG MONITORING, ED  CBG MONITORING, ED    EKG None  Radiology CT Head Wo Contrast  Result Date: 07/18/2020 CLINICAL DATA:  Pain following fall EXAM: CT HEAD WITHOUT CONTRAST TECHNIQUE: Contiguous axial images were obtained from the base of the skull through the vertex without intravenous contrast. COMPARISON:  November 30, 2013 FINDINGS: Brain: There is mild diffuse atrophy. There is no intracranial mass, hemorrhage, extra-axial fluid collection, or midline shift. There is slight decreased attenuation in portions of the the centra semiovale bilaterally. Elsewhere brain parenchyma appears unremarkable. No appreciable acute infarct. Vascular: No hyperdense vessel. Calcification noted in each carotid siphon region. Skull: Bony calvarium appears intact. Sinuses/Orbits: There is mucosal thickening in several ethmoid air cells. Other visualized paranasal sinuses are clear. Orbits appear symmetric bilaterally. Other: Mastoid air cells are clear. IMPRESSION: Mild atrophy with mild periventricular small vessel disease. No acute infarct. No mass or hemorrhage. There are foci of arterial vascular calcification. There is mucosal thickening in several ethmoid air cells. Electronically Signed   By: Lowella Grip III M.D.   On: 07/18/2020 13:40    Procedures Procedures   Medications Ordered in ED Medications  Tdap (BOOSTRIX) injection 0.5 mL (0.5 mLs Intramuscular Given 07/18/20 1343)    ED Course  I have reviewed the triage vital signs and the nursing notes.  Pertinent labs & imaging results that were available during my care of the patient were reviewed by me and considered in my medical decision making (see chart for details).    MDM Rules/Calculators/A&P                          75 year old male presenting to the ED after fall while working in the garage; sounds mechanical. Complaining of mid back pain. Unsure regarding  head injury and is on Plavix. Denies prodrome to the fall. On arrival VSS. Pt appears to be in NAD. He has a skin abrasion to his left mid back with surrounding TTP. No midline spinal TTP. He has no obvious signs of head trauma however he appears sleepy on my exam at this time. Given he is on plavix with questionable head injury will obtain CT Head at this time. Will also obtain CBG and update tetanus for skin abrasion.   CBG 70. Pt provided with coke and crackers.  CT Head negative for acute findings  Repeat CBG 89 Will discharge patient home at this time with instructions for Tylenol PRN for pain, ice, and OTC voltaren gel PRN with close PCP follow up. Wife in agreement with plan. She reports pt is acting baseline for her currently. She was with him during the fall and denies any prodrome; pt was moving an ottoman when he lost his footing and fell. Stable for discharge.   This note was prepared using Dragon voice recognition software and may include unintentional dictation errors due to the inherent limitations of voice recognition software.  Final Clinical Impression(s) / ED Diagnoses Final diagnoses:  Fall, initial encounter  Skin abrasion  Musculoskeletal pain    Rx / DC Orders ED Discharge Orders    None       Discharge Instructions     Please rest and ice your back to help with  pain. You can take Tylenol as needed. You can also apply OTC Voltaren gel to help with pain.   Follow up with your PCP for further evaluation  Return to the ED for any new/worsening symptoms       Eustaquio Maize, PA-C 07/18/20 1446    Little, Wenda Overland, MD 07/21/20 [phone]

## 2020-07-18 NOTE — ED Notes (Signed)
Given Coke and peanut butter crackers . Wife at bedside given snack and soda as well

## 2020-07-18 NOTE — ED Notes (Signed)
Patient transported to CT 

## 2020-07-18 NOTE — Progress Notes (Signed)
   [id number]  Name:  Johnny Ochoa.    MRN: [id number] DOB: Apr 20, 1945  07/18/2020  [name] was observed post Covid-19 immunization for 15 minutes without incident. He was provided with Vaccine Information Sheet and instruction to access the V-Safe system.   [name] was instructed to call 911 with any severe reactions post vaccine: Marland Kitchen Difficulty breathing  . Swelling of face and throat  . A fast heartbeat  . A bad rash all over body  . Dizziness and weakness   Immunizations Administered    Name Date Dose VIS Date Route   PFIZER Comrnaty(Gray TOP) Covid-19 Vaccine 07/18/2020  3:08 PM 0.3 mL 01/25/2020 Intramuscular   Manufacturer: [employer]   Lot: [id number]   NDC: [phone]

## 2020-07-18 NOTE — ED Notes (Signed)
ED Provider at bedside. 

## 2020-07-18 NOTE — ED Triage Notes (Signed)
Pt states fell in garage an hour ago, struck back on a desk.  Ambulatory at baseline for patient, but painful.  Denies hitting head

## 2020-07-19 ENCOUNTER — Other Ambulatory Visit: Payer: Self-pay

## 2020-07-19 DIAGNOSIS — F4310 Post-traumatic stress disorder, unspecified: Secondary | ICD-10-CM | POA: Diagnosis present

## 2020-07-19 DIAGNOSIS — Z8546 Personal history of malignant neoplasm of prostate: Secondary | ICD-10-CM

## 2020-07-19 DIAGNOSIS — E119 Type 2 diabetes mellitus without complications: Secondary | ICD-10-CM | POA: Diagnosis present

## 2020-07-19 DIAGNOSIS — K7460 Unspecified cirrhosis of liver: Secondary | ICD-10-CM | POA: Diagnosis present

## 2020-07-19 DIAGNOSIS — W19XXXA Unspecified fall, initial encounter: Secondary | ICD-10-CM | POA: Diagnosis present

## 2020-07-19 DIAGNOSIS — I2510 Atherosclerotic heart disease of native coronary artery without angina pectoris: Secondary | ICD-10-CM | POA: Diagnosis present

## 2020-07-19 DIAGNOSIS — G4733 Obstructive sleep apnea (adult) (pediatric): Secondary | ICD-10-CM | POA: Diagnosis present

## 2020-07-19 DIAGNOSIS — Z79899 Other long term (current) drug therapy: Secondary | ICD-10-CM

## 2020-07-19 DIAGNOSIS — Z96651 Presence of right artificial knee joint: Secondary | ICD-10-CM | POA: Diagnosis present

## 2020-07-19 DIAGNOSIS — Z888 Allergy status to other drugs, medicaments and biological substances status: Secondary | ICD-10-CM

## 2020-07-19 DIAGNOSIS — Z87891 Personal history of nicotine dependence: Secondary | ICD-10-CM

## 2020-07-19 DIAGNOSIS — Z833 Family history of diabetes mellitus: Secondary | ICD-10-CM

## 2020-07-19 DIAGNOSIS — H538 Other visual disturbances: Secondary | ICD-10-CM | POA: Diagnosis present

## 2020-07-19 DIAGNOSIS — I110 Hypertensive heart disease with heart failure: Secondary | ICD-10-CM | POA: Diagnosis present

## 2020-07-19 DIAGNOSIS — I25110 Atherosclerotic heart disease of native coronary artery with unstable angina pectoris: Principal | ICD-10-CM | POA: Diagnosis present

## 2020-07-19 DIAGNOSIS — Z95810 Presence of automatic (implantable) cardiac defibrillator: Secondary | ICD-10-CM

## 2020-07-19 DIAGNOSIS — Z20822 Contact with and (suspected) exposure to covid-19: Secondary | ICD-10-CM | POA: Diagnosis present

## 2020-07-19 DIAGNOSIS — H9190 Unspecified hearing loss, unspecified ear: Secondary | ICD-10-CM | POA: Diagnosis present

## 2020-07-19 DIAGNOSIS — M5136 Other intervertebral disc degeneration, lumbar region: Secondary | ICD-10-CM | POA: Diagnosis present

## 2020-07-19 DIAGNOSIS — Z794 Long term (current) use of insulin: Secondary | ICD-10-CM

## 2020-07-19 DIAGNOSIS — Z955 Presence of coronary angioplasty implant and graft: Secondary | ICD-10-CM

## 2020-07-19 DIAGNOSIS — F32A Depression, unspecified: Secondary | ICD-10-CM | POA: Diagnosis present

## 2020-07-19 DIAGNOSIS — Z7982 Long term (current) use of aspirin: Secondary | ICD-10-CM

## 2020-07-19 DIAGNOSIS — K219 Gastro-esophageal reflux disease without esophagitis: Secondary | ICD-10-CM | POA: Diagnosis present

## 2020-07-19 DIAGNOSIS — Z23 Encounter for immunization: Secondary | ICD-10-CM

## 2020-07-19 DIAGNOSIS — I252 Old myocardial infarction: Secondary | ICD-10-CM

## 2020-07-19 DIAGNOSIS — I5042 Chronic combined systolic (congestive) and diastolic (congestive) heart failure: Secondary | ICD-10-CM | POA: Diagnosis present

## 2020-07-19 DIAGNOSIS — E785 Hyperlipidemia, unspecified: Secondary | ICD-10-CM | POA: Diagnosis present

## 2020-07-19 DIAGNOSIS — Z923 Personal history of irradiation: Secondary | ICD-10-CM

## 2020-07-19 DIAGNOSIS — Z87442 Personal history of urinary calculi: Secondary | ICD-10-CM

## 2020-07-19 DIAGNOSIS — Z6841 Body Mass Index (BMI) 40.0 and over, adult: Secondary | ICD-10-CM

## 2020-07-19 DIAGNOSIS — Z7902 Long term (current) use of antithrombotics/antiplatelets: Secondary | ICD-10-CM

## 2020-07-19 DIAGNOSIS — M109 Gout, unspecified: Secondary | ICD-10-CM | POA: Diagnosis present

## 2020-07-19 DIAGNOSIS — F419 Anxiety disorder, unspecified: Secondary | ICD-10-CM | POA: Diagnosis present

## 2020-07-19 DIAGNOSIS — Z8249 Family history of ischemic heart disease and other diseases of the circulatory system: Secondary | ICD-10-CM

## 2020-07-20 ENCOUNTER — Inpatient Hospital Stay (HOSPITAL_BASED_OUTPATIENT_CLINIC_OR_DEPARTMENT_OTHER)
Admission: EM | Admit: 2020-07-20 | Discharge: 2020-07-22 | DRG: 287 | Disposition: A | Discharge status: Home / Self Care | Payer: Medicare Other | Attending: Internal Medicine | Admitting: Internal Medicine

## 2020-07-20 ENCOUNTER — Emergency Department (HOSPITAL_BASED_OUTPATIENT_CLINIC_OR_DEPARTMENT_OTHER): Payer: Medicare Other

## 2020-07-20 ENCOUNTER — Encounter (HOSPITAL_BASED_OUTPATIENT_CLINIC_OR_DEPARTMENT_OTHER): Payer: Self-pay | Admitting: *Deleted

## 2020-07-20 DIAGNOSIS — Z950 Presence of cardiac pacemaker: Secondary | ICD-10-CM | POA: Diagnosis not present

## 2020-07-20 DIAGNOSIS — Z8679 Personal history of other diseases of the circulatory system: Secondary | ICD-10-CM

## 2020-07-20 DIAGNOSIS — G4733 Obstructive sleep apnea (adult) (pediatric): Secondary | ICD-10-CM

## 2020-07-20 DIAGNOSIS — W19XXXA Unspecified fall, initial encounter: Secondary | ICD-10-CM | POA: Diagnosis present

## 2020-07-20 DIAGNOSIS — R079 Chest pain, unspecified: Secondary | ICD-10-CM | POA: Diagnosis present

## 2020-07-20 DIAGNOSIS — I110 Hypertensive heart disease with heart failure: Secondary | ICD-10-CM | POA: Diagnosis present

## 2020-07-20 DIAGNOSIS — I5042 Chronic combined systolic (congestive) and diastolic (congestive) heart failure: Secondary | ICD-10-CM | POA: Diagnosis present

## 2020-07-20 DIAGNOSIS — F4310 Post-traumatic stress disorder, unspecified: Secondary | ICD-10-CM | POA: Diagnosis present

## 2020-07-20 DIAGNOSIS — I200 Unstable angina: Secondary | ICD-10-CM | POA: Diagnosis present

## 2020-07-20 DIAGNOSIS — K219 Gastro-esophageal reflux disease without esophagitis: Secondary | ICD-10-CM | POA: Diagnosis present

## 2020-07-20 DIAGNOSIS — Z96651 Presence of right artificial knee joint: Secondary | ICD-10-CM | POA: Diagnosis present

## 2020-07-20 DIAGNOSIS — E119 Type 2 diabetes mellitus without complications: Secondary | ICD-10-CM | POA: Diagnosis present

## 2020-07-20 DIAGNOSIS — F419 Anxiety disorder, unspecified: Secondary | ICD-10-CM | POA: Diagnosis present

## 2020-07-20 DIAGNOSIS — K7460 Unspecified cirrhosis of liver: Secondary | ICD-10-CM | POA: Diagnosis present

## 2020-07-20 DIAGNOSIS — Z9989 Dependence on other enabling machines and devices: Secondary | ICD-10-CM | POA: Diagnosis not present

## 2020-07-20 DIAGNOSIS — I2510 Atherosclerotic heart disease of native coronary artery without angina pectoris: Secondary | ICD-10-CM | POA: Diagnosis present

## 2020-07-20 DIAGNOSIS — M5136 Other intervertebral disc degeneration, lumbar region: Secondary | ICD-10-CM | POA: Diagnosis present

## 2020-07-20 DIAGNOSIS — H9190 Unspecified hearing loss, unspecified ear: Secondary | ICD-10-CM | POA: Diagnosis present

## 2020-07-20 DIAGNOSIS — M109 Gout, unspecified: Secondary | ICD-10-CM | POA: Diagnosis present

## 2020-07-20 DIAGNOSIS — Z23 Encounter for immunization: Secondary | ICD-10-CM | POA: Diagnosis present

## 2020-07-20 DIAGNOSIS — Z95810 Presence of automatic (implantable) cardiac defibrillator: Secondary | ICD-10-CM | POA: Diagnosis not present

## 2020-07-20 DIAGNOSIS — I25110 Atherosclerotic heart disease of native coronary artery with unstable angina pectoris: Secondary | ICD-10-CM | POA: Diagnosis present

## 2020-07-20 DIAGNOSIS — H538 Other visual disturbances: Secondary | ICD-10-CM | POA: Diagnosis present

## 2020-07-20 DIAGNOSIS — Z955 Presence of coronary angioplasty implant and graft: Secondary | ICD-10-CM | POA: Diagnosis not present

## 2020-07-20 DIAGNOSIS — F418 Other specified anxiety disorders: Secondary | ICD-10-CM | POA: Diagnosis present

## 2020-07-20 DIAGNOSIS — E785 Hyperlipidemia, unspecified: Secondary | ICD-10-CM | POA: Diagnosis present

## 2020-07-20 DIAGNOSIS — Z20822 Contact with and (suspected) exposure to covid-19: Secondary | ICD-10-CM | POA: Diagnosis present

## 2020-07-20 DIAGNOSIS — F32A Depression, unspecified: Secondary | ICD-10-CM | POA: Diagnosis present

## 2020-07-20 DIAGNOSIS — I252 Old myocardial infarction: Secondary | ICD-10-CM | POA: Diagnosis not present

## 2020-07-20 DIAGNOSIS — Z6841 Body Mass Index (BMI) 40.0 and over, adult: Secondary | ICD-10-CM | POA: Diagnosis not present

## 2020-07-20 DIAGNOSIS — I10 Essential (primary) hypertension: Secondary | ICD-10-CM | POA: Diagnosis present

## 2020-07-20 LAB — BASIC METABOLIC PANEL
Anion gap: 8 (ref 5–15)
BUN: 14 mg/dL (ref 8–23)
CO2: 25 mmol/L (ref 22–32)
Calcium: 8.5 mg/dL — ABNORMAL LOW (ref 8.9–10.3)
Chloride: 105 mmol/L (ref 98–111)
Creatinine, Ser: 0.97 mg/dL (ref 0.61–1.24)
GFR, Estimated: >60 mL/min (ref >60)
Glucose, Bld: 118 mg/dL — ABNORMAL HIGH (ref 70–99)
Potassium: 3.5 mmol/L (ref 3.5–5.1)
Sodium: 138 mmol/L (ref 135–145)

## 2020-07-20 LAB — HEPATIC FUNCTION PANEL
ALT: 27 U/L (ref 0–44)
AST: 30 U/L (ref 15–41)
Albumin: 3.5 g/dL (ref 3.5–5.0)
Alkaline Phosphatase: 71 U/L (ref 38–126)
Bilirubin, Direct: 0.1 mg/dL (ref 0.0–0.2)
Indirect Bilirubin: 0.2 mg/dL — ABNORMAL LOW (ref 0.3–0.9)
Total Bilirubin: 0.3 mg/dL (ref 0.3–1.2)
Total Protein: 6.9 g/dL (ref 6.5–8.1)

## 2020-07-20 LAB — RESP PANEL BY RT-PCR (FLU A&B, COVID) ARPGX2
Influenza A by PCR: NEGATIVE
Influenza B by PCR: NEGATIVE
SARS Coronavirus 2 by RT PCR: NEGATIVE

## 2020-07-20 LAB — TROPONIN I (HIGH SENSITIVITY)
Troponin I (High Sensitivity): 10 ng/L (ref <18)
Troponin I (High Sensitivity): 11 ng/L (ref <18)

## 2020-07-20 LAB — CBC
HCT: 37.8 % — ABNORMAL LOW (ref 39.0–52.0)
Hemoglobin: 12.5 g/dL — ABNORMAL LOW (ref 13.0–17.0)
MCH: 32.3 pg (ref 26.0–34.0)
MCHC: 33.1 g/dL (ref 30.0–36.0)
MCV: 97.7 fL (ref 80.0–100.0)
Platelets: 174 10*3/uL (ref 150–400)
RBC: 3.87 MIL/uL — ABNORMAL LOW (ref 4.22–5.81)
RDW: 14.4 % (ref 11.5–15.5)
WBC: 3.8 10*3/uL — ABNORMAL LOW (ref 4.0–10.5)
nRBC: 0 % (ref 0.0–0.2)

## 2020-07-20 LAB — GLUCOSE, CAPILLARY
Glucose-Capillary: 69 mg/dL — ABNORMAL LOW (ref 70–99)
Glucose-Capillary: 152 mg/dL — ABNORMAL HIGH (ref 70–99)
Glucose-Capillary: 127 mg/dL — ABNORMAL HIGH (ref 70–99)

## 2020-07-20 LAB — LIPID PANEL
Cholesterol: 136 mg/dL (ref 0–200)
HDL: 54 mg/dL (ref >40)
LDL Cholesterol: 69 mg/dL (ref 0–99)
Total CHOL/HDL Ratio: 2.5 RATIO
Triglycerides: 63 mg/dL (ref <150)
VLDL: 13 mg/dL (ref 0–40)

## 2020-07-20 LAB — TSH: TSH: 1.367 u[IU]/mL (ref 0.350–4.500)

## 2020-07-20 LAB — BRAIN NATRIURETIC PEPTIDE: B Natriuretic Peptide: 81.1 pg/mL (ref 0.0–100.0)

## 2020-07-20 LAB — CBG MONITORING, ED: Glucose-Capillary: 109 mg/dL — ABNORMAL HIGH (ref 70–99)

## 2020-07-20 LAB — LIPASE, BLOOD: Lipase: 38 U/L (ref 11–51)

## 2020-07-20 MED ORDER — MAGNESIUM OXIDE 400 (241.3 MG) MG PO TABS
400.0000 mg | ORAL_TABLET | Freq: Every day | ORAL | Status: DC
  Filled 2020-07-20: qty 1

## 2020-07-20 MED ORDER — ENOXAPARIN SODIUM 40 MG/0.4ML IJ SOSY
40.0000 mg | PREFILLED_SYRINGE | INTRAMUSCULAR | Status: DC
  Administered 2020-07-20 – 2020-07-21 (×2): 40 mg via SUBCUTANEOUS
  Filled 2020-07-20 (×2): qty 0.4

## 2020-07-20 MED ORDER — COLCHICINE 0.6 MG PO TABS
0.6000 mg | ORAL_TABLET | Freq: Every day | ORAL | Status: DC
  Administered 2020-07-20 – 2020-07-22 (×3): 0.6 mg via ORAL
  Filled 2020-07-20 (×3): qty 1

## 2020-07-20 MED ORDER — GABAPENTIN 400 MG PO CAPS
400.0000 mg | ORAL_CAPSULE | Freq: Three times a day (TID) | ORAL | Status: DC
  Administered 2020-07-20 – 2020-07-22 (×6): 400 mg via ORAL
  Filled 2020-07-20 (×6): qty 1

## 2020-07-20 MED ORDER — ONDANSETRON HCL 4 MG/2ML IJ SOLN
4.0000 mg | Freq: Once | INTRAMUSCULAR | Status: AC
  Administered 2020-07-20: 4 mg via INTRAVENOUS
  Filled 2020-07-20: qty 2

## 2020-07-20 MED ORDER — SACUBITRIL-VALSARTAN 49-51 MG PO TABS
1.0000 | ORAL_TABLET | Freq: Two times a day (BID) | ORAL | Status: DC
  Administered 2020-07-20 – 2020-07-22 (×5): 1 via ORAL
  Filled 2020-07-20 (×5): qty 1

## 2020-07-20 MED ORDER — INSULIN ASPART 100 UNIT/ML IJ SOLN: 0.0000 [IU] | Freq: Every day | INTRAMUSCULAR | Status: DC

## 2020-07-20 MED ORDER — METOPROLOL SUCCINATE ER 100 MG PO TB24
100.0000 mg | ORAL_TABLET | Freq: Every day | ORAL | Status: DC
  Administered 2020-07-20 – 2020-07-22 (×3): 100 mg via ORAL
  Filled 2020-07-20 (×3): qty 1

## 2020-07-20 MED ORDER — SODIUM CHLORIDE 0.9% FLUSH
3.0000 mL | Freq: Two times a day (BID) | INTRAVENOUS | Status: DC
  Administered 2020-07-20 – 2020-07-21 (×4): 3 mL via INTRAVENOUS

## 2020-07-20 MED ORDER — INSULIN GLARGINE 100 UNIT/ML ~~LOC~~ SOLN
18.0000 [IU] | Freq: Two times a day (BID) | SUBCUTANEOUS | Status: DC
  Administered 2020-07-20 – 2020-07-22 (×3): 18 [IU] via SUBCUTANEOUS
  Filled 2020-07-20 (×6): qty 0.18

## 2020-07-20 MED ORDER — TRAZODONE HCL 100 MG PO TABS
100.0000 mg | ORAL_TABLET | Freq: Every day | ORAL | Status: DC
  Administered 2020-07-20 – 2020-07-21 (×2): 100 mg via ORAL
  Filled 2020-07-20 (×2): qty 1

## 2020-07-20 MED ORDER — FUROSEMIDE 40 MG PO TABS
40.0000 mg | ORAL_TABLET | Freq: Every day | ORAL | Status: DC
  Administered 2020-07-20 – 2020-07-22 (×3): 40 mg via ORAL
  Filled 2020-07-20 (×3): qty 1

## 2020-07-20 MED ORDER — PANTOPRAZOLE SODIUM 40 MG PO TBEC
40.0000 mg | DELAYED_RELEASE_TABLET | Freq: Every day | ORAL | Status: DC
  Administered 2020-07-20 – 2020-07-22 (×3): 40 mg via ORAL
  Filled 2020-07-20 (×3): qty 1

## 2020-07-20 MED ORDER — SODIUM CHLORIDE 0.9% FLUSH: 3.0000 mL | INTRAVENOUS | Status: DC | PRN

## 2020-07-20 MED ORDER — NITROGLYCERIN 0.4 MG SL SUBL: 0.4000 mg | SUBLINGUAL_TABLET | SUBLINGUAL | Status: DC | PRN

## 2020-07-20 MED ORDER — PRIMIDONE 50 MG PO TABS
50.0000 mg | ORAL_TABLET | Freq: Two times a day (BID) | ORAL | Status: DC
  Administered 2020-07-20 – 2020-07-22 (×5): 50 mg via ORAL
  Filled 2020-07-20 (×6): qty 1

## 2020-07-20 MED ORDER — ISOSORBIDE MONONITRATE ER 30 MG PO TB24
30.0000 mg | ORAL_TABLET | Freq: Every day | ORAL | Status: DC
  Administered 2020-07-20 – 2020-07-22 (×3): 30 mg via ORAL
  Filled 2020-07-20 (×3): qty 1

## 2020-07-20 MED ORDER — SODIUM CHLORIDE 0.9 % IV SOLN: 250.0000 mL | INTRAVENOUS | Status: DC | PRN

## 2020-07-20 MED ORDER — IOHEXOL 350 MG/ML SOLN
100.0000 mL | Freq: Once | INTRAVENOUS | Status: AC | PRN
  Administered 2020-07-20: 100 mL via INTRAVENOUS

## 2020-07-20 MED ORDER — ACETAMINOPHEN 325 MG PO TABS: 650.0000 mg | ORAL_TABLET | ORAL | Status: DC | PRN

## 2020-07-20 MED ORDER — ONDANSETRON HCL 4 MG/2ML IJ SOLN: 4.0000 mg | Freq: Four times a day (QID) | INTRAMUSCULAR | Status: DC | PRN

## 2020-07-20 MED ORDER — POTASSIUM CHLORIDE CRYS ER 10 MEQ PO TBCR
10.0000 meq | EXTENDED_RELEASE_TABLET | Freq: Every day | ORAL | Status: DC
  Administered 2020-07-20 – 2020-07-22 (×3): 10 meq via ORAL
  Filled 2020-07-20 (×3): qty 1

## 2020-07-20 MED ORDER — ALBUTEROL SULFATE (2.5 MG/3ML) 0.083% IN NEBU: 3.0000 mL | INHALATION_SOLUTION | RESPIRATORY_TRACT | Status: DC | PRN

## 2020-07-20 MED ORDER — SPIRONOLACTONE 25 MG PO TABS
25.0000 mg | ORAL_TABLET | Freq: Every day | ORAL | Status: DC
  Administered 2020-07-20 – 2020-07-22 (×3): 25 mg via ORAL
  Filled 2020-07-20 (×3): qty 1

## 2020-07-20 MED ORDER — DULOXETINE HCL 60 MG PO CPEP
60.0000 mg | ORAL_CAPSULE | Freq: Every day | ORAL | Status: DC
  Administered 2020-07-20 – 2020-07-22 (×3): 60 mg via ORAL
  Filled 2020-07-20 (×3): qty 1

## 2020-07-20 MED ORDER — TAMSULOSIN HCL 0.4 MG PO CAPS
0.8000 mg | ORAL_CAPSULE | Freq: Every day | ORAL | Status: DC
  Administered 2020-07-20 – 2020-07-21 (×2): 0.8 mg via ORAL
  Filled 2020-07-20 (×2): qty 2

## 2020-07-20 MED ORDER — ATORVASTATIN CALCIUM 40 MG PO TABS
40.0000 mg | ORAL_TABLET | Freq: Every day | ORAL | Status: DC
  Administered 2020-07-20 – 2020-07-21 (×2): 40 mg via ORAL
  Filled 2020-07-20 (×2): qty 1

## 2020-07-20 MED ORDER — MIRABEGRON ER 25 MG PO TB24
25.0000 mg | ORAL_TABLET | Freq: Every day | ORAL | Status: DC
  Administered 2020-07-20 – 2020-07-22 (×3): 25 mg via ORAL
  Filled 2020-07-20 (×3): qty 1

## 2020-07-20 MED ORDER — MAGNESIUM OXIDE -MG SUPPLEMENT 400 (240 MG) MG PO TABS
400.0000 mg | ORAL_TABLET | Freq: Every day | ORAL | Status: DC
  Administered 2020-07-20 – 2020-07-22 (×3): 400 mg via ORAL
  Filled 2020-07-20 (×3): qty 1

## 2020-07-20 MED ORDER — POLYETHYLENE GLYCOL 3350 17 G PO PACK: 17.0000 g | PACK | Freq: Every day | ORAL | Status: DC | PRN

## 2020-07-20 MED ORDER — INSULIN GLARGINE 100 UNIT/ML SOLOSTAR PEN: 18.0000 [IU] | PEN_INJECTOR | Freq: Two times a day (BID) | SUBCUTANEOUS | Status: DC

## 2020-07-20 MED ORDER — CLOPIDOGREL BISULFATE 75 MG PO TABS
75.0000 mg | ORAL_TABLET | Freq: Every day | ORAL | Status: DC
  Administered 2020-07-20 – 2020-07-22 (×3): 75 mg via ORAL
  Filled 2020-07-20 (×3): qty 1

## 2020-07-20 MED ORDER — ASPIRIN EC 81 MG PO TBEC
81.0000 mg | DELAYED_RELEASE_TABLET | Freq: Every day | ORAL | Status: DC
  Administered 2020-07-20 – 2020-07-21 (×2): 81 mg via ORAL
  Filled 2020-07-20 (×3): qty 1

## 2020-07-20 MED ORDER — INSULIN ASPART 100 UNIT/ML IJ SOLN
0.0000 [IU] | Freq: Three times a day (TID) | INTRAMUSCULAR | Status: DC
  Administered 2020-07-20: 3 [IU] via SUBCUTANEOUS
  Administered 2020-07-21 – 2020-07-22 (×3): 2 [IU] via SUBCUTANEOUS

## 2020-07-20 NOTE — ED Provider Notes (Signed)
[facility] EMERGENCY DEPARTMENT Provider Note   CSN: [id number] Arrival date & time: 07/19/20  2353     History Chief Complaint  Patient presents with  . Chest Pain    Johnny Ochoa. is a 75 y.o. male.  Patient is a 75 year old male with extensive past medical history including coronary artery disease with stents, prior MI, diabetes, congestive heart failure, ischemic cardiomyopathy with pacer/defibrillator.  Patient presenting today for evaluation of chest pain, nausea, and vomiting.  This started acutely at home this evening.  Patient became very weak, pale, and diaphoretic.  He denies any aggravating or alleviating factors, but had little relief with sublingual nitroglycerin at home.  Of note is that patient was seen here yesterday after a fall.  He apparently fell backward and hit his head.  He had a head CT performed that was unremarkable and patient discharged to home.  He does describe some pain in his upper and lower back since the fall.  The history is provided by the patient.  Chest Pain Pain location:  Substernal area Pain quality: tightness   Pain radiates to:  Does not radiate Pain severity:  Moderate Onset quality:  Sudden Timing:  Constant Progression:  Worsening Chronicity:  New      Past Medical History:  Diagnosis Date  . Cancer [facility])    prostate  (Radiation only)  . CHF (congestive heart failure) ([place])   . Diabetes mellitus   . Hypertension   . Kidney stones   . Leg swelling   . Low back pain   . Myocardial infarction ([place]) 12/09/1993  . Obese   . OSA (obstructive sleep apnea)   . Peripheral neuropathy   . PTSD (post-traumatic stress disorder)     Patient Active Problem List   Diagnosis Date Noted  . Acute gout 08/16/2018  . Anesthesia complication 78/67/6720  . Hearing loss 08/02/2018  . GERD (gastroesophageal reflux disease) 08/02/2018  . Osteoarthritis of left knee 01/31/2018  . Blood in stool 10/22/2017  . Liver cirrhosis  ([place]) 10/22/2017  . GI bleed 10/22/2017  . Anxiety 10/28/2016  . Arthritis 10/28/2016  . Bradycardia 10/28/2016  . Degeneration of lumbar intervertebral disc 10/28/2016  . Irregular heart rate 10/28/2016  . Male stress incontinence 10/28/2016  . Myalgia 10/28/2016  . Neck pain 10/28/2016  . Occipital neuralgia 10/28/2016  . Pain in left knee 10/28/2016  . Peripheral neuropathy 10/28/2016  . Diabetic neuropathy ([place]) 02/26/2016  . Essential hypertension 02/26/2016  . Coronary artery disease 02/26/2016  . Hyperlipidemia 02/26/2016  . Elevated troponin 02/26/2016  . Morbid obesity ([place]) 02/26/2016  . Depression with anxiety 02/26/2016  . Hypokalemia 02/26/2016  . Cardiac pacemaker in situ 02/26/2016  . CAP (community acquired pneumonia) 02/25/2016  . Multifocal pneumonia 02/25/2016  . Acute respiratory failure with hypoxia ([place]) 02/25/2016  . Ischemic cardiomyopathy 10/04/2015  . Diabetic gastroparesis associated with type 2 diabetes mellitus ([place]) 07/18/2015  . Angina at rest [facility]) 04/19/2015  . Cardiomyopathy ([place]) 03/27/2015  . Coronary artery disease involving native coronary artery of native heart with unstable angina pectoris ([place]) 03/27/2015  . Obstructive sleep apnea 03/27/2015  . Shortness of breath 03/27/2015  . Acute on chronic systolic CHF (congestive heart failure) ([place]) 06/23/2014  . Uncontrolled diabetes mellitus with complications ([place]) 94/70/9628  . Anemia 11/30/2013  . Other autoimmune hemolytic anemias 11/30/2013    Past Surgical History:  Procedure Laterality Date  . CARDIAC DEFIBRILLATOR PLACEMENT    . COLONOSCOPY WITH PROPOFOL N/A 10/26/2017  Procedure: COLONOSCOPY WITH PROPOFOL;  Surgeon: Wonda Horner, MD;  Location: [facility] ENDOSCOPY;  Service: Endoscopy;  Laterality: N/A;  . CORONARY ANGIOPLASTY WITH STENT PLACEMENT  1995    x 3 stents  . JOINT REPLACEMENT  2004   right total knee replacement  . MULTIPLE TOOTH EXTRACTIONS     Total Tooth extraction   . PACEMAKER INSERTION    . PACEMAKER PLACEMENT         Family History  Problem Relation Age of Onset  . Heart attack Mother   . Diabetes Other   . Hypertension Other   . Heart disease Other   . Arthritis Other     Social History   Tobacco Use  . Smoking status: Former Smoker    Packs/day: 1.00    Years: 20.00    Pack years: 20.00    Types: Cigarettes    Quit date: 10/23/1980    Years since quitting: 39.7  . Smokeless tobacco: Never Used  Vaping Use  . Vaping Use: Never used  Substance Use Topics  . Alcohol use: No    Alcohol/week: 0.0 standard drinks  . Drug use: No    Home Medications Prior to Admission medications   Medication Sig Start Date End Date Taking? Authorizing Provider  acetaminophen (TYLENOL) 325 MG tablet Take 650 mg by mouth 3 (three) times daily as needed (pain).     [provider]  albuterol (PROVENTIL HFA;VENTOLIN HFA) 108 (90 BASE) MCG/ACT inhaler Inhale 2 puffs into the lungs every 4 (four) hours as needed for wheezing or shortness of breath. 08/24/13   Orpah Greek, MD  aspirin EC 81 MG tablet Take 81 mg by mouth daily.    [provider]  atorvastatin (LIPITOR) 80 MG tablet Take 40 mg by mouth at bedtime.     [provider]  clopidogrel (PLAVIX) 75 MG tablet Take 75 mg by mouth daily. 02/16/16   [provider]  colchicine 0.6 MG tablet TAKE 1 TABLET BY MOUTH DAILY 04/27/20   Evelina Bucy, DPM  docusate sodium (COLACE) 100 MG capsule Take 100 mg by mouth 2 (two) times daily.    [provider]  DULoxetine (CYMBALTA) 60 MG capsule Take 60 mg by mouth daily.     [provider]  furosemide (LASIX) 40 MG tablet Take 1 tablet (40 mg total) by mouth daily. Take an extra dose if you gain 2 lbs in 24 hours. Patient taking differently: Take 40 mg by mouth See admin instructions. Take one tablet (40 mg) by mouth every morning, take an extra dose if you gain 2 lbs in 24 hours. 02/28/16   Rama,  Venetia Maxon, MD  gabapentin (NEURONTIN) 400 MG capsule Take 400 mg by mouth 3 (three) times daily.     [provider]  HYDROcodone-acetaminophen (NORCO/VICODIN) 5-325 MG tablet Take 1 tablet by mouth every 4 (four) hours as needed. 05/29/18   Tegeler, Gwenyth Allegra, MD  HYDROcodone-acetaminophen (NORCO/VICODIN) 5-325 MG tablet Take 1 tablet by mouth every 6 (six) hours as needed for moderate pain. 02/27/19   Fredia Sorrow, MD  indomethacin (INDOCIN) 50 MG capsule TAKE 1 CAPSULE(50 MG) BY MOUTH TWICE DAILY WITH A MEAL 08/02/18   Hyatt, Max T, DPM  insulin aspart (NOVOLOG) 100 UNIT/ML FlexPen Inject 25 Units into the skin 2 (two) times daily with a meal.     [provider]  Insulin Glargine (LANTUS) 100 UNIT/ML Solostar Pen Inject 48-50 Units into the skin See admin instructions.  Inject 50 units subcutaneously every morning and 48 units at night    [provider]  isosorbide mononitrate (IMDUR) 30 MG 24 hr tablet Take 30 mg daily by mouth.    [provider]  lurasidone (LATUDA) 40 MG TABS tablet Take 40 mg by mouth daily with breakfast.     [provider]  Magnesium 400 MG TABS Take 400 mg by mouth daily.    [provider]  metoprolol (TOPROL-XL) 200 MG 24 hr tablet Take 100 mg by mouth daily. Take with or immediately following a meal.     [provider]  Multiple Vitamin (MULTIVITAMIN WITH MINERALS) TABS tablet Take 1 tablet by mouth daily.    [provider]  nitroGLYCERIN (NITROSTAT) 0.4 MG SL tablet Place 0.4 mg under the tongue every 5 (five) minutes as needed for chest pain.     [provider]  omeprazole (PRILOSEC) 20 MG capsule Take 40 mg by mouth daily.     [provider]  oxybutynin (DITROPAN-XL) 10 MG 24 hr tablet Take by mouth.    [provider]  oxyCODONE-acetaminophen (PERCOCET) 7.5-325 MG tablet Take 1 tablet by mouth every 6 (six) hours as needed (pain).    [provider]  polyethylene glycol (MIRALAX / GLYCOLAX) packet Take 17 g by mouth daily as needed (constipation).     [provider]  potassium chloride (K-DUR,KLOR-CON) 10 MEQ tablet Take 10 mEq by mouth daily.    [provider]  primidone (MYSOLINE) 50 MG tablet Take 50 mg by mouth 2 (two) times daily.     [provider]  sacubitril-valsartan (ENTRESTO) 49-51 MG Take 1 tablet 2 (two) times daily by mouth.    [provider]  spironolactone (ALDACTONE) 25 MG tablet Take 25 mg by mouth daily. 02/16/16   [provider]  tamsulosin (FLOMAX) 0.4 MG CAPS capsule Take 0.8 mg by mouth at bedtime.     [provider]  terbinafine (LAMISIL) 1 % cream Apply 1 application topically daily as needed for itching.    [provider]  traZODone (DESYREL) 100 MG tablet Take 100 mg by mouth at bedtime.    [provider]    Allergies    Metformin  Review of Systems   Review of Systems  Cardiovascular: Positive for chest pain.  All other systems reviewed and are negative.   Physical Exam Updated Vital Signs BP (!) 152/82   Pulse 71   Resp 16   Ht 5' 6.5" (1.689 m)   Wt 118.8 kg   SpO2 92%   BMI 41.65 kg/m   Physical Exam Vitals and nursing note reviewed.  Constitutional:      General: He is not in acute distress.    Appearance: He is well-developed. He is not diaphoretic.     Comments: Patient is awake and alert, but does appear sluggish, diaphoretic, and pale.  He reports being nauseated and did actively vomit.  HENT:     Head: Normocephalic and atraumatic.  Cardiovascular:     Rate and Rhythm: Normal rate and regular rhythm.     Heart sounds: No murmur heard. No friction rub.  Pulmonary:     Effort: Pulmonary effort is normal. No respiratory distress.     Breath sounds: Normal breath sounds. No wheezing or rales.  Abdominal:     General: Bowel sounds are normal. There is no distension.     Palpations: Abdomen is soft.      Tenderness:  There is no abdominal tenderness.  Musculoskeletal:        General: Normal range of motion.     Cervical back: Normal range of motion and neck supple.  Skin:    Comments: Skin is pale, cool, and diaphoresis is present.  Neurological:     Mental Status: He is alert and oriented to person, place, and time.     Coordination: Coordination normal.     ED Results / Procedures / Treatments   Labs (all labs ordered are listed, but only abnormal results are displayed) Labs Reviewed  CBG MONITORING, ED - Abnormal; Notable for the following components:      Result Value   Glucose-Capillary 109 (*)    All other components within normal limits  BASIC METABOLIC PANEL  CBC  LIPASE, BLOOD  HEPATIC FUNCTION PANEL  TROPONIN I (HIGH SENSITIVITY)    EKG None  Radiology CT Head Wo Contrast  Result Date: 07/18/2020 CLINICAL DATA:  Pain following fall EXAM: CT HEAD WITHOUT CONTRAST TECHNIQUE: Contiguous axial images were obtained from the base of the skull through the vertex without intravenous contrast. COMPARISON:  November 30, 2013 FINDINGS: Brain: There is mild diffuse atrophy. There is no intracranial mass, hemorrhage, extra-axial fluid collection, or midline shift. There is slight decreased attenuation in portions of the the centra semiovale bilaterally. Elsewhere brain parenchyma appears unremarkable. No appreciable acute infarct. Vascular: No hyperdense vessel. Calcification noted in each carotid siphon region. Skull: Bony calvarium appears intact. Sinuses/Orbits: There is mucosal thickening in several ethmoid air cells. Other visualized paranasal sinuses are clear. Orbits appear symmetric bilaterally. Other: Mastoid air cells are clear. IMPRESSION: Mild atrophy with mild periventricular small vessel disease. No acute infarct. No mass or hemorrhage. There are foci of arterial vascular calcification. There is mucosal thickening in several ethmoid air cells. Electronically Signed   By:  Lowella Grip III M.D.   On: 07/18/2020 13:40    Procedures Procedures   Medications Ordered in ED Medications  ondansetron (ZOFRAN) injection 4 mg (4 mg Intravenous Given 07/20/20 0015)    ED Course  I have reviewed the triage vital signs and the nursing notes.  Pertinent labs & imaging results that were available during my care of the patient were reviewed by me and considered in my medical decision making (see chart for details).    MDM Rules/Calculators/A&P  Patient extensive past medical history as per HPI presenting with complaints of chest pain.  Patient was standing at the counter with his wife when he became tight in the chest, pale, and diaphoretic.  He vomited at home, then vomited again here.  Patient arrived here appearing quite unwell, but had normal vital signs and is afebrile.  He was taken immediately into room 14 where he was immediately assessed.  Patient was diaphoretic and pale appearing, but initial EKG showed no ischemic changes.  Laboratory studies were obtained and chest x-ray was performed.  I also obtained a CT scan of the chest, abdomen, and pelvis as the patient is on Plavix and had a fall yesterday.  All of the studies have returned basically unremarkable.  Due to patient's extensive cardiac history, I feel as though admission for observation is indicated.  I have spoken with Dr. Cyd Silence from the hospitalist service who agrees to admit.  CRITICAL CARE Performed by: Veryl Speak Total critical care time: 35 minutes Critical care time was exclusive of separately billable procedures and treating other patients. Critical care was necessary to treat or prevent imminent or life-threatening  deterioration. Critical care was time spent personally by me on the following activities: development of treatment plan with patient and/or surrogate as well as nursing, discussions with consultants, evaluation of patient's response to treatment, examination of patient,  obtaining history from patient or surrogate, ordering and performing treatments and interventions, ordering and review of laboratory studies, ordering and review of radiographic studies, pulse oximetry and re-evaluation of patient's condition.   Final Clinical Impression(s) / ED Diagnoses Final diagnoses:  Chest pain    Rx / DC Orders ED Discharge Orders    None       Veryl Speak, MD 07/20/20 [phone]

## 2020-07-20 NOTE — Progress Notes (Signed)
CBG 69, pt asymptomatic, eating lunch at this time.

## 2020-07-20 NOTE — ED Triage Notes (Signed)
C/o Cp , while in triage pt became diaphoretic, pale  and vomited. Pt moved directly to rm 14 , MD into eval pt.

## 2020-07-20 NOTE — Consult Note (Signed)
Cardiology Consultation:   Patient ID: Johnny Ochoa. MRN: [id number]; DOB: [birth year]  Admit date: 07/20/2020 Date of Consult: 07/20/2020  PCP:  Janie Morning, DO   [place] Prov[facility] Cardiologist: Dr. Minna Merritts at [place] here to update MD or APP on Care Team, Refresh:1}     Patient Profile:   Johnny Creger. is a 75 y.o. male with a hx of CAD status post PCI, chronic systolic and diastolic heart failure, bradycardia status post dual-chamber Medtr[employer] defibrillator, morbid obesity, diabetes, hyperlipidemia, gastric paresis who is being seen 07/20/2020 for the evaluation of angina at the request of Dr. Lorin Mercy.  History of Present Illness:   Johnny Ochoa was previously seen at the ED in [facility] 06/2020 with chest pain concerning for unstable angina.  Cardiac enzymes were negative.  He underwent a nuclear stress test that revealed LVEF 36%.  There was evidence of prior infarcts in the inferior and inferolateral regions but no ischemia.  He was discharged but presented to the ED here after an episode of chest pain, shortness of breath, and diaphoresis.  The episode started when he was filling his medical pillbox.  He developed sharp, substernal chest pain.  He took a nitroglycerin and it did not improve.  He then took a second nitroglycerin and his chest pain did improve.  However he developed shortness of breath, lightheadedness, and diaphoresis.  He also had nausea and vomiting.  His wife checked his blood pressure but he is unsure what the result was.  They called EMS and he was brought to the ED.   In the ED he had a CT of the chest abdomen and pelvis that revealed 50 to 75% stenosis of the left renal artery and 50% IMA stenosis.  He also had severe colonic diverticulosis with superimposed inflammatory changes.  He was noted to have extensive multivessel coronary artery calcification.  HS-troponin has been normal x2.  EKG was without acute ischemic changes. He was admitted to the  hospitalist service and cardiology was consulted for the above symptoms.  He is currently chest pain-free.  He reports a recent fall.  He was lifting a hammock in his garage and when he sat down he felt backward.  There was no preceding chest pain or presyncope.  Past Medical History:  Diagnosis Date  . Cancer [facility])    prostate  (Radiation only)  . CHF (congestive heart failure) ([place])   . Diabetes mellitus   . Hypertension   . Kidney stones   . Leg swelling   . Low back pain   . Myocardial infarction ([place]) 12/09/1993  . Obese   . OSA (obstructive sleep apnea)   . Peripheral neuropathy   . PTSD (post-traumatic stress disorder)     Past Surgical History:  Procedure Laterality Date  . CARDIAC DEFIBRILLATOR PLACEMENT    . COLONOSCOPY WITH PROPOFOL N/A 10/26/2017   Procedure: COLONOSCOPY WITH PROPOFOL;  Surgeon: Wonda Horner, MD;  Location: [facility] ENDOSCOPY;  Service: Endoscopy;  Laterality: N/A;  . CORONARY ANGIOPLASTY WITH STENT PLACEMENT  1995    x 3 stents  . JOINT REPLACEMENT  2004   right total knee replacement  . MULTIPLE TOOTH EXTRACTIONS     Total Tooth extraction  . PACEMAKER INSERTION    . PACEMAKER PLACEMENT       Home Medications:  Prior to Admission medications   Medication Sig Start Date End Date Taking? Authorizing Provider  acetaminophen (TYLENOL) 325 MG tablet Take 650 mg  by mouth 3 (three) times daily as needed (pain).    Yes [provider]  albuterol (PROVENTIL HFA;VENTOLIN HFA) 108 (90 BASE) MCG/ACT inhaler Inhale 2 puffs into the lungs every 4 (four) hours as needed for wheezing or shortness of breath. 08/24/13  Yes Pollina, Gwenyth Allegra, MD  ascorbic acid (VITAMIN C) 500 MG tablet Take 500 mg by mouth daily. 11/30/19  Yes [provider]  aspirin EC 81 MG tablet Take 81 mg by mouth daily.   Yes [provider]  atorvastatin (LIPITOR) 80 MG tablet Take 40 mg by mouth at bedtime.    Yes [provider]  Cholecalciferol 50 MCG  (2000 UT) TABS Take 2,000 Units by mouth daily. 11/30/19  Yes [provider]  clopidogrel (PLAVIX) 75 MG tablet Take 75 mg by mouth daily. 02/16/16  Yes [provider]  colchicine 0.6 MG tablet TAKE 1 TABLET BY MOUTH DAILY Patient taking differently: Take 0.6 mg by mouth daily. TAKE 1 TABLET BY MOUTH DAILY 04/27/20  Yes Evelina Bucy, DPM  diclofenac Sodium (VOLTAREN) 1 % GEL Apply 2 g topically every 4 (four) hours as needed (back pain). 11/29/18  Yes [provider]  docusate sodium (COLACE) 100 MG capsule Take 100 mg by mouth 2 (two) times daily.   Yes [provider]  DULoxetine (CYMBALTA) 60 MG capsule Take 60 mg by mouth daily.    Yes [provider]  Ferrous Sulfate (IRON) 325 (65 Fe) MG TABS Take 325 mg by mouth daily.   Yes [provider]  furosemide (LASIX) 40 MG tablet Take 1 tablet (40 mg total) by mouth daily. Take an extra dose if you gain 2 lbs in 24 hours. Patient taking differently: Take 40 mg by mouth See admin instructions. Take one tablet (40 mg) by mouth every morning, take an extra dose if you gain 2 lbs in 24 hours. 02/28/16  Yes Rama, Venetia Maxon, MD  gabapentin (NEURONTIN) 400 MG capsule Take 400 mg by mouth 3 (three) times daily.    Yes [provider]  indomethacin (INDOCIN) 50 MG capsule TAKE 1 CAPSULE(50 MG) BY MOUTH TWICE DAILY WITH A MEAL Patient taking differently: Take 50 mg by mouth 2 (two) times daily with a meal. 08/02/18  Yes Hyatt, Max T, DPM  insulin aspart (NOVOLOG) 100 UNIT/ML FlexPen Inject 18 Units into the skin 2 (two) times daily with a meal.   Yes [provider]  Insulin Glargine (LANTUS) 100 UNIT/ML Solostar Pen Inject 18 Units into the skin 2 (two) times daily.   Yes [provider]  isosorbide mononitrate (IMDUR) 30 MG 24 hr tablet Take 30 mg daily by mouth.   Yes [provider]  Magnesium 400 MG TABS Take 400 mg by mouth daily.   Yes [provider]   metoprolol (TOPROL-XL) 200 MG 24 hr tablet Take 100 mg by mouth daily. Take with or immediately following a meal.   Yes [provider]  miconazole (MICOTIN) 2 % powder Apply 1 application topically daily as needed for itching. 04/03/20  Yes [provider]  mirabegron ER (MYRBETRIQ) 25 MG TB24 tablet Take 25 mg by mouth daily. 04/24/20  Yes [provider]  Multiple Vitamin (MULTIVITAMIN WITH MINERALS) TABS tablet Take 1 tablet by mouth daily.   Yes [provider]  nitroGLYCERIN (NITROSTAT) 0.4 MG SL tablet Place 0.4 mg under the tongue every 5 (five) minutes as needed for chest pain.    Yes [provider]  omeprazole (PRILOSEC) 20 MG capsule Take 40 mg by mouth daily.    Yes [provider]  OZEMPIC, 1 MG/DOSE, 4 MG/3ML SOPN Inject 1 mg as directed once a week. 04/02/20  Yes [provider]  polyethylene glycol (MIRALAX / GLYCOLAX) packet Take 17 g by mouth daily as needed (constipation).    Yes [provider]  potassium chloride (K-DUR,KLOR-CON) 10 MEQ tablet Take 10 mEq by mouth daily.   Yes [provider]  potassium chloride (KLOR-CON) 10 MEQ tablet Take 10 mEq by mouth daily.   Yes [provider]  primidone (MYSOLINE) 50 MG tablet Take 50 mg by mouth 2 (two) times daily.    Yes [provider]  sacubitril-valsartan (ENTRESTO) 49-51 MG Take 1 tablet 2 (two) times daily by mouth.   Yes [provider]  spironolactone (ALDACTONE) 25 MG tablet Take 25 mg by mouth daily. 02/16/16  Yes [provider]  tamsulosin (FLOMAX) 0.4 MG CAPS capsule Take 0.8 mg by mouth at bedtime.    Yes [provider]  terbinafine (LAMISIL) 1 % cream Apply 1 application topically daily as needed for itching.   Yes [provider]  traZODone (DESYREL) 100 MG tablet Take 100 mg by mouth at bedtime.   Yes [provider]  HYDROcodone-acetaminophen (NORCO/VICODIN) 5-325 MG tablet  Take 1 tablet by mouth every 4 (four) hours as needed. Patient not taking: No sig reported 05/29/18   Tegeler, Gwenyth Allegra, MD  HYDROcodone-acetaminophen (NORCO/VICODIN) 5-325 MG tablet Take 1 tablet by mouth every 6 (six) hours as needed for moderate pain. Patient not taking: No sig reported 02/27/19   Fredia Sorrow, MD    Inpatient Medications: Scheduled Meds:  Continuous Infusions:  PRN Meds:   Allergies:    Allergies  Allergen Reactions  . Metformin Diarrhea    Social History:   Social History   Socioeconomic History  . Marital status: Married    Spouse name: Not on file  . Number of children: Not on file  . Years of education: Not on file  . Highest education level: Not on file  Occupational History  . Occupation: retired  Tobacco Use  . Smoking status: Former Smoker    Packs/day: 1.00    Years: 20.00    Pack years: 20.00    Types: Cigarettes    Quit date: 10/23/1980    Years since quitting: 39.7  . Smokeless tobacco: Never Used  Vaping Use  . Vaping Use: Never used  Substance and Sexual Activity  . Alcohol use: No    Alcohol/week: 0.0 standard drinks  . Drug use: No  . Sexual activity: Not Currently  Other Topics Concern  . Not on file  Social History Narrative  . Not on file   Social Determinants of Health   Financial Resource Strain: Not on file  Food Insecurity: Not on file  Transportation Needs: Not on file  Physical Activity: Not on file  Stress: Not on file  Social Connections: Not on file  Intimate Partner Violence: Not on file    Family History:    Family History  Problem Relation Age of Onset  . Heart attack Mother   . Hypertension Mother   . Diabetes Other   . Hypertension Other   . Heart disease Other   . Arthritis Other   . Heart disease Sister   . Heart disease Brother      ROS:  Please see the history of present illness.   All other ROS  reviewed and negative.     Physical Exam/Data:   Vitals:   07/20/20 0700  07/20/20 0730 07/20/20 0845 07/20/20 0858  BP: 138/79 135/77  137/63  Pulse: 72 74  75  Resp: 16 19  20   Temp:    98.5 F (36.9 C)  TempSrc:    Oral  SpO2: 100% 98%  99%  Weight:   118.3 kg   Height:   5' 6.5" (1.689 m)    No intake or output data in the 24 hours ending 07/20/20 1026 Last 3 Weights 07/20/2020 07/20/2020 07/18/2020  Weight (lbs) 260 lb 12.9 oz 262 lb 262 lb  Weight (kg) 118.3 kg 118.842 kg 118.842 kg     VS:  BP 137/63 (BP Location: Left Arm)   Pulse 75   Temp 98.5 F (36.9 C) (Oral)   Resp 20   Ht 5' 6.5" (1.689 m)   Wt 118.3 kg   SpO2 99%   BMI 41.46 kg/m  , BMI Body mass index is 41.46 kg/m. GENERAL:  Well appearing.  No acute distress HEENT: Pupils equal round and reactive, fundi not visualized, oral mucosa unremarkable NECK:  No jugular venous distention, waveform within normal limits, carotid upstroke brisk and symmetric, no bruits LUNGS:  Clear to auscultation bilaterally HEART:  RRR.  PMI not displaced or sustained,S1 and S2 within normal limits, no S3, no S4, no clicks, no rubs, no murmurs ABD:  Flat, positive bowel sounds normal in frequency in pitch, no bruits, no rebound, no guarding, no midline pulsatile mass, no hepatomegaly, no splenomegaly EXT:  2 plus pulses throughout, no edema, no cyanosis no clubbing SKIN:  No rashes no nodules NEURO:  Cranial nerves II through XII grossly intact, motor grossly intact throughout PSYCH:  Cognitively intact, oriented to person place and time   EKG:  The EKG was personally reviewed and demonstrates:  APVS.  Rate 74 bpm. Low voltage.  Telemetry:  Telemetry was personally reviewed and demonstrates:  APVS.  Relevant CV Studies: Carlton Adam Myoview 07/05/20 [facility]) IMPRESSION:  1. Fixed inferior and inferolateral defects consistent with prior  infarcts. No appreciable reversibility to suggest ischemia.   2. Inferoapical dyskinesis. Severe inferior wall hypokinesis.  Moderate apical hypokinesis.   3. Left  ventricular ejection fraction 36%   4. Non invasive risk stratification*: Intermediate    Laboratory Data:  High Sensitivity Troponin:   Recent Labs  Lab 07/20/20 0010 07/20/20 0251  TROPONINIHS 10 11     Chemistry Recent Labs  Lab 07/20/20 0010  NA 138  K 3.5  CL 105  CO2 25  GLUCOSE 118*  BUN 14  CREATININE 0.97  CALCIUM 8.5*  GFRNONAA >60  ANIONGAP 8    Recent Labs  Lab 07/20/20 0030  PROT 6.9  ALBUMIN 3.5  AST 30  ALT 27  ALKPHOS 71  BILITOT 0.3   Hematology Recent Labs  Lab 07/20/20 0010  WBC 3.8*  RBC 3.87*  HGB 12.5*  HCT 37.8*  MCV 97.7  MCH 32.3  MCHC 33.1  RDW 14.4  PLT 174   BNPNo results for input(s): BNP, PROBNP in the last 168 hours.  DDimer No results for input(s): DDIMER in the last 168 hours.   Radiology/Studies:  DG Chest 1 View  Result Date: 07/20/2020 CLINICAL DATA:  Diaphoresis, pallor, chest pain EXAM: CHEST  1 VIEW COMPARISON:  None. FINDINGS: Lung volumes are small, but are symmetric and are clear. No pneumothorax or pleural effusion. Cardiac size is within normal limits when accounting  for poor pulmonary insufflation. Left subclavian pacemaker defibrillator is unchanged. Pulmonary vascularity is normal. No acute bone abnormality. IMPRESSION: No active disease. Electronically Signed   By: Fidela Salisbury MD   On: 07/20/2020 00:27   CT Head Wo Contrast  Result Date: 07/20/2020 CLINICAL DATA:  Altered mental status EXAM: CT HEAD WITHOUT CONTRAST TECHNIQUE: Contiguous axial images were obtained from the base of the skull through the vertex without intravenous contrast. COMPARISON:  None. FINDINGS: Brain: Normal anatomic configuration. Parenchymal volume loss is commensurate with the patient's age. No abnormal intra or extra-axial mass lesion or fluid collection. No abnormal mass effect or midline shift. No evidence of acute intracranial hemorrhage or infarct. Ventricular size is normal. Cerebellum unremarkable. Vascular: No asymmetric  hyperdense vasculature at the skull base. Skull: Intact Sinuses/Orbits: Paranasal sinuses are clear. Orbits are unremarkable. Other: Mastoid air cells and middle ear cavities are clear. IMPRESSION: No acute intracranial abnormality. Electronically Signed   By: Fidela Salisbury MD   On: 07/20/2020 02:01   CT Head Wo Contrast  Result Date: 07/18/2020 CLINICAL DATA:  Pain following fall EXAM: CT HEAD WITHOUT CONTRAST TECHNIQUE: Contiguous axial images were obtained from the base of the skull through the vertex without intravenous contrast. COMPARISON:  November 30, 2013 FINDINGS: Brain: There is mild diffuse atrophy. There is no intracranial mass, hemorrhage, extra-axial fluid collection, or midline shift. There is slight decreased attenuation in portions of the the centra semiovale bilaterally. Elsewhere brain parenchyma appears unremarkable. No appreciable acute infarct. Vascular: No hyperdense vessel. Calcification noted in each carotid siphon region. Skull: Bony calvarium appears intact. Sinuses/Orbits: There is mucosal thickening in several ethmoid air cells. Other visualized paranasal sinuses are clear. Orbits appear symmetric bilaterally. Other: Mastoid air cells are clear. IMPRESSION: Mild atrophy with mild periventricular small vessel disease. No acute infarct. No mass or hemorrhage. There are foci of arterial vascular calcification. There is mucosal thickening in several ethmoid air cells. Electronically Signed   By: Lowella Grip III M.D.   On: 07/18/2020 13:40   CT Angio Chest/Abd/Pel for Dissection W and/or Wo Contrast  Result Date: 07/20/2020 CLINICAL DATA:  Chest pain, diaphoresis, pallor, vomiting, altered mental status EXAM: CT ANGIOGRAPHY CHEST, ABDOMEN AND PELVIS TECHNIQUE: Non-contrast CT of the chest was initially obtained. Multidetector CT imaging through the chest, abdomen and pelvis was performed using the standard protocol during bolus administration of intravenous contrast. Multiplanar  reconstructed images and MIPs were obtained and reviewed to evaluate the vascular anatomy. CONTRAST:  171mL OMNIPAQUE IOHEXOL 350 MG/ML SOLN COMPARISON:  10/22/2017, 05/02/2014 FINDINGS: CTA CHEST FINDINGS Cardiovascular: There is extensive multi-vessel coronary artery calcification. Mild global cardiomegaly with mild left ventricular dilation. No pericardial effusion. The central pulmonary arteries are of normal caliber. The thoracic aorta is of normal caliber. No significant atherosclerotic plaque within the thoracic aorta. No evidence of intramural hematoma, aneurysm, or dissection. The arch vasculature demonstrates a normal anatomic configuration and is widely patent proximally. Left subclavian pacemaker is seen with leads within the right atrium and right ventricle. Mediastinum/Nodes: The visualized thyroid is unremarkable. No pathologic thoracic adenopathy. The esophagus is unremarkable. Lungs/Pleura: Lungs are clear. No pleural effusion or pneumothorax. Musculoskeletal: The osseous structures are age-appropriate. No acute bone abnormality. No lytic or blastic bone lesion. Review of the MIP images confirms the above findings. CTA ABDOMEN AND PELVIS FINDINGS VASCULAR Aorta: Normal caliber. No aneurysm or dissection. Moderate atherosclerotic calcification. No periaortic inflammatory change. Celiac: Widely patent. Variant anatomy with the celiac axis giving rise only to the left  gastric artery and splenic artery. No aneurysm or dissection. SMA: Mild atherosclerotic calcification proximally without evidence of hemodynamically significant stenosis. Variant anatomy with replacement of the a common hepatic artery to the superior mesenteric artery. No aneurysm or dissection. Renals: 50-75% stenosis of the left renal artery at its origin. Right renal artery is widely patent. Normal vascular morphology. No aneurysm. IMA: Roughly 50% stenosis at its origin. Distally widely patent without aneurysm or dissection. Inflow:  Widely patent. Mild atherosclerotic calcification. No aneurysm or dissection. Internal iliac arteries are patent bilaterally. Veins: No obvious venous abnormality within the limitations of this arterial phase study. Review of the MIP images confirms the above findings. NON-VASCULAR Hepatobiliary: No focal liver abnormality is seen. No gallstones, gallbladder wall thickening, or biliary dilatation. Pancreas: Unremarkable Spleen: Unremarkable Adrenals/Urinary Tract: Nodular thickening of the adrenal glands is unchanged. The kidneys are unremarkable. The bladder is unremarkable. Stomach/Bowel: Severe descending and sigmoid colonic diverticulosis without superimposed inflammatory change. The stomach, small bowel, and large bowel are otherwise unremarkable. Appendix normal. No free intraperitoneal gas or fluid. Lymphatic: No pathologic adenopathy. Reproductive: Prostate is unremarkable. Other: Subcutaneous infiltration within the a anterior abdominal wall bilaterally likely relates to subcutaneous injection. Tiny fat containing umbilical hernia. Rectum unremarkable. Musculoskeletal: No acute bone abnormality. No lytic or blastic bone lesion. Degenerative changes are seen within the lumbar spine. Sclerotic changes involving the subchondral bone of the right sacroiliac joint likely relates to degenerative change. Review of the MIP images confirms the above findings. IMPRESSION: Extensive multi-vessel coronary artery calcification. Mild global cardiomegaly with left ventricular dilation. Normal examination of the a thoracic aorta. Moderate atherosclerotic plaque of the abdominal aorta without aneurysm or dissection noted. 50-75% stenosis of the left renal artery origin. 50% stenosis of the inferior mesenteric artery at its origin. Given wide patency of the celiac axis and superior mesenteric artery, this is unlikely to be clinically significant. Severe distal colonic diverticulosis without superimposed inflammatory change.  Aortic Atherosclerosis (ICD10-I70.0). Electronically Signed   By: Fidela Salisbury MD   On: 07/20/2020 02:16     Assessment and Plan:   # Unstable angina:  Johnny Ochoa has not presented to the emergency department twice in 1 month with chest pain.  It is unclear to me whether the diaphoresis nausea and vomiting that he had were related to hypotension after taking the nitroglycerin or if they were related to angina.  He is unsure what his blood pressure was after taking the medication.  Cardiac enzymes have been negative.  He had a stress test that was consistent with prior infarct but no ischemia last month.  He does have three-vessel coronary artery disease on cath and has previously had PCI.  At this point I think it is best to get a left heart cath on Monday.  He is in agreement with this plan.  Continue aspirin, clopidogrel, metoprolol, Imdur, and atorvastatin.  Risks and benefits of cardiac catheterization have been discussed with the patient.  The patient understands that risks included but are not limited to stroke (1 in 1000), death (1 in 67), kidney failure [usually temporary] (1 in 500), bleeding (1 in 200), allergic reaction [possibly serious] (1 in 200). The patient understands and agrees to proceed.   #Chronic systolic and diastolic heart failure: He appears euvolemic on exam though he does report orthopnea and edema.  His lungs sound clear and I do not see any lower extremity edema.  We will check a BNP.  Continue metoprolol succinate, Imdur, Entresto, and spironolactone.  We  will add an SGLT2 inhibitor prior to discharge.  #Status post dual-chamber defibrillator: Implanted in 2018 for primary prevention for redo systolic function.  He has had 1 episode of NSVT per his outpatient cardiology notes.  Currently atrial pacing.  #NSVT: No arrhythmias noted on telemetry.  Continue metoprolol.  #OSA: Continue CPAP.    Risk Assessment/Risk Scores:     TIMI Risk Score for Unstable Angina  or Non-ST Elevation MI:   The patient's TIMI risk score is 4, which indicates a 20% risk of all cause mortality, new or recurrent myocardial infarction or need for urgent revascularization in the next 14 days.  New York Heart Association (NYHA) Functional Class NYHA Class III        For questions or updates, please contact [facility] Please consult [email or web address] for contact info under    Signed, Skeet Latch, MD  07/20/2020 10:26 AM

## 2020-07-20 NOTE — ED Notes (Signed)
Report given to Legrand Como with [facility], eta 20 min.  Pt sleeping at this time, stable for transfer.

## 2020-07-20 NOTE — H&P (Signed)
History and Physical    Johnny Ochoa. JJH:[id number] DOB: [birth year] DOA: 07/20/2020  PCP: Janie Morning, DO Consultants:  [place]; Phillis Haggis - cardiology; Izaj - GI Patient coming from:  Home - lives with wife and granddaughter (college); NOK: Wife, [phone]; [phone]  Chief Complaint: Chest pain  HPI: Johnny Ochoa. is a 75 y.o. male with medical history significant of PTSD; morbid obesity; OSA on CPAP; CAD; DM; HTN; prostate CA; and chronic systolic CHF s/p AICD placement presenting with chest pain.  He reports that he was sorting out his medications when he developed acute onset of chest tightness, substernal.  He has associated SOB and diaphoresis.  He took NTG and had relief but it recurred "with a vengeance."  He developed n/v and his wife called 911.  He tried to walk and was staggering, had blurry vision.  His CP is resolved at this time.   He fell Thursday and was seen at [facility] and given a tetanus shot.  He had a stress test at [facility] recently, was having CP and went for stress test while hospitalized at [facility].  Review of records shows hospitalization at [facility] from 5/19-20 for chest pain.  Nuclear stress with evidence of prior infarcts but no acute ischemia.  EF estimated to be 36%.    ED Course: [facility] to [facility] transfer, per Dr. Cyd Silence:  Chest pain with typical features. On arrival, patient was notably diaphoretic and began vomiting in addition to his ongoing chest discomfort. Patient's symptoms have since spontaneously resolved. Troponins unremarkable however considering patient's extensive medical history and concerning clinical presentation on arrival ER provider is requesting overnight observation for continued monitoring on telemetry, cycling cardiac enzymes.  Of note, patient did have a nuclear stress test on 07/05/2020 that was negative at [facility].   Review of Systems: As per HPI; otherwise review of systems reviewed and negative.   Ambulatory Status:   Ambulates with a cane  COVID Vaccine Status:    Complete x 2 boosters  Past Medical History:  Diagnosis Date  . Cancer [facility])    prostate  (Radiation only)  . CHF (congestive heart failure) ([place])   . Diabetes mellitus   . Hypertension   . Kidney stones   . Leg swelling   . Low back pain   . Myocardial infarction ([place]) 12/09/1993  . Obese   . OSA (obstructive sleep apnea)   . Peripheral neuropathy   . PTSD (post-traumatic stress disorder)     Past Surgical History:  Procedure Laterality Date  . CARDIAC DEFIBRILLATOR PLACEMENT    . COLONOSCOPY WITH PROPOFOL N/A 10/26/2017   Procedure: COLONOSCOPY WITH PROPOFOL;  Surgeon: Wonda Horner, MD;  Location: [facility] ENDOSCOPY;  Service: Endoscopy;  Laterality: N/A;  . CORONARY ANGIOPLASTY WITH STENT PLACEMENT  1995    x 3 stents  . JOINT REPLACEMENT  2004   right total knee replacement  . MULTIPLE TOOTH EXTRACTIONS     Total Tooth extraction  . PACEMAKER INSERTION    . PACEMAKER PLACEMENT      Social History   Socioeconomic History  . Marital status: Married    Spouse name: Not on file  . Number of children: Not on file  . Years of education: Not on file  . Highest education level: Not on file  Occupational History  . Occupation: retired  Tobacco Use  . Smoking status: Former Smoker    Packs/day: 1.00    Years: 20.00    Pack years: 20.00  Types: Cigarettes    Quit date: 10/23/1980    Years since quitting: 39.7  . Smokeless tobacco: Never Used  Vaping Use  . Vaping Use: Never used  Substance and Sexual Activity  . Alcohol use: No    Alcohol/week: 0.0 standard drinks  . Drug use: No  . Sexual activity: Not Currently  Other Topics Concern  . Not on file  Social History Narrative  . Not on file   Social Determinants of Health   Financial Resource Strain: Not on file  Food Insecurity: Not on file  Transportation Needs: Not on file  Physical Activity: Not on file  Stress: Not on file  Social Connections: Not on  file  Intimate Partner Violence: Not on file    Allergies  Allergen Reactions  . Metformin Diarrhea    Family History  Problem Relation Age of Onset  . Heart attack Mother   . Hypertension Mother   . Diabetes Other   . Hypertension Other   . Heart disease Other   . Arthritis Other   . Heart disease Sister   . Heart disease Brother     Prior to Admission medications   Medication Sig Start Date End Date Taking? Authorizing Provider  acetaminophen (TYLENOL) 325 MG tablet Take 650 mg by mouth 3 (three) times daily as needed (pain).     [provider]  albuterol (PROVENTIL HFA;VENTOLIN HFA) 108 (90 BASE) MCG/ACT inhaler Inhale 2 puffs into the lungs every 4 (four) hours as needed for wheezing or shortness of breath. 08/24/13   Orpah Greek, MD  aspirin EC 81 MG tablet Take 81 mg by mouth daily.    [provider]  atorvastatin (LIPITOR) 80 MG tablet Take 40 mg by mouth at bedtime.     [provider]  clopidogrel (PLAVIX) 75 MG tablet Take 75 mg by mouth daily. 02/16/16   [provider]  colchicine 0.6 MG tablet TAKE 1 TABLET BY MOUTH DAILY 04/27/20   Evelina Bucy, DPM  docusate sodium (COLACE) 100 MG capsule Take 100 mg by mouth 2 (two) times daily.    [provider]  DULoxetine (CYMBALTA) 60 MG capsule Take 60 mg by mouth daily.     [provider]  furosemide (LASIX) 40 MG tablet Take 1 tablet (40 mg total) by mouth daily. Take an extra dose if you gain 2 lbs in 24 hours. Patient taking differently: Take 40 mg by mouth See admin instructions. Take one tablet (40 mg) by mouth every morning, take an extra dose if you gain 2 lbs in 24 hours. 02/28/16   Rama, Venetia Maxon, MD  gabapentin (NEURONTIN) 400 MG capsule Take 400 mg by mouth 3 (three) times daily.     [provider]  HYDROcodone-acetaminophen (NORCO/VICODIN) 5-325 MG tablet Take 1 tablet by mouth every 4 (four) hours as needed. 05/29/18   Tegeler,  Gwenyth Allegra, MD  HYDROcodone-acetaminophen (NORCO/VICODIN) 5-325 MG tablet Take 1 tablet by mouth every 6 (six) hours as needed for moderate pain. 02/27/19   Fredia Sorrow, MD  indomethacin (INDOCIN) 50 MG capsule TAKE 1 CAPSULE(50 MG) BY MOUTH TWICE DAILY WITH A MEAL 08/02/18   Hyatt, Max T, DPM  insulin aspart (NOVOLOG) 100 UNIT/ML FlexPen Inject 25 Units into the skin 2 (two) times daily with a meal.     [provider]  Insulin Glargine (LANTUS) 100 UNIT/ML Solostar Pen Inject 48-50 Units into the skin See admin instructions. Inject 50 units subcutaneously every morning and  48 units at night    [provider]  isosorbide mononitrate (IMDUR) 30 MG 24 hr tablet Take 30 mg daily by mouth.    [provider]  lurasidone (LATUDA) 40 MG TABS tablet Take 40 mg by mouth daily with breakfast.     [provider]  Magnesium 400 MG TABS Take 400 mg by mouth daily.    [provider]  metoprolol (TOPROL-XL) 200 MG 24 hr tablet Take 100 mg by mouth daily. Take with or immediately following a meal.     [provider]  Multiple Vitamin (MULTIVITAMIN WITH MINERALS) TABS tablet Take 1 tablet by mouth daily.    [provider]  nitroGLYCERIN (NITROSTAT) 0.4 MG SL tablet Place 0.4 mg under the tongue every 5 (five) minutes as needed for chest pain.     [provider]  omeprazole (PRILOSEC) 20 MG capsule Take 40 mg by mouth daily.     [provider]  oxybutynin (DITROPAN-XL) 10 MG 24 hr tablet Take by mouth.    [provider]  oxyCODONE-acetaminophen (PERCOCET) 7.5-325 MG tablet Take 1 tablet by mouth every 6 (six) hours as needed (pain).    [provider]  polyethylene glycol (MIRALAX / GLYCOLAX) packet Take 17 g by mouth daily as needed (constipation).     [provider]  potassium chloride (K-DUR,KLOR-CON) 10 MEQ tablet Take 10 mEq by mouth daily.    [provider]  primidone (MYSOLINE)  50 MG tablet Take 50 mg by mouth 2 (two) times daily.     [provider]  sacubitril-valsartan (ENTRESTO) 49-51 MG Take 1 tablet 2 (two) times daily by mouth.    [provider]  spironolactone (ALDACTONE) 25 MG tablet Take 25 mg by mouth daily. 02/16/16   [provider]  tamsulosin (FLOMAX) 0.4 MG CAPS capsule Take 0.8 mg by mouth at bedtime.     [provider]  terbinafine (LAMISIL) 1 % cream Apply 1 application topically daily as needed for itching.    [provider]  traZODone (DESYREL) 100 MG tablet Take 100 mg by mouth at bedtime.    [provider]    Physical Exam: Vitals:   07/20/20 0700 07/20/20 0730 07/20/20 0845 07/20/20 0858  BP: 138/79 135/77  137/63  Pulse: 72 74  75  Resp: 16 19  20   Temp:    98.5 F (36.9 C)  TempSrc:    Oral  SpO2: 100% 98%  99%  Weight:   118.3 kg   Height:   5' 6.5" (1.689 m)      . General:  Appears calm and comfortable and is in NAD . Eyes:  PERRL, EOMI, normal lids, iris . ENT:  grossly normal hearing, lips & tongue, mmm; edentulous . Neck:  no LAD, masses or thyromegaly . Cardiovascular:  RRR, no m/r/g. No LE edema. CP is not reproducible . Respiratory:   CTA bilaterally with no wheezes/rales/rhonchi.  Normal respiratory effort. . Abdomen:  soft, NT, ND . Skin:  no rash or induration seen on limited exam . Musculoskeletal:  grossly normal tone BUE/BLE, good ROM, no bony abnormality . Psychiatric:  grossly normal mood and affect, speech fluent and appropriate, AOx3 . Neurologic:  CN 2-12 grossly intact, moves all extremities in coordinated fashion    Radiological Exams on Admission: Independently reviewed - see discussion in A/P where applicable  DG Chest 1 View  Result Date: 07/20/2020 CLINICAL DATA:  Diaphoresis, pallor, chest pain EXAM: CHEST  1 VIEW COMPARISON:  None. FINDINGS: Lung volumes are small, but are symmetric and are clear. No pneumothorax or pleural effusion.  Cardiac size is within normal limits when accounting for poor pulmonary insufflation. Left subclavian pacemaker defibrillator is unchanged. Pulmonary vascularity is normal. No acute bone abnormality. IMPRESSION: No active disease. Electronically Signed   By: Fidela Salisbury MD   On: 07/20/2020 00:27   CT Head Wo Contrast  Result Date: 07/20/2020 CLINICAL DATA:  Altered mental status EXAM: CT HEAD WITHOUT CONTRAST TECHNIQUE: Contiguous axial images were obtained from the base of the skull through the vertex without intravenous contrast. COMPARISON:  None. FINDINGS: Brain: Normal anatomic configuration. Parenchymal volume loss is commensurate with the patient's age. No abnormal intra or extra-axial mass lesion or fluid collection. No abnormal mass effect or midline shift. No evidence of acute intracranial hemorrhage or infarct. Ventricular size is normal. Cerebellum unremarkable. Vascular: No asymmetric hyperdense vasculature at the skull base. Skull: Intact Sinuses/Orbits: Paranasal sinuses are clear. Orbits are unremarkable. Other: Mastoid air cells and middle ear cavities are clear. IMPRESSION: No acute intracranial abnormality. Electronically Signed   By: Fidela Salisbury MD   On: 07/20/2020 02:01   CT Head Wo Contrast  Result Date: 07/18/2020 CLINICAL DATA:  Pain following fall EXAM: CT HEAD WITHOUT CONTRAST TECHNIQUE: Contiguous axial images were obtained from the base of the skull through the vertex without intravenous contrast. COMPARISON:  November 30, 2013 FINDINGS: Brain: There is mild diffuse atrophy. There is no intracranial mass, hemorrhage, extra-axial fluid collection, or midline shift. There is slight decreased attenuation in portions of the the centra semiovale bilaterally. Elsewhere brain parenchyma appears unremarkable. No appreciable acute infarct. Vascular: No hyperdense vessel. Calcification noted in each carotid siphon region. Skull: Bony calvarium appears intact. Sinuses/Orbits: There is  mucosal thickening in several ethmoid air cells. Other visualized paranasal sinuses are clear. Orbits appear symmetric bilaterally. Other: Mastoid air cells are clear. IMPRESSION: Mild atrophy with mild periventricular small vessel disease. No acute infarct. No mass or hemorrhage. There are foci of arterial vascular calcification. There is mucosal thickening in several ethmoid air cells. Electronically Signed   By: Lowella Grip III M.D.   On: 07/18/2020 13:40   CT Angio Chest/Abd/Pel for Dissection W and/or Wo Contrast  Result Date: 07/20/2020 CLINICAL DATA:  Chest pain, diaphoresis, pallor, vomiting, altered mental status EXAM: CT ANGIOGRAPHY CHEST, ABDOMEN AND PELVIS TECHNIQUE: Non-contrast CT of the chest was initially obtained. Multidetector CT imaging through the chest, abdomen and pelvis was performed using the standard protocol during bolus administration of intravenous contrast. Multiplanar reconstructed images and MIPs were obtained and reviewed to evaluate the vascular anatomy. CONTRAST:  187mL OMNIPAQUE IOHEXOL 350 MG/ML SOLN COMPARISON:  10/22/2017, 05/02/2014 FINDINGS: CTA CHEST FINDINGS Cardiovascular: There is extensive multi-vessel coronary artery calcification. Mild global cardiomegaly with mild left ventricular dilation. No pericardial effusion. The central pulmonary arteries are of normal caliber. The thoracic aorta is of normal caliber. No significant atherosclerotic plaque within the thoracic aorta. No evidence of intramural hematoma, aneurysm, or dissection. The arch vasculature demonstrates a normal anatomic configuration and is widely patent proximally. Left subclavian pacemaker is seen with leads within the right atrium and right ventricle. Mediastinum/Nodes: The visualized thyroid is unremarkable. No pathologic thoracic adenopathy. The esophagus is unremarkable. Lungs/Pleura: Lungs are clear. No pleural effusion or pneumothorax. Musculoskeletal: The osseous structures are  age-appropriate. No acute bone abnormality. No lytic or blastic bone lesion. Review of the MIP images confirms the above findings. CTA ABDOMEN AND PELVIS FINDINGS  VASCULAR Aorta: Normal caliber. No aneurysm or dissection. Moderate atherosclerotic calcification. No periaortic inflammatory change. Celiac: Widely patent. Variant anatomy with the celiac axis giving rise only to the left gastric artery and splenic artery. No aneurysm or dissection. SMA: Mild atherosclerotic calcification proximally without evidence of hemodynamically significant stenosis. Variant anatomy with replacement of the a common hepatic artery to the superior mesenteric artery. No aneurysm or dissection. Renals: 50-75% stenosis of the left renal artery at its origin. Right renal artery is widely patent. Normal vascular morphology. No aneurysm. IMA: Roughly 50% stenosis at its origin. Distally widely patent without aneurysm or dissection. Inflow: Widely patent. Mild atherosclerotic calcification. No aneurysm or dissection. Internal iliac arteries are patent bilaterally. Veins: No obvious venous abnormality within the limitations of this arterial phase study. Review of the MIP images confirms the above findings. NON-VASCULAR Hepatobiliary: No focal liver abnormality is seen. No gallstones, gallbladder wall thickening, or biliary dilatation. Pancreas: Unremarkable Spleen: Unremarkable Adrenals/Urinary Tract: Nodular thickening of the adrenal glands is unchanged. The kidneys are unremarkable. The bladder is unremarkable. Stomach/Bowel: Severe descending and sigmoid colonic diverticulosis without superimposed inflammatory change. The stomach, small bowel, and large bowel are otherwise unremarkable. Appendix normal. No free intraperitoneal gas or fluid. Lymphatic: No pathologic adenopathy. Reproductive: Prostate is unremarkable. Other: Subcutaneous infiltration within the a anterior abdominal wall bilaterally likely relates to subcutaneous injection.  Tiny fat containing umbilical hernia. Rectum unremarkable. Musculoskeletal: No acute bone abnormality. No lytic or blastic bone lesion. Degenerative changes are seen within the lumbar spine. Sclerotic changes involving the subchondral bone of the right sacroiliac joint likely relates to degenerative change. Review of the MIP images confirms the above findings. IMPRESSION: Extensive multi-vessel coronary artery calcification. Mild global cardiomegaly with left ventricular dilation. Normal examination of the a thoracic aorta. Moderate atherosclerotic plaque of the abdominal aorta without aneurysm or dissection noted. 50-75% stenosis of the left renal artery origin. 50% stenosis of the inferior mesenteric artery at its origin. Given wide patency of the celiac axis and superior mesenteric artery, this is unlikely to be clinically significant. Severe distal colonic diverticulosis without superimposed inflammatory change. Aortic Atherosclerosis (ICD10-I70.0). Electronically Signed   By: Fidela Salisbury MD   On: 07/20/2020 02:16    EKG: Independently reviewed.  Atrial paced rhythm with rate 74; nonspecific ST changes with no evidence of acute ischemia   Labs on Admission: I have personally reviewed the available labs and imaging studies at the time of the admission.  Pertinent labs:   Glucose 118 HS troponin 10, 11 WBC 3.8 Hgb 12.5 COVID/flu negative   Assessment/Plan Principal Problem:   Unstable angina (HCC) Active Problems:   Essential hypertension   CAD in native artery   Hyperlipidemia   Morbid obesity (HCC)   Depression with anxiety   Cardiac pacemaker in situ   OSA on CPAP   Chronic combined systolic and diastolic heart failure (HCC)   Unstable angina -Patient with prior h/o CAD with stents presenting with acute onset of substernal chest pain that came on at rest and was improved with NTG. -2/3 features suggestive of atypical angina.   -He was hospitalized with similar presentation  recently at [facility] and had negative nuclear stress test. -CXR unremarkable.   -Initial HS troponin negative; repeat with negative delta.   -EKG with no apparent STEMI. -CTA with extensive multi-vessel coronary artery calcification. -TIMI risk score is 5; which predicts a 14 days risk of death, recurrent MI, or urgent revascularization of 26%.  -Will admit since the patient has  recurrent unstable anginal symptoms with recent negative stress test and so will need cardiac catheterization for definitive diagnosis; this is scheduled for Monday (6/6).   -Repeat EKG in AM -Risk factor stratification with HgbA1c and FLP; will also check TSH  -Cardiology consultation requested -Continue ASA 81 mg daily, Plavix, Imdur -NTG for symptom relief (although there is no mortality benefit) -Needs beta blocker (PO, but only if not in HF or at risk for shock) -Will add CCB if ischemia persists despite max BB therapy (or there is a contraindication to BB) -Will plan to start Heparin drip if enzymes are positive and/or chest pain recurs -morphine given -Supplemental O2 -Start Lipitor 40 mg qhs for now  HTN -Continue Toprol XL -Will also add prn hydralazine  HLD -Continue Lipitor -Check lipids  DM -Check A1c -Continue Lantus -Will cover with moderate-scale SSI for now -Continue Neurontin  Chronic combined CHF -No current evidence of decompensation -06/02/19 echo with EF 35-40% -Pacer/AICD in place -Continue Lasix, Aldactone, Entresto -Plan to add SGLT2 inhibitor at time of d/c  Gout -Continue daily colchicine -Hold BID Indocin; ideally, NSAIDs should be avoided in this patient  Mood d/o -Continue Cymbalta  OSA  -Continue CPAP  Obesity -Body mass index is 41.46 kg/m..  -Weight loss should be encouraged -Continue Ozempic -Outpatient PCP/bariatric medicine f/u encouraged   DVT prophylaxis: Lovenox Code Status:  Full - confirmed with patient Family Communication: None  present Disposition Plan:  The patient is from: home  Anticipated d/c is to: home without [facility] services once his cardiology issues have been resolved.  Anticipated d/c date will depend on clinical response to treatment, sometime after cardiac cath on 6/6  Patient is currently: acutely ill Consults called: Cardiology; nutrition  Admission status: Admit - It is my clinical opinion that admission to [place] is reasonable and necessary because of the expectation that this patient will require hospital care that crosses at least 2 midnights to treat this condition based on the medical complexity of the problems presented.  Given the aforementioned information, the predictability of an adverse outcome is felt to be significant.    Karmen Bongo MD [facility]   How to contact the [facility] Attending or Consulting provider [place] or covering provider during after hours [place], for this patient?  1. Check the care team in [facility] and look for a) attending/consulting [facility] provider listed and b) the [facility] team listed 2. Log into [email or web address] and use [place]'s universal password to access. If you do not have the password, please contact the hospital operator. 3. Locate the [facility] provider you are looking for under [facility] and page to a number that you can be directly reached. 4. If you still have difficulty reaching the provider, please page the [facility] (Director on Call) for the Hospitalists listed on amion for assistance.   07/20/2020, 1:29 PM

## 2020-07-20 NOTE — ED Notes (Signed)
[facility] at bedside to transfer pt. Pt up to BR prior to tranfser.  Pt stable.

## 2020-07-20 NOTE — ED Notes (Signed)
Report given to Lorene Dy at [facility]

## 2020-07-21 DIAGNOSIS — Z950 Presence of cardiac pacemaker: Secondary | ICD-10-CM

## 2020-07-21 LAB — GLUCOSE, CAPILLARY
Glucose-Capillary: 106 mg/dL — ABNORMAL HIGH (ref 70–99)
Glucose-Capillary: 128 mg/dL — ABNORMAL HIGH (ref 70–99)
Glucose-Capillary: 146 mg/dL — ABNORMAL HIGH (ref 70–99)
Glucose-Capillary: 106 mg/dL — ABNORMAL HIGH (ref 70–99)

## 2020-07-21 LAB — CBC
HCT: 35.2 % — ABNORMAL LOW (ref 39.0–52.0)
Hemoglobin: 11.3 g/dL — ABNORMAL LOW (ref 13.0–17.0)
MCH: 31.8 pg (ref 26.0–34.0)
MCHC: 32.1 g/dL (ref 30.0–36.0)
MCV: 99.2 fL (ref 80.0–100.0)
Platelets: 162 10*3/uL (ref 150–400)
RBC: 3.55 MIL/uL — ABNORMAL LOW (ref 4.22–5.81)
RDW: 14.4 % (ref 11.5–15.5)
WBC: 4 10*3/uL (ref 4.0–10.5)
nRBC: 0 % (ref 0.0–0.2)

## 2020-07-21 LAB — BASIC METABOLIC PANEL
Anion gap: 8 (ref 5–15)
BUN: 8 mg/dL (ref 8–23)
CO2: 24 mmol/L (ref 22–32)
Calcium: 8.6 mg/dL — ABNORMAL LOW (ref 8.9–10.3)
Chloride: 105 mmol/L (ref 98–111)
Creatinine, Ser: 0.94 mg/dL (ref 0.61–1.24)
GFR, Estimated: >60 mL/min (ref >60)
Glucose, Bld: 120 mg/dL — ABNORMAL HIGH (ref 70–99)
Potassium: 4.4 mmol/L (ref 3.5–5.1)
Sodium: 137 mmol/L (ref 135–145)

## 2020-07-21 LAB — PROTIME-INR
INR: 1.1 (ref 0.8–1.2)
Prothrombin Time: 13.9 seconds (ref 11.4–15.2)

## 2020-07-21 NOTE — Progress Notes (Signed)
PROGRESS NOTE    Johnny Ochoa.  ERD:[id number] DOB: [birth year] DOA: 07/20/2020 PCP: Janie Morning, DO     Brief Narrative:  Johnny Odem. is a 75 y.o. male with medical history significant of PTSD; morbid obesity; OSA on CPAP; CAD; DM; HTN; prostate CA; and chronic systolic CHF s/p AICD placement presenting with chest pain.  He reports that he was sorting out his medications when he developed acute onset of chest tightness, substernal.  He has associated SOB and diaphoresis.  He took NTG and had relief but it recurred "with a vengeance."  He developed n/v and his wife called 911.  He tried to walk and was staggering, had blurry vision. Review of records shows hospitalization at [facility] for chest pain.  Nuclear stress with evidence of prior infarcts but no acute ischemia.  EF estimated to be 36%.  Patient was admitted for chest pain work-up, cardiology consulted.  New events last 24 hours / Subjective: Patient denies any chest pain this morning, feeling well overall.  States that he always has a little bit of shortness of breath, which is his baseline.  Assessment & Plan:   Principal Problem:   Unstable angina (HCC) Active Problems:   Essential hypertension   CAD in native artery   Hyperlipidemia   Morbid obesity ([place])   Depression with anxiety   Cardiac pacemaker in situ   OSA on CPAP   Chronic combined systolic and diastolic heart failure ([place])   Unstable angina -Recently had a negative nuclear stress test at [facility] -Troponin negative -Cardiology following, planning for heart cath tomorrow 6/6 -Continue aspirin, Plavix, Imdur, Lipitor, Toprol  Chronic combined systolic and diastolic heart failure -BNP 81.1 -Continue Lasix, Aldactone, Entresto.  Planning to add SGLT2 inhibitor at time of discharge  Hypertension -Continue Toprol  Hyperlipidemia -Continue Lipitor  Diabetes mellitus -Continue Lantus, sliding scale insulin  Mood  disorder -Continue Cymbalta  OSA -Continue CPAP nightly  Morbid obesity -Estimated body mass index is 41.46 kg/m as calculated from the following:   Height as of this encounter: 5' 6.5" (1.689 m).   Weight as of this encounter: 118.3 kg.    DVT prophylaxis:  enoxaparin (LOVENOX) injection 40 mg Start: 07/20/20 1200  Code Status:     Code Status Orders  (From admission, onward)         Start     Ordered   07/20/20 1105  Full code  Continuous        07/20/20 1105        Code Status History    Date Active Date Inactive Code Status Order ID Comments User Context   10/22/2017 1134 10/26/2017 1747 Full Code [id number]  Bonnell Public, MD Inpatient   02/25/2016 2206 02/28/2016 1705 Full Code [id number]  Etta Quill, DO Inpatient   06/19/2014 2319 06/23/2014 1445 Full Code [id number]  Jani Gravel, MD Inpatient   Advance Care Planning Activity     Family Communication: None at bedside Disposition Plan:  Status is: Inpatient  Remains inpatient appropriate because:Ongoing diagnostic testing needed not appropriate for outpatient work up   Dispo: The patient is from: Home              Anticipated d/c is to: Home              Patient currently is not medically stable to d/c.  Heart cath planned 6/6   Difficult to place patient No  Consultants:   Cardiology  Procedures:   None  Antimicrobials:  Anti-infectives (From admission, onward)   None        Objective: Vitals:   07/20/20 1940 07/21/20 0034 07/21/20 0535 07/21/20 0909  BP: 121/63 104/62 107/66 (!) 112/58  Pulse: 74 70 70 75  Resp: 20 20 18 20   Temp: 98.4 F (36.9 C) 98.6 F (37 C) (!) 97.4 F (36.3 C) 98.7 F (37.1 C)  TempSrc: Oral Oral Oral Oral  SpO2: 98% 96% 98% 98%  Weight:   118.3 kg   Height:        Intake/Output Summary (Last 24 hours) at 07/21/2020 0951 Last data filed at 07/21/2020 0539 Gross per 24 hour  Intake 960 ml  Output 1200 ml  Net -240 ml   Filed Weights   07/20/20  0012 07/20/20 0845 07/21/20 0535  Weight: 118.8 kg 118.3 kg 118.3 kg    Examination:  General exam: Appears calm and comfortable  Respiratory system: Clear to auscultation. Respiratory effort normal. No respiratory distress. No conversational dyspnea.  Cardiovascular system: S1 & S2 heard, RRR. No murmurs. No pedal edema. Gastrointestinal system: Abdomen is nondistended, soft and nontender. Normal bowel sounds heard. Central nervous system: Alert and oriented. No focal neurological deficits. Speech clear.  Extremities: Symmetric in appearance  Skin: No rashes, lesions or ulcers on exposed skin  Psychiatry: Judgement and insight appear normal. Mood & affect appropriate.   Data Reviewed: I have personally reviewed following labs and imaging studies  CBC: Recent Labs  Lab 07/20/20 0010 07/21/20 0407  WBC 3.8* 4.0  HGB 12.5* 11.3*  HCT 37.8* 35.2*  MCV 97.7 99.2  PLT 174 619   Basic Metabolic Panel: Recent Labs  Lab 07/20/20 0010 07/21/20 0407  NA 138 137  K 3.5 4.4  CL 105 105  CO2 25 24  GLUCOSE 118* 120*  BUN 14 8  CREATININE 0.97 0.94  CALCIUM 8.5* 8.6*   GFR: Estimated Creatinine Clearance: 82.9 mL/min (by C-G formula based on SCr of 0.94 mg/dL). Liver Function Tests: Recent Labs  Lab 07/20/20 0030  AST 30  ALT 27  ALKPHOS 71  BILITOT 0.3  PROT 6.9  ALBUMIN 3.5   Recent Labs  Lab 07/20/20 0030  LIPASE 38   No results for input(s): AMMONIA in the last 168 hours. Coagulation Profile: Recent Labs  Lab 07/21/20 0407  INR 1.1   Cardiac Enzymes: No results for input(s): CKTOTAL, CKMB, CKMBINDEX, TROPONINI in the last 168 hours. BNP (last 3 results) No results for input(s): PROBNP in the last 8760 hours. HbA1C: No results for input(s): HGBA1C in the last 72 hours. CBG: Recent Labs  Lab 07/20/20 0006 07/20/20 1200 07/20/20 1517 07/20/20 2011 07/21/20 0611  GLUCAP 109* 69* 152* 127* 106*   Lipid Profile: Recent Labs    07/20/20 1422  CHOL  136  HDL 54  LDLCALC 69  TRIG 63  CHOLHDL 2.5   Thyroid Function Tests: Recent Labs    07/20/20 1422  TSH 1.367   Anemia Panel: No results for input(s): VITAMINB12, FOLATE, FERRITIN, TIBC, IRON, RETICCTPCT in the last 72 hours. Sepsis Labs: No results for input(s): PROCALCITON, LATICACIDVEN in the last 168 hours.  Recent Results (from the past 240 hour(s))  Resp Panel by RT-PCR (Flu A&B, Covid) Nasopharyngeal Swab     Status: None   Collection Time: 07/20/20  4:12 AM   Specimen: Nasopharyngeal Swab; Nasopharyngeal(NP) swabs in vial transport medium  Result Value Ref Range Status   SARS  Coronavirus 2 by RT PCR NEGATIVE NEGATIVE Final    Comment: (NOTE) SARS-CoV-2 target nucleic acids are NOT DETECTED.  The SARS-CoV-2 RNA is generally detectable in upper respiratory specimens during the acute phase of infection. The lowest concentration of SARS-CoV-2 viral copies this assay can detect is 138 copies/mL. A negative result does not preclude SARS-Cov-2 infection and should not be used as the sole basis for treatment or other patient management decisions. A negative result may occur with  improper specimen collection/handling, submission of specimen other than nasopharyngeal swab, presence of viral mutation(s) within the areas targeted by this assay, and inadequate number of viral copies(<138 copies/mL). A negative result must be combined with clinical observations, patient history, and epidemiological information. The expected result is Negative.  Fact Sheet for Patients:  EntrepreneurPulse.com.au  Fact Sheet for Healthcare Providers:  IncredibleEmployment.be  This test is no t yet approved or cleared by the [place] FDA and  has been authorized for detection and/or diagnosis of SARS-CoV-2 by FDA under an Emergency Use Authorization (EUA). This EUA will remain  in effect (meaning this test can be used) for the duration of the COVID-19  declaration under Section 564(b)(1) of the Act, 21 U.S.C.section 360bbb-3(b)(1), unless the authorization is terminated  or revoked sooner.       Influenza A by PCR NEGATIVE NEGATIVE Final   Influenza B by PCR NEGATIVE NEGATIVE Final    Comment: (NOTE) The Xpert Xpress SARS-CoV-2/FLU/RSV plus assay is intended as an aid in the diagnosis of influenza from Nasopharyngeal swab specimens and should not be used as a sole basis for treatment. Nasal washings and aspirates are unacceptable for Xpert Xpress SARS-CoV-2/FLU/RSV testing.  Fact Sheet for Patients: EntrepreneurPulse.com.au  Fact Sheet for Healthcare Providers: IncredibleEmployment.be  This test is not yet approved or cleared by the [place] FDA and has been authorized for detection and/or diagnosis of SARS-CoV-2 by FDA under an Emergency Use Authorization (EUA). This EUA will remain in effect (meaning this test can be used) for the duration of the COVID-19 declaration under Section 564(b)(1) of the Act, 21 U.S.C. section 360bbb-3(b)(1), unless the authorization is terminated or revoked.  Performed at [facility], [address] [place], [place] [address]       Radiology Studies: DG Chest 1 View  Result Date: 07/20/2020 CLINICAL DATA:  Diaphoresis, pallor, chest pain EXAM: CHEST  1 VIEW COMPARISON:  None. FINDINGS: Lung volumes are small, but are symmetric and are clear. No pneumothorax or pleural effusion. Cardiac size is within normal limits when accounting for poor pulmonary insufflation. Left subclavian pacemaker defibrillator is unchanged. Pulmonary vascularity is normal. No acute bone abnormality. IMPRESSION: No active disease. Electronically Signed   By: Fidela Salisbury MD   On: 07/20/2020 00:27   CT Head Wo Contrast  Result Date: 07/20/2020 CLINICAL DATA:  Altered mental status EXAM: CT HEAD WITHOUT CONTRAST TECHNIQUE: Contiguous axial images were obtained from the  base of the skull through the vertex without intravenous contrast. COMPARISON:  None. FINDINGS: Brain: Normal anatomic configuration. Parenchymal volume loss is commensurate with the patient's age. No abnormal intra or extra-axial mass lesion or fluid collection. No abnormal mass effect or midline shift. No evidence of acute intracranial hemorrhage or infarct. Ventricular size is normal. Cerebellum unremarkable. Vascular: No asymmetric hyperdense vasculature at the skull base. Skull: Intact Sinuses/Orbits: Paranasal sinuses are clear. Orbits are unremarkable. Other: Mastoid air cells and middle ear cavities are clear. IMPRESSION: No acute intracranial abnormality. Electronically Signed   By:  Fidela Salisbury MD   On: 07/20/2020 02:01   CT Angio Chest/Abd/Pel for Dissection W and/or Wo Contrast  Result Date: 07/20/2020 CLINICAL DATA:  Chest pain, diaphoresis, pallor, vomiting, altered mental status EXAM: CT ANGIOGRAPHY CHEST, ABDOMEN AND PELVIS TECHNIQUE: Non-contrast CT of the chest was initially obtained. Multidetector CT imaging through the chest, abdomen and pelvis was performed using the standard protocol during bolus administration of intravenous contrast. Multiplanar reconstructed images and MIPs were obtained and reviewed to evaluate the vascular anatomy. CONTRAST:  1103mL OMNIPAQUE IOHEXOL 350 MG/ML SOLN COMPARISON:  10/22/2017, 05/02/2014 FINDINGS: CTA CHEST FINDINGS Cardiovascular: There is extensive multi-vessel coronary artery calcification. Mild global cardiomegaly with mild left ventricular dilation. No pericardial effusion. The central pulmonary arteries are of normal caliber. The thoracic aorta is of normal caliber. No significant atherosclerotic plaque within the thoracic aorta. No evidence of intramural hematoma, aneurysm, or dissection. The arch vasculature demonstrates a normal anatomic configuration and is widely patent proximally. Left subclavian pacemaker is seen with leads within the right  atrium and right ventricle. Mediastinum/Nodes: The visualized thyroid is unremarkable. No pathologic thoracic adenopathy. The esophagus is unremarkable. Lungs/Pleura: Lungs are clear. No pleural effusion or pneumothorax. Musculoskeletal: The osseous structures are age-appropriate. No acute bone abnormality. No lytic or blastic bone lesion. Review of the MIP images confirms the above findings. CTA ABDOMEN AND PELVIS FINDINGS VASCULAR Aorta: Normal caliber. No aneurysm or dissection. Moderate atherosclerotic calcification. No periaortic inflammatory change. Celiac: Widely patent. Variant anatomy with the celiac axis giving rise only to the left gastric artery and splenic artery. No aneurysm or dissection. SMA: Mild atherosclerotic calcification proximally without evidence of hemodynamically significant stenosis. Variant anatomy with replacement of the a common hepatic artery to the superior mesenteric artery. No aneurysm or dissection. Renals: 50-75% stenosis of the left renal artery at its origin. Right renal artery is widely patent. Normal vascular morphology. No aneurysm. IMA: Roughly 50% stenosis at its origin. Distally widely patent without aneurysm or dissection. Inflow: Widely patent. Mild atherosclerotic calcification. No aneurysm or dissection. Internal iliac arteries are patent bilaterally. Veins: No obvious venous abnormality within the limitations of this arterial phase study. Review of the MIP images confirms the above findings. NON-VASCULAR Hepatobiliary: No focal liver abnormality is seen. No gallstones, gallbladder wall thickening, or biliary dilatation. Pancreas: Unremarkable Spleen: Unremarkable Adrenals/Urinary Tract: Nodular thickening of the adrenal glands is unchanged. The kidneys are unremarkable. The bladder is unremarkable. Stomach/Bowel: Severe descending and sigmoid colonic diverticulosis without superimposed inflammatory change. The stomach, small bowel, and large bowel are otherwise  unremarkable. Appendix normal. No free intraperitoneal gas or fluid. Lymphatic: No pathologic adenopathy. Reproductive: Prostate is unremarkable. Other: Subcutaneous infiltration within the a anterior abdominal wall bilaterally likely relates to subcutaneous injection. Tiny fat containing umbilical hernia. Rectum unremarkable. Musculoskeletal: No acute bone abnormality. No lytic or blastic bone lesion. Degenerative changes are seen within the lumbar spine. Sclerotic changes involving the subchondral bone of the right sacroiliac joint likely relates to degenerative change. Review of the MIP images confirms the above findings. IMPRESSION: Extensive multi-vessel coronary artery calcification. Mild global cardiomegaly with left ventricular dilation. Normal examination of the a thoracic aorta. Moderate atherosclerotic plaque of the abdominal aorta without aneurysm or dissection noted. 50-75% stenosis of the left renal artery origin. 50% stenosis of the inferior mesenteric artery at its origin. Given wide patency of the celiac axis and superior mesenteric artery, this is unlikely to be clinically significant. Severe distal colonic diverticulosis without superimposed inflammatory change. Aortic Atherosclerosis (ICD10-I70.0). Electronically Signed  By: Fidela Salisbury MD   On: 07/20/2020 02:16      Scheduled Meds: . aspirin EC  81 mg Oral Daily  . atorvastatin  40 mg Oral QHS  . clopidogrel  75 mg Oral Daily  . colchicine  0.6 mg Oral Daily  . DULoxetine  60 mg Oral Daily  . enoxaparin (LOVENOX) injection  40 mg Subcutaneous Q24H  . furosemide  40 mg Oral Daily  . gabapentin  400 mg Oral TID  . insulin aspart  0-15 Units Subcutaneous TID WC  . insulin aspart  0-5 Units Subcutaneous QHS  . insulin glargine  18 Units Subcutaneous BID  . isosorbide mononitrate  30 mg Oral Daily  . magnesium oxide  400 mg Oral Daily  . metoprolol  100 mg Oral Daily  . mirabegron ER  25 mg Oral Daily  . pantoprazole  40 mg  Oral Daily  . potassium chloride  10 mEq Oral Daily  . primidone  50 mg Oral BID  . sacubitril-valsartan  1 tablet Oral BID  . sodium chloride flush  3 mL Intravenous Q12H  . spironolactone  25 mg Oral Daily  . tamsulosin  0.8 mg Oral QHS  . traZODone  100 mg Oral QHS   Continuous Infusions: . sodium chloride       LOS: 1 day      Time spent: 25 minutes   Dessa Phi, DO [facility] 07/21/2020, 9:51 AM   Available via Epic secure chat 7am-7pm After these hours, please refer to coverage provider listed on [email or web address]

## 2020-07-21 NOTE — Progress Notes (Signed)
Progress Note  Patient Name: Johnny Ochoa. Date of Encounter: 07/21/2020  [facility] Cardiologist: None Dr. Minna Merritts, [facility]  Subjective   Feeling well.  No recurrent chest pain.   Inpatient Medications    Scheduled Meds: . aspirin EC  81 mg Oral Daily  . atorvastatin  40 mg Oral QHS  . clopidogrel  75 mg Oral Daily  . colchicine  0.6 mg Oral Daily  . DULoxetine  60 mg Oral Daily  . enoxaparin (LOVENOX) injection  40 mg Subcutaneous Q24H  . furosemide  40 mg Oral Daily  . gabapentin  400 mg Oral TID  . insulin aspart  0-15 Units Subcutaneous TID WC  . insulin aspart  0-5 Units Subcutaneous QHS  . insulin glargine  18 Units Subcutaneous BID  . isosorbide mononitrate  30 mg Oral Daily  . magnesium oxide  400 mg Oral Daily  . metoprolol  100 mg Oral Daily  . mirabegron ER  25 mg Oral Daily  . pantoprazole  40 mg Oral Daily  . potassium chloride  10 mEq Oral Daily  . primidone  50 mg Oral BID  . sacubitril-valsartan  1 tablet Oral BID  . sodium chloride flush  3 mL Intravenous Q12H  . spironolactone  25 mg Oral Daily  . tamsulosin  0.8 mg Oral QHS  . traZODone  100 mg Oral QHS   Continuous Infusions: . sodium chloride     PRN Meds: sodium chloride, acetaminophen, albuterol, nitroGLYCERIN, ondansetron (ZOFRAN) IV, polyethylene glycol, sodium chloride flush   Vital Signs    Vitals:   07/20/20 1940 07/21/20 0034 07/21/20 0535 07/21/20 0909  BP: 121/63 104/62 107/66 (!) 112/58  Pulse: 74 70 70 75  Resp: 20 20 18 20   Temp: 98.4 F (36.9 C) 98.6 F (37 C) (!) 97.4 F (36.3 C) 98.7 F (37.1 C)  TempSrc: Oral Oral Oral Oral  SpO2: 98% 96% 98% 98%  Weight:   118.3 kg   Height:        Intake/Output Summary (Last 24 hours) at 07/21/2020 1006 Last data filed at 07/21/2020 0539 Gross per 24 hour  Intake 960 ml  Output 1200 ml  Net -240 ml   Last 3 Weights 07/21/2020 07/20/2020 07/20/2020  Weight (lbs) 260 lb 12.8 oz 260 lb 12.9 oz 262 lb  Weight (kg) 118.298  kg 118.3 kg 118.842 kg      Telemetry    APVS - Personally Reviewed  ECG    n/a - Personally Reviewed  Physical Exam   VS:  BP (!) 112/58 (BP Location: Left Arm)   Pulse 75   Temp 98.7 F (37.1 C) (Oral)   Resp 20   Ht 5' 6.5" (1.689 m)   Wt 118.3 kg   SpO2 98%   BMI 41.46 kg/m  , BMI Body mass index is 41.46 kg/m. GENERAL:  Well appearing HEENT: Pupils equal round and reactive, fundi not visualized, oral mucosa unremarkable.  Edentulous NECK:  No jugular venous distention, waveform within normal limits, carotid upstroke brisk and symmetric, no bruits, LUNGS:  Clear to auscultation bilaterally HEART:  RRR.  PMI not displaced or sustained,S1 and S2 within normal limits, no S3, no S4, no clicks, no rubs, no murmurs ABD:  Flat, positive bowel sounds normal in frequency in pitch, no bruits, no rebound, no guarding, no midline pulsatile mass, no hepatomegaly, no splenomegaly EXT:  2 plus pulses throughout, trace edema, no cyanosis no clubbing SKIN:  No rashes no nodules  NEURO:  Cranial nerves II through XII grossly intact, motor grossly intact throughout PSYCH:  Cognitively intact, oriented to person place and time   Labs    High Sensitivity Troponin:   Recent Labs  Lab 07/20/20 0010 07/20/20 0251  TROPONINIHS 10 11      Chemistry Recent Labs  Lab 07/20/20 0010 07/20/20 0030 07/21/20 0407  NA 138  --  137  K 3.5  --  4.4  CL 105  --  105  CO2 25  --  24  GLUCOSE 118*  --  120*  BUN 14  --  8  CREATININE 0.97  --  0.94  CALCIUM 8.5*  --  8.6*  PROT  --  6.9  --   ALBUMIN  --  3.5  --   AST  --  30  --   ALT  --  27  --   ALKPHOS  --  71  --   BILITOT  --  0.3  --   GFRNONAA >60  --  >60  ANIONGAP 8  --  8     Hematology Recent Labs  Lab 07/20/20 0010 07/21/20 0407  WBC 3.8* 4.0  RBC 3.87* 3.55*  HGB 12.5* 11.3*  HCT 37.8* 35.2*  MCV 97.7 99.2  MCH 32.3 31.8  MCHC 33.1 32.1  RDW 14.4 14.4  PLT 174 162    BNP Recent Labs  Lab  07/20/20 1422  BNP 81.1     DDimer No results for input(s): DDIMER in the last 168 hours.   Radiology    DG Chest 1 View  Result Date: 07/20/2020 CLINICAL DATA:  Diaphoresis, pallor, chest pain EXAM: CHEST  1 VIEW COMPARISON:  None. FINDINGS: Lung volumes are small, but are symmetric and are clear. No pneumothorax or pleural effusion. Cardiac size is within normal limits when accounting for poor pulmonary insufflation. Left subclavian pacemaker defibrillator is unchanged. Pulmonary vascularity is normal. No acute bone abnormality. IMPRESSION: No active disease. Electronically Signed   By: Fidela Salisbury MD   On: 07/20/2020 00:27   CT Head Wo Contrast  Result Date: 07/20/2020 CLINICAL DATA:  Altered mental status EXAM: CT HEAD WITHOUT CONTRAST TECHNIQUE: Contiguous axial images were obtained from the base of the skull through the vertex without intravenous contrast. COMPARISON:  None. FINDINGS: Brain: Normal anatomic configuration. Parenchymal volume loss is commensurate with the patient's age. No abnormal intra or extra-axial mass lesion or fluid collection. No abnormal mass effect or midline shift. No evidence of acute intracranial hemorrhage or infarct. Ventricular size is normal. Cerebellum unremarkable. Vascular: No asymmetric hyperdense vasculature at the skull base. Skull: Intact Sinuses/Orbits: Paranasal sinuses are clear. Orbits are unremarkable. Other: Mastoid air cells and middle ear cavities are clear. IMPRESSION: No acute intracranial abnormality. Electronically Signed   By: Fidela Salisbury MD   On: 07/20/2020 02:01   CT Angio Chest/Abd/Pel for Dissection W and/or Wo Contrast  Result Date: 07/20/2020 CLINICAL DATA:  Chest pain, diaphoresis, pallor, vomiting, altered mental status EXAM: CT ANGIOGRAPHY CHEST, ABDOMEN AND PELVIS TECHNIQUE: Non-contrast CT of the chest was initially obtained. Multidetector CT imaging through the chest, abdomen and pelvis was performed using the standard  protocol during bolus administration of intravenous contrast. Multiplanar reconstructed images and MIPs were obtained and reviewed to evaluate the vascular anatomy. CONTRAST:  176mL OMNIPAQUE IOHEXOL 350 MG/ML SOLN COMPARISON:  10/22/2017, 05/02/2014 FINDINGS: CTA CHEST FINDINGS Cardiovascular: There is extensive multi-vessel coronary artery calcification. Mild global cardiomegaly with mild left ventricular dilation. No pericardial  effusion. The central pulmonary arteries are of normal caliber. The thoracic aorta is of normal caliber. No significant atherosclerotic plaque within the thoracic aorta. No evidence of intramural hematoma, aneurysm, or dissection. The arch vasculature demonstrates a normal anatomic configuration and is widely patent proximally. Left subclavian pacemaker is seen with leads within the right atrium and right ventricle. Mediastinum/Nodes: The visualized thyroid is unremarkable. No pathologic thoracic adenopathy. The esophagus is unremarkable. Lungs/Pleura: Lungs are clear. No pleural effusion or pneumothorax. Musculoskeletal: The osseous structures are age-appropriate. No acute bone abnormality. No lytic or blastic bone lesion. Review of the MIP images confirms the above findings. CTA ABDOMEN AND PELVIS FINDINGS VASCULAR Aorta: Normal caliber. No aneurysm or dissection. Moderate atherosclerotic calcification. No periaortic inflammatory change. Celiac: Widely patent. Variant anatomy with the celiac axis giving rise only to the left gastric artery and splenic artery. No aneurysm or dissection. SMA: Mild atherosclerotic calcification proximally without evidence of hemodynamically significant stenosis. Variant anatomy with replacement of the a common hepatic artery to the superior mesenteric artery. No aneurysm or dissection. Renals: 50-75% stenosis of the left renal artery at its origin. Right renal artery is widely patent. Normal vascular morphology. No aneurysm. IMA: Roughly 50% stenosis at  its origin. Distally widely patent without aneurysm or dissection. Inflow: Widely patent. Mild atherosclerotic calcification. No aneurysm or dissection. Internal iliac arteries are patent bilaterally. Veins: No obvious venous abnormality within the limitations of this arterial phase study. Review of the MIP images confirms the above findings. NON-VASCULAR Hepatobiliary: No focal liver abnormality is seen. No gallstones, gallbladder wall thickening, or biliary dilatation. Pancreas: Unremarkable Spleen: Unremarkable Adrenals/Urinary Tract: Nodular thickening of the adrenal glands is unchanged. The kidneys are unremarkable. The bladder is unremarkable. Stomach/Bowel: Severe descending and sigmoid colonic diverticulosis without superimposed inflammatory change. The stomach, small bowel, and large bowel are otherwise unremarkable. Appendix normal. No free intraperitoneal gas or fluid. Lymphatic: No pathologic adenopathy. Reproductive: Prostate is unremarkable. Other: Subcutaneous infiltration within the a anterior abdominal wall bilaterally likely relates to subcutaneous injection. Tiny fat containing umbilical hernia. Rectum unremarkable. Musculoskeletal: No acute bone abnormality. No lytic or blastic bone lesion. Degenerative changes are seen within the lumbar spine. Sclerotic changes involving the subchondral bone of the right sacroiliac joint likely relates to degenerative change. Review of the MIP images confirms the above findings. IMPRESSION: Extensive multi-vessel coronary artery calcification. Mild global cardiomegaly with left ventricular dilation. Normal examination of the a thoracic aorta. Moderate atherosclerotic plaque of the abdominal aorta without aneurysm or dissection noted. 50-75% stenosis of the left renal artery origin. 50% stenosis of the inferior mesenteric artery at its origin. Given wide patency of the celiac axis and superior mesenteric artery, this is unlikely to be clinically significant.  Severe distal colonic diverticulosis without superimposed inflammatory change. Aortic Atherosclerosis (ICD10-I70.0). Electronically Signed   By: Fidela Salisbury MD   On: 07/20/2020 02:16    Cardiac Studies   Lexiscan Myoview 07/05/20 [facility]) IMPRESSION:  1. Fixed inferior and inferolateral defects consistent with prior  infarcts. No appreciable reversibility to suggest ischemia.   2. Inferoapical dyskinesis. Severe inferior wall hypokinesis.  Moderate apical hypokinesis.   3. Left ventricular ejection fraction 36%   4. Non invasive risk stratification*: Intermediate   [facility] 04/2015: Cardiac Arteries and Lesion Findings LMCA: Lesion on LMCA: Distal subsection.15% stenosis 6 mm length . LAD: Lesion on Prox LAD: 20% stenosis 11 mm length . Lesion on 1st Diag: 40% stenosis 10 mm length . LCx: Lesion on Prox CX: 30% stenosis 8  mm length . Lesion on 1st Ob Marg: 50% stenosis 9 mm length . Lesion on 1st Ob Marg: 50% stenosis 7 mm length . RCA: Lesion on Prox RCA: 35% stenosis 9 mm length . Lesion on Dist RCA: 90% stenosis 6 mm length reduced to 5%. Pre procedure TIMI III flow was noted. Post Procedure TIMI III flow was present. Poor run off was present. The lesion was diagnosed as Low Risk (A).  Patient Profile     75 y.o. 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  Assessment & Plan    # Unstable angina: Mr. Galvan has presented to the emergency department twice in 1 month with chest pain.  It is unclear to me whether the diaphoresis nausea and vomiting that he had were related to hypotension after taking the nitroglycerin or if they were related to angina.  He is unsure what his blood pressure was after taking the medication.  Cardiac enzymes have been negative.  He had a stress test  that was consistent with prior infarct but no ischemia 06/2020.  He does have three-vessel coronary artery disease on CT-A and remote PCI.  Last cath report above with severe distal RCA disease that was medically managed.   At this point I think it is best to get a left heart cath on Monday.  He is in agreement with this plan.  Continue aspirin, clopidogrel, metoprolol, Imdur, and atorvastatin.  Risks and benefits of cardiac catheterization have been discussed with the patient.  The patient understands that risks included but are not limited to stroke (1 in 1000), death (1 in 54), kidney failure [usually temporary] (1 in 500), bleeding (1 in 200), allergic reaction [possibly serious] (1 in 200). The patient understands and agrees to proceed.   #Chronic systolic and diastolic heart failure: He appears euvolemic on exam though he does report orthopnea and edema.  His lungs sound clear and I do not see any lower extremity edema.  BNP 81 this admission.  Continue metoprolol succinate, Imdur, Entresto, and spironolactone.  We will add an SGLT2 inhibitor prior to discharge.  #Status post dual-chamber defibrillator: Implanted in 2018 for primary prevention for redo systolic function.  He has had 1 episode of NSVT per his outpatient cardiology notes.  Currently atrial pacing.  #NSVT: No arrhythmias noted on telemetry.  Continue metoprolol.  #OSA: Continue CPAP.       For questions or updates, please contact [place] Please consult [email or web address] for contact info under        Signed, Skeet Latch, MD  07/21/2020, 10:06 AM

## 2020-07-21 NOTE — H&P (View-Only) (Signed)
Progress Note  Patient Name: Johnny Ochoa. Date of Encounter: 07/21/2020  [facility] Cardiologist: None Dr. Minna Merritts, [facility]  Subjective   Feeling well.  No recurrent chest pain.   Inpatient Medications    Scheduled Meds: . aspirin EC  81 mg Oral Daily  . atorvastatin  40 mg Oral QHS  . clopidogrel  75 mg Oral Daily  . colchicine  0.6 mg Oral Daily  . DULoxetine  60 mg Oral Daily  . enoxaparin (LOVENOX) injection  40 mg Subcutaneous Q24H  . furosemide  40 mg Oral Daily  . gabapentin  400 mg Oral TID  . insulin aspart  0-15 Units Subcutaneous TID WC  . insulin aspart  0-5 Units Subcutaneous QHS  . insulin glargine  18 Units Subcutaneous BID  . isosorbide mononitrate  30 mg Oral Daily  . magnesium oxide  400 mg Oral Daily  . metoprolol  100 mg Oral Daily  . mirabegron ER  25 mg Oral Daily  . pantoprazole  40 mg Oral Daily  . potassium chloride  10 mEq Oral Daily  . primidone  50 mg Oral BID  . sacubitril-valsartan  1 tablet Oral BID  . sodium chloride flush  3 mL Intravenous Q12H  . spironolactone  25 mg Oral Daily  . tamsulosin  0.8 mg Oral QHS  . traZODone  100 mg Oral QHS   Continuous Infusions: . sodium chloride     PRN Meds: sodium chloride, acetaminophen, albuterol, nitroGLYCERIN, ondansetron (ZOFRAN) IV, polyethylene glycol, sodium chloride flush   Vital Signs    Vitals:   07/20/20 1940 07/21/20 0034 07/21/20 0535 07/21/20 0909  BP: 121/63 104/62 107/66 (!) 112/58  Pulse: 74 70 70 75  Resp: 20 20 18 20   Temp: 98.4 F (36.9 C) 98.6 F (37 C) (!) 97.4 F (36.3 C) 98.7 F (37.1 C)  TempSrc: Oral Oral Oral Oral  SpO2: 98% 96% 98% 98%  Weight:   118.3 kg   Height:        Intake/Output Summary (Last 24 hours) at 07/21/2020 1006 Last data filed at 07/21/2020 0539 Gross per 24 hour  Intake 960 ml  Output 1200 ml  Net -240 ml   Last 3 Weights 07/21/2020 07/20/2020 07/20/2020  Weight (lbs) 260 lb 12.8 oz 260 lb 12.9 oz 262 lb  Weight (kg) 118.298  kg 118.3 kg 118.842 kg      Telemetry    APVS - Personally Reviewed  ECG    n/a - Personally Reviewed  Physical Exam   VS:  BP (!) 112/58 (BP Location: Left Arm)   Pulse 75   Temp 98.7 F (37.1 C) (Oral)   Resp 20   Ht 5' 6.5" (1.689 m)   Wt 118.3 kg   SpO2 98%   BMI 41.46 kg/m  , BMI Body mass index is 41.46 kg/m. GENERAL:  Well appearing HEENT: Pupils equal round and reactive, fundi not visualized, oral mucosa unremarkable.  Edentulous NECK:  No jugular venous distention, waveform within normal limits, carotid upstroke brisk and symmetric, no bruits, LUNGS:  Clear to auscultation bilaterally HEART:  RRR.  PMI not displaced or sustained,S1 and S2 within normal limits, no S3, no S4, no clicks, no rubs, no murmurs ABD:  Flat, positive bowel sounds normal in frequency in pitch, no bruits, no rebound, no guarding, no midline pulsatile mass, no hepatomegaly, no splenomegaly EXT:  2 plus pulses throughout, trace edema, no cyanosis no clubbing SKIN:  No rashes no nodules  NEURO:  Cranial nerves II through XII grossly intact, motor grossly intact throughout PSYCH:  Cognitively intact, oriented to person place and time   Labs    High Sensitivity Troponin:   Recent Labs  Lab 07/20/20 0010 07/20/20 0251  TROPONINIHS 10 11      Chemistry Recent Labs  Lab 07/20/20 0010 07/20/20 0030 07/21/20 0407  NA 138  --  137  K 3.5  --  4.4  CL 105  --  105  CO2 25  --  24  GLUCOSE 118*  --  120*  BUN 14  --  8  CREATININE 0.97  --  0.94  CALCIUM 8.5*  --  8.6*  PROT  --  6.9  --   ALBUMIN  --  3.5  --   AST  --  30  --   ALT  --  27  --   ALKPHOS  --  71  --   BILITOT  --  0.3  --   GFRNONAA >60  --  >60  ANIONGAP 8  --  8     Hematology Recent Labs  Lab 07/20/20 0010 07/21/20 0407  WBC 3.8* 4.0  RBC 3.87* 3.55*  HGB 12.5* 11.3*  HCT 37.8* 35.2*  MCV 97.7 99.2  MCH 32.3 31.8  MCHC 33.1 32.1  RDW 14.4 14.4  PLT 174 162    BNP Recent Labs  Lab  07/20/20 1422  BNP 81.1     DDimer No results for input(s): DDIMER in the last 168 hours.   Radiology    DG Chest 1 View  Result Date: 07/20/2020 CLINICAL DATA:  Diaphoresis, pallor, chest pain EXAM: CHEST  1 VIEW COMPARISON:  None. FINDINGS: Lung volumes are small, but are symmetric and are clear. No pneumothorax or pleural effusion. Cardiac size is within normal limits when accounting for poor pulmonary insufflation. Left subclavian pacemaker defibrillator is unchanged. Pulmonary vascularity is normal. No acute bone abnormality. IMPRESSION: No active disease. Electronically Signed   By: Fidela Salisbury MD   On: 07/20/2020 00:27   CT Head Wo Contrast  Result Date: 07/20/2020 CLINICAL DATA:  Altered mental status EXAM: CT HEAD WITHOUT CONTRAST TECHNIQUE: Contiguous axial images were obtained from the base of the skull through the vertex without intravenous contrast. COMPARISON:  None. FINDINGS: Brain: Normal anatomic configuration. Parenchymal volume loss is commensurate with the patient's age. No abnormal intra or extra-axial mass lesion or fluid collection. No abnormal mass effect or midline shift. No evidence of acute intracranial hemorrhage or infarct. Ventricular size is normal. Cerebellum unremarkable. Vascular: No asymmetric hyperdense vasculature at the skull base. Skull: Intact Sinuses/Orbits: Paranasal sinuses are clear. Orbits are unremarkable. Other: Mastoid air cells and middle ear cavities are clear. IMPRESSION: No acute intracranial abnormality. Electronically Signed   By: Fidela Salisbury MD   On: 07/20/2020 02:01   CT Angio Chest/Abd/Pel for Dissection W and/or Wo Contrast  Result Date: 07/20/2020 CLINICAL DATA:  Chest pain, diaphoresis, pallor, vomiting, altered mental status EXAM: CT ANGIOGRAPHY CHEST, ABDOMEN AND PELVIS TECHNIQUE: Non-contrast CT of the chest was initially obtained. Multidetector CT imaging through the chest, abdomen and pelvis was performed using the standard  protocol during bolus administration of intravenous contrast. Multiplanar reconstructed images and MIPs were obtained and reviewed to evaluate the vascular anatomy. CONTRAST:  139mL OMNIPAQUE IOHEXOL 350 MG/ML SOLN COMPARISON:  10/22/2017, 05/02/2014 FINDINGS: CTA CHEST FINDINGS Cardiovascular: There is extensive multi-vessel coronary artery calcification. Mild global cardiomegaly with mild left ventricular dilation. No pericardial  effusion. The central pulmonary arteries are of normal caliber. The thoracic aorta is of normal caliber. No significant atherosclerotic plaque within the thoracic aorta. No evidence of intramural hematoma, aneurysm, or dissection. The arch vasculature demonstrates a normal anatomic configuration and is widely patent proximally. Left subclavian pacemaker is seen with leads within the right atrium and right ventricle. Mediastinum/Nodes: The visualized thyroid is unremarkable. No pathologic thoracic adenopathy. The esophagus is unremarkable. Lungs/Pleura: Lungs are clear. No pleural effusion or pneumothorax. Musculoskeletal: The osseous structures are age-appropriate. No acute bone abnormality. No lytic or blastic bone lesion. Review of the MIP images confirms the above findings. CTA ABDOMEN AND PELVIS FINDINGS VASCULAR Aorta: Normal caliber. No aneurysm or dissection. Moderate atherosclerotic calcification. No periaortic inflammatory change. Celiac: Widely patent. Variant anatomy with the celiac axis giving rise only to the left gastric artery and splenic artery. No aneurysm or dissection. SMA: Mild atherosclerotic calcification proximally without evidence of hemodynamically significant stenosis. Variant anatomy with replacement of the a common hepatic artery to the superior mesenteric artery. No aneurysm or dissection. Renals: 50-75% stenosis of the left renal artery at its origin. Right renal artery is widely patent. Normal vascular morphology. No aneurysm. IMA: Roughly 50% stenosis at  its origin. Distally widely patent without aneurysm or dissection. Inflow: Widely patent. Mild atherosclerotic calcification. No aneurysm or dissection. Internal iliac arteries are patent bilaterally. Veins: No obvious venous abnormality within the limitations of this arterial phase study. Review of the MIP images confirms the above findings. NON-VASCULAR Hepatobiliary: No focal liver abnormality is seen. No gallstones, gallbladder wall thickening, or biliary dilatation. Pancreas: Unremarkable Spleen: Unremarkable Adrenals/Urinary Tract: Nodular thickening of the adrenal glands is unchanged. The kidneys are unremarkable. The bladder is unremarkable. Stomach/Bowel: Severe descending and sigmoid colonic diverticulosis without superimposed inflammatory change. The stomach, small bowel, and large bowel are otherwise unremarkable. Appendix normal. No free intraperitoneal gas or fluid. Lymphatic: No pathologic adenopathy. Reproductive: Prostate is unremarkable. Other: Subcutaneous infiltration within the a anterior abdominal wall bilaterally likely relates to subcutaneous injection. Tiny fat containing umbilical hernia. Rectum unremarkable. Musculoskeletal: No acute bone abnormality. No lytic or blastic bone lesion. Degenerative changes are seen within the lumbar spine. Sclerotic changes involving the subchondral bone of the right sacroiliac joint likely relates to degenerative change. Review of the MIP images confirms the above findings. IMPRESSION: Extensive multi-vessel coronary artery calcification. Mild global cardiomegaly with left ventricular dilation. Normal examination of the a thoracic aorta. Moderate atherosclerotic plaque of the abdominal aorta without aneurysm or dissection noted. 50-75% stenosis of the left renal artery origin. 50% stenosis of the inferior mesenteric artery at its origin. Given wide patency of the celiac axis and superior mesenteric artery, this is unlikely to be clinically significant.  Severe distal colonic diverticulosis without superimposed inflammatory change. Aortic Atherosclerosis (ICD10-I70.0). Electronically Signed   By: Fidela Salisbury MD   On: 07/20/2020 02:16    Cardiac Studies   Lexiscan Myoview 07/05/20 [facility]) IMPRESSION:  1. Fixed inferior and inferolateral defects consistent with prior  infarcts. No appreciable reversibility to suggest ischemia.   2. Inferoapical dyskinesis. Severe inferior wall hypokinesis.  Moderate apical hypokinesis.   3. Left ventricular ejection fraction 36%   4. Non invasive risk stratification*: Intermediate   LHC 04/2015: Cardiac Arteries and Lesion Findings LMCA: Lesion on LMCA: Distal subsection.15% stenosis 6 mm length . LAD: Lesion on Prox LAD: 20% stenosis 11 mm length . Lesion on 1st Diag: 40% stenosis 10 mm length . LCx: Lesion on Prox CX: 30% stenosis 8  mm length . Lesion on 1st Ob Marg: 50% stenosis 9 mm length . Lesion on 1st Ob Marg: 50% stenosis 7 mm length . RCA: Lesion on Prox RCA: 35% stenosis 9 mm length . Lesion on Dist RCA: 90% stenosis 6 mm length reduced to 5%. Pre procedure TIMI III flow was noted. Post Procedure TIMI III flow was present. Poor run off was present. The lesion was diagnosed as Low Risk (A).  Patient Profile     75 y.o. 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  Assessment & Plan    # Unstable angina: Johnny Ochoa has presented to the emergency department twice in 1 month with chest pain.  It is unclear to me whether the diaphoresis nausea and vomiting that he had were related to hypotension after taking the nitroglycerin or if they were related to angina.  He is unsure what his blood pressure was after taking the medication.  Cardiac enzymes have been negative.  He had a stress test  that was consistent with prior infarct but no ischemia 06/2020.  He does have three-vessel coronary artery disease on CT-A and remote PCI.  Last cath report above with severe distal RCA disease that was medically managed.   At this point I think it is best to get a left heart cath on Monday.  He is in agreement with this plan.  Continue aspirin, clopidogrel, metoprolol, Imdur, and atorvastatin.  Risks and benefits of cardiac catheterization have been discussed with the patient.  The patient understands that risks included but are not limited to stroke (1 in 1000), death (1 in 70), kidney failure [usually temporary] (1 in 500), bleeding (1 in 200), allergic reaction [possibly serious] (1 in 200). The patient understands and agrees to proceed.   #Chronic systolic and diastolic heart failure: He appears euvolemic on exam though he does report orthopnea and edema.  His lungs sound clear and I do not see any lower extremity edema.  BNP 81 this admission.  Continue metoprolol succinate, Imdur, Entresto, and spironolactone.  We will add an SGLT2 inhibitor prior to discharge.  #Status post dual-chamber defibrillator: Implanted in 2018 for primary prevention for redo systolic function.  He has had 1 episode of NSVT per his outpatient cardiology notes.  Currently atrial pacing.  #NSVT: No arrhythmias noted on telemetry.  Continue metoprolol.  #OSA: Continue CPAP.       For questions or updates, please contact [place] Please consult [email or web address] for contact info under        Signed, Skeet Latch, MD  07/21/2020, 10:06 AM

## 2020-07-22 ENCOUNTER — Encounter (HOSPITAL_COMMUNITY): Payer: Self-pay | Admitting: Internal Medicine

## 2020-07-22 ENCOUNTER — Other Ambulatory Visit (HOSPITAL_COMMUNITY): Payer: Self-pay

## 2020-07-22 ENCOUNTER — Other Ambulatory Visit (HOSPITAL_BASED_OUTPATIENT_CLINIC_OR_DEPARTMENT_OTHER): Payer: Self-pay

## 2020-07-22 ENCOUNTER — Inpatient Hospital Stay (HOSPITAL_COMMUNITY)
Admission: EM | Disposition: A | Discharge status: Home / Self Care | Payer: Self-pay | Source: Home / Self Care | Attending: Internal Medicine

## 2020-07-22 DIAGNOSIS — I25110 Atherosclerotic heart disease of native coronary artery with unstable angina pectoris: Principal | ICD-10-CM

## 2020-07-22 HISTORY — PX: CORONARY PRESSURE/FFR STUDY: CATH118243

## 2020-07-22 HISTORY — PX: LEFT HEART CATH AND CORONARY ANGIOGRAPHY: CATH118249

## 2020-07-22 LAB — BASIC METABOLIC PANEL
Anion gap: 10 (ref 5–15)
BUN: 9 mg/dL (ref 8–23)
CO2: 24 mmol/L (ref 22–32)
Calcium: 8.7 mg/dL — ABNORMAL LOW (ref 8.9–10.3)
Chloride: 105 mmol/L (ref 98–111)
Creatinine, Ser: 0.87 mg/dL (ref 0.61–1.24)
GFR, Estimated: >60 mL/min (ref >60)
Glucose, Bld: 85 mg/dL (ref 70–99)
Potassium: 4.1 mmol/L (ref 3.5–5.1)
Sodium: 139 mmol/L (ref 135–145)

## 2020-07-22 LAB — CBC
HCT: 37.2 % — ABNORMAL LOW (ref 39.0–52.0)
Hemoglobin: 12.2 g/dL — ABNORMAL LOW (ref 13.0–17.0)
MCH: 32.3 pg (ref 26.0–34.0)
MCHC: 32.8 g/dL (ref 30.0–36.0)
MCV: 98.4 fL (ref 80.0–100.0)
Platelets: 162 10*3/uL (ref 150–400)
RBC: 3.78 MIL/uL — ABNORMAL LOW (ref 4.22–5.81)
RDW: 14 % (ref 11.5–15.5)
WBC: 4.2 10*3/uL (ref 4.0–10.5)
nRBC: 0 % (ref 0.0–0.2)

## 2020-07-22 LAB — HEMOGLOBIN A1C
Hgb A1c MFr Bld: 6.3 % — ABNORMAL HIGH (ref 4.8–5.6)
Mean Plasma Glucose: 134 mg/dL

## 2020-07-22 LAB — GLUCOSE, CAPILLARY
Glucose-Capillary: 102 mg/dL — ABNORMAL HIGH (ref 70–99)
Glucose-Capillary: 100 mg/dL — ABNORMAL HIGH (ref 70–99)
Glucose-Capillary: 135 mg/dL — ABNORMAL HIGH (ref 70–99)

## 2020-07-22 LAB — POCT ACTIVATED CLOTTING TIME: Activated Clotting Time: 285 seconds

## 2020-07-22 SURGERY — LEFT HEART CATH AND CORONARY ANGIOGRAPHY
Anesthesia: LOCAL

## 2020-07-22 MED ORDER — ISOSORBIDE MONONITRATE ER 60 MG PO TB24: 60.0000 mg | ORAL_TABLET | Freq: Every day | ORAL | Status: DC

## 2020-07-22 MED ORDER — ASPIRIN 81 MG PO CHEW
81.0000 mg | CHEWABLE_TABLET | ORAL | Status: AC
  Administered 2020-07-22: 81 mg via ORAL
  Filled 2020-07-22: qty 1

## 2020-07-22 MED ORDER — HEPARIN SODIUM (PORCINE) 1000 UNIT/ML IJ SOLN
INTRAMUSCULAR | Status: AC
  Filled 2020-07-22: qty 1

## 2020-07-22 MED ORDER — MIDAZOLAM HCL 2 MG/2ML IJ SOLN
INTRAMUSCULAR | Status: AC
  Filled 2020-07-22: qty 2

## 2020-07-22 MED ORDER — SODIUM CHLORIDE 0.9% FLUSH: 3.0000 mL | Freq: Two times a day (BID) | INTRAVENOUS | Status: DC

## 2020-07-22 MED ORDER — SODIUM CHLORIDE 0.9% FLUSH: 3.0000 mL | INTRAVENOUS | Status: DC | PRN

## 2020-07-22 MED ORDER — HEPARIN (PORCINE) IN NACL 1000-0.9 UT/500ML-% IV SOLN
INTRAVENOUS | Status: DC | PRN
  Administered 2020-07-22: 500 mL

## 2020-07-22 MED ORDER — LIDOCAINE HCL (PF) 1 % IJ SOLN
INTRAMUSCULAR | Status: AC
  Filled 2020-07-22: qty 30

## 2020-07-22 MED ORDER — VERAPAMIL HCL 2.5 MG/ML IV SOLN
INTRAVENOUS | Status: AC
  Filled 2020-07-22: qty 2

## 2020-07-22 MED ORDER — SODIUM CHLORIDE 0.9 % IV SOLN: INTRAVENOUS | Status: AC

## 2020-07-22 MED ORDER — PFIZER-BIONT COVID-19 VAC-TRIS 30 MCG/0.3ML IM SUSP
INTRAMUSCULAR | 0 refills | Status: DC
  Filled 2020-07-22: qty 0.3, 1d supply, fill #0

## 2020-07-22 MED ORDER — SODIUM CHLORIDE 0.9 % IV SOLN: INTRAVENOUS | Status: DC

## 2020-07-22 MED ORDER — LIDOCAINE HCL (PF) 1 % IJ SOLN
INTRAMUSCULAR | Status: DC | PRN
  Administered 2020-07-22: 4 mL

## 2020-07-22 MED ORDER — MIDAZOLAM HCL 2 MG/2ML IJ SOLN
INTRAMUSCULAR | Status: DC | PRN
  Administered 2020-07-22: 1 mg via INTRAVENOUS

## 2020-07-22 MED ORDER — DAPAGLIFLOZIN PROPANEDIOL 10 MG PO TABS
10.0000 mg | ORAL_TABLET | Freq: Every day | ORAL | 2 refills | Status: DC
  Filled 2020-07-22: qty 30, 30d supply, fill #0

## 2020-07-22 MED ORDER — HEPARIN (PORCINE) IN NACL 1000-0.9 UT/500ML-% IV SOLN
INTRAVENOUS | Status: AC
  Filled 2020-07-22: qty 1000

## 2020-07-22 MED ORDER — SODIUM CHLORIDE 0.9 % IV SOLN: 250.0000 mL | INTRAVENOUS | Status: DC | PRN

## 2020-07-22 MED ORDER — HYDRALAZINE HCL 20 MG/ML IJ SOLN: 10.0000 mg | INTRAMUSCULAR | Status: AC | PRN

## 2020-07-22 MED ORDER — FENTANYL CITRATE (PF) 100 MCG/2ML IJ SOLN
INTRAMUSCULAR | Status: AC
  Filled 2020-07-22: qty 2

## 2020-07-22 MED ORDER — FENTANYL CITRATE (PF) 100 MCG/2ML IJ SOLN
INTRAMUSCULAR | Status: DC | PRN
  Administered 2020-07-22: 25 ug via INTRAVENOUS

## 2020-07-22 MED ORDER — ISOSORBIDE MONONITRATE ER 60 MG PO TB24
60.0000 mg | ORAL_TABLET | Freq: Every day | ORAL | 2 refills | Status: DC
  Filled 2020-07-22: qty 30, 30d supply, fill #0

## 2020-07-22 MED ORDER — ENOXAPARIN SODIUM 40 MG/0.4ML IJ SOSY: 40.0000 mg | PREFILLED_SYRINGE | INTRAMUSCULAR | Status: DC

## 2020-07-22 MED ORDER — HEPARIN SODIUM (PORCINE) 1000 UNIT/ML IJ SOLN
INTRAMUSCULAR | Status: DC | PRN
  Administered 2020-07-22: 6000 [IU] via INTRAVENOUS
  Administered 2020-07-22: 5000 [IU] via INTRAVENOUS

## 2020-07-22 SURGICAL SUPPLY — 14 items
CATH 5FR JL3.5 JR4 ANG PIG MP (CATHETERS) ×2 IMPLANT
CATH LAUNCHER 6FR EBU3.5 (CATHETERS) ×2 IMPLANT
CATH VISTA GUIDE 6FR JR4 (CATHETERS) ×2 IMPLANT
DEVICE RAD COMP TR BAND LRG (VASCULAR PRODUCTS) ×2 IMPLANT
GLIDESHEATH SLEND SS 6F .021 (SHEATH) ×2 IMPLANT
GUIDEWIRE INQWIRE 1.5J.035X260 (WIRE) ×1 IMPLANT
GUIDEWIRE PRESSURE X 175 (WIRE) ×2 IMPLANT
INQWIRE 1.5J .035X260CM (WIRE) ×2
KIT ESSENTIALS PG (KITS) ×2 IMPLANT
KIT HEART LEFT (KITS) ×2 IMPLANT
PACK CARDIAC CATHETERIZATION (CUSTOM PROCEDURE TRAY) ×2 IMPLANT
SHEATH PROBE COVER 6X72 (BAG) ×2 IMPLANT
TRANSDUCER W/STOPCOCK (MISCELLANEOUS) ×2 IMPLANT
TUBING CIL FLEX 10 FLL-RA (TUBING) ×2 IMPLANT

## 2020-07-22 NOTE — Progress Notes (Signed)
Progress Note  Patient Name: Johnny Ochoa. Date of Encounter: 07/22/2020  [facility] Cardiologist: Dr. Minna Merritts, [facility]  Subjective   Pt had cath this am. Has mild - moderate CAD .  No significant stenosis   troponins are negative.   Inpatient Medications    Scheduled Meds: . aspirin EC  81 mg Oral Daily  . atorvastatin  40 mg Oral QHS  . clopidogrel  75 mg Oral Daily  . colchicine  0.6 mg Oral Daily  . DULoxetine  60 mg Oral Daily  . [START ON 07/23/2020] enoxaparin (LOVENOX) injection  40 mg Subcutaneous Q24H  . furosemide  40 mg Oral Daily  . gabapentin  400 mg Oral TID  . insulin aspart  0-15 Units Subcutaneous TID WC  . insulin aspart  0-5 Units Subcutaneous QHS  . insulin glargine  18 Units Subcutaneous BID  . isosorbide mononitrate  30 mg Oral Daily  . magnesium oxide  400 mg Oral Daily  . metoprolol  100 mg Oral Daily  . mirabegron ER  25 mg Oral Daily  . pantoprazole  40 mg Oral Daily  . potassium chloride  10 mEq Oral Daily  . primidone  50 mg Oral BID  . sacubitril-valsartan  1 tablet Oral BID  . sodium chloride flush  3 mL Intravenous Q12H  . sodium chloride flush  3 mL Intravenous Q12H  . spironolactone  25 mg Oral Daily  . tamsulosin  0.8 mg Oral QHS  . traZODone  100 mg Oral QHS   Continuous Infusions: . sodium chloride    . sodium chloride 50 mL/hr at 07/22/20 1040  . sodium chloride     PRN Meds: sodium chloride, sodium chloride, acetaminophen, albuterol, hydrALAZINE, nitroGLYCERIN, ondansetron (ZOFRAN) IV, polyethylene glycol, sodium chloride flush, sodium chloride flush   Vital Signs    Vitals:   07/22/20 1006 07/22/20 1009 07/22/20 1017 07/22/20 1032  BP: (!) 154/85 (!) 125/59 117/64 115/69  Pulse: 74 74 73 73  Resp: 20     Temp: 98.1 F (36.7 C)     TempSrc: Oral     SpO2: 98% 99% 98% 97%  Weight:      Height:        Intake/Output Summary (Last 24 hours) at 07/22/2020 1124 Last data filed at 07/22/2020 0092 Gross per 24 hour   Intake 720 ml  Output 900 ml  Net -180 ml   Last 3 Weights 07/22/2020 07/21/2020 07/20/2020  Weight (lbs) 263 lb 3.7 oz 260 lb 12.8 oz 260 lb 12.9 oz  Weight (kg) 119.4 kg 118.298 kg 118.3 kg      Telemetry     AV pacing  - Personally Reviewed  ECG     - Personally Reviewed  Physical Exam   GEN:  obese, elderly male, NAD  Neck: No JVD Cardiac: RRR, no murmurs, rubs, or gallops.  Respiratory: Clear to auscultation bilaterally. GI: Soft, nontender, non-distended  MS: No edema; No deformity.  Right radial cath site looks good.  TR band is in place.  Neuro:  Nonfocal  Psych: Normal affect   Labs    High Sensitivity Troponin:   Recent Labs  Lab 07/20/20 0010 07/20/20 0251  TROPONINIHS 10 11      Chemistry Recent Labs  Lab 07/20/20 0010 07/20/20 0030 07/21/20 0407  NA 138  --  137  K 3.5  --  4.4  CL 105  --  105  CO2 25  --  24  GLUCOSE  118*  --  120*  BUN 14  --  8  CREATININE 0.97  --  0.94  CALCIUM 8.5*  --  8.6*  PROT  --  6.9  --   ALBUMIN  --  3.5  --   AST  --  30  --   ALT  --  27  --   ALKPHOS  --  71  --   BILITOT  --  0.3  --   GFRNONAA >60  --  >60  ANIONGAP 8  --  8     Hematology Recent Labs  Lab 07/20/20 0010 07/21/20 0407  WBC 3.8* 4.0  RBC 3.87* 3.55*  HGB 12.5* 11.3*  HCT 37.8* 35.2*  MCV 97.7 99.2  MCH 32.3 31.8  MCHC 33.1 32.1  RDW 14.4 14.4  PLT 174 162    BNP Recent Labs  Lab 07/20/20 1422  BNP 81.1     DDimer No results for input(s): DDIMER in the last 168 hours.   Radiology    CARDIAC CATHETERIZATION  Result Date: 07/22/2020 Conclusions: 1. Mild to moderate, non-obstructive coronary artery disease, as detailed below. 2. Patent LCx stent(s) with mild in-stent restenosis. 3. Upper normal left ventricular filling pressure. Recommendations: 1. Continue medical therapy and risk factor modification. Nelva Bush, MD [facility]     Cardiac Studies      Patient Profile     75 y.o. male with moderate CAD,  pacer admitted with CP   Assessment & Plan    1.  CAD :  Has moderate CAD by cath this am .  Will increase his imdur to 60 mg a day  He has been instructed about caring for his right radial cath site.  He should be able to go home soon.    Follow up with Dr. Minna Merritts in [facility] .   [facility] will sign off.   Medication Recommendations:  Cont meds.  Other recommendations (labs, testing, etc):   Follow up as an outpatient:  With Dr. Minna Merritts.    For questions or updates, please contact [place] Please consult [email or web address] for contact info under        Signed, Mertie Moores, MD  07/22/2020, 11:24 AM

## 2020-07-22 NOTE — Plan of Care (Signed)
  Problem: Clinical Measurements: Goal: Cardiovascular complication will be avoided Outcome: Progressing   Problem: Activity: Goal: Risk for activity intolerance will decrease Outcome: Progressing   Problem: Nutrition: Goal: Adequate nutrition will be maintained Outcome: Progressing   Problem: Coping: Goal: Level of anxiety will decrease Outcome: Progressing   Problem: Elimination: Goal: Will not experience complications related to urinary retention Outcome: Progressing   

## 2020-07-22 NOTE — Progress Notes (Signed)
D/C instructions given and reivewed. Pt still has TR band in p lace at this time.

## 2020-07-22 NOTE — TOC Transition Note (Signed)
Transition of Care [facility]) - CM/SW Discharge Note   Patient Details  Name: Johnny Ochoa. MRN: [id number] Date of Birth: 1945-07-13  Transition of Care [facility]) CM/SW Contact:  Zenon Mayo, RN Phone Number: 07/22/2020, 12:24 PM   Clinical Narrative:    Patient is for dc today, NCM informed wife about the [place].  TOC filled the first 30 days free and the refills will be 25.00 .   Final next level of care: Home/Self Care Barriers to Discharge: No Barriers Identified   Patient Goals and CMS Choice Patient states their goals for this hospitalization and ongoing recovery are:: return home   Choice offered to / list presented to : NA  Discharge Placement                       Discharge Plan and Services                  DME Agency: NA       HH Arranged: NA          Social Determinants of Health (SDOH) Interventions     Readmission Risk Interventions No flowsheet data found.

## 2020-07-22 NOTE — Interval H&P Note (Signed)
History and Physical Interval Note:  07/22/2020 8:46 AM  Johnny Ochoa.  has presented today for surgery, with the diagnosis of unstable angina.  The various methods of treatment have been discussed with the patient and family. After consideration of risks, benefits and other options for treatment, the patient has consented to  Procedure(s): LEFT HEART CATH AND CORONARY ANGIOGRAPHY (N/A) as a surgical intervention.  The patient's history has been reviewed, patient examined, no change in status, stable for surgery.  I have reviewed the patient's chart and labs.  Questions were answered to the patient's satisfaction.    Cath Lab Visit (complete for each Cath Lab visit)  Clinical Evaluation Leading to the Procedure:   ACS: Yes.  (unstable angina)  Non-ACS:  N/A  Brittnay Pigman

## 2020-07-22 NOTE — Progress Notes (Signed)
Patient's evening blood glucose was below coverage perimeters. Held long acting insulin because patient is NPO after midnight due to having a cardiac cath in the am. Will continue to monitor patient's blood sugar throughout the night.

## 2020-07-22 NOTE — Discharge Summary (Signed)
Physician Discharge Summary  Johnny Ochoa. VHQ:[id number] DOB: Jan 30, 1946 DOA: 07/20/2020  PCP: Janie Morning, DO  Admit date: 07/20/2020 Discharge date: 07/22/2020  Admitted From: Home Disposition:  Home  Recommendations for Outpatient Follow-up:  1. Follow up with PCP in 1 week 2. Follow up with Cardiology in 2 weeks   Discharge Condition: Stable CODE STATUS: Full  Diet recommendation: Heart healthy   Brief/Interim Summary: Johnny Ochoais a 75 y.o.malewith medical history significant ofPTSD; morbid obesity; OSA on CPAP; CAD; DM; HTN; prostate CA; and chronic systolic CHF s/p AICD placement presenting with chest pain.He reports that he was sorting out his medications when he developed acute onset of chest tightness, substernal. He has associated SOB and diaphoresis. He took NTG and had relief but it recurred "with a vengeance." He developed n/v and his wife called 911. He tried to walk and was staggering, had blurry vision. Review of records shows hospitalization at [facility] from 5/19-20 for chest pain. Nuclear stress with evidence of prior infarcts but no acute ischemia. EF estimated to be 36%.  Patient was admitted for chest pain work-up, cardiology consulted.  Patient underwent heart cath on 6/6, showed mild to moderate CAD without significant stenosis.  Patient's Imdur was increased, to follow-up with his cardiologist as an outpatient.  Discharge Diagnoses:  Principal Problem:   Unstable angina (HCC) Active Problems:   Essential hypertension   CAD in native artery   Hyperlipidemia   Morbid obesity ([place])   Depression with anxiety   Cardiac pacemaker in situ   OSA on CPAP   Chronic combined systolic and diastolic heart failure ([place])   Unstable angina -Recently had a negative nuclear stress test at [facility] regional hospital -Troponin negative -Cardiology following -Status post heart cath 6/6 revealed mild to moderate CAD -Continue aspirin, Plavix, Imdur,  Lipitor, Toprol -Imdur dosing increased  Chronic combined systolic and diastolic heart failure -BNP 81.1 -Continue Lasix, Aldactone, Entresto, started on Farxiga  Hypertension -Continue Toprol  Hyperlipidemia -Continue Lipitor  Diabetes mellitus -Continue Lantus, sliding scale insulin  Mood disorder -Continue Cymbalta  OSA -Continue CPAP nightly  Morbid obesity -Estimated body mass index is 41.46 kg/m as calculated from the following:   Height as of this encounter: 5' 6.5" (1.689 m).   Weight as of this encounter: 118.3 kg.    Discharge Instructions  Discharge Instructions    (HEART FAILURE PATIENTS) Call MD:  Anytime you have any of the following symptoms: 1) 3 pound weight gain in 24 hours or 5 pounds in 1 week 2) shortness of breath, with or without a dry hacking cough 3) swelling in the hands, feet or stomach 4) if you have to sleep on extra pillows at night in order to breathe.   Complete by: As directed    Call MD for:  difficulty breathing, headache or visual disturbances   Complete by: As directed    Call MD for:  extreme fatigue   Complete by: As directed    Call MD for:  persistant dizziness or light-headedness   Complete by: As directed    Call MD for:  persistant nausea and vomiting   Complete by: As directed    Call MD for:  redness, tenderness, or signs of infection (pain, swelling, redness, odor or green/yellow discharge around incision site)   Complete by: As directed    Call MD for:  severe uncontrolled pain   Complete by: As directed    Call MD for:  temperature >100.4  Complete by: As directed    Diet - low sodium heart healthy   Complete by: As directed    Discharge instructions   Complete by: As directed    You were cared for by a hospitalist during your hospital stay. If you have any questions about your discharge medications or the care you received while you were in the hospital after you are discharged, you can call the unit and  ask to speak with the hospitalist on call if the hospitalist that took care of you is not available. Once you are discharged, your primary care physician will handle any further medical issues. Please note that NO REFILLS for any discharge medications will be authorized once you are discharged, as it is imperative that you return to your primary care physician (or establish a relationship with a primary care physician if you do not have one) for your aftercare needs so that they can reassess your need for medications and monitor your lab values.   Increase activity slowly   Complete by: As directed      Allergies as of 07/22/2020      Reactions   Metformin Diarrhea      Medication List    TAKE these medications   acetaminophen 325 MG tablet Commonly known as: TYLENOL Take 650 mg by mouth 3 (three) times daily as needed (pain).   albuterol 108 (90 Base) MCG/ACT inhaler Commonly known as: VENTOLIN HFA Inhale 2 puffs into the lungs every 4 (four) hours as needed for wheezing or shortness of breath.   ascorbic acid 500 MG tablet Commonly known as: VITAMIN C Take 500 mg by mouth daily.   aspirin EC 81 MG tablet Take 81 mg by mouth daily.   atorvastatin 80 MG tablet Commonly known as: LIPITOR Take 40 mg by mouth at bedtime.   Cholecalciferol 50 MCG (2000 UT) Tabs Take 2,000 Units by mouth daily.   clopidogrel 75 MG tablet Commonly known as: PLAVIX Take 75 mg by mouth daily.   colchicine 0.6 MG tablet TAKE 1 TABLET BY MOUTH DAILY What changed: additional instructions   dapagliflozin propanediol 10 MG Tabs tablet Commonly known as: FARXIGA Take 1 tablet (10 mg total) by mouth daily before breakfast.   diclofenac Sodium 1 % Gel Commonly known as: VOLTAREN Apply 2 g topically every 4 (four) hours as needed (back pain).   docusate sodium 100 MG capsule Commonly known as: COLACE Take 100 mg by mouth 2 (two) times daily.   DULoxetine 60 MG capsule Commonly known as:  CYMBALTA Take 60 mg by mouth daily.   furosemide 40 MG tablet Commonly known as: LASIX Take 1 tablet (40 mg total) by mouth daily. Take an extra dose if you gain 2 lbs in 24 hours. What changed:   when to take this  additional instructions   gabapentin 400 MG capsule Commonly known as: NEURONTIN Take 400 mg by mouth 3 (three) times daily.   indomethacin 50 MG capsule Commonly known as: INDOCIN TAKE 1 CAPSULE(50 MG) BY MOUTH TWICE DAILY WITH A MEAL What changed: See the new instructions.   insulin aspart 100 UNIT/ML FlexPen Commonly known as: NOVOLOG Inject 18 Units into the skin 2 (two) times daily with a meal.   insulin glargine 100 UNIT/ML Solostar Pen Commonly known as: LANTUS Inject 18 Units into the skin 2 (two) times daily.   Iron 325 (65 Fe) MG Tabs Take 325 mg by mouth daily.   isosorbide mononitrate 60 MG 24 hr tablet Commonly  known as: IMDUR Take 1 tablet (60 mg total) by mouth daily. What changed:   medication strength  how much to take   Magnesium 400 MG Tabs Take 400 mg by mouth daily.   metoprolol 200 MG 24 hr tablet Commonly known as: TOPROL-XL Take 100 mg by mouth daily. Take with or immediately following a meal.   miconazole 2 % powder Commonly known as: MICOTIN Apply 1 application topically daily as needed for itching.   mirabegron ER 25 MG Tb24 tablet Commonly known as: MYRBETRIQ Take 25 mg by mouth daily.   multivitamin with minerals Tabs tablet Take 1 tablet by mouth daily.   nitroGLYCERIN 0.4 MG SL tablet Commonly known as: NITROSTAT Place 0.4 mg under the tongue every 5 (five) minutes as needed for chest pain.   omeprazole 20 MG capsule Commonly known as: PRILOSEC Take 40 mg by mouth daily.   Ozempic (1 MG/DOSE) 4 MG/3ML Sopn Generic drug: Semaglutide (1 MG/DOSE) Inject 1 mg as directed once a week.   polyethylene glycol 17 g packet Commonly known as: MIRALAX / GLYCOLAX Take 17 g by mouth daily as needed (constipation).    potassium chloride 10 MEQ tablet Commonly known as: KLOR-CON Take 10 mEq by mouth daily.   primidone 50 MG tablet Commonly known as: MYSOLINE Take 50 mg by mouth 2 (two) times daily.   sacubitril-valsartan 49-51 MG Commonly known as: ENTRESTO Take 1 tablet 2 (two) times daily by mouth.   spironolactone 25 MG tablet Commonly known as: ALDACTONE Take 25 mg by mouth daily.   tamsulosin 0.4 MG Caps capsule Commonly known as: FLOMAX Take 0.8 mg by mouth at bedtime.   terbinafine 1 % cream Commonly known as: LAMISIL Apply 1 application topically daily as needed for itching.   traZODone 100 MG tablet Commonly known as: DESYREL Take 100 mg by mouth at bedtime.       Follow-up Information    Janie Morning, DO. Schedule an appointment as soon as possible for a visit in 1 week(s).   Specialty: Family Medicine Contact information: [address] [place] [address] [phone]        Mahala Menghini, MD. Schedule an appointment as soon as possible for a visit in 2 week(s).   Specialty: Cardiology Contact information: [address] [place] [address] [phone]              Allergies  Allergen Reactions  . Metformin Diarrhea    Consultations:  Cardiology   Procedures/Studies: DG Chest 1 View  Result Date: 07/20/2020 CLINICAL DATA:  Diaphoresis, pallor, chest pain EXAM: CHEST  1 VIEW COMPARISON:  None. FINDINGS: Lung volumes are small, but are symmetric and are clear. No pneumothorax or pleural effusion. Cardiac size is within normal limits when accounting for poor pulmonary insufflation. Left subclavian pacemaker defibrillator is unchanged. Pulmonary vascularity is normal. No acute bone abnormality. IMPRESSION: No active disease. Electronically Signed   By: Fidela Salisbury MD   On: 07/20/2020 00:27   CT Head Wo Contrast  Result Date: 07/20/2020 CLINICAL DATA:  Altered mental status EXAM: CT HEAD WITHOUT CONTRAST TECHNIQUE:  Contiguous axial images were obtained from the base of the skull through the vertex without intravenous contrast. COMPARISON:  None. FINDINGS: Brain: Normal anatomic configuration. Parenchymal volume loss is commensurate with the patient's age. No abnormal intra or extra-axial mass lesion or fluid collection. No abnormal mass effect or midline shift. No evidence of acute intracranial hemorrhage or infarct. Ventricular size is  normal. Cerebellum unremarkable. Vascular: No asymmetric hyperdense vasculature at the skull base. Skull: Intact Sinuses/Orbits: Paranasal sinuses are clear. Orbits are unremarkable. Other: Mastoid air cells and middle ear cavities are clear. IMPRESSION: No acute intracranial abnormality. Electronically Signed   By: Fidela Salisbury MD   On: 07/20/2020 02:01   CT Head Wo Contrast  Result Date: 07/18/2020 CLINICAL DATA:  Pain following fall EXAM: CT HEAD WITHOUT CONTRAST TECHNIQUE: Contiguous axial images were obtained from the base of the skull through the vertex without intravenous contrast. COMPARISON:  November 30, 2013 FINDINGS: Brain: There is mild diffuse atrophy. There is no intracranial mass, hemorrhage, extra-axial fluid collection, or midline shift. There is slight decreased attenuation in portions of the the centra semiovale bilaterally. Elsewhere brain parenchyma appears unremarkable. No appreciable acute infarct. Vascular: No hyperdense vessel. Calcification noted in each carotid siphon region. Skull: Bony calvarium appears intact. Sinuses/Orbits: There is mucosal thickening in several ethmoid air cells. Other visualized paranasal sinuses are clear. Orbits appear symmetric bilaterally. Other: Mastoid air cells are clear. IMPRESSION: Mild atrophy with mild periventricular small vessel disease. No acute infarct. No mass or hemorrhage. There are foci of arterial vascular calcification. There is mucosal thickening in several ethmoid air cells. Electronically Signed   By: Lowella Grip III M.D.   On: 07/18/2020 13:40   CARDIAC CATHETERIZATION  Result Date: 07/22/2020 Conclusions: 1. Mild to moderate, non-obstructive coronary artery disease, as detailed below. 2. Patent LCx stent(s) with mild in-stent restenosis. 3. Upper normal left ventricular filling pressure. Recommendations: 1. Continue medical therapy and risk factor modification. Nelva Bush, MD [facility]    CT Angio Chest/Abd/Pel for Dissection W and/or Wo Contrast  Result Date: 07/20/2020 CLINICAL DATA:  Chest pain, diaphoresis, pallor, vomiting, altered mental status EXAM: CT ANGIOGRAPHY CHEST, ABDOMEN AND PELVIS TECHNIQUE: Non-contrast CT of the chest was initially obtained. Multidetector CT imaging through the chest, abdomen and pelvis was performed using the standard protocol during bolus administration of intravenous contrast. Multiplanar reconstructed images and MIPs were obtained and reviewed to evaluate the vascular anatomy. CONTRAST:  168mL OMNIPAQUE IOHEXOL 350 MG/ML SOLN COMPARISON:  10/22/2017, 05/02/2014 FINDINGS: CTA CHEST FINDINGS Cardiovascular: There is extensive multi-vessel coronary artery calcification. Mild global cardiomegaly with mild left ventricular dilation. No pericardial effusion. The central pulmonary arteries are of normal caliber. The thoracic aorta is of normal caliber. No significant atherosclerotic plaque within the thoracic aorta. No evidence of intramural hematoma, aneurysm, or dissection. The arch vasculature demonstrates a normal anatomic configuration and is widely patent proximally. Left subclavian pacemaker is seen with leads within the right atrium and right ventricle. Mediastinum/Nodes: The visualized thyroid is unremarkable. No pathologic thoracic adenopathy. The esophagus is unremarkable. Lungs/Pleura: Lungs are clear. No pleural effusion or pneumothorax. Musculoskeletal: The osseous structures are age-appropriate. No acute bone abnormality. No lytic or blastic bone  lesion. Review of the MIP images confirms the above findings. CTA ABDOMEN AND PELVIS FINDINGS VASCULAR Aorta: Normal caliber. No aneurysm or dissection. Moderate atherosclerotic calcification. No periaortic inflammatory change. Celiac: Widely patent. Variant anatomy with the celiac axis giving rise only to the left gastric artery and splenic artery. No aneurysm or dissection. SMA: Mild atherosclerotic calcification proximally without evidence of hemodynamically significant stenosis. Variant anatomy with replacement of the a common hepatic artery to the superior mesenteric artery. No aneurysm or dissection. Renals: 50-75% stenosis of the left renal artery at its origin. Right renal artery is widely patent. Normal vascular morphology. No aneurysm. IMA: Roughly 50% stenosis at its origin. Distally widely  patent without aneurysm or dissection. Inflow: Widely patent. Mild atherosclerotic calcification. No aneurysm or dissection. Internal iliac arteries are patent bilaterally. Veins: No obvious venous abnormality within the limitations of this arterial phase study. Review of the MIP images confirms the above findings. NON-VASCULAR Hepatobiliary: No focal liver abnormality is seen. No gallstones, gallbladder wall thickening, or biliary dilatation. Pancreas: Unremarkable Spleen: Unremarkable Adrenals/Urinary Tract: Nodular thickening of the adrenal glands is unchanged. The kidneys are unremarkable. The bladder is unremarkable. Stomach/Bowel: Severe descending and sigmoid colonic diverticulosis without superimposed inflammatory change. The stomach, small bowel, and large bowel are otherwise unremarkable. Appendix normal. No free intraperitoneal gas or fluid. Lymphatic: No pathologic adenopathy. Reproductive: Prostate is unremarkable. Other: Subcutaneous infiltration within the a anterior abdominal wall bilaterally likely relates to subcutaneous injection. Tiny fat containing umbilical hernia. Rectum unremarkable.  Musculoskeletal: No acute bone abnormality. No lytic or blastic bone lesion. Degenerative changes are seen within the lumbar spine. Sclerotic changes involving the subchondral bone of the right sacroiliac joint likely relates to degenerative change. Review of the MIP images confirms the above findings. IMPRESSION: Extensive multi-vessel coronary artery calcification. Mild global cardiomegaly with left ventricular dilation. Normal examination of the a thoracic aorta. Moderate atherosclerotic plaque of the abdominal aorta without aneurysm or dissection noted. 50-75% stenosis of the left renal artery origin. 50% stenosis of the inferior mesenteric artery at its origin. Given wide patency of the celiac axis and superior mesenteric artery, this is unlikely to be clinically significant. Severe distal colonic diverticulosis without superimposed inflammatory change. Aortic Atherosclerosis (ICD10-I70.0). Electronically Signed   By: Fidela Salisbury MD   On: 07/20/2020 02:16       Discharge Exam: Vitals:   07/22/20 1017 07/22/20 1032  BP: 117/64 115/69  Pulse: 73 73  Resp:    Temp:    SpO2: 98% 97%    General: Pt is alert, awake, not in acute distress Cardiovascular: RRR, S1/S2 +, no edema Respiratory: CTA bilaterally, no wheezing, no rhonchi, no respiratory distress, no conversational dyspnea  Abdominal: Soft, NT, ND, bowel sounds + Extremities: no edema, no cyanosis Psych: Normal mood and affect, stable judgement and insight     The results of significant diagnostics from this hospitalization (including imaging, microbiology, ancillary and laboratory) are listed below for reference.     Microbiology: Recent Results (from the past 240 hour(s))  Resp Panel by RT-PCR (Flu A&B, Covid) Nasopharyngeal Swab     Status: None   Collection Time: 07/20/20  4:12 AM   Specimen: Nasopharyngeal Swab; Nasopharyngeal(NP) swabs in vial transport medium  Result Value Ref Range Status   SARS Coronavirus 2 by RT  PCR NEGATIVE NEGATIVE Final    Comment: (NOTE) SARS-CoV-2 target nucleic acids are NOT DETECTED.  The SARS-CoV-2 RNA is generally detectable in upper respiratory specimens during the acute phase of infection. The lowest concentration of SARS-CoV-2 viral copies this assay can detect is 138 copies/mL. A negative result does not preclude SARS-Cov-2 infection and should not be used as the sole basis for treatment or other patient management decisions. A negative result may occur with  improper specimen collection/handling, submission of specimen other than nasopharyngeal swab, presence of viral mutation(s) within the areas targeted by this assay, and inadequate number of viral copies(<138 copies/mL). A negative result must be combined with clinical observations, patient history, and epidemiological information. The expected result is Negative.  Fact Sheet for Patients:  EntrepreneurPulse.com.au  Fact Sheet for Healthcare Providers:  IncredibleEmployment.be  This test is no t yet approved or cleared  by the [place] and  has been authorized for detection and/or diagnosis of SARS-CoV-2 by FDA under an Emergency Use Authorization (EUA). This EUA will remain  in effect (meaning this test can be used) for the duration of the COVID-19 declaration under Section 564(b)(1) of the Act, 21 U.S.C.section 360bbb-3(b)(1), unless the authorization is terminated  or revoked sooner.       Influenza A by PCR NEGATIVE NEGATIVE Final   Influenza B by PCR NEGATIVE NEGATIVE Final    Comment: (NOTE) The Xpert Xpress SARS-CoV-2/FLU/RSV plus assay is intended as an aid in the diagnosis of influenza from Nasopharyngeal swab specimens and should not be used as a sole basis for treatment. Nasal washings and aspirates are unacceptable for Xpert Xpress SARS-CoV-2/FLU/RSV testing.  Fact Sheet for Patients: EntrepreneurPulse.com.au  Fact Sheet for  Healthcare Providers: IncredibleEmployment.be  This test is not yet approved or cleared by the [place] FDA and has been authorized for detection and/or diagnosis of SARS-CoV-2 by FDA under an Emergency Use Authorization (EUA). This EUA will remain in effect (meaning this test can be used) for the duration of the COVID-19 declaration under Section 564(b)(1) of the Act, 21 U.S.C. section 360bbb-3(b)(1), unless the authorization is terminated or revoked.  Performed at [facility], [address] [place], [place] [address]      Labs: BNP (last 3 results) Recent Labs    07/20/20 1422  BNP 72.5   Basic Metabolic Panel: Recent Labs  Lab 07/20/20 0010 07/21/20 0407  NA 138 137  K 3.5 4.4  CL 105 105  CO2 25 24  GLUCOSE 118* 120*  BUN 14 8  CREATININE 0.97 0.94  CALCIUM 8.5* 8.6*   Liver Function Tests: Recent Labs  Lab 07/20/20 0030  AST 30  ALT 27  ALKPHOS 71  BILITOT 0.3  PROT 6.9  ALBUMIN 3.5   Recent Labs  Lab 07/20/20 0030  LIPASE 38   No results for input(s): AMMONIA in the last 168 hours. CBC: Recent Labs  Lab 07/20/20 0010 07/21/20 0407  WBC 3.8* 4.0  HGB 12.5* 11.3*  HCT 37.8* 35.2*  MCV 97.7 99.2  PLT 174 162   Cardiac Enzymes: No results for input(s): CKTOTAL, CKMB, CKMBINDEX, TROPONINI in the last 168 hours. BNP: Invalid input(s): POCBNP CBG: Recent Labs  Lab 07/21/20 1103 07/21/20 1655 07/21/20 2101 07/22/20 0414 07/22/20 0739  GLUCAP 128* 146* 106* 102* 100*   D-Dimer No results for input(s): DDIMER in the last 72 hours. Hgb A1c No results for input(s): HGBA1C in the last 72 hours. Lipid Profile Recent Labs    07/20/20 1422  CHOL 136  HDL 54  LDLCALC 69  TRIG 63  CHOLHDL 2.5   Thyroid function studies Recent Labs    07/20/20 1422  TSH 1.367   Anemia work up No results for input(s): VITAMINB12, FOLATE, FERRITIN, TIBC, IRON, RETICCTPCT in the last 72 hours. Urinalysis     Component Value Date/Time   COLORURINE YELLOW 03/11/2018 0443   APPEARANCEUR CLEAR 03/11/2018 0443   LABSPEC 1.020 03/11/2018 0443   PHURINE 6.0 03/11/2018 0443   GLUCOSEU NEGATIVE 03/11/2018 0443   HGBUR NEGATIVE 03/11/2018 [place] 03/11/2018 [place] 03/11/2018 0443   PROTEINUR NEGATIVE 03/11/2018 0443   UROBILINOGEN 1.0 06/19/2014 2158   NITRITE NEGATIVE 03/11/2018 0443   LEUKOCYTESUR NEGATIVE 03/11/2018 0443   Sepsis Labs Invalid input(s): PROCALCITONIN,  WBC,  LACTICIDVEN Microbiology Recent Results (from the past 240 hour(s))  Resp Panel by RT-PCR (Flu A&B, Covid) Nasopharyngeal Swab     Status: None   Collection Time: 07/20/20  4:12 AM   Specimen: Nasopharyngeal Swab; Nasopharyngeal(NP) swabs in vial transport medium  Result Value Ref Range Status   SARS Coronavirus 2 by RT PCR NEGATIVE NEGATIVE Final    Comment: (NOTE) SARS-CoV-2 target nucleic acids are NOT DETECTED.  The SARS-CoV-2 RNA is generally detectable in upper respiratory specimens during the acute phase of infection. The lowest concentration of SARS-CoV-2 viral copies this assay can detect is 138 copies/mL. A negative result does not preclude SARS-Cov-2 infection and should not be used as the sole basis for treatment or other patient management decisions. A negative result may occur with  improper specimen collection/handling, submission of specimen other than nasopharyngeal swab, presence of viral mutation(s) within the areas targeted by this assay, and inadequate number of viral copies(<138 copies/mL). A negative result must be combined with clinical observations, patient history, and epidemiological information. The expected result is Negative.  Fact Sheet for Patients:  EntrepreneurPulse.com.au  Fact Sheet for Healthcare Providers:  IncredibleEmployment.be  This test is no t yet approved or cleared by the [place] FDA and   has been authorized for detection and/or diagnosis of SARS-CoV-2 by FDA under an Emergency Use Authorization (EUA). This EUA will remain  in effect (meaning this test can be used) for the duration of the COVID-19 declaration under Section 564(b)(1) of the Act, 21 U.S.C.section 360bbb-3(b)(1), unless the authorization is terminated  or revoked sooner.       Influenza A by PCR NEGATIVE NEGATIVE Final   Influenza B by PCR NEGATIVE NEGATIVE Final    Comment: (NOTE) The Xpert Xpress SARS-CoV-2/FLU/RSV plus assay is intended as an aid in the diagnosis of influenza from Nasopharyngeal swab specimens and should not be used as a sole basis for treatment. Nasal washings and aspirates are unacceptable for Xpert Xpress SARS-CoV-2/FLU/RSV testing.  Fact Sheet for Patients: EntrepreneurPulse.com.au  Fact Sheet for Healthcare Providers: IncredibleEmployment.be  This test is not yet approved or cleared by the [place] FDA and has been authorized for detection and/or diagnosis of SARS-CoV-2 by FDA under an Emergency Use Authorization (EUA). This EUA will remain in effect (meaning this test can be used) for the duration of the COVID-19 declaration under Section 564(b)(1) of the Act, 21 U.S.C. section 360bbb-3(b)(1), unless the authorization is terminated or revoked.  Performed at [facility], [address] [place], [place] [address]      Patient was seen and examined on the day of discharge and was found to be in stable condition. Time coordinating discharge: 25 minutes including assessment and coordination of care, as well as examination of the patient.   SIGNED:  Dessa Phi, DO [facility] 07/22/2020, 11:51 AM

## 2020-07-22 NOTE — Plan of Care (Signed)

## 2020-07-23 MED FILL — Verapamil HCl IV Soln 2.5 MG/ML: INTRAVENOUS | Qty: 2 | Status: AC

## 2020-09-03 ENCOUNTER — Emergency Department (HOSPITAL_BASED_OUTPATIENT_CLINIC_OR_DEPARTMENT_OTHER): Payer: Medicare Other

## 2020-09-03 ENCOUNTER — Emergency Department (HOSPITAL_BASED_OUTPATIENT_CLINIC_OR_DEPARTMENT_OTHER)
Admission: EM | Admit: 2020-09-03 | Discharge: 2020-09-03 | Disposition: A | Discharge status: Home / Self Care | Payer: Medicare Other | Attending: Emergency Medicine | Admitting: Emergency Medicine

## 2020-09-03 ENCOUNTER — Other Ambulatory Visit: Payer: Self-pay

## 2020-09-03 ENCOUNTER — Encounter (HOSPITAL_BASED_OUTPATIENT_CLINIC_OR_DEPARTMENT_OTHER): Payer: Self-pay

## 2020-09-03 DIAGNOSIS — I25110 Atherosclerotic heart disease of native coronary artery with unstable angina pectoris: Secondary | ICD-10-CM | POA: Insufficient documentation

## 2020-09-03 DIAGNOSIS — Z955 Presence of coronary angioplasty implant and graft: Secondary | ICD-10-CM | POA: Diagnosis not present

## 2020-09-03 DIAGNOSIS — I5042 Chronic combined systolic (congestive) and diastolic (congestive) heart failure: Secondary | ICD-10-CM | POA: Diagnosis not present

## 2020-09-03 DIAGNOSIS — E1143 Type 2 diabetes mellitus with diabetic autonomic (poly)neuropathy: Secondary | ICD-10-CM | POA: Insufficient documentation

## 2020-09-03 DIAGNOSIS — S0990XA Unspecified injury of head, initial encounter: Secondary | ICD-10-CM | POA: Insufficient documentation

## 2020-09-03 DIAGNOSIS — S01511A Laceration without foreign body of lip, initial encounter: Secondary | ICD-10-CM | POA: Insufficient documentation

## 2020-09-03 DIAGNOSIS — W01198A Fall on same level from slipping, tripping and stumbling with subsequent striking against other object, initial encounter: Secondary | ICD-10-CM | POA: Insufficient documentation

## 2020-09-03 DIAGNOSIS — R0781 Pleurodynia: Secondary | ICD-10-CM | POA: Diagnosis not present

## 2020-09-03 DIAGNOSIS — Z8546 Personal history of malignant neoplasm of prostate: Secondary | ICD-10-CM | POA: Insufficient documentation

## 2020-09-03 DIAGNOSIS — Z7984 Long term (current) use of oral hypoglycemic drugs: Secondary | ICD-10-CM | POA: Diagnosis not present

## 2020-09-03 DIAGNOSIS — I110 Hypertensive heart disease with heart failure: Secondary | ICD-10-CM | POA: Diagnosis not present

## 2020-09-03 DIAGNOSIS — Z7982 Long term (current) use of aspirin: Secondary | ICD-10-CM | POA: Diagnosis not present

## 2020-09-03 DIAGNOSIS — Z79899 Other long term (current) drug therapy: Secondary | ICD-10-CM | POA: Insufficient documentation

## 2020-09-03 DIAGNOSIS — E1140 Type 2 diabetes mellitus with diabetic neuropathy, unspecified: Secondary | ICD-10-CM | POA: Diagnosis not present

## 2020-09-03 DIAGNOSIS — Z794 Long term (current) use of insulin: Secondary | ICD-10-CM | POA: Insufficient documentation

## 2020-09-03 DIAGNOSIS — S0993XA Unspecified injury of face, initial encounter: Secondary | ICD-10-CM | POA: Diagnosis present

## 2020-09-03 DIAGNOSIS — Y92009 Unspecified place in unspecified non-institutional (private) residence as the place of occurrence of the external cause: Secondary | ICD-10-CM | POA: Diagnosis not present

## 2020-09-03 DIAGNOSIS — Z87891 Personal history of nicotine dependence: Secondary | ICD-10-CM | POA: Diagnosis not present

## 2020-09-03 DIAGNOSIS — Z950 Presence of cardiac pacemaker: Secondary | ICD-10-CM | POA: Diagnosis not present

## 2020-09-03 MED ORDER — AMOXICILLIN-POT CLAVULANATE 875-125 MG PO TABS: 1.0000 | ORAL_TABLET | Freq: Two times a day (BID) | ORAL | 0 refills | Status: DC

## 2020-09-03 MED ORDER — TETANUS-DIPHTH-ACELL PERTUSSIS 5-2.5-18.5 LF-MCG/0.5 IM SUSY: 0.5000 mL | PREFILLED_SYRINGE | Freq: Once | INTRAMUSCULAR | Status: DC

## 2020-09-03 MED ORDER — TRANEXAMIC ACID 1000 MG/10ML IV SOLN
500.0000 mg | Freq: Once | INTRAVENOUS | Status: DC
  Filled 2020-09-03: qty 10

## 2020-09-03 MED ORDER — LIDOCAINE HCL (PF) 1 % IJ SOLN
30.0000 mL | Freq: Once | INTRAMUSCULAR | Status: AC
  Administered 2020-09-03: 20 mL via INTRADERMAL
  Filled 2020-09-03: qty 30

## 2020-09-03 NOTE — ED Provider Notes (Signed)
[facility] EMERGENCY DEPARTMENT Provider Note   CSN: [id number] Arrival date & time: 09/03/20  1718     History Chief Complaint  Patient presents with   Johnny Ochoa. is a 75 y.o. male.   Patient presents with facial trauma.  He fell hitting his lower lip on the concrete when working at the [employer] earlier today.  Patient is on Plavix.  He denies any nausea, headache, vomiting.  States he did not lose consciousness.  He is bleeding through the lower lip and has the most pain there.  He broke his dentures during the fall.  He is also having pain under the right ribs.  This is worse with inspiration.  It is also tender to palpation.    Past Medical History:  Diagnosis Date   Cancer [facility])    prostate  (Radiation only)   CHF (congestive heart failure) ([place])    Diabetes mellitus    Hypertension    Kidney stones    Leg swelling    Low back pain    Myocardial infarction ([place]) 12/09/1993   Obese    OSA (obstructive sleep apnea)    Peripheral neuropathy    PTSD (post-traumatic stress disorder)     Patient Active Problem List   Diagnosis Date Noted   Chronic combined systolic and diastolic heart failure ([place])    Acute gout 08/16/2018   Anesthesia complication 66/07/3014   Hearing loss 08/02/2018   GERD (gastroesophageal reflux disease) 08/02/2018   Osteoarthritis of left knee 01/31/2018   Blood in stool 10/22/2017   Liver cirrhosis ([place]) 10/22/2017   GI bleed 10/22/2017   Anxiety 10/28/2016   Arthritis 10/28/2016   Bradycardia 10/28/2016   Degeneration of lumbar intervertebral disc 10/28/2016   Irregular heart rate 10/28/2016   Male stress incontinence 10/28/2016   Myalgia 10/28/2016   Neck pain 10/28/2016   Occipital neuralgia 10/28/2016   Pain in left knee 10/28/2016   Peripheral neuropathy 10/28/2016   Diabetic neuropathy ([place]) 02/26/2016   Essential hypertension 02/26/2016   CAD in native artery 02/26/2016   Hyperlipidemia  02/26/2016   Elevated troponin 02/26/2016   Morbid obesity ([place]) 02/26/2016   Depression with anxiety 02/26/2016   Hypokalemia 02/26/2016   Cardiac pacemaker in situ 02/26/2016   CAP (community acquired pneumonia) 02/25/2016   Multifocal pneumonia 02/25/2016   Acute respiratory failure with hypoxia ([place]) 02/25/2016   Ischemic cardiomyopathy 10/04/2015   Diabetic gastroparesis associated with type 2 diabetes mellitus ([place]) 07/18/2015   Unstable angina ([place]) 04/19/2015   Cardiomyopathy ([place]) 03/27/2015   Coronary artery disease involving native coronary artery of native heart with unstable angina pectoris ([place]) 03/27/2015   OSA on CPAP 03/27/2015   Shortness of breath 03/27/2015   Acute on chronic systolic CHF (congestive heart failure) ([place]) 06/23/2014   Uncontrolled diabetes mellitus with complications ([place]) 02/24/3233   Anemia 11/30/2013   Other autoimmune hemolytic anemias 11/30/2013    Past Surgical History:  Procedure Laterality Date   CARDIAC DEFIBRILLATOR PLACEMENT     COLONOSCOPY WITH PROPOFOL N/A 10/26/2017   Procedure: COLONOSCOPY WITH PROPOFOL;  Surgeon: Wonda Horner, MD;  Location: [facility] ENDOSCOPY;  Service: Endoscopy;  Laterality: N/A;   CORONARY ANGIOPLASTY WITH STENT PLACEMENT  1995    x 3 stents   INTRAVASCULAR PRESSURE WIRE/FFR STUDY N/A 07/22/2020   Procedure: INTRAVASCULAR PRESSURE WIRE/FFR STUDY;  Surgeon: Nelva Bush, MD;  Location: [facility] CV LAB;  Service: Cardiovascular;  Laterality: N/A;  CFX and  RCA    JOINT REPLACEMENT  2004   right total knee replacement   LEFT HEART CATH AND CORONARY ANGIOGRAPHY N/A 07/22/2020   Procedure: LEFT HEART CATH AND CORONARY ANGIOGRAPHY;  Surgeon: Nelva Bush, MD;  Location: [facility] CV LAB;  Service: Cardiovascular;  Laterality: N/A;   MULTIPLE TOOTH EXTRACTIONS     Total Tooth extraction   PACEMAKER INSERTION     PACEMAKER PLACEMENT         Family History  Problem Relation Age of Onset   Heart attack  Mother    Hypertension Mother    Diabetes Other    Hypertension Other    Heart disease Other    Arthritis Other    Heart disease Sister    Heart disease Brother     Social History   Tobacco Use   Smoking status: Former    Packs/day: 1.00    Years: 20.00    Pack years: 20.00    Types: Cigarettes    Quit date: 10/23/1980    Years since quitting: 39.8   Smokeless tobacco: Never  Vaping Use   Vaping Use: Never used  Substance Use Topics   Alcohol use: No    Alcohol/week: 0.0 standard drinks   Drug use: No    Home Medications Prior to Admission medications   Medication Sig Start Date End Date Taking? Authorizing Provider  acetaminophen (TYLENOL) 325 MG tablet Take 650 mg by mouth 3 (three) times daily as needed (pain).     [provider]  albuterol (PROVENTIL HFA;VENTOLIN HFA) 108 (90 BASE) MCG/ACT inhaler Inhale 2 puffs into the lungs every 4 (four) hours as needed for wheezing or shortness of breath. 08/24/13   Orpah Greek, MD  ascorbic acid (VITAMIN C) 500 MG tablet Take 500 mg by mouth daily. 11/30/19   [provider]  aspirin EC 81 MG tablet Take 81 mg by mouth daily.    [provider]  atorvastatin (LIPITOR) 80 MG tablet Take 40 mg by mouth at bedtime.     [provider]  Cholecalciferol 50 MCG (2000 UT) TABS Take 2,000 Units by mouth daily. 11/30/19   [provider]  clopidogrel (PLAVIX) 75 MG tablet Take 75 mg by mouth daily. 02/16/16   [provider]  colchicine 0.6 MG tablet TAKE 1 TABLET BY MOUTH DAILY Patient taking differently: Take 0.6 mg by mouth daily. TAKE 1 TABLET BY MOUTH DAILY 04/27/20   Evelina Bucy, DPM  COVID-19 mRNA Vac-TriS, Pfizer, (PFIZER-BIONT COVID-19 VAC-TRIS) SUSP injection Inject into the muscle. 07/18/20   Carlyle Basques, MD  dapagliflozin propanediol (FARXIGA) 10 MG TABS tablet Take 1 tablet (10 mg total) by mouth daily before breakfast. 07/22/20   Dessa Phi, DO  diclofenac  Sodium (VOLTAREN) 1 % GEL Apply 2 g topically every 4 (four) hours as needed (back pain). 11/29/18   [provider]  docusate sodium (COLACE) 100 MG capsule Take 100 mg by mouth 2 (two) times daily.    [provider]  DULoxetine (CYMBALTA) 60 MG capsule Take 60 mg by mouth daily.     [provider]  Ferrous Sulfate (IRON) 325 (65 Fe) MG TABS Take 325 mg by mouth daily.    [provider]  furosemide (LASIX) 40 MG tablet Take 1 tablet (40 mg total) by mouth daily. Take an extra dose if you gain 2 lbs in 24 hours. Patient taking differently: Take 40 mg by mouth See admin instructions. Take one tablet (40 mg)  by mouth every morning, take an extra dose if you gain 2 lbs in 24 hours. 02/28/16   Rama, Venetia Maxon, MD  gabapentin (NEURONTIN) 400 MG capsule Take 400 mg by mouth 3 (three) times daily.     [provider]  indomethacin (INDOCIN) 50 MG capsule TAKE 1 CAPSULE(50 MG) BY MOUTH TWICE DAILY WITH A MEAL Patient taking differently: Take 50 mg by mouth 2 (two) times daily with a meal. 08/02/18   Hyatt, Max T, DPM  insulin aspart (NOVOLOG) 100 UNIT/ML FlexPen Inject 18 Units into the skin 2 (two) times daily with a meal.    [provider]  Insulin Glargine (LANTUS) 100 UNIT/ML Solostar Pen Inject 18 Units into the skin 2 (two) times daily.    [provider]  isosorbide mononitrate (IMDUR) 60 MG 24 hr tablet Take 1 tablet (60 mg total) by mouth daily. 07/22/20 10/20/20  Dessa Phi, DO  Magnesium 400 MG TABS Take 400 mg by mouth daily.    [provider]  metoprolol (TOPROL-XL) 200 MG 24 hr tablet Take 100 mg by mouth daily. Take with or immediately following a meal.    [provider]  miconazole (MICOTIN) 2 % powder Apply 1 application topically daily as needed for itching. 04/03/20   [provider]  mirabegron ER (MYRBETRIQ) 25 MG TB24 tablet Take 25 mg by mouth daily. 04/24/20   [provider]   Multiple Vitamin (MULTIVITAMIN WITH MINERALS) TABS tablet Take 1 tablet by mouth daily.    [provider]  nitroGLYCERIN (NITROSTAT) 0.4 MG SL tablet Place 0.4 mg under the tongue every 5 (five) minutes as needed for chest pain.     [provider]  omeprazole (PRILOSEC) 20 MG capsule Take 40 mg by mouth daily.     [provider]  OZEMPIC, 1 MG/DOSE, 4 MG/3ML SOPN Inject 1 mg as directed once a week. 04/02/20   [provider]  polyethylene glycol (MIRALAX / GLYCOLAX) packet Take 17 g by mouth daily as needed (constipation).     [provider]  potassium chloride (K-DUR,KLOR-CON) 10 MEQ tablet Take 10 mEq by mouth daily.    [provider]  primidone (MYSOLINE) 50 MG tablet Take 50 mg by mouth 2 (two) times daily.     [provider]  sacubitril-valsartan (ENTRESTO) 49-51 MG Take 1 tablet 2 (two) times daily by mouth.    [provider]  spironolactone (ALDACTONE) 25 MG tablet Take 25 mg by mouth daily. 02/16/16   [provider]  tamsulosin (FLOMAX) 0.4 MG CAPS capsule Take 0.8 mg by mouth at bedtime.     [provider]  terbinafine (LAMISIL) 1 % cream Apply 1 application topically daily as needed for itching.    [provider]  traZODone (DESYREL) 100 MG tablet Take 100 mg by mouth at bedtime.    [provider]    Allergies    Metformin  Review of Systems   Review of Systems  HENT:         Lip laceration  Respiratory:  Negative for shortness of breath.   Cardiovascular:  Negative for chest pain.  Gastrointestinal:  Negative for nausea and vomiting.  Musculoskeletal:        Rib pain  Neurological:  Negative for syncope and headaches.   Physical Exam Updated Vital Signs BP 115/70 (BP Location: Left Arm)   Pulse 75   Temp 97.7 F (36.5 C)   Resp 20   Ht 5'  6" (1.676 m)   Wt 115.7 kg   SpO2 98%   BMI 41.16 kg/m   Physical Exam Vitals and nursing note reviewed.  Exam conducted with a chaperone present.  Constitutional:      Appearance: Normal appearance. He is obese.  HENT:     Head: Normocephalic and atraumatic.     Mouth/Throat:     Comments: Patient with laceration to lower lip. Eyes:     General: No scleral icterus.       Right eye: No discharge.        Left eye: No discharge.     Extraocular Movements: Extraocular movements intact.     Pupils: Pupils are equal, round, and reactive to light.  Cardiovascular:     Rate and Rhythm: Normal rate and regular rhythm.     Pulses: Normal pulses.     Heart sounds: Normal heart sounds. No murmur heard.   No friction rub. No gallop.     Comments: Tenderness to right lower ribs. Pulmonary:     Effort: Pulmonary effort is normal. No respiratory distress.     Breath sounds: Normal breath sounds.  Abdominal:     General: Abdomen is flat. Bowel sounds are normal. There is no distension.     Palpations: Abdomen is soft.     Tenderness: There is no abdominal tenderness.  Skin:    General: Skin is warm and dry.     Coloration: Skin is not jaundiced.  Neurological:     Mental Status: He is alert. Mental status is at baseline.     Coordination: Coordination normal.    ED Results / Procedures / Treatments   Labs (all labs ordered are listed, but only abnormal results are displayed) Labs Reviewed - No data to display  EKG None  Radiology No results found.  Procedures .Marland KitchenLaceration Repair  Date/Time: 09/03/2020 7:13 PM Performed by: Sherrill Raring, PA-C Authorized by: Sherrill Raring, PA-C   Consent:    Consent obtained:  Verbal   Risks discussed:  Infection, pain, need for additional repair, poor cosmetic result and poor wound healing   Alternatives discussed:  No treatment, delayed treatment and observation Laceration details:    Location:  Lip   Lip location:  Lower exterior lip   Length (cm):  2   Depth (mm):  5 Pre-procedure details:    Preparation:  Patient was prepped and draped in  usual sterile fashion Treatment:    Area cleansed with:  Saline   Amount of cleaning:  Standard   Irrigation solution:  Sterile saline   Irrigation method:  Pressure wash   Visualized foreign bodies/material removed: no     Debridement:  None   Undermining:  None   Scar revision: no   Skin repair:    Repair method:  Sutures   Suture size:  6-0   Suture material:  Chromic gut   Suture technique:  Simple interrupted   Number of sutures:  5 Approximation:    Approximation:  Close   Vermilion border well-aligned: no   Repair type:    Repair type:  Simple Post-procedure details:    Dressing:  Open (no dressing)   Procedure completion:  Tolerated well, no immediate complications   Medications Ordered in ED Medications - No data to display  ED Course  I have reviewed the triage vital signs and the nursing notes.  Pertinent labs & imaging results that were available during my care of the patient were reviewed by me and considered  in my medical decision making (see chart for details).  Clinical Course as of 09/03/20 2358  Tue Sep 03, 2020  2039 CT Head Wo Contrast No signs of intracranial bleed [HS]  2039 DG Chest 2 View [HS]  2040 DG Chest 2 View No signs of rib fracture [HS]    Clinical Course User Index [HS] Sherrill Raring, PA-C   MDM Rules/Calculators/A&P                           Patient is stable vitals, there is an obvious laceration to the lower lip.  He did have a fall on Plavix, so although he does not have any headache, no focal deficits on neuro exam I will evaluate for possible bleed to the CT scan.  The laceration to the lip does not go all the way through, will do a nerve block and repair with dissolvable sutures.  Discussed risks and benefits with the patient who agrees to go ahead with a lack repair.  We will also get a radiograph to assess for possible rib fracture given he is having pain on the right side of his ribs that is reproducible with pressure.  Also  need to discharge him with antibiotics to cover oral flora. Tdap was boosted last month so not needed at this time.   Lack repair was successful.  Attending physician did the dental block which was also successful.  Patient is doing well, bleeding is controlled.  We will discharge him with outpatient Augmentin and follow-up.  Return precautions discussed.  Bleeding is controlled, his vitals are stable and I think he is appropriate for discharge at this time. Final Clinical Impression(s) / ED Diagnoses Final diagnoses:  None    Rx / DC Orders ED Discharge Orders     None        Sherrill Raring, PA-C 09/03/20 [place], [place], DO 09/04/20 1522

## 2020-09-03 NOTE — ED Triage Notes (Addendum)
Pt lost his balance while setting up the grill at home/fell-struck mouth on cement-lac to bottom lip and to right rib area-denies LOC-NAD-to triage in w/c

## 2020-09-03 NOTE — ED Provider Notes (Signed)
I personally evaluated the patient during the encounter and completed a history, physical, procedures, medical decision making to contribute to the overall care of the patient and decision making for the patient briefly, the patient is a 75 y.o. male presents the ED after mechanical fall.  Laceration to his lower lip.  Patient lost his balance and fell.  Was not using his cane.  Did not have loss of consciousness.  He is on Plavix.  Broke his dentures on his fall cutting his lower lip.  He has overall superficial laceration to the thickness of his lower lip on the left side.  Avulsion type injury.  Does not involve the vermilion border.  Does not go through and through.  Overall is fairly superficial but given the length and some bleeding was repaired with sutures.  CT image of the head was unremarkable as well as chest x-ray.  Overall he actually is expected to get new dentures next week already.  We will have him follow-up with his primary care doctor/dentistry about dentures.  Will place on antibiotics prophylactically given lip laceration.  This was extensively cleaned out.  His tetanus shot is already up-to-date.  He understands return precautions including if any type of infectious process happens.  This chart was dictated using voice recognition software.  Despite best efforts to proofread,  errors can occur which can change the documentation meaning.    EKG Interpretation None             Lennice Sites, DO 09/03/20 1923

## 2020-09-03 NOTE — Discharge Instructions (Addendum)
Please take Augmentin twice daily for the next 7 days.  This is an antibiotic that will help prevent any infection. Continue to apply the lubricant to the mouth daily as this will help with the clot. The clot may dislodge, but do not pull it off yourself.  If it dislodges, apply gentle pressure until another clot forms. The sutures applied to your mouth will dissolve on their own with time.  You do not need to come back to have them removed. Eat soft foods for the next 24 hours.  After that the clots form on the inside of the mouth.  Continue to be gentle with soft foods as you are healing. Follow-up with your primary care doctor in the next week to confirm appropriate healing.  If things worsen or change please return back to the ED for further evaluation.

## 2020-09-09 ENCOUNTER — Other Ambulatory Visit (HOSPITAL_COMMUNITY): Payer: Self-pay | Admitting: Neurology

## 2020-09-09 DIAGNOSIS — G250 Essential tremor: Secondary | ICD-10-CM

## 2020-10-15 ENCOUNTER — Ambulatory Visit (HOSPITAL_COMMUNITY)
Admission: RE | Admit: 2020-10-15 | Discharge: 2020-10-15 | Disposition: A | Discharge status: Home / Self Care | Payer: No Typology Code available for payment source | Source: Ambulatory Visit | Attending: Neurology | Admitting: Neurology

## 2020-10-15 ENCOUNTER — Encounter (HOSPITAL_COMMUNITY)
Admission: RE | Admit: 2020-10-15 | Discharge: 2020-10-15 | Disposition: A | Discharge status: Home / Self Care | Payer: No Typology Code available for payment source | Source: Ambulatory Visit | Attending: Neurology | Admitting: Neurology

## 2020-10-15 ENCOUNTER — Other Ambulatory Visit: Payer: Self-pay

## 2020-10-15 DIAGNOSIS — G250 Essential tremor: Secondary | ICD-10-CM | POA: Diagnosis present

## 2020-10-15 MED ORDER — POTASSIUM IODIDE (ANTIDOTE) 130 MG PO TABS
ORAL_TABLET | ORAL | Status: AC
  Administered 2020-10-15: 130 mg via ORAL
  Filled 2020-10-15: qty 1

## 2020-10-15 MED ORDER — IOFLUPANE I 123 185 MBQ/2.5ML IV SOLN
4.9800 | Freq: Once | INTRAVENOUS | Status: AC
  Administered 2020-10-15: 4.98 via INTRAVENOUS

## 2020-10-15 MED ORDER — POTASSIUM IODIDE (ANTIDOTE) 130 MG PO TABS: 130.0000 mg | ORAL_TABLET | Freq: Once | ORAL | Status: AC

## 2021-02-13 ENCOUNTER — Other Ambulatory Visit (HOSPITAL_COMMUNITY): Payer: Self-pay

## 2021-02-13 MED ORDER — OZEMPIC (2 MG/DOSE) 8 MG/3ML ~~LOC~~ SOPN
PEN_INJECTOR | SUBCUTANEOUS | 5 refills | Status: DC
  Filled 2021-02-13: qty 3, 28d supply, fill #0
  Filled 2021-03-23: qty 3, 28d supply, fill #1
  Filled 2021-04-15: qty 3, 28d supply, fill #2
  Filled 2021-05-19: qty 3, 28d supply, fill #3

## 2021-02-25 ENCOUNTER — Other Ambulatory Visit (HOSPITAL_COMMUNITY): Payer: Self-pay

## 2021-03-24 ENCOUNTER — Other Ambulatory Visit (HOSPITAL_COMMUNITY): Payer: Self-pay

## 2021-04-01 ENCOUNTER — Other Ambulatory Visit (HOSPITAL_COMMUNITY): Payer: Self-pay

## 2021-04-15 ENCOUNTER — Other Ambulatory Visit (HOSPITAL_COMMUNITY): Payer: Self-pay

## 2021-04-15 ENCOUNTER — Other Ambulatory Visit (HOSPITAL_BASED_OUTPATIENT_CLINIC_OR_DEPARTMENT_OTHER): Payer: Self-pay

## 2021-04-15 MED ORDER — OZEMPIC (2 MG/DOSE) 8 MG/3ML ~~LOC~~ SOPN
PEN_INJECTOR | SUBCUTANEOUS | 3 refills | Status: DC
  Filled 2021-04-15: qty 3, 28d supply, fill #0

## 2021-04-17 ENCOUNTER — Other Ambulatory Visit (HOSPITAL_COMMUNITY): Payer: Self-pay

## 2021-05-07 ENCOUNTER — Other Ambulatory Visit (HOSPITAL_COMMUNITY): Payer: Self-pay

## 2021-05-09 ENCOUNTER — Encounter (HOSPITAL_BASED_OUTPATIENT_CLINIC_OR_DEPARTMENT_OTHER): Payer: Self-pay | Admitting: Emergency Medicine

## 2021-05-09 ENCOUNTER — Emergency Department (HOSPITAL_BASED_OUTPATIENT_CLINIC_OR_DEPARTMENT_OTHER)
Admission: EM | Admit: 2021-05-09 | Discharge: 2021-05-09 | Disposition: A | Discharge status: Home / Self Care | Payer: Medicare Other | Attending: Emergency Medicine | Admitting: Emergency Medicine

## 2021-05-09 ENCOUNTER — Other Ambulatory Visit: Payer: Self-pay

## 2021-05-09 ENCOUNTER — Emergency Department (HOSPITAL_BASED_OUTPATIENT_CLINIC_OR_DEPARTMENT_OTHER): Payer: Medicare Other

## 2021-05-09 DIAGNOSIS — E119 Type 2 diabetes mellitus without complications: Secondary | ICD-10-CM | POA: Insufficient documentation

## 2021-05-09 DIAGNOSIS — I10 Essential (primary) hypertension: Secondary | ICD-10-CM | POA: Diagnosis not present

## 2021-05-09 DIAGNOSIS — W19XXXA Unspecified fall, initial encounter: Secondary | ICD-10-CM | POA: Diagnosis not present

## 2021-05-09 DIAGNOSIS — I110 Hypertensive heart disease with heart failure: Secondary | ICD-10-CM | POA: Insufficient documentation

## 2021-05-09 DIAGNOSIS — Z7901 Long term (current) use of anticoagulants: Secondary | ICD-10-CM | POA: Insufficient documentation

## 2021-05-09 DIAGNOSIS — Z8546 Personal history of malignant neoplasm of prostate: Secondary | ICD-10-CM | POA: Insufficient documentation

## 2021-05-09 DIAGNOSIS — Z79899 Other long term (current) drug therapy: Secondary | ICD-10-CM | POA: Insufficient documentation

## 2021-05-09 DIAGNOSIS — I509 Heart failure, unspecified: Secondary | ICD-10-CM | POA: Diagnosis not present

## 2021-05-09 DIAGNOSIS — Z96651 Presence of right artificial knee joint: Secondary | ICD-10-CM | POA: Insufficient documentation

## 2021-05-09 DIAGNOSIS — Z950 Presence of cardiac pacemaker: Secondary | ICD-10-CM | POA: Insufficient documentation

## 2021-05-09 DIAGNOSIS — R600 Localized edema: Secondary | ICD-10-CM | POA: Diagnosis not present

## 2021-05-09 DIAGNOSIS — Z7982 Long term (current) use of aspirin: Secondary | ICD-10-CM | POA: Diagnosis not present

## 2021-05-09 DIAGNOSIS — M109 Gout, unspecified: Secondary | ICD-10-CM | POA: Insufficient documentation

## 2021-05-09 DIAGNOSIS — M7989 Other specified soft tissue disorders: Secondary | ICD-10-CM | POA: Diagnosis present

## 2021-05-09 DIAGNOSIS — Z794 Long term (current) use of insulin: Secondary | ICD-10-CM | POA: Diagnosis not present

## 2021-05-09 DIAGNOSIS — Z955 Presence of coronary angioplasty implant and graft: Secondary | ICD-10-CM | POA: Insufficient documentation

## 2021-05-09 LAB — CBC WITH DIFFERENTIAL/PLATELET
Abs Immature Granulocytes: 0.01 10*3/uL (ref 0.00–0.07)
Basophils Absolute: 0 10*3/uL (ref 0.0–0.1)
Basophils Relative: 1 %
Eosinophils Absolute: 0.4 10*3/uL (ref 0.0–0.5)
Eosinophils Relative: 6 %
HCT: 39.3 % (ref 39.0–52.0)
Hemoglobin: 13 g/dL (ref 13.0–17.0)
Immature Granulocytes: 0 %
Lymphocytes Relative: 26 %
Lymphs Abs: 1.4 10*3/uL (ref 0.7–4.0)
MCH: 32.3 pg (ref 26.0–34.0)
MCHC: 33.1 g/dL (ref 30.0–36.0)
MCV: 97.8 fL (ref 80.0–100.0)
Monocytes Absolute: 0.6 10*3/uL (ref 0.1–1.0)
Monocytes Relative: 11 %
Neutro Abs: 3.1 10*3/uL (ref 1.7–7.7)
Neutrophils Relative %: 56 %
Platelets: 193 10*3/uL (ref 150–400)
RBC: 4.02 MIL/uL — ABNORMAL LOW (ref 4.22–5.81)
RDW: 14.9 % (ref 11.5–15.5)
WBC: 5.5 10*3/uL (ref 4.0–10.5)
nRBC: 0 % (ref 0.0–0.2)

## 2021-05-09 LAB — BASIC METABOLIC PANEL
Anion gap: 8 (ref 5–15)
BUN: 15 mg/dL (ref 8–23)
CO2: 29 mmol/L (ref 22–32)
Calcium: 9 mg/dL (ref 8.9–10.3)
Chloride: 100 mmol/L (ref 98–111)
Creatinine, Ser: 1.27 mg/dL — ABNORMAL HIGH (ref 0.61–1.24)
GFR, Estimated: 59 mL/min — ABNORMAL LOW (ref >60)
Glucose, Bld: 95 mg/dL (ref 70–99)
Potassium: 4.9 mmol/L (ref 3.5–5.1)
Sodium: 137 mmol/L (ref 135–145)

## 2021-05-09 MED ORDER — KETOROLAC TROMETHAMINE 15 MG/ML IJ SOLN
30.0000 mg | Freq: Once | INTRAMUSCULAR | Status: AC
  Administered 2021-05-09: 30 mg via INTRAMUSCULAR
  Filled 2021-05-09: qty 2

## 2021-05-09 MED ORDER — INDOMETHACIN 50 MG PO CAPS: 50.0000 mg | ORAL_CAPSULE | Freq: Two times a day (BID) | ORAL | 0 refills | Status: AC

## 2021-05-09 MED ORDER — KETOROLAC TROMETHAMINE 15 MG/ML IJ SOLN
15.0000 mg | Freq: Once | INTRAMUSCULAR | Status: DC
  Filled 2021-05-09: qty 1

## 2021-05-09 NOTE — ED Triage Notes (Signed)
C/o L foot pain and swelling x 2 weeks. States he fell 2 days ago and c/o R hip pain.  ?

## 2021-05-09 NOTE — ED Notes (Signed)
Pt wheeled to waiting room. Pt verbalized understanding of discharge instructions.   

## 2021-05-09 NOTE — ED Provider Notes (Signed)
?[facility] EMERGENCY DEPARTMENT ?Provider Note ? ? ?CSN: [id number] ?Arrival date & time: 05/09/21  1030 ? ?  ? ?History ? ?Chief Complaint  ?Patient presents with  ? Foot Swelling  ? Fall  ? ? ?Johnny Ochoa. is a 76 y.o. male. ? ?This is a 76 y.o. male  with significant medical history as below, including gout, htn, hld who presents to the ED with complaint of left foot pain/swelling. ? ?Location:  left foot, hallux ?Duration:  2 wks ?Onset:  gradual ?Timing:  constant ?Description:  aching, throbbing ?Severity:  mild ?Exacerbating/Alleviating Factors:  worse w/ ambulation, palpation ?Associated Symptoms:  he did have a fall a few days ago, stumbled over object on the ground and hit his right backside on the flood, no LOC no numbness or tingling, he has been ambulatory since the fall. Pain to left foot similar to prior gout episodes.  ?Pertinent Negatives:  no fevers or chills, no numbness or tingling, no saddle paresthesias, no urination changes, no dib. ? ? ? ?Past Medical History: ?No date: Cancer [facility]) ?    Comment:  prostate  (Radiation only) ?No date: CHF (congestive heart failure) ([place]) ?No date: Diabetes mellitus ?No date: Hypertension ?No date: Kidney stones ?No date: Leg swelling ?No date: Low back pain ?12/09/1993: Myocardial infarction [facility]) ?No date: Obese ?No date: OSA (obstructive sleep apnea) ?No date: Peripheral neuropathy ?No date: PTSD (post-traumatic stress disorder) ? ?Past Surgical History: ?No date: CARDIAC DEFIBRILLATOR PLACEMENT ?10/26/2017: COLONOSCOPY WITH PROPOFOL; N/A ?    Comment:  Procedure: COLONOSCOPY WITH PROPOFOL;  Surgeon: Penelope Coop,  ?             Kenney Houseman, MD;  Location: [facility] ENDOSCOPY;  Service:  ?             Endoscopy;  Laterality: N/A; ?1995: CORONARY ANGIOPLASTY WITH STENT PLACEMENT ?    Comment:   x 3 stents ?07/22/2020: INTRAVASCULAR PRESSURE WIRE/FFR STUDY; N/A ?    Comment:  Procedure: INTRAVASCULAR PRESSURE WIRE/FFR STUDY;   ?             Surgeon: Nelva Bush, MD;  Location: [place] CV  ?             LAB;  Service: Cardiovascular;  Laterality: N/A;  CFX and ?             RCA ?2004: JOINT REPLACEMENT ?    Comment:  right total knee replacement ?07/22/2020: LEFT HEART CATH AND CORONARY ANGIOGRAPHY; N/A ?    Comment:  Procedure: LEFT HEART CATH AND CORONARY ANGIOGRAPHY;   ?             Surgeon: Nelva Bush, MD;  Location: [place] CV  ?             LAB;  Service: Cardiovascular;  Laterality: N/A; ?No date: MULTIPLE TOOTH EXTRACTIONS ?    Comment:  Total Tooth extraction ?No date: PACEMAKER INSERTION ?No date: PACEMAKER PLACEMENT  ? ? ?The history is provided by the patient. No language interpreter was used.  ?Fall ?Pertinent negatives include no chest pain, no abdominal pain, no headaches and no shortness of breath.  ? ?  ? ?Home Medications ?Prior to Admission medications   ?Medication Sig Start Date End Date Taking? Authorizing Provider  ?indomethacin (INDOCIN) 50 MG capsule Take 1 capsule (50 mg total) by mouth 2 (two) times daily with a meal for 5 days. Initial, 50 mg orally 2 times daily until pain is  tolerable; rapidly taper based on pain response to discontinuation 05/09/21 05/14/21 Yes Wynona Dove A, DO  ?acetaminophen (TYLENOL) 325 MG tablet Take 650 mg by mouth 3 (three) times daily as needed (pain).     [provider]  ?albuterol (PROVENTIL HFA;VENTOLIN HFA) 108 (90 BASE) MCG/ACT inhaler Inhale 2 puffs into the lungs every 4 (four) hours as needed for wheezing or shortness of breath. 08/24/13   Orpah Greek, MD  ?amoxicillin-clavulanate (AUGMENTIN) 875-125 MG tablet Take 1 tablet by mouth every 12 (twelve) hours. 09/03/20   Sherrill Raring, PA-C  ?ascorbic acid (VITAMIN C) 500 MG tablet Take 500 mg by mouth daily. 11/30/19   [provider]  ?aspirin EC 81 MG tablet Take 81 mg by mouth daily.    [provider]  ?atorvastatin (LIPITOR) 80 MG tablet Take 40 mg by mouth at bedtime.     [provider]   ?Cholecalciferol 50 MCG (2000 UT) TABS Take 2,000 Units by mouth daily. 11/30/19   [provider]  ?clopidogrel (PLAVIX) 75 MG tablet Take 75 mg by mouth daily. 02/16/16   [provider]  ?colchicine 0.6 MG tablet TAKE 1 TABLET BY MOUTH DAILY ?Patient taking differently: Take 0.6 mg by mouth daily. TAKE 1 TABLET BY MOUTH DAILY 04/27/20   Evelina Bucy, DPM  ?COVID-19 mRNA Vac-TriS, Pfizer, (PFIZER-BIONT COVID-19 VAC-TRIS) SUSP injection Inject into the muscle. 07/18/20   Carlyle Basques, MD  ?dapagliflozin propanediol (FARXIGA) 10 MG TABS tablet Take 1 tablet (10 mg total) by mouth daily before breakfast. 07/22/20   Dessa Phi, DO  ?diclofenac Sodium (VOLTAREN) 1 % GEL Apply 2 g topically every 4 (four) hours as needed (back pain). 11/29/18   [provider]  ?docusate sodium (COLACE) 100 MG capsule Take 100 mg by mouth 2 (two) times daily.    [provider]  ?DULoxetine (CYMBALTA) 60 MG capsule Take 60 mg by mouth daily.     [provider]  ?Ferrous Sulfate (IRON) 325 (65 Fe) MG TABS Take 325 mg by mouth daily.    [provider]  ?furosemide (LASIX) 40 MG tablet Take 1 tablet (40 mg total) by mouth daily. Take an extra dose if you gain 2 lbs in 24 hours. ?Patient taking differently: Take 40 mg by mouth See admin instructions. Take one tablet (40 mg) by mouth every morning, take an extra dose if you gain 2 lbs in 24 hours. 02/28/16   Rama, Venetia Maxon, MD  ?gabapentin (NEURONTIN) 400 MG capsule Take 400 mg by mouth 3 (three) times daily.     [provider]  ?indomethacin (INDOCIN) 50 MG capsule TAKE 1 CAPSULE(50 MG) BY MOUTH TWICE DAILY WITH A MEAL ?Patient taking differently: Take 50 mg by mouth 2 (two) times daily with a meal. 08/02/18   Hyatt, Max T, DPM  ?insulin aspart (NOVOLOG) 100 UNIT/ML FlexPen Inject 18 Units into the skin 2 (two) times daily with a meal.    [provider]  ?Insulin Glargine (LANTUS) 100 UNIT/ML Solostar Pen  Inject 18 Units into the skin 2 (two) times daily.    [provider]  ?isosorbide mononitrate (IMDUR) 60 MG 24 hr tablet Take 1 tablet (60 mg total) by mouth daily. 07/22/20 10/20/20  Dessa Phi, DO  ?Magnesium 400 MG TABS Take 400 mg by mouth daily.    [provider]  ?metoprolol (TOPROL-XL) 200 MG 24 hr tablet Take 100 mg by mouth daily. Take with or immediately following a meal.  [provider]  ?miconazole (MICOTIN) 2 % powder Apply 1 application topically daily as needed for itching. 04/03/20   [provider]  ?mirabegron ER (MYRBETRIQ) 25 MG TB24 tablet Take 25 mg by mouth daily. 04/24/20   [provider]  ?Multiple Vitamin (MULTIVITAMIN WITH MINERALS) TABS tablet Take 1 tablet by mouth daily.    [provider]  ?nitroGLYCERIN (NITROSTAT) 0.4 MG SL tablet Place 0.4 mg under the tongue every 5 (five) minutes as needed for chest pain.     [provider]  ?omeprazole (PRILOSEC) 20 MG capsule Take 40 mg by mouth daily.     [provider]  ?OZEMPIC, 1 MG/DOSE, 4 MG/3ML SOPN Inject 1 mg as directed once a week. 04/02/20   [provider]  ?polyethylene glycol (MIRALAX / GLYCOLAX) packet Take 17 g by mouth daily as needed (constipation).     [provider]  ?potassium chloride (K-DUR,KLOR-CON) 10 MEQ tablet Take 10 mEq by mouth daily.    [provider]  ?primidone (MYSOLINE) 50 MG tablet Take 50 mg by mouth 2 (two) times daily.     [provider]  ?sacubitril-valsartan (ENTRESTO) 49-51 MG Take 1 tablet 2 (two) times daily by mouth.    [provider]  ?Semaglutide, 2 MG/DOSE, (OZEMPIC, 2 MG/DOSE,) 8 MG/3ML SOPN Inject 2 mg into the skin once a week 02/13/21     ?Semaglutide, 2 MG/DOSE, (OZEMPIC, 2 MG/DOSE,) 8 MG/3ML SOPN Inject '2mg'$  into the skin once a week 04/15/21     ?spironolactone (ALDACTONE) 25 MG tablet Take 25 mg by mouth daily. 02/16/16   [provider]  ?tamsulosin (FLOMAX)  0.4 MG CAPS capsule Take 0.8 mg by mouth at bedtime.     [provider]  ?terbinafine (LAMISIL) 1 % cream Apply 1 application topically daily as needed for itching.    [provider]  ?traZODone (

## 2021-05-09 NOTE — Discharge Instructions (Signed)
It was a pleasure caring for you today in the emergency department. ° °Please return to the emergency department for any worsening or worrisome symptoms. ° ° °

## 2021-05-14 ENCOUNTER — Other Ambulatory Visit (HOSPITAL_COMMUNITY): Payer: Self-pay

## 2021-05-16 ENCOUNTER — Other Ambulatory Visit (HOSPITAL_COMMUNITY): Payer: Self-pay

## 2021-05-19 ENCOUNTER — Other Ambulatory Visit (HOSPITAL_COMMUNITY): Payer: Self-pay

## 2021-05-20 ENCOUNTER — Other Ambulatory Visit (HOSPITAL_COMMUNITY): Payer: Self-pay

## 2021-05-27 ENCOUNTER — Other Ambulatory Visit (HOSPITAL_COMMUNITY): Payer: Self-pay

## 2021-05-27 MED ORDER — OZEMPIC (2 MG/DOSE) 8 MG/3ML ~~LOC~~ SOPN
PEN_INJECTOR | SUBCUTANEOUS | 3 refills | Status: AC
  Filled 2021-05-27: qty 9, 84d supply, fill #0
  Filled 2022-02-06: qty 3, 28d supply, fill #0

## 2021-07-18 ENCOUNTER — Ambulatory Visit: Payer: Self-pay | Admitting: Student

## 2021-07-18 DIAGNOSIS — E118 Type 2 diabetes mellitus with unspecified complications: Secondary | ICD-10-CM

## 2021-07-18 NOTE — Progress Notes (Addendum)
COVID Vaccine Completed: yes x4  Date of COVID positive in last 90 days: no  PCP - Janie Morning, DO Cardiologist - Alyssa Grove, MD  Cardiac clearance by Alyssa Grove 05/29/21 in Epic   Chest x-ray - 09/03/20 Epic EKG - 08/20/20 req [facility] Stress Test - 07/05/20 CE ECHO - 11/29/20 CE Cardiac Cath - 07/22/20 Epic Pacemaker/ICD device last checked: 04/30/21 Epic Spinal Cord Stimulator: n/a  Bowel Prep - no  Sleep Study - yes, positive CPAP - yes every night  Fasting Blood Sugar - 46-115 Checks Blood Sugar 3 times a day  Blood Thinner Instructions: Plavix, hold 7 days Aspirin Instructions: ASA 81, hold 7 days Last Dose: 07/24/21 1030  Activity level: Can perform activities of daily living without stopping and without symptoms of chest pain. Difficulty with stairs due to knee. Has elevator and stair glide. SOB on exertion, cards aware per pt.    Anesthesia review: CHF, HTN, CAD, MI, OSA, cardiomyopathy, cirrhosis, DM, free style libre  Patient denies shortness of breath, fever, cough and chest pain at PAT appointment   Patient verbalized understanding of instructions that were given to them at the PAT appointment. Patient was also instructed that they will need to review over the PAT instructions again at home before surgery.

## 2021-07-18 NOTE — Progress Notes (Signed)
Sent message, via epic in basket, requesting orders in epic from surgeon.  

## 2021-07-18 NOTE — Patient Instructions (Addendum)
DUE TO COVID-19 ONLY TWO VISITORS  (aged 77 and older)  ARE ALLOWED TO COME WITH YOU AND STAY IN THE WAITING ROOM ONLY DURING PRE OP AND PROCEDURE.   **NO VISITORS ARE ALLOWED IN THE SHORT STAY AREA OR RECOVERY ROOM!!**  IF YOU WILL BE ADMITTED INTO THE HOSPITAL YOU ARE ALLOWED ONLY FOUR SUPPORT PEOPLE DURING VISITATION HOURS ONLY (7 AM -8PM)   The support person(s) must pass our screening, gel in and out, and wear a mask at all times, including in the patient's room. Patients must also wear a mask when staff or their support person are in the room. Visitors GUEST BADGE MUST BE WORN VISIBLY  One adult visitor may remain with you overnight and MUST be in the room by 8 P.M.     Your procedure is scheduled on: 07/31/21   Report to [facility] Main Entrance    Report to admitting at 8:25 AM   Call this number if you have problems the morning of surgery [phone]   Do not eat food :After Midnight.   After Midnight you may have the following liquids until 8:05 AM DAY OF SURGERY  Water Black Coffee (sugar ok, NO MILK/CREAM OR CREAMERS)  Tea (sugar ok, NO MILK/CREAM OR CREAMERS) regular and decaf                             Plain Jell-O (NO RED)                                           Fruit ices (not with fruit pulp, NO RED)                                     Popsicles (NO RED)                                                                  Juice: apple, WHITE grape, WHITE cranberry Sports drinks like Gatorade (NO RED) Clear broth(vegetable,chicken,beef)    FOLLOW BOWEL PREP AND ANY ADDITIONAL PRE OP INSTRUCTIONS YOU RECEIVED FROM YOUR SURGEON'S OFFICE!!!     Oral Hygiene is also important to reduce your risk of infection.                                    Remember - BRUSH YOUR TEETH THE MORNING OF SURGERY WITH YOUR REGULAR TOOTHPASTE   Take these medicines the morning of surgery with A SIP OF WATER: Tylenol, Inhalers, Allopurinol, Cabidopa-Levodopa, Duloxetine, Zetia,  Metoprolol, Gabapentin, Isosorbide, Omeprazole, Oxybutynin, Primidone.   DO NOT TAKE ANY ORAL DIABETIC MEDICATIONS DAY OF YOUR SURGERY  How to Manage Your Diabetes Before and After Surgery  Why is it important to control my blood sugar before and after surgery? Improving blood sugar levels before and after surgery helps healing and can limit problems. A way of improving blood sugar control is eating a healthy diet by:  Eating less sugar and carbohydrates  Increasing  activity/exercise  Talking with your doctor about reaching your blood sugar goals High blood sugars (greater than 180 mg/dL) can raise your risk of infections and slow your recovery, so you will need to focus on controlling your diabetes during the weeks before surgery. Make sure that the doctor who takes care of your diabetes knows about your planned surgery including the date and location.  How do I manage my blood sugar before surgery? Check your blood sugar at least 4 times a day, starting 2 days before surgery, to make sure that the level is not too high or low. Check your blood sugar the morning of your surgery when you wake up and every 2 hours until you get to the Short Stay unit. If your blood sugar is less than 70 mg/dL, you will need to treat for low blood sugar: Do not take insulin. Treat a low blood sugar (less than 70 mg/dL) with  cup of clear juice (cranberry or apple), 4 glucose tablets, OR glucose gel. Recheck blood sugar in 15 minutes after treatment (to make sure it is greater than 70 mg/dL). If your blood sugar is not greater than 70 mg/dL on recheck, call [phone] for further instructions. Report your blood sugar to the short stay nurse when you get to Short Stay.  If you are admitted to the hospital after surgery: Your blood sugar will be checked by the staff and you will probably be given insulin after surgery (instead of oral diabetes medicines) to make sure you have good blood sugar levels. The  goal for blood sugar control after surgery is 80-180 mg/dL.   WHAT DO I DO ABOUT MY DIABETES MEDICATION?  Do not take oral diabetes medicines (pills) the morning of surgery.  THE DAY BEFORE SURGERY, do not take Jardiance.       THE MORNING OF SURGERY, do not take Jardiance.  Reviewed and Endorsed by [facility] Patient Education Committee, August 2015   Bring CPAP mask and tubing day of surgery.                              You may not have any metal on your body including jewelry, and body piercing             Do not wear  lotions, powders, cologne, or deodorant              Men may shave face and neck.   Do not bring valuables to the hospital. [place].   Contacts, dentures or bridgework may not be worn into surgery.   Bring small overnight bag day of surgery.               Please read over the following fact sheets you were given: IF YOU HAVE QUESTIONS ABOUT YOUR PRE-OP INSTRUCTIONS PLEASE CALL [facility] - Preparing for Surgery Before surgery, you can play an important role.  Because skin is not sterile, your skin needs to be as free of germs as possible.  You can reduce the number of germs on your skin by washing with CHG (chlorahexidine gluconate) soap before surgery.  CHG is an antiseptic cleaner which kills germs and bonds with the skin to continue killing germs even after washing. Please DO NOT use if you have an allergy to CHG  or antibacterial soaps.  If your skin becomes reddened/irritated stop using the CHG and inform your nurse when you arrive at Short Stay. Do not shave (including legs and underarms) for at least 48 hours prior to the first CHG shower.  You may shave your face/neck.  Please follow these instructions carefully:  1.  Shower with CHG Soap the night before surgery and the  morning of surgery.  2.  If you choose to wash your hair, wash your hair first as usual with your normal   shampoo.  3.  After you shampoo, rinse your hair and body thoroughly to remove the shampoo.                             4.  Use CHG as you would any other liquid soap.  You can apply chg directly to the skin and wash.  Gently with a scrungie or clean washcloth.  5.  Apply the CHG Soap to your body ONLY FROM THE NECK DOWN.   Do   not use on face/ open                           Wound or open sores. Avoid contact with eyes, ears mouth and   genitals (private parts).                       Wash face,  Genitals (private parts) with your normal soap.             6.  Wash thoroughly, paying special attention to the area where your    surgery  will be performed.  7.  Thoroughly rinse your body with warm water from the neck down.  8.  DO NOT shower/wash with your normal soap after using and rinsing off the CHG Soap.                9.  Pat yourself dry with a clean towel.            10.  Wear clean pajamas.            11.  Place clean sheets on your bed the night of your first shower and do not  sleep with pets. Day of Surgery : Do not apply any lotions/deodorants the morning of surgery.  Please wear clean clothes to the hospital/surgery center.  FAILURE TO FOLLOW THESE INSTRUCTIONS MAY RESULT IN THE CANCELLATION OF YOUR SURGERY  PATIENT SIGNATURE_________________________________  NURSE SIGNATURE__________________________________  ________________________________________________________________________   Adam Phenix  An incentive spirometer is a tool that can help keep your lungs clear and active. This tool measures how well you are filling your lungs with each breath. Taking long deep breaths may help reverse or decrease the chance of developing breathing (pulmonary) problems (especially infection) following: A long period of time when you are unable to move or be active. BEFORE THE PROCEDURE  If the spirometer includes an indicator to show your best effort, your nurse or respiratory  therapist will set it to a desired goal. If possible, sit up straight or lean slightly forward. Try not to slouch. Hold the incentive spirometer in an upright position. INSTRUCTIONS FOR USE  Sit on the edge of your bed if possible, or sit up as far as you can in bed or on a chair. Hold the incentive spirometer in an upright position. Breathe out normally. Place the  mouthpiece in your mouth and seal your lips tightly around it. Breathe in slowly and as deeply as possible, raising the piston or the ball toward the top of the column. Hold your breath for 3-5 seconds or for as long as possible. Allow the piston or ball to fall to the bottom of the column. Remove the mouthpiece from your mouth and breathe out normally. Rest for a few seconds and repeat Steps 1 through 7 at least 10 times every 1-2 hours when you are awake. Take your time and take a few normal breaths between deep breaths. The spirometer may include an indicator to show your best effort. Use the indicator as a goal to work toward during each repetition. After each set of 10 deep breaths, practice coughing to be sure your lungs are clear. If you have an incision (the cut made at the time of surgery), support your incision when coughing by placing a pillow or rolled up towels firmly against it. Once you are able to get out of bed, walk around indoors and cough well. You may stop using the incentive spirometer when instructed by your caregiver.  RISKS AND COMPLICATIONS Take your time so you do not get dizzy or light-headed. If you are in pain, you may need to take or ask for pain medication before doing incentive spirometry. It is harder to take a deep breath if you are having pain. AFTER USE Rest and breathe slowly and easily. It can be helpful to keep track of a log of your progress. Your caregiver can provide you with a simple table to help with this. If you are using the spirometer at home, follow these instructions: [place] IF:  You are having difficultly using the spirometer. You have trouble using the spirometer as often as instructed. Your pain medication is not giving enough relief while using the spirometer. You develop fever of 100.5 F (38.1 C) or higher. SEEK IMMEDIATE MEDICAL CARE IF:  You cough up bloody sputum that had not been present before. You develop fever of 102 F (38.9 C) or greater. You develop worsening pain at or near the incision site. MAKE SURE YOU:  Understand these instructions. Will watch your condition. Will get help right away if you are not doing well or get worse. Document Released: 06/15/2006 Document Revised: 04/27/2011 Document Reviewed: 08/16/2006 [facility] Patient Information 2014 [place], [place].   ________________________________________________________________________

## 2021-07-21 ENCOUNTER — Encounter (HOSPITAL_COMMUNITY): Payer: Self-pay

## 2021-07-21 ENCOUNTER — Encounter (HOSPITAL_COMMUNITY)
Admission: RE | Admit: 2021-07-21 | Discharge: 2021-07-21 | Disposition: A | Discharge status: Home / Self Care | Payer: No Typology Code available for payment source | Source: Ambulatory Visit | Attending: Orthopedic Surgery | Admitting: Orthopedic Surgery

## 2021-07-21 VITALS — BP 121/72 | HR 82 | Resp 16 | Ht 66.5 in | Wt 223.2 lb

## 2021-07-21 DIAGNOSIS — I2510 Atherosclerotic heart disease of native coronary artery without angina pectoris: Secondary | ICD-10-CM | POA: Insufficient documentation

## 2021-07-21 DIAGNOSIS — G4733 Obstructive sleep apnea (adult) (pediatric): Secondary | ICD-10-CM | POA: Insufficient documentation

## 2021-07-21 DIAGNOSIS — Z87891 Personal history of nicotine dependence: Secondary | ICD-10-CM | POA: Insufficient documentation

## 2021-07-21 DIAGNOSIS — K3184 Gastroparesis: Secondary | ICD-10-CM | POA: Diagnosis not present

## 2021-07-21 DIAGNOSIS — I110 Hypertensive heart disease with heart failure: Secondary | ICD-10-CM | POA: Diagnosis not present

## 2021-07-21 DIAGNOSIS — M1712 Unilateral primary osteoarthritis, left knee: Secondary | ICD-10-CM | POA: Insufficient documentation

## 2021-07-21 DIAGNOSIS — E1143 Type 2 diabetes mellitus with diabetic autonomic (poly)neuropathy: Secondary | ICD-10-CM | POA: Insufficient documentation

## 2021-07-21 DIAGNOSIS — K7031 Alcoholic cirrhosis of liver with ascites: Secondary | ICD-10-CM | POA: Insufficient documentation

## 2021-07-21 DIAGNOSIS — Z95810 Presence of automatic (implantable) cardiac defibrillator: Secondary | ICD-10-CM | POA: Insufficient documentation

## 2021-07-21 DIAGNOSIS — Z01812 Encounter for preprocedural laboratory examination: Secondary | ICD-10-CM | POA: Insufficient documentation

## 2021-07-21 DIAGNOSIS — Z01818 Encounter for other preprocedural examination: Secondary | ICD-10-CM

## 2021-07-21 HISTORY — DX: Gastro-esophageal reflux disease without esophagitis: K21.9

## 2021-07-21 HISTORY — DX: Other complications of anesthesia, initial encounter: T88.59XA

## 2021-07-21 HISTORY — DX: Presence of automatic (implantable) cardiac defibrillator: Z95.810

## 2021-07-21 HISTORY — DX: Cellulitis, unspecified: L03.90

## 2021-07-21 HISTORY — DX: Gout, unspecified: M10.9

## 2021-07-21 HISTORY — DX: Unspecified osteoarthritis, unspecified site: M19.90

## 2021-07-21 HISTORY — DX: Dyspnea, unspecified: R06.00

## 2021-07-21 HISTORY — DX: Atherosclerotic heart disease of native coronary artery without angina pectoris: I25.10

## 2021-07-21 LAB — CBC
HCT: 43.1 % (ref 39.0–52.0)
Hemoglobin: 13.8 g/dL (ref 13.0–17.0)
MCH: 31.9 pg (ref 26.0–34.0)
MCHC: 32 g/dL (ref 30.0–36.0)
MCV: 99.8 fL (ref 80.0–100.0)
Platelets: 192 10*3/uL (ref 150–400)
RBC: 4.32 MIL/uL (ref 4.22–5.81)
RDW: 15.9 % — ABNORMAL HIGH (ref 11.5–15.5)
WBC: 4.3 10*3/uL (ref 4.0–10.5)
nRBC: 0 % (ref 0.0–0.2)

## 2021-07-21 LAB — COMPREHENSIVE METABOLIC PANEL
ALT: 14 U/L (ref 0–44)
AST: 28 U/L (ref 15–41)
Albumin: 3.8 g/dL (ref 3.5–5.0)
Alkaline Phosphatase: 67 U/L (ref 38–126)
Anion gap: 6 (ref 5–15)
BUN: 22 mg/dL (ref 8–23)
CO2: 27 mmol/L (ref 22–32)
Calcium: 9.1 mg/dL (ref 8.9–10.3)
Chloride: 106 mmol/L (ref 98–111)
Creatinine, Ser: 1.23 mg/dL (ref 0.61–1.24)
GFR, Estimated: >60 mL/min (ref >60)
Glucose, Bld: 100 mg/dL — ABNORMAL HIGH (ref 70–99)
Potassium: 4.9 mmol/L (ref 3.5–5.1)
Sodium: 139 mmol/L (ref 135–145)
Total Bilirubin: 0.6 mg/dL (ref 0.3–1.2)
Total Protein: 7.7 g/dL (ref 6.5–8.1)

## 2021-07-21 LAB — GLUCOSE, CAPILLARY: Glucose-Capillary: 97 mg/dL (ref 70–99)

## 2021-07-21 LAB — SURGICAL PCR SCREEN
MRSA, PCR: NEGATIVE
Staphylococcus aureus: NEGATIVE

## 2021-07-21 LAB — HEMOGLOBIN A1C
Hgb A1c MFr Bld: 6.6 % — ABNORMAL HIGH (ref 4.8–5.6)
Mean Plasma Glucose: 142.72 mg/dL

## 2021-07-22 ENCOUNTER — Ambulatory Visit: Payer: Self-pay | Admitting: Student

## 2021-07-22 NOTE — Progress Notes (Signed)
Anesthesia Chart Review   Case: [id number] Date/Time: 07/31/21 1052   Procedure: COMPUTER ASSISTED TOTAL KNEE ARTHROPLASTY (Left: Knee) - 150   Anesthesia type: Spinal   Pre-op diagnosis: Left knee osteoarthritis   Location: [facility] ROOM 08 / [facility]   Surgeons: Rod Can, MD       DISCUSSION:76 y.o. former smoker with h/o HTN, OSA, CAD, CHF (EF 30-35% on Echo 11/2020), AICD in place (last device check 05/27/21), DM II, left knee OA scheduled for above procedure 07/31/2021 with Dr. Rod Can.   Pt seen by cardiology 05/29/2021 for preoperative evaluation.  Per OV note, "At moderate but medically optimized risk for CHF complications perioperatively. He is knowledgeable and can self adjust furosemide and he also knows to seek medical care if shortness of breath worsens perioperatively. Fairly low risk for ischemic complications. Therefore there are no cardiac contraindications to planned knee surgery."  Pt advised to hold Plavix 7 days prior to procedure.   Device orders requested by PAT nurse, pending.   Anticipate pt can proceed with planned procedure barring acute status change.   VS: BP 121/72   Pulse 82   Resp 16   Ht 5' 6.5" (1.689 m)   Wt 101.2 kg   SpO2 100%   BMI 35.49 kg/m   PROVIDERS: Janie Morning, DO is PCP   Alyssa Grove, MD is Cardiologist  LABS: Labs reviewed: Acceptable for surgery. (all labs ordered are listed, but only abnormal results are displayed)  Labs Reviewed  HEMOGLOBIN A1C - Abnormal; Notable for the following components:      Result Value   Hgb A1c MFr Bld 6.6 (*)    All other components within normal limits  COMPREHENSIVE METABOLIC PANEL - Abnormal; Notable for the following components:   Glucose, Bld 100 (*)    All other components within normal limits  CBC - Abnormal; Notable for the following components:   RDW 15.9 (*)    All other components within normal limits  SURGICAL PCR SCREEN  GLUCOSE, CAPILLARY      IMAGES:   EKG: 07/22/2020 Rate 75 bpm  AV dual paced rhythm with prolonged AV conduction   CV: Cardiac Cath 07/22/2020 Conclusions: Mild to moderate, non-obstructive coronary artery disease, as detailed below. Patent LCx stent(s) with mild in-stent restenosis. Upper normal left ventricular filling pressure.   Recommendations: Continue medical therapy and risk factor modification.    TTE Oct 2022:  SUMMARY Evidence of 'moderate' ischemic cardiomyopathy.   The left ventricular size is normal. There is mild concentric left ventricular hypertrophy. There is akinesis of the 'basal and mid inferior and inferolateral' left ventricular segments, consistent with prior infarct, and hypokinesis of the remaining left ventricular segments, consistent with left ventricular [ischemic] remodelling. Left ventricular systolic function is moderately reduced. LV ejection fraction = 30-35%. There is mild diastolic dysfunction, Grade IA, with normal left atrial pressure. The right ventricle is normal size. The right ventricular systolic function is mildly reduced. The atria are normal in size. There is no significant valvular stenosis or regurgitation. There was insufficient TR detected to calculate RV systolic pressure. The IVC is normal in size with an inspiratory collapse of greater then 50%, suggesting normal right atrial pressure. Past Medical History:  Diagnosis Date   AICD (automatic cardioverter/defibrillator) present    Arthritis    Cancer ([place])    prostate  (Radiation only)   Cellulitis    CHF (congestive heart failure) (HCC)    Complication of anesthesia    slow to come  out of anesthesia   Coronary artery disease    Diabetes mellitus    Dyspnea    GERD (gastroesophageal reflux disease)    Gout    Hypertension    Kidney stones    Leg swelling    Low back pain    Myocardial infarction ([place]) 12/09/1993   Obese    OSA (obstructive sleep apnea)    Peripheral  neuropathy    PTSD (post-traumatic stress disorder)     Past Surgical History:  Procedure Laterality Date   CARDIAC DEFIBRILLATOR PLACEMENT     COLONOSCOPY WITH PROPOFOL N/A 10/26/2017   Procedure: COLONOSCOPY WITH PROPOFOL;  Surgeon: Wonda Horner, MD;  Location: [facility] ENDOSCOPY;  Service: Endoscopy;  Laterality: N/A;   CORONARY ANGIOPLASTY WITH STENT PLACEMENT  1995    x 3 stents   INTRAVASCULAR PRESSURE WIRE/FFR STUDY N/A 07/22/2020   Procedure: INTRAVASCULAR PRESSURE WIRE/FFR STUDY;  Surgeon: Nelva Bush, MD;  Location: [place] [facility];  Service: Cardiovascular;  Laterality: N/A;  CFX and RCA    JOINT REPLACEMENT  2004   right total knee replacement   LEFT HEART CATH AND CORONARY ANGIOGRAPHY N/A 07/22/2020   Procedure: LEFT HEART CATH AND CORONARY ANGIOGRAPHY;  Surgeon: Nelva Bush, MD;  Location: [place] [facility];  Service: Cardiovascular;  Laterality: N/A;   MULTIPLE TOOTH EXTRACTIONS     Total Tooth extraction   PACEMAKER INSERTION     PACEMAKER PLACEMENT      MEDICATIONS:  acetaminophen (TYLENOL) 325 MG tablet   albuterol (PROVENTIL HFA;VENTOLIN HFA) 108 (90 BASE) MCG/ACT inhaler   allopurinol (ZYLOPRIM) 100 MG tablet   amoxicillin-clavulanate (AUGMENTIN) 875-125 MG tablet   ascorbic acid (VITAMIN C) 500 MG tablet   aspirin EC 81 MG tablet   atorvastatin (LIPITOR) 80 MG tablet   carbidopa-levodopa (SINEMET IR) 25-100 MG tablet   Cholecalciferol 50 MCG (2000 UT) TABS   clopidogrel (PLAVIX) 75 MG tablet   colchicine 0.6 MG tablet   COVID-19 mRNA Vac-TriS, Pfizer, (PFIZER-BIONT COVID-19 VAC-TRIS) SUSP injection   dapagliflozin propanediol (FARXIGA) 10 MG TABS tablet   docusate sodium (COLACE) 100 MG capsule   DULoxetine (CYMBALTA) 60 MG capsule   empagliflozin (JARDIANCE) 25 MG TABS tablet   ezetimibe (ZETIA) 10 MG tablet   Ferrous Sulfate (IRON) 325 (65 Fe) MG TABS   furosemide (LASIX) 40 MG tablet   gabapentin (NEURONTIN) 400 MG capsule   glucose 4 GM  chewable tablet   indomethacin (INDOCIN) 50 MG capsule   isosorbide mononitrate (IMDUR) 60 MG 24 hr tablet   Magnesium 400 MG TABS   methocarbamol (ROBAXIN) 500 MG tablet   metoprolol succinate (TOPROL-XL) 100 MG 24 hr tablet   Multiple Vitamin (MULTIVITAMIN WITH MINERALS) TABS tablet   nitroGLYCERIN (NITROSTAT) 0.4 MG SL tablet   omeprazole (PRILOSEC) 20 MG capsule   oxybutynin (DITROPAN-XL) 5 MG 24 hr tablet   polyethylene glycol (MIRALAX / GLYCOLAX) packet   potassium chloride (K-DUR,KLOR-CON) 10 MEQ tablet   primidone (MYSOLINE) 50 MG tablet   sacubitril-valsartan (ENTRESTO) 49-51 MG   Semaglutide, 2 MG/DOSE, (OZEMPIC, 2 MG/DOSE,) 8 MG/3ML SOPN   Semaglutide, 2 MG/DOSE, (OZEMPIC, 2 MG/DOSE,) 8 MG/3ML SOPN   Semaglutide, 2 MG/DOSE, (OZEMPIC, 2 MG/DOSE,) 8 MG/3ML SOPN   spironolactone (ALDACTONE) 25 MG tablet   tamsulosin (FLOMAX) 0.4 MG CAPS capsule   terbinafine (LAMISIL) 1 % cream   traZODone (DESYREL) 150 MG tablet   No current facility-administered medications for this encounter.     Konrad Felix Ward, PA-C [facility]  Pre-Surgical Testing [phone]

## 2021-07-22 NOTE — H&P (Signed)
TOTAL KNEE ADMISSION H&P  Patient is being admitted for left total knee arthroplasty.  Subjective:  Chief Complaint:left knee pain.  HPI: Johnny Ochoa., 76 y.o. male, has a history of pain and functional disability in the left knee due to arthritis and has failed non-surgical conservative treatments for greater than 12 weeks to includeNSAID's and/or analgesics, corticosteriod injections, viscosupplementation injections, flexibility and strengthening excercises, use of assistive devices, weight reduction as appropriate, and activity modification.  Onset of symptoms was gradual, starting 10 years ago with rapidlly worsening course since that time. The patient noted no past surgery on the left knee(s).  Patient currently rates pain in the left knee(s) at 10 out of 10 with activity. Patient has night pain, worsening of pain with activity and weight bearing, pain that interferes with activities of daily living, pain with passive range of motion, crepitus, and joint swelling.  Patient has evidence of subchondral cysts, subchondral sclerosis, periarticular osteophytes, and joint space narrowing by imaging studies. There is no active infection.  Patient Active Problem List   Diagnosis Date Noted   Chronic combined systolic and diastolic heart failure ([place])    Acute gout 08/16/2018   Anesthesia complication 69/67/8938   Hearing loss 08/02/2018   GERD (gastroesophageal reflux disease) 08/02/2018   Osteoarthritis of left knee 01/31/2018   Blood in stool 10/22/2017   Liver cirrhosis ([place]) 10/22/2017   GI bleed 10/22/2017   Anxiety 10/28/2016   Arthritis 10/28/2016   Bradycardia 10/28/2016   Degeneration of lumbar intervertebral disc 10/28/2016   Irregular heart rate 10/28/2016   Male stress incontinence 10/28/2016   Myalgia 10/28/2016   Neck pain 10/28/2016   Occipital neuralgia 10/28/2016   Pain in left knee 10/28/2016   Peripheral neuropathy 10/28/2016   Diabetic neuropathy ([place]) 02/26/2016    Essential hypertension 02/26/2016   CAD in native artery 02/26/2016   Hyperlipidemia 02/26/2016   Elevated troponin 02/26/2016   Morbid obesity ([place]) 02/26/2016   Depression with anxiety 02/26/2016   Hypokalemia 02/26/2016   Cardiac pacemaker in situ 02/26/2016   CAP (community acquired pneumonia) 02/25/2016   Multifocal pneumonia 02/25/2016   Acute respiratory failure with hypoxia ([place]) 02/25/2016   Ischemic cardiomyopathy 10/04/2015   Diabetic gastroparesis associated with type 2 diabetes mellitus ([place]) 07/18/2015   Unstable angina ([place]) 04/19/2015   Cardiomyopathy ([place]) 03/27/2015   Coronary artery disease involving native coronary artery of native heart with unstable angina pectoris ([place]) 03/27/2015   OSA on CPAP 03/27/2015   Shortness of breath 03/27/2015   Acute on chronic systolic CHF (congestive heart failure) ([place]) 06/23/2014   Uncontrolled diabetes mellitus with complications 12/02/5100   Anemia 11/30/2013   Other autoimmune hemolytic anemias 11/30/2013   Past Medical History:  Diagnosis Date   AICD (automatic cardioverter/defibrillator) present    Arthritis    Cancer ([place])    prostate  (Radiation only)   Cellulitis    CHF (congestive heart failure) ([place])    Complication of anesthesia    slow to come out of anesthesia   Coronary artery disease    Diabetes mellitus    Dyspnea    GERD (gastroesophageal reflux disease)    Gout    Hypertension    Kidney stones    Leg swelling    Low back pain    Myocardial infarction ([place]) 12/09/1993   Obese    OSA (obstructive sleep apnea)    Peripheral neuropathy    PTSD (post-traumatic stress disorder)     Past Surgical History:  Procedure  Laterality Date   CARDIAC DEFIBRILLATOR PLACEMENT     COLONOSCOPY WITH PROPOFOL N/A 10/26/2017   Procedure: COLONOSCOPY WITH PROPOFOL;  Surgeon: Wonda Horner, MD;  Location: [facility] END[facility]COPY;  Service: Endoscopy;  Laterality: N/A;   CORONARY ANGIOPLASTY WITH STENT PLACEMENT  1995    x  3 stents   INTRAVASCULAR PRESSURE WIRE/FFR STUDY N/A 07/22/2020   Procedure: INTRAVASCULAR PRESSURE WIRE/FFR STUDY;  Surgeon: Nelva Bush, MD;  Location: [place] [facility];  Service: Cardiovascular;  Laterality: N/A;  CFX and RCA    JOINT REPLACEMENT  2004   right total knee replacement   LEFT HEART CATH AND CORONARY ANGIOGRAPHY N/A 07/22/2020   Procedure: LEFT HEART CATH AND CORONARY ANGIOGRAPHY;  Surgeon: Nelva Bush, MD;  Location: [place] [facility];  Service: Cardiovascular;  Laterality: N/A;   MULTIPLE TOOTH EXTRACTIONS     Total Tooth extraction   PACEMAKER INSERTION     PACEMAKER PLACEMENT      Current Outpatient Medications  Medication Sig Dispense Refill Last Dose   acetaminophen (TYLENOL) 325 MG tablet Take 650 mg by mouth every 6 (six) hours as needed for moderate pain (pain).      albuterol (PROVENTIL HFA;VENTOLIN HFA) 108 (90 BASE) MCG/ACT inhaler Inhale 2 puffs into the lungs every 4 (four) hours as needed for wheezing or shortness of breath. 1 Inhaler 0    allopurinol (ZYLOPRIM) 100 MG tablet Take 100 mg by mouth daily.      amoxicillin-clavulanate (AUGMENTIN) 875-125 MG tablet Take 1 tablet by mouth every 12 (twelve) hours. (Patient not taking: Reported on 07/17/2021) 14 tablet 0    ascorbic acid (VITAMIN C) 500 MG tablet Take 500 mg by mouth daily.      aspirin EC 81 MG tablet Take 81 mg by mouth daily.      atorvastatin (LIPITOR) 80 MG tablet Take 40 mg by mouth at bedtime.       carbidopa-levodopa (SINEMET IR) 25-100 MG tablet Take 1 tablet by mouth in the morning, at noon, and at bedtime.      Cholecalciferol 50 MCG (2000 UT) TABS Take 2,000 Units by mouth daily.      clopidogrel (PLAVIX) 75 MG tablet Take 75 mg by mouth daily.  3    colchicine 0.6 MG tablet TAKE 1 TABLET BY MOUTH DAILY 30 tablet 0    COVID-19 mRNA Vac-TriS, Pfizer, (PFIZER-BIONT COVID-19 VAC-TRIS) SUSP injection Inject into the muscle. (Patient not taking: Reported on 07/17/2021) 0.3 mL 0     dapagliflozin propanediol (FARXIGA) 10 MG TABS tablet Take 1 tablet (10 mg total) by mouth daily before breakfast. (Patient not taking: Reported on 07/17/2021) 30 tablet 2    docusate sodium (COLACE) 100 MG capsule Take 100 mg by mouth 2 (two) times daily.      DULoxetine (CYMBALTA) 60 MG capsule Take 60 mg by mouth daily.       empagliflozin (JARDIANCE) 25 MG TABS tablet Take 25 mg by mouth daily.      ezetimibe (ZETIA) 10 MG tablet Take 10 mg by mouth daily.      Ferrous Sulfate (IRON) 325 (65 Fe) MG TABS Take 325 mg by mouth daily. (Patient not taking: Reported on 07/17/2021)      furosemide (LASIX) 40 MG tablet Take 1 tablet (40 mg total) by mouth daily. Take an extra dose if you gain 2 lbs in 24 hours. 60 tablet 3    gabapentin (NEURONTIN) 400 MG capsule Take 400 mg by mouth 3 (three) times daily.  glucose 4 GM chewable tablet Chew 1 tablet by mouth as needed for low blood sugar.      indomethacin (INDOCIN) 50 MG capsule TAKE 1 CAPSULE(50 MG) BY MOUTH TWICE DAILY WITH A MEAL (Patient not taking: Reported on 07/17/2021) 180 capsule 1    isosorbide mononitrate (IMDUR) 60 MG 24 hr tablet Take 1 tablet (60 mg total) by mouth daily. 30 tablet 2    Magnesium 400 MG TABS Take 400 mg by mouth daily.      methocarbamol (ROBAXIN) 500 MG tablet Take 500 mg by mouth at bedtime.      metoprolol succinate (TOPROL-XL) 100 MG 24 hr tablet Take 100 mg by mouth daily. Take with or immediately following a meal.      Multiple Vitamin (MULTIVITAMIN WITH MINERALS) TABS tablet Take 1 tablet by mouth daily.      nitroGLYCERIN (NITROSTAT) 0.4 MG SL tablet Place 0.4 mg under the tongue every 5 (five) minutes as needed for chest pain.       omeprazole (PRILOSEC) 20 MG capsule Take 20 mg by mouth daily.      oxybutynin (DITROPAN-XL) 5 MG 24 hr tablet Take 5 mg by mouth daily.      polyethylene glycol (MIRALAX / GLYCOLAX) packet Take 17 g by mouth daily as needed (constipation).       potassium chloride (K-DUR,KLOR-CON)  10 MEQ tablet Take 10 mEq by mouth daily.      primidone (MYSOLINE) 50 MG tablet Take 150 mg by mouth daily.      sacubitril-valsartan (ENTRESTO) 49-51 MG Take 1 tablet 2 (two) times daily by mouth.      Semaglutide, 2 MG/DOSE, (OZEMPIC, 2 MG/DOSE,) 8 MG/3ML SOPN Inject 2 mg into the skin once a week (Patient not taking: Reported on 07/17/2021) 3 mL 5    Semaglutide, 2 MG/DOSE, (OZEMPIC, 2 MG/DOSE,) 8 MG/3ML SOPN Inject '2mg'$  into the skin once a week (Patient not taking: Reported on 07/17/2021) 3 mL 3    Semaglutide, 2 MG/DOSE, (OZEMPIC, 2 MG/DOSE,) 8 MG/3ML SOPN Inject 2 mg into the skin once a week 9 mL 3    spironolactone (ALDACTONE) 25 MG tablet Take 25 mg by mouth daily.  0    tamsulosin (FLOMAX) 0.4 MG CAPS capsule Take 0.8 mg by mouth at bedtime.       terbinafine (LAMISIL) 1 % cream Apply 1 application topically daily as needed for itching.      traZODone (DESYREL) 150 MG tablet Take 150 mg by mouth at bedtime.      No current facility-administered medications for this visit.   Allergies  Allergen Reactions   Metformin Diarrhea    Social History   Tobacco Use   Smoking status: Former    Packs/day: 1.00    Years: 20.00    Pack years: 20.00    Types: Cigarettes    Quit date: 10/23/1980    Years since quitting: 40.7   Smokeless tobacco: Never  Substance Use Topics   Alcohol use: No    Alcohol/week: 0.0 standard drinks    Family History  Problem Relation Age of Onset   Heart attack Mother    Hypertension Mother    Diabetes Other    Hypertension Other    Heart disease Other    Arthritis Other    Heart disease Sister    Heart disease Brother      Review of Systems  HENT:  Positive for hearing loss.   Cardiovascular:  Positive for leg swelling.  Gastrointestinal:  Positive for abdominal distention.  Genitourinary:  Positive for difficulty urinating.  Musculoskeletal:  Positive for arthralgias, back pain, gait problem, joint swelling, myalgias and neck pain.    Objective:  Physical Exam Constitutional:      Appearance: Normal appearance. He is obese.  HENT:     Head: Normocephalic and atraumatic.     Nose: Nose normal.     Mouth/Throat:     Mouth: Mucous membranes are moist.     Pharynx: Oropharynx is clear.  Eyes:     Extraocular Movements: Extraocular movements intact.  Cardiovascular:     Rate and Rhythm: Normal rate and regular rhythm.     Pulses: Normal pulses.     Heart sounds: Normal heart sounds.     Comments: Has pacemaker/ defibrillator. Pulmonary:     Effort: Pulmonary effort is normal.     Breath sounds: Normal breath sounds.  Abdominal:     Palpations: Abdomen is soft.  Genitourinary:    Comments: deferred Musculoskeletal:     Cervical back: Normal range of motion and neck supple.     Right lower leg: 1+ Pitting Edema present.     Left lower leg: 1+ Pitting Edema present.     Comments: Examination of the left knee reveals no skin wounds, lesions, rashes, or erythema. Significant pain with palpation over the medial and lateral joint line. Range of motion 12-100. No instability. No extensor lag. Painless range of motion of the hip.  Right knee reveals a well healed incision consistent with TKA.   Skin:    General: Skin is warm and dry.     Capillary Refill: Capillary refill takes less than 2 seconds.  Neurological:     General: No focal deficit present.     Mental Status: He is alert and oriented to person, place, and time.  Psychiatric:        Mood and Affect: Mood normal.        Behavior: Behavior normal.        Thought Content: Thought content normal.        Judgment: Judgment normal.    Vital signs in last 24 hours: '@VSRANGES'$ @  Labs:   Estimated body mass index is 35.49 kg/m as calculated from the following:   Height as of 07/21/21: 5' 6.5" (1.689 m).   Weight as of 07/21/21: 101.2 kg.   Imaging Review Plain radiographs demonstrate severe degenerative joint disease of the left knee(s). The overall  alignment ismild varus. The bone quality appears to be adequate for age and reported activity level.      Assessment/Plan:  End stage arthritis, left knee   The patient history, physical examination, clinical judgment of the provider and imaging studies are consistent with end stage degenerative joint disease of the left knee(s) and total knee arthroplasty is deemed medically necessary. The treatment options including medical management, injection therapy arthroscopy and arthroplasty were discussed at length. The risks and benefits of total knee arthroplasty were presented and reviewed. The risks due to aseptic loosening, infection, stiffness, patella tracking problems, thromboembolic complications and other imponderables were discussed. The patient acknowledged the explanation, agreed to proceed with the plan and consent was signed. Patient is being admitted for inpatient treatment for surgery, pain control, PT, OT, prophylactic antibiotics, VTE prophylaxis, progressive ambulation and ADL's and discharge planning. The patient is planning to be discharged home after an overnight stay with OPPT.   Therapy Plans: outpatient therapy  Disposition: Home with wife Planned DVT Prophylaxis: aspirin  $'81mg'q$  BID and plavix.  DME needed: walker PCP: Cleared Cardiology: Cleared. Stop plavix 5 days prior to surgery.  TXA: Yes Allergies:  - Metformin - diarrhea Anesthesia Concerns: Difficulty waking up in the past.  BMI: 39.4 Last HgbA1c: 6.6 Other: - Sleep apnea, doesn't use CPAP.  - On aspirin and plavix at baseline - CHF with ischemia and reduced EF 30-35.  - Has pacemaker/defibrillator.  - Urinary retention - Diabetes - Chronic pain syndrome. Patient states he has been off narcotics for about 1 year.  - Cr 1.23. - Questionable COPD per patient.  - Hx GI bleed. No NSAIDs. - Chronic gout    Patient's anticipated LOS is less than 2 midnights, meeting these requirements: - Younger than 13 -  Lives within 1 hour of care - Has a competent adult at home to recover with post-op recover - NO history of  - Chronic pain requiring opiods  - Heart attack  - Stroke  - DVT/VTE  - Cardiac arrhythmia  - Respiratory Failure/COPD  - Renal failure  - Anemia  - Advanced Liver disease

## 2021-07-22 NOTE — H&P (View-Only) (Signed)
TOTAL KNEE ADMISSION H&P  Patient is being admitted for left total knee arthroplasty.  Subjective:  Chief Complaint:left knee pain.  HPI: Johnny Spurr., 76 y.o. male, has a history of pain and functional disability in the left knee due to arthritis and has failed non-surgical conservative treatments for greater than 12 weeks to includeNSAID's and/or analgesics, corticosteriod injections, viscosupplementation injections, flexibility and strengthening excercises, use of assistive devices, weight reduction as appropriate, and activity modification.  Onset of symptoms was gradual, starting 10 years ago with rapidlly worsening course since that time. The patient noted no past surgery on the left knee(s).  Patient currently rates pain in the left knee(s) at 10 out of 10 with activity. Patient has night pain, worsening of pain with activity and weight bearing, pain that interferes with activities of daily living, pain with passive range of motion, crepitus, and joint swelling.  Patient has evidence of subchondral cysts, subchondral sclerosis, periarticular osteophytes, and joint space narrowing by imaging studies. There is no active infection.  Patient Active Problem List   Diagnosis Date Noted   Chronic combined systolic and diastolic heart failure ([place])    Acute gout 08/16/2018   Anesthesia complication 41/74/0814   Hearing loss 08/02/2018   GERD (gastroesophageal reflux disease) 08/02/2018   Osteoarthritis of left knee 01/31/2018   Blood in stool 10/22/2017   Liver cirrhosis ([place]) 10/22/2017   GI bleed 10/22/2017   Anxiety 10/28/2016   Arthritis 10/28/2016   Bradycardia 10/28/2016   Degeneration of lumbar intervertebral disc 10/28/2016   Irregular heart rate 10/28/2016   Male stress incontinence 10/28/2016   Myalgia 10/28/2016   Neck pain 10/28/2016   Occipital neuralgia 10/28/2016   Pain in left knee 10/28/2016   Peripheral neuropathy 10/28/2016   Diabetic neuropathy ([place]) 02/26/2016    Essential hypertension 02/26/2016   CAD in native artery 02/26/2016   Hyperlipidemia 02/26/2016   Elevated troponin 02/26/2016   Morbid obesity ([place]) 02/26/2016   Depression with anxiety 02/26/2016   Hypokalemia 02/26/2016   Cardiac pacemaker in situ 02/26/2016   CAP (community acquired pneumonia) 02/25/2016   Multifocal pneumonia 02/25/2016   Acute respiratory failure with hypoxia ([place]) 02/25/2016   Ischemic cardiomyopathy 10/04/2015   Diabetic gastroparesis associated with type 2 diabetes mellitus ([place]) 07/18/2015   Unstable angina ([place]) 04/19/2015   Cardiomyopathy ([place]) 03/27/2015   Coronary artery disease involving native coronary artery of native heart with unstable angina pectoris ([place]) 03/27/2015   OSA on CPAP 03/27/2015   Shortness of breath 03/27/2015   Acute on chronic systolic CHF (congestive heart failure) ([place]) 06/23/2014   Uncontrolled diabetes mellitus with complications 48/18/5631   Anemia 11/30/2013   Other autoimmune hemolytic anemias 11/30/2013   Past Medical History:  Diagnosis Date   AICD (automatic cardioverter/defibrillator) present    Arthritis    Cancer ([place] )    prostate  (Radiation only)   Cellulitis    CHF (congestive heart failure) ([place] )    Complication of anesthesia    slow to come out of anesthesia   Coronary artery disease    Diabetes mellitus    Dyspnea    GERD (gastroesophageal reflux disease)    Gout    Hypertension    Kidney stones    Leg swelling    Low back pain    Myocardial infarction ([place]) 12/09/1993   Obese    OSA (obstructive sleep apnea)    Peripheral neuropathy    PTSD (post-traumatic stress disorder)     Past Surgical History:  Procedure  Laterality Date   CARDIAC DEFIBRILLATOR PLACEMENT     COLONOSCOPY WITH PROPOFOL N/A 10/26/2017   Procedure: COLONOSCOPY WITH PROPOFOL;  Surgeon: Wonda Horner, MD;  Location: [facility] ENDOSCOPY;  Service: Endoscopy;  Laterality: N/A;   CORONARY ANGIOPLASTY WITH STENT PLACEMENT  1995    x  3 stents   INTRAVASCULAR PRESSURE WIRE/FFR STUDY N/A 07/22/2020   Procedure: INTRAVASCULAR PRESSURE WIRE/FFR STUDY;  Surgeon: Nelva Bush, MD;  Location: [facility];  Service: Cardiovascular;  Laterality: N/A;  CFX and RCA    JOINT REPLACEMENT  2004   right total knee replacement   LEFT HEART CATH AND CORONARY ANGIOGRAPHY N/A 07/22/2020   Procedure: LEFT HEART CATH AND CORONARY ANGIOGRAPHY;  Surgeon: Nelva Bush, MD;  Location: [place];  Service: Cardiovascular;  Laterality: N/A;   MULTIPLE TOOTH EXTRACTIONS     Total Tooth extraction   PACEMAKER INSERTION     PACEMAKER PLACEMENT      Current Outpatient Medications  Medication Sig Dispense Refill Last Dose   acetaminophen (TYLENOL) 325 MG tablet Take 650 mg by mouth every 6 (six) hours as needed for moderate pain (pain).      albuterol (PROVENTIL HFA;VENTOLIN HFA) 108 (90 BASE) MCG/ACT inhaler Inhale 2 puffs into the lungs every 4 (four) hours as needed for wheezing or shortness of breath. 1 Inhaler 0    allopurinol (ZYLOPRIM) 100 MG tablet Take 100 mg by mouth daily.      amoxicillin-clavulanate (AUGMENTIN) 875-125 MG tablet Take 1 tablet by mouth every 12 (twelve) hours. (Patient not taking: Reported on 07/17/2021) 14 tablet 0    ascorbic acid (VITAMIN C) 500 MG tablet Take 500 mg by mouth daily.      aspirin EC 81 MG tablet Take 81 mg by mouth daily.      atorvastatin (LIPITOR) 80 MG tablet Take 40 mg by mouth at bedtime.       carbidopa-levodopa (SINEMET IR) 25-100 MG tablet Take 1 tablet by mouth in the morning, at noon, and at bedtime.      Cholecalciferol 50 MCG (2000 UT) TABS Take 2,000 Units by mouth daily.      clopidogrel (PLAVIX) 75 MG tablet Take 75 mg by mouth daily.  3    colchicine 0.6 MG tablet TAKE 1 TABLET BY MOUTH DAILY 30 tablet 0    COVID-19 mRNA Vac-TriS, Pfizer, (PFIZER-BIONT COVID-19 VAC-TRIS) SUSP injection Inject into the muscle. (Patient not taking: Reported on 07/17/2021) 0.3 mL 0     dapagliflozin propanediol (FARXIGA) 10 MG TABS tablet Take 1 tablet (10 mg total) by mouth daily before breakfast. (Patient not taking: Reported on 07/17/2021) 30 tablet 2    docusate sodium (COLACE) 100 MG capsule Take 100 mg by mouth 2 (two) times daily.      DULoxetine (CYMBALTA) 60 MG capsule Take 60 mg by mouth daily.       empagliflozin (JARDIANCE) 25 MG TABS tablet Take 25 mg by mouth daily.      ezetimibe (ZETIA) 10 MG tablet Take 10 mg by mouth daily.      Ferrous Sulfate (IRON) 325 (65 Fe) MG TABS Take 325 mg by mouth daily. (Patient not taking: Reported on 07/17/2021)      furosemide (LASIX) 40 MG tablet Take 1 tablet (40 mg total) by mouth daily. Take an extra dose if you gain 2 lbs in 24 hours. 60 tablet 3    gabapentin (NEURONTIN) 400 MG capsule Take 400 mg by mouth 3 (three) times daily.  glucose 4 GM chewable tablet Chew 1 tablet by mouth as needed for low blood sugar.      indomethacin (INDOCIN) 50 MG capsule TAKE 1 CAPSULE(50 MG) BY MOUTH TWICE DAILY WITH A MEAL (Patient not taking: Reported on 07/17/2021) 180 capsule 1    isosorbide mononitrate (IMDUR) 60 MG 24 hr tablet Take 1 tablet (60 mg total) by mouth daily. 30 tablet 2    Magnesium 400 MG TABS Take 400 mg by mouth daily.      methocarbamol (ROBAXIN) 500 MG tablet Take 500 mg by mouth at bedtime.      metoprolol succinate (TOPROL-XL) 100 MG 24 hr tablet Take 100 mg by mouth daily. Take with or immediately following a meal.      Multiple Vitamin (MULTIVITAMIN WITH MINERALS) TABS tablet Take 1 tablet by mouth daily.      nitroGLYCERIN (NITROSTAT) 0.4 MG SL tablet Place 0.4 mg under the tongue every 5 (five) minutes as needed for chest pain.       omeprazole (PRILOSEC) 20 MG capsule Take 20 mg by mouth daily.      oxybutynin (DITROPAN-XL) 5 MG 24 hr tablet Take 5 mg by mouth daily.      polyethylene glycol (MIRALAX / GLYCOLAX) packet Take 17 g by mouth daily as needed (constipation).       potassium chloride (K-DUR,KLOR-CON)  10 MEQ tablet Take 10 mEq by mouth daily.      primidone (MYSOLINE) 50 MG tablet Take 150 mg by mouth daily.      sacubitril-valsartan (ENTRESTO) 49-51 MG Take 1 tablet 2 (two) times daily by mouth.      Semaglutide, 2 MG/DOSE, (OZEMPIC, 2 MG/DOSE,) 8 MG/3ML SOPN Inject 2 mg into the skin once a week (Patient not taking: Reported on 07/17/2021) 3 mL 5    Semaglutide, 2 MG/DOSE, (OZEMPIC, 2 MG/DOSE,) 8 MG/3ML SOPN Inject '2mg'$  into the skin once a week (Patient not taking: Reported on 07/17/2021) 3 mL 3    Semaglutide, 2 MG/DOSE, (OZEMPIC, 2 MG/DOSE,) 8 MG/3ML SOPN Inject 2 mg into the skin once a week 9 mL 3    spironolactone (ALDACTONE) 25 MG tablet Take 25 mg by mouth daily.  0    tamsulosin (FLOMAX) 0.4 MG CAPS capsule Take 0.8 mg by mouth at bedtime.       terbinafine (LAMISIL) 1 % cream Apply 1 application topically daily as needed for itching.      traZODone (DESYREL) 150 MG tablet Take 150 mg by mouth at bedtime.      No current facility-administered medications for this visit.   Allergies  Allergen Reactions   Metformin Diarrhea    Social History   Tobacco Use   Smoking status: Former    Packs/day: 1.00    Years: 20.00    Pack years: 20.00    Types: Cigarettes    Quit date: 10/23/1980    Years since quitting: 40.7   Smokeless tobacco: Never  Substance Use Topics   Alcohol use: No    Alcohol/week: 0.0 standard drinks    Family History  Problem Relation Age of Onset   Heart attack Mother    Hypertension Mother    Diabetes Other    Hypertension Other    Heart disease Other    Arthritis Other    Heart disease Sister    Heart disease Brother      Review of Systems  HENT:  Positive for hearing loss.   Cardiovascular:  Positive for leg swelling.  Gastrointestinal:  Positive for abdominal distention.  Genitourinary:  Positive for difficulty urinating.  Musculoskeletal:  Positive for arthralgias, back pain, gait problem, joint swelling, myalgias and neck pain.    Objective:  Physical Exam Constitutional:      Appearance: Normal appearance. He is obese.  HENT:     Head: Normocephalic and atraumatic.     Nose: Nose normal.     Mouth/Throat:     Mouth: Mucous membranes are moist.     Pharynx: Oropharynx is clear.  Eyes:     Extraocular Movements: Extraocular movements intact.  Cardiovascular:     Rate and Rhythm: Normal rate and regular rhythm.     Pulses: Normal pulses.     Heart sounds: Normal heart sounds.     Comments: Has pacemaker/ defibrillator. Pulmonary:     Effort: Pulmonary effort is normal.     Breath sounds: Normal breath sounds.  Abdominal:     Palpations: Abdomen is soft.  Genitourinary:    Comments: deferred Musculoskeletal:     Cervical back: Normal range of motion and neck supple.     Right lower leg: 1+ Pitting Edema present.     Left lower leg: 1+ Pitting Edema present.     Comments: Examination of the left knee reveals no skin wounds, lesions, rashes, or erythema. Significant pain with palpation over the medial and lateral joint line. Range of motion 12-100. No instability. No extensor lag. Painless range of motion of the hip.  Right knee reveals a well healed incision consistent with TKA.   Skin:    General: Skin is warm and dry.     Capillary Refill: Capillary refill takes less than 2 seconds.  Neurological:     General: No focal deficit present.     Mental Status: He is alert and oriented to person, place, and time.  Psychiatric:        Mood and Affect: Mood normal.        Behavior: Behavior normal.        Thought Content: Thought content normal.        Judgment: Judgment normal.    Vital signs in last 24 hours: '@VSRANGES'$ @  Labs:   Estimated body mass index is 35.49 kg/m as calculated from the following:   Height as of 07/21/21: 5' 6.5" (1.689 m).   Weight as of 07/21/21: 101.2 kg.   Imaging Review Plain radiographs demonstrate severe degenerative joint disease of the left knee(s). The overall  alignment ismild varus. The bone quality appears to be adequate for age and reported activity level.      Assessment/Plan:  End stage arthritis, left knee   The patient history, physical examination, clinical judgment of the provider and imaging studies are consistent with end stage degenerative joint disease of the left knee(s) and total knee arthroplasty is deemed medically necessary. The treatment options including medical management, injection therapy arthroscopy and arthroplasty were discussed at length. The risks and benefits of total knee arthroplasty were presented and reviewed. The risks due to aseptic loosening, infection, stiffness, patella tracking problems, thromboembolic complications and other imponderables were discussed. The patient acknowledged the explanation, agreed to proceed with the plan and consent was signed. Patient is being admitted for inpatient treatment for surgery, pain control, PT, OT, prophylactic antibiotics, VTE prophylaxis, progressive ambulation and ADL's and discharge planning. The patient is planning to be discharged home after an overnight stay with OPPT.   Therapy Plans: outpatient therapy  Disposition: Home with wife Planned DVT Prophylaxis: aspirin  $'81mg'z$  BID and plavix.  DME needed: walker PCP: Cleared Cardiology: Cleared. Stop plavix 5 days prior to surgery.  TXA: Yes Allergies:  - Metformin - diarrhea Anesthesia Concerns: Difficulty waking up in the past.  BMI: 39.4 Last HgbA1c: 6.6 Other: - Sleep apnea, doesn't use CPAP.  - On aspirin and plavix at baseline - CHF with ischemia and reduced EF 30-35.  - Has pacemaker/defibrillator.  - Urinary retention - Diabetes - Chronic pain syndrome. Patient states he has been off narcotics for about 1 year.  - Cr 1.23. - Questionable COPD per patient.  - Hx GI bleed. No NSAIDs. - Chronic gout    Patient's anticipated LOS is less than 2 midnights, meeting these requirements: - Younger than 37 -  Lives within 1 hour of care - Has a competent adult at home to recover with post-op recover - NO history of  - Chronic pain requiring opiods  - Heart attack  - Stroke  - DVT/VTE  - Cardiac arrhythmia  - Respiratory Failure/COPD  - Renal failure  - Anemia  - Advanced Liver disease

## 2021-07-23 NOTE — Anesthesia Preprocedure Evaluation (Addendum)
Anesthesia Evaluation  Patient identified by MRN, date of birth, ID band Patient awake    Reviewed: Allergy & Precautions, NPO status , Patient's Chart, lab work & pertinent test results  History of Anesthesia Complications Negative for: history of anesthetic complications  Airway Mallampati: III  TM Distance: >3 FB Neck ROM: Full    Dental  (+) Dental Advisory Given   Pulmonary shortness of breath, sleep apnea , neg COPD, neg recent URI, former smoker,    breath sounds clear to auscultation       Cardiovascular hypertension, Pt. on medications (-) angina+ CAD, + Past MI, + Cardiac Stents and +CHF  + dysrhythmias + pacemaker + Cardiac Defibrillator  Rhythm:Irregular  1. Mild to moderate, non-obstructive coronary artery disease, as detailed below. 2. Patent LCx stent(s) with mild in-stent restenosis. 3. Upper normal left ventricular filling pressure.  Recommendations: 1. Continue medical therapy and risk factor modification.  Nelva Bush, MD [facility]                   Neuro/Psych PSYCHIATRIC DISORDERS Anxiety Depression  Neuromuscular disease    GI/Hepatic Neg liver ROS, GERD  ,  Endo/Other  diabetesLab Results      Component                Value               Date                      HGBA1C                   6.6 (H)             07/21/2021             Renal/GU CRFRenal disease     Musculoskeletal  (+) Arthritis ,   Abdominal   Peds  Hematology  (+) Blood dyscrasia, anemia , plavix last dose 4 days ago   Anesthesia Other Findings   Reproductive/Obstetrics                            Anesthesia Physical Anesthesia Plan  ASA: 3  Anesthesia Plan: General and Regional   Post-op Pain Management: Regional block*   Induction: Intravenous  PONV Risk Score and Plan: 2 and Ondansetron and Dexamethasone  Airway Management Planned: Oral ETT and LMA  Additional Equipment:  None  Intra-op Plan:   Post-operative Plan: Extubation in OR  Informed Consent: I have reviewed the patients History and Physical, chart, labs and discussed the procedure including the risks, benefits and alternatives for the proposed anesthesia with the patient or authorized representative who has indicated his/her understanding and acceptance.     Dental advisory given  Plan Discussed with: CRNA  Anesthesia Plan Comments: (See APP note by Durel Salts, FNP / J. Ward, PA )      Anesthesia Quick Evaluation

## 2021-07-31 ENCOUNTER — Ambulatory Visit (HOSPITAL_COMMUNITY): Payer: No Typology Code available for payment source | Admitting: Physician Assistant

## 2021-07-31 ENCOUNTER — Ambulatory Visit (HOSPITAL_COMMUNITY)
Admission: RE | Admit: 2021-07-31 | Discharge: 2021-08-01 | Disposition: A | Discharge status: Home / Self Care | Payer: No Typology Code available for payment source | Source: Ambulatory Visit | Attending: Orthopedic Surgery | Admitting: Orthopedic Surgery

## 2021-07-31 ENCOUNTER — Ambulatory Visit (HOSPITAL_COMMUNITY): Payer: No Typology Code available for payment source

## 2021-07-31 ENCOUNTER — Other Ambulatory Visit: Payer: Self-pay

## 2021-07-31 ENCOUNTER — Encounter (HOSPITAL_COMMUNITY): Payer: Self-pay | Admitting: Orthopedic Surgery

## 2021-07-31 ENCOUNTER — Encounter (HOSPITAL_COMMUNITY)
Admission: RE | Disposition: A | Discharge status: Home / Self Care | Payer: Self-pay | Source: Ambulatory Visit | Attending: Orthopedic Surgery

## 2021-07-31 ENCOUNTER — Ambulatory Visit (HOSPITAL_BASED_OUTPATIENT_CLINIC_OR_DEPARTMENT_OTHER): Payer: No Typology Code available for payment source | Admitting: Certified Registered Nurse Anesthetist

## 2021-07-31 DIAGNOSIS — M1712 Unilateral primary osteoarthritis, left knee: Secondary | ICD-10-CM | POA: Insufficient documentation

## 2021-07-31 DIAGNOSIS — Z01818 Encounter for other preprocedural examination: Secondary | ICD-10-CM

## 2021-07-31 DIAGNOSIS — I5042 Chronic combined systolic (congestive) and diastolic (congestive) heart failure: Secondary | ICD-10-CM | POA: Diagnosis not present

## 2021-07-31 DIAGNOSIS — M79605 Pain in left leg: Secondary | ICD-10-CM | POA: Diagnosis not present

## 2021-07-31 DIAGNOSIS — K3184 Gastroparesis: Secondary | ICD-10-CM

## 2021-07-31 DIAGNOSIS — M79604 Pain in right leg: Secondary | ICD-10-CM | POA: Insufficient documentation

## 2021-07-31 DIAGNOSIS — N189 Chronic kidney disease, unspecified: Secondary | ICD-10-CM

## 2021-07-31 DIAGNOSIS — G4733 Obstructive sleep apnea (adult) (pediatric): Secondary | ICD-10-CM | POA: Diagnosis not present

## 2021-07-31 DIAGNOSIS — Z7985 Long-term (current) use of injectable non-insulin antidiabetic drugs: Secondary | ICD-10-CM | POA: Insufficient documentation

## 2021-07-31 DIAGNOSIS — I130 Hypertensive heart and chronic kidney disease with heart failure and stage 1 through stage 4 chronic kidney disease, or unspecified chronic kidney disease: Secondary | ICD-10-CM | POA: Insufficient documentation

## 2021-07-31 DIAGNOSIS — Z955 Presence of coronary angioplasty implant and graft: Secondary | ICD-10-CM | POA: Insufficient documentation

## 2021-07-31 DIAGNOSIS — Z87891 Personal history of nicotine dependence: Secondary | ICD-10-CM

## 2021-07-31 DIAGNOSIS — E1122 Type 2 diabetes mellitus with diabetic chronic kidney disease: Secondary | ICD-10-CM | POA: Diagnosis not present

## 2021-07-31 DIAGNOSIS — K219 Gastro-esophageal reflux disease without esophagitis: Secondary | ICD-10-CM | POA: Insufficient documentation

## 2021-07-31 DIAGNOSIS — Z79899 Other long term (current) drug therapy: Secondary | ICD-10-CM | POA: Diagnosis not present

## 2021-07-31 DIAGNOSIS — I25110 Atherosclerotic heart disease of native coronary artery with unstable angina pectoris: Secondary | ICD-10-CM | POA: Diagnosis not present

## 2021-07-31 DIAGNOSIS — I2510 Atherosclerotic heart disease of native coronary artery without angina pectoris: Secondary | ICD-10-CM

## 2021-07-31 DIAGNOSIS — Z96652 Presence of left artificial knee joint: Secondary | ICD-10-CM

## 2021-07-31 DIAGNOSIS — Z95810 Presence of automatic (implantable) cardiac defibrillator: Secondary | ICD-10-CM | POA: Diagnosis not present

## 2021-07-31 DIAGNOSIS — I252 Old myocardial infarction: Secondary | ICD-10-CM | POA: Diagnosis not present

## 2021-07-31 DIAGNOSIS — Z7984 Long term (current) use of oral hypoglycemic drugs: Secondary | ICD-10-CM | POA: Diagnosis not present

## 2021-07-31 DIAGNOSIS — E1143 Type 2 diabetes mellitus with diabetic autonomic (poly)neuropathy: Secondary | ICD-10-CM

## 2021-07-31 DIAGNOSIS — K7031 Alcoholic cirrhosis of liver with ascites: Secondary | ICD-10-CM

## 2021-07-31 DIAGNOSIS — G894 Chronic pain syndrome: Secondary | ICD-10-CM | POA: Insufficient documentation

## 2021-07-31 HISTORY — PX: KNEE ARTHROPLASTY: SHX992

## 2021-07-31 LAB — GLUCOSE, CAPILLARY
Glucose-Capillary: 117 mg/dL — ABNORMAL HIGH (ref 70–99)
Glucose-Capillary: 126 mg/dL — ABNORMAL HIGH (ref 70–99)
Glucose-Capillary: 119 mg/dL — ABNORMAL HIGH (ref 70–99)
Glucose-Capillary: 137 mg/dL — ABNORMAL HIGH (ref 70–99)

## 2021-07-31 SURGERY — ARTHROPLASTY, KNEE, TOTAL, USING IMAGELESS COMPUTER-ASSISTED NAVIGATION
Anesthesia: Spinal | Site: Knee | Laterality: Left

## 2021-07-31 MED ORDER — ONDANSETRON HCL 4 MG/2ML IJ SOLN
INTRAMUSCULAR | Status: AC
Start: 2021-07-31 — End: ?
  Filled 2021-07-31: qty 2

## 2021-07-31 MED ORDER — POVIDONE-IODINE 10 % EX SWAB
2.0000 "application " | Freq: Once | CUTANEOUS | Status: AC
  Administered 2021-07-31: 2 via TOPICAL

## 2021-07-31 MED ORDER — ETOMIDATE 2 MG/ML IV SOLN
INTRAVENOUS | Status: AC
  Filled 2021-07-31: qty 10

## 2021-07-31 MED ORDER — ISOPROPYL ALCOHOL 70 % SOLN
Status: DC | PRN
  Administered 2021-07-31: 1 via TOPICAL

## 2021-07-31 MED ORDER — DIPHENHYDRAMINE HCL 12.5 MG/5ML PO ELIX: 12.5000 mg | ORAL_SOLUTION | ORAL | Status: DC | PRN

## 2021-07-31 MED ORDER — ONDANSETRON HCL 4 MG/2ML IJ SOLN
4.0000 mg | Freq: Four times a day (QID) | INTRAMUSCULAR | Status: DC | PRN
  Administered 2021-08-01: 4 mg via INTRAVENOUS
  Filled 2021-07-31: qty 2

## 2021-07-31 MED ORDER — ATORVASTATIN CALCIUM 40 MG PO TABS
40.0000 mg | ORAL_TABLET | Freq: Every day | ORAL | Status: DC
  Administered 2021-07-31: 40 mg via ORAL
  Filled 2021-07-31: qty 1

## 2021-07-31 MED ORDER — FENTANYL CITRATE PF 50 MCG/ML IJ SOSY
25.0000 ug | PREFILLED_SYRINGE | INTRAMUSCULAR | Status: DC | PRN
  Administered 2021-07-31: 25 ug via INTRAVENOUS

## 2021-07-31 MED ORDER — ASCORBIC ACID 500 MG PO TABS
500.0000 mg | ORAL_TABLET | Freq: Every day | ORAL | Status: DC
  Administered 2021-08-01: 500 mg via ORAL
  Filled 2021-07-31 (×2): qty 1

## 2021-07-31 MED ORDER — ALBUTEROL SULFATE (2.5 MG/3ML) 0.083% IN NEBU
2.5000 mg | INHALATION_SOLUTION | RESPIRATORY_TRACT | Status: DC | PRN
Start: 2021-07-31 — End: 2021-08-01

## 2021-07-31 MED ORDER — SUGAMMADEX SODIUM 200 MG/2ML IV SOLN
INTRAVENOUS | Status: DC | PRN
  Administered 2021-07-31: 200 mg via INTRAVENOUS

## 2021-07-31 MED ORDER — FENTANYL CITRATE (PF) 100 MCG/2ML IJ SOLN
INTRAMUSCULAR | Status: DC | PRN
  Administered 2021-07-31 (×2): 25 ug via INTRAVENOUS
  Administered 2021-07-31: 100 ug via INTRAVENOUS
  Administered 2021-07-31: 50 ug via INTRAVENOUS

## 2021-07-31 MED ORDER — FENTANYL CITRATE (PF) 100 MCG/2ML IJ SOLN
INTRAMUSCULAR | Status: AC
  Filled 2021-07-31: qty 2

## 2021-07-31 MED ORDER — METOPROLOL SUCCINATE ER 50 MG PO TB24
100.0000 mg | ORAL_TABLET | Freq: Every day | ORAL | Status: DC
  Administered 2021-08-01: 100 mg via ORAL
  Filled 2021-07-31: qty 2

## 2021-07-31 MED ORDER — PHENYLEPHRINE HCL-NACL 20-0.9 MG/250ML-% IV SOLN
INTRAVENOUS | Status: AC
  Filled 2021-07-31: qty 500

## 2021-07-31 MED ORDER — SUCCINYLCHOLINE CHLORIDE 200 MG/10ML IV SOSY
PREFILLED_SYRINGE | INTRAVENOUS | Status: DC | PRN
  Administered 2021-07-31: 140 mg via INTRAVENOUS

## 2021-07-31 MED ORDER — METOCLOPRAMIDE HCL 5 MG PO TABS: 5.0000 mg | ORAL_TABLET | Freq: Three times a day (TID) | ORAL | Status: DC | PRN

## 2021-07-31 MED ORDER — VITAMIN D 25 MCG (1000 UNIT) PO TABS
2000.0000 [IU] | ORAL_TABLET | Freq: Every day | ORAL | Status: DC
  Filled 2021-07-31: qty 2

## 2021-07-31 MED ORDER — INSULIN ASPART 100 UNIT/ML IJ SOLN: 0.0000 [IU] | Freq: Every day | INTRAMUSCULAR | Status: DC

## 2021-07-31 MED ORDER — ADULT MULTIVITAMIN W/MINERALS CH
1.0000 | ORAL_TABLET | Freq: Every day | ORAL | Status: DC
  Administered 2021-08-01: 1 via ORAL
  Filled 2021-07-31: qty 1

## 2021-07-31 MED ORDER — INSULIN ASPART 100 UNIT/ML IJ SOLN: 0.0000 [IU] | Freq: Three times a day (TID) | INTRAMUSCULAR | Status: DC

## 2021-07-31 MED ORDER — ASPIRIN 81 MG PO CHEW
81.0000 mg | CHEWABLE_TABLET | Freq: Two times a day (BID) | ORAL | Status: DC
  Administered 2021-07-31 – 2021-08-01 (×2): 81 mg via ORAL
  Filled 2021-07-31 (×3): qty 1

## 2021-07-31 MED ORDER — SENNA 8.6 MG PO TABS
1.0000 | ORAL_TABLET | Freq: Two times a day (BID) | ORAL | Status: DC
  Administered 2021-07-31 – 2021-08-01 (×2): 8.6 mg via ORAL
  Filled 2021-07-31 (×2): qty 1

## 2021-07-31 MED ORDER — SODIUM CHLORIDE 0.9 % IR SOLN
Status: DC | PRN
  Administered 2021-07-31: 1000 mL

## 2021-07-31 MED ORDER — MAGNESIUM OXIDE -MG SUPPLEMENT 400 (240 MG) MG PO TABS
400.0000 mg | ORAL_TABLET | Freq: Every day | ORAL | Status: DC
  Administered 2021-08-01: 400 mg via ORAL
  Filled 2021-07-31: qty 1

## 2021-07-31 MED ORDER — LACTATED RINGERS IV SOLN: INTRAVENOUS | Status: DC

## 2021-07-31 MED ORDER — PHENYLEPHRINE HCL-NACL 20-0.9 MG/250ML-% IV SOLN
INTRAVENOUS | Status: DC | PRN
  Administered 2021-07-31: 50 ug/min via INTRAVENOUS

## 2021-07-31 MED ORDER — SODIUM CHLORIDE (PF) 0.9 % IJ SOLN
INTRAMUSCULAR | Status: AC
  Filled 2021-07-31: qty 30

## 2021-07-31 MED ORDER — KETOROLAC TROMETHAMINE 30 MG/ML IJ SOLN
INTRAMUSCULAR | Status: AC
Start: 2021-07-31 — End: ?
  Filled 2021-07-31: qty 1

## 2021-07-31 MED ORDER — CHLORHEXIDINE GLUCONATE 0.12 % MT SOLN
15.0000 mL | Freq: Once | OROMUCOSAL | Status: AC
  Administered 2021-07-31: 15 mL via OROMUCOSAL

## 2021-07-31 MED ORDER — CELECOXIB 200 MG PO CAPS
200.0000 mg | ORAL_CAPSULE | Freq: Two times a day (BID) | ORAL | Status: DC
  Administered 2021-07-31 – 2021-08-01 (×3): 200 mg via ORAL
  Filled 2021-07-31 (×3): qty 1

## 2021-07-31 MED ORDER — SODIUM CHLORIDE (PF) 0.9 % IJ SOLN
INTRAMUSCULAR | Status: DC | PRN
  Administered 2021-07-31: 30 mL

## 2021-07-31 MED ORDER — ACETAMINOPHEN 325 MG PO TABS: 325.0000 mg | ORAL_TABLET | Freq: Four times a day (QID) | ORAL | Status: DC | PRN

## 2021-07-31 MED ORDER — TRANEXAMIC ACID-NACL 1000-0.7 MG/100ML-% IV SOLN
1000.0000 mg | INTRAVENOUS | Status: AC
  Administered 2021-07-31: 1000 mg via INTRAVENOUS
  Filled 2021-07-31: qty 100

## 2021-07-31 MED ORDER — CEFAZOLIN SODIUM-DEXTROSE 2-4 GM/100ML-% IV SOLN
2.0000 g | Freq: Four times a day (QID) | INTRAVENOUS | Status: AC
  Administered 2021-07-31 (×2): 2 g via INTRAVENOUS
  Filled 2021-07-31 (×2): qty 100

## 2021-07-31 MED ORDER — ETOMIDATE 2 MG/ML IV SOLN
INTRAVENOUS | Status: DC | PRN
  Administered 2021-07-31: 10 mg via INTRAVENOUS

## 2021-07-31 MED ORDER — PHENYLEPHRINE 80 MCG/ML (10ML) SYRINGE FOR IV PUSH (FOR BLOOD PRESSURE SUPPORT)
PREFILLED_SYRINGE | INTRAVENOUS | Status: DC | PRN
  Administered 2021-07-31 (×3): 80 ug via INTRAVENOUS

## 2021-07-31 MED ORDER — OXYBUTYNIN CHLORIDE ER 5 MG PO TB24
5.0000 mg | ORAL_TABLET | Freq: Every day | ORAL | Status: DC
  Administered 2021-08-01: 5 mg via ORAL
  Filled 2021-07-31: qty 1

## 2021-07-31 MED ORDER — PROPOFOL 10 MG/ML IV BOLUS
INTRAVENOUS | Status: AC
  Filled 2021-07-31: qty 20

## 2021-07-31 MED ORDER — METHOCARBAMOL 500 MG IVPB - SIMPLE MED: 500.0000 mg | Freq: Four times a day (QID) | INTRAVENOUS | Status: DC | PRN

## 2021-07-31 MED ORDER — DEXAMETHASONE SODIUM PHOSPHATE 10 MG/ML IJ SOLN
INTRAMUSCULAR | Status: AC
  Filled 2021-07-31: qty 1

## 2021-07-31 MED ORDER — PHENOL 1.4 % MT LIQD
1.0000 | OROMUCOSAL | Status: DC | PRN
Start: 2021-07-31 — End: 2021-08-01

## 2021-07-31 MED ORDER — PRIMIDONE 50 MG PO TABS
150.0000 mg | ORAL_TABLET | Freq: Every day | ORAL | Status: DC
  Administered 2021-08-01: 150 mg via ORAL
  Filled 2021-07-31: qty 3

## 2021-07-31 MED ORDER — BUPIVACAINE-EPINEPHRINE 0.25% -1:200000 IJ SOLN
INTRAMUSCULAR | Status: DC | PRN
  Administered 2021-07-31: 30 mL

## 2021-07-31 MED ORDER — BUPIVACAINE HCL (PF) 0.75 % IJ SOLN
INTRAMUSCULAR | Status: DC | PRN
  Administered 2021-07-31: 20 mL via PERINEURAL

## 2021-07-31 MED ORDER — METHOCARBAMOL 500 MG PO TABS: 500.0000 mg | ORAL_TABLET | Freq: Four times a day (QID) | ORAL | Status: DC | PRN

## 2021-07-31 MED ORDER — COLCHICINE 0.6 MG PO TABS
0.6000 mg | ORAL_TABLET | Freq: Every day | ORAL | Status: DC
  Administered 2021-08-01: 0.6 mg via ORAL
  Filled 2021-07-31: qty 1

## 2021-07-31 MED ORDER — DOCUSATE SODIUM 100 MG PO CAPS
100.0000 mg | ORAL_CAPSULE | Freq: Two times a day (BID) | ORAL | Status: DC
  Administered 2021-07-31 – 2021-08-01 (×2): 100 mg via ORAL
  Filled 2021-07-31 (×2): qty 1

## 2021-07-31 MED ORDER — NITROGLYCERIN 0.4 MG SL SUBL
0.4000 mg | SUBLINGUAL_TABLET | SUBLINGUAL | Status: DC | PRN
Start: 2021-07-31 — End: 2021-08-01

## 2021-07-31 MED ORDER — ALUM & MAG HYDROXIDE-SIMETH 200-200-20 MG/5ML PO SUSP: 30.0000 mL | ORAL | Status: DC | PRN

## 2021-07-31 MED ORDER — SUCCINYLCHOLINE CHLORIDE 200 MG/10ML IV SOSY
PREFILLED_SYRINGE | INTRAVENOUS | Status: AC
  Filled 2021-07-31: qty 10

## 2021-07-31 MED ORDER — CLOPIDOGREL BISULFATE 75 MG PO TABS
75.0000 mg | ORAL_TABLET | Freq: Every day | ORAL | Status: DC
  Administered 2021-08-01: 75 mg via ORAL
  Filled 2021-07-31: qty 1

## 2021-07-31 MED ORDER — ROCURONIUM BROMIDE 10 MG/ML (PF) SYRINGE
PREFILLED_SYRINGE | INTRAVENOUS | Status: DC | PRN
  Administered 2021-07-31: 40 mg via INTRAVENOUS

## 2021-07-31 MED ORDER — TAMSULOSIN HCL 0.4 MG PO CAPS
0.8000 mg | ORAL_CAPSULE | Freq: Every day | ORAL | Status: DC
  Administered 2021-07-31: 0.8 mg via ORAL
  Filled 2021-07-31: qty 2

## 2021-07-31 MED ORDER — DEXAMETHASONE SODIUM PHOSPHATE 10 MG/ML IJ SOLN
INTRAMUSCULAR | Status: DC | PRN
  Administered 2021-07-31: 5 mg via INTRAVENOUS

## 2021-07-31 MED ORDER — OXYCODONE HCL 5 MG PO TABS: 5.0000 mg | ORAL_TABLET | Freq: Once | ORAL | Status: DC | PRN

## 2021-07-31 MED ORDER — HYDROCODONE-ACETAMINOPHEN 5-325 MG PO TABS: 1.0000 | ORAL_TABLET | ORAL | Status: DC | PRN

## 2021-07-31 MED ORDER — HYDROCODONE-ACETAMINOPHEN 7.5-325 MG PO TABS
1.0000 | ORAL_TABLET | ORAL | Status: DC | PRN
  Administered 2021-07-31 – 2021-08-01 (×4): 2 via ORAL
  Filled 2021-07-31 (×4): qty 2

## 2021-07-31 MED ORDER — GABAPENTIN 400 MG PO CAPS
400.0000 mg | ORAL_CAPSULE | Freq: Three times a day (TID) | ORAL | Status: DC
  Administered 2021-07-31 – 2021-08-01 (×3): 400 mg via ORAL
  Filled 2021-07-31 (×3): qty 1

## 2021-07-31 MED ORDER — PROPOFOL 500 MG/50ML IV EMUL
INTRAVENOUS | Status: DC | PRN
  Administered 2021-07-31: 75 ug/kg/min via INTRAVENOUS

## 2021-07-31 MED ORDER — OXYCODONE HCL 5 MG/5ML PO SOLN: 5.0000 mg | Freq: Once | ORAL | Status: DC | PRN

## 2021-07-31 MED ORDER — SODIUM CHLORIDE 0.9 % IV SOLN: INTRAVENOUS | Status: DC

## 2021-07-31 MED ORDER — SODIUM CHLORIDE 0.9% IV SOLUTION
INTRAVENOUS | Status: AC | PRN
  Administered 2021-07-31: 1000 mL

## 2021-07-31 MED ORDER — ORAL CARE MOUTH RINSE: 15.0000 mL | Freq: Once | OROMUCOSAL | Status: AC

## 2021-07-31 MED ORDER — SPIRONOLACTONE 25 MG PO TABS
25.0000 mg | ORAL_TABLET | Freq: Every day | ORAL | Status: DC
  Administered 2021-08-01: 25 mg via ORAL
  Filled 2021-07-31: qty 1

## 2021-07-31 MED ORDER — PANTOPRAZOLE SODIUM 40 MG PO TBEC
40.0000 mg | DELAYED_RELEASE_TABLET | Freq: Every day | ORAL | Status: DC
  Administered 2021-08-01: 40 mg via ORAL
  Filled 2021-07-31 (×2): qty 1

## 2021-07-31 MED ORDER — EZETIMIBE 10 MG PO TABS
10.0000 mg | ORAL_TABLET | Freq: Every day | ORAL | Status: DC
  Administered 2021-08-01: 10 mg via ORAL
  Filled 2021-07-31 (×2): qty 1

## 2021-07-31 MED ORDER — EMPAGLIFLOZIN 25 MG PO TABS
25.0000 mg | ORAL_TABLET | Freq: Every day | ORAL | Status: DC
  Administered 2021-08-01: 25 mg via ORAL
  Filled 2021-07-31: qty 1

## 2021-07-31 MED ORDER — FUROSEMIDE 40 MG PO TABS
40.0000 mg | ORAL_TABLET | Freq: Every day | ORAL | Status: DC
  Administered 2021-08-01: 40 mg via ORAL
  Filled 2021-07-31: qty 1

## 2021-07-31 MED ORDER — ISOPROPYL ALCOHOL 70 % SOLN
Status: AC
  Filled 2021-07-31: qty 480

## 2021-07-31 MED ORDER — ALLOPURINOL 100 MG PO TABS
100.0000 mg | ORAL_TABLET | Freq: Every day | ORAL | Status: DC
  Administered 2021-08-01: 100 mg via ORAL
  Filled 2021-07-31: qty 1

## 2021-07-31 MED ORDER — ONDANSETRON HCL 4 MG PO TABS: 4.0000 mg | ORAL_TABLET | Freq: Four times a day (QID) | ORAL | Status: DC | PRN

## 2021-07-31 MED ORDER — ACETAMINOPHEN 500 MG PO TABS
1000.0000 mg | ORAL_TABLET | Freq: Once | ORAL | Status: AC
  Administered 2021-07-31: 1000 mg via ORAL
  Filled 2021-07-31: qty 2

## 2021-07-31 MED ORDER — TRAZODONE HCL 50 MG PO TABS
150.0000 mg | ORAL_TABLET | Freq: Every day | ORAL | Status: DC
  Administered 2021-07-31: 150 mg via ORAL
  Filled 2021-07-31: qty 1

## 2021-07-31 MED ORDER — ISOSORBIDE MONONITRATE ER 60 MG PO TB24
60.0000 mg | ORAL_TABLET | Freq: Every day | ORAL | Status: DC
  Administered 2021-08-01: 60 mg via ORAL
  Filled 2021-07-31: qty 1

## 2021-07-31 MED ORDER — METOCLOPRAMIDE HCL 5 MG/ML IJ SOLN: 5.0000 mg | Freq: Three times a day (TID) | INTRAMUSCULAR | Status: DC | PRN

## 2021-07-31 MED ORDER — ONDANSETRON HCL 4 MG/2ML IJ SOLN
INTRAMUSCULAR | Status: DC | PRN
  Administered 2021-07-31: 4 mg via INTRAVENOUS

## 2021-07-31 MED ORDER — ALBUTEROL SULFATE HFA 108 (90 BASE) MCG/ACT IN AERS: 2.0000 | INHALATION_SPRAY | RESPIRATORY_TRACT | Status: DC | PRN

## 2021-07-31 MED ORDER — MORPHINE SULFATE (PF) 2 MG/ML IV SOLN: 0.5000 mg | INTRAVENOUS | Status: DC | PRN

## 2021-07-31 MED ORDER — STERILE WATER FOR IRRIGATION IR SOLN
Status: DC | PRN
  Administered 2021-07-31: 2000 mL

## 2021-07-31 MED ORDER — FENTANYL CITRATE PF 50 MCG/ML IJ SOSY
PREFILLED_SYRINGE | INTRAMUSCULAR | Status: AC
  Filled 2021-07-31: qty 1

## 2021-07-31 MED ORDER — PROPOFOL 1000 MG/100ML IV EMUL
INTRAVENOUS | Status: AC
  Filled 2021-07-31: qty 200

## 2021-07-31 MED ORDER — SACUBITRIL-VALSARTAN 49-51 MG PO TABS
1.0000 | ORAL_TABLET | Freq: Two times a day (BID) | ORAL | Status: DC
  Administered 2021-08-01: 1 via ORAL
  Filled 2021-07-31: qty 1

## 2021-07-31 MED ORDER — DULOXETINE HCL 60 MG PO CPEP
60.0000 mg | ORAL_CAPSULE | Freq: Every day | ORAL | Status: DC
  Administered 2021-08-01: 60 mg via ORAL
  Filled 2021-07-31: qty 1

## 2021-07-31 MED ORDER — CEFAZOLIN SODIUM-DEXTROSE 2-4 GM/100ML-% IV SOLN
2.0000 g | INTRAVENOUS | Status: AC
  Administered 2021-07-31: 2 g via INTRAVENOUS
  Filled 2021-07-31: qty 100

## 2021-07-31 MED ORDER — KETOROLAC TROMETHAMINE 30 MG/ML IJ SOLN
INTRAMUSCULAR | Status: DC | PRN
  Administered 2021-07-31: 30 mg via INTRAMUSCULAR

## 2021-07-31 MED ORDER — MENTHOL 3 MG MT LOZG: 1.0000 | LOZENGE | OROMUCOSAL | Status: DC | PRN

## 2021-07-31 MED ORDER — CARBIDOPA-LEVODOPA 25-100 MG PO TABS
1.0000 | ORAL_TABLET | Freq: Three times a day (TID) | ORAL | Status: DC
  Administered 2021-07-31 – 2021-08-01 (×3): 1 via ORAL
  Filled 2021-07-31 (×3): qty 1

## 2021-07-31 MED ORDER — BUPIVACAINE-EPINEPHRINE (PF) 0.25% -1:200000 IJ SOLN
INTRAMUSCULAR | Status: AC
  Filled 2021-07-31: qty 30

## 2021-07-31 MED ORDER — POLYETHYLENE GLYCOL 3350 17 G PO PACK: 17.0000 g | PACK | Freq: Every day | ORAL | Status: DC | PRN

## 2021-07-31 MED ORDER — ROCURONIUM BROMIDE 10 MG/ML (PF) SYRINGE
PREFILLED_SYRINGE | INTRAVENOUS | Status: AC
  Filled 2021-07-31: qty 10

## 2021-07-31 MED ORDER — POVIDONE-IODINE 10 % EX SWAB: 2.0000 "application " | Freq: Once | CUTANEOUS | Status: AC

## 2021-07-31 MED ORDER — ORAL CARE MOUTH RINSE: 15.0000 mL | OROMUCOSAL | Status: DC | PRN

## 2021-07-31 SURGICAL SUPPLY — 76 items
BAG COUNTER SPONGE SURGICOUNT (BAG) ×1 IMPLANT
BAG ZIPLOCK 12X15 (MISCELLANEOUS) ×1 IMPLANT
BATTERY INSTRU NAVIGATION (MISCELLANEOUS) ×6 IMPLANT
BLADE SAW RECIPROCATING 77.5 (BLADE) ×2 IMPLANT
BNDG ELASTIC 4X5.8 VLCR STR LF (GAUZE/BANDAGES/DRESSINGS) ×2 IMPLANT
BNDG ELASTIC 6X10 VLCR STRL LF (GAUZE/BANDAGES/DRESSINGS) ×1 IMPLANT
BNDG ELASTIC 6X5.8 VLCR STR LF (GAUZE/BANDAGES/DRESSINGS) ×2 IMPLANT
CHLORAPREP W/TINT 26 (MISCELLANEOUS) ×4 IMPLANT
COMP FEM PS STD 11 LT (Joint) ×2 IMPLANT
COMP TIB KNEE H 0D LT (Joint) ×2 IMPLANT
COMPONENT FEM PS STD 11 LT (Joint) IMPLANT
COMPONENT TIB KNEE H 0D LT (Joint) IMPLANT
COVER SURGICAL LIGHT HANDLE (MISCELLANEOUS) ×2 IMPLANT
DERMABOND ADVANCED (GAUZE/BANDAGES/DRESSINGS) ×1
DERMABOND ADVANCED .7 DNX12 (GAUZE/BANDAGES/DRESSINGS) ×2 IMPLANT
DRAPE INCISE IOBAN 66X45 STRL (DRAPES) ×2 IMPLANT
DRAPE SHEET LG 3/4 BI-LAMINATE (DRAPES) ×6 IMPLANT
DRAPE U-SHAPE 47X51 STRL (DRAPES) ×2 IMPLANT
DRSG AQUACEL AG ADV 3.5X10 (GAUZE/BANDAGES/DRESSINGS) ×1 IMPLANT
DRSG AQUACEL AG ADV 3.5X14 (GAUZE/BANDAGES/DRESSINGS) ×2 IMPLANT
ELECT BLADE TIP CTD 4 INCH (ELECTRODE) ×2 IMPLANT
ELECT REM PT RETURN 15FT ADLT (MISCELLANEOUS) ×2 IMPLANT
GAUZE SPONGE 4X4 12PLY STRL (GAUZE/BANDAGES/DRESSINGS) ×2 IMPLANT
GLOVE BIO SURGEON STRL SZ7 (GLOVE) ×3 IMPLANT
GLOVE BIO SURGEON STRL SZ8.5 (GLOVE) ×6 IMPLANT
GLOVE BIOGEL M 7.0 STRL (GLOVE) ×2 IMPLANT
GLOVE BIOGEL PI IND STRL 6.5 (GLOVE) IMPLANT
GLOVE BIOGEL PI IND STRL 7.5 (GLOVE) ×1 IMPLANT
GLOVE BIOGEL PI IND STRL 8 (GLOVE) ×2 IMPLANT
GLOVE BIOGEL PI IND STRL 8.5 (GLOVE) ×1 IMPLANT
GLOVE BIOGEL PI INDICATOR 6.5 (GLOVE) ×1
GLOVE BIOGEL PI INDICATOR 7.5 (GLOVE) ×1
GLOVE BIOGEL PI INDICATOR 8 (GLOVE) ×4
GLOVE BIOGEL PI INDICATOR 8.5 (GLOVE) ×1
GLOVE SURG LX 7.5 STRW (GLOVE) ×3
GLOVE SURG LX STRL 7.5 STRW (GLOVE) ×3 IMPLANT
GOWN SPEC L3 XXLG W/TWL (GOWN DISPOSABLE) ×4 IMPLANT
HANDPIECE INTERPULSE COAX TIP (DISPOSABLE) ×1
HDLS TROCR DRIL PIN KNEE 75 (PIN) ×4
HOLDER FOLEY CATH W/STRAP (MISCELLANEOUS) ×2 IMPLANT
HOOD PEEL AWAY FLYTE STAYCOOL (MISCELLANEOUS) ×6 IMPLANT
INSERT ARTISURF S8-11 18X22X14 (Insert) ×1 IMPLANT
INSERT ARTISURF SZ 8-11 16 (Insert) ×1 IMPLANT
KIT TURNOVER KIT A (KITS) ×1 IMPLANT
MARKER SKIN DUAL TIP RULER LAB (MISCELLANEOUS) ×2 IMPLANT
NDL SAFETY ECLIPSE 18X1.5 (NEEDLE) ×1 IMPLANT
NDL SPNL 18GX3.5 QUINCKE PK (NEEDLE) ×1 IMPLANT
NEEDLE HYPO 18GX1.5 SHARP (NEEDLE) ×1
NEEDLE SPNL 18GX3.5 QUINCKE PK (NEEDLE) ×2 IMPLANT
NS IRRIG 1000ML POUR BTL (IV SOLUTION) ×2 IMPLANT
PACK TOTAL KNEE CUSTOM (KITS) ×2 IMPLANT
PADDING CAST COTTON 6X4 STRL (CAST SUPPLIES) ×2 IMPLANT
PATELLA STD SZ 38X10 (Miscellaneous) ×1 IMPLANT
PIN DRILL HDLS TROCAR 75 4PK (PIN) IMPLANT
PROTECTOR NERVE ULNAR (MISCELLANEOUS) ×2 IMPLANT
SAW OSC TIP CART 19.5X105X1.3 (SAW) ×2 IMPLANT
SCREW FEMALE HEX FIX 25X2.5 (ORTHOPEDIC DISPOSABLE SUPPLIES) ×1 IMPLANT
SEALER BIPOLAR AQUA 6.0 (INSTRUMENTS) ×2 IMPLANT
SET HNDPC FAN SPRY TIP SCT (DISPOSABLE) ×1 IMPLANT
SET PAD KNEE POSITIONER (MISCELLANEOUS) ×2 IMPLANT
SOLUTION PRONTOSAN WOUND 350ML (IRRIGATION / IRRIGATOR) ×1 IMPLANT
SPIKE FLUID TRANSFER (MISCELLANEOUS) ×4 IMPLANT
SUT MNCRL AB 3-0 PS2 18 (SUTURE) ×2 IMPLANT
SUT MNCRL AB 4-0 PS2 18 (SUTURE) ×2 IMPLANT
SUT MON AB 2-0 CT1 36 (SUTURE) ×2 IMPLANT
SUT STRATAFIX PDO 1 14 VIOLET (SUTURE) ×1
SUT STRATFX PDO 1 14 VIOLET (SUTURE) ×1
SUT VIC AB 1 CTX 36 (SUTURE) ×2
SUT VIC AB 1 CTX36XBRD ANBCTR (SUTURE) ×2 IMPLANT
SUT VIC AB 2-0 CT1 27 (SUTURE) ×1
SUT VIC AB 2-0 CT1 TAPERPNT 27 (SUTURE) ×1 IMPLANT
SUTURE STRATFX PDO 1 14 VIOLET (SUTURE) ×1 IMPLANT
TRAY FOLEY MTR SLVR 16FR STAT (SET/KITS/TRAYS/PACK) ×1 IMPLANT
TUBE SUCTION HIGH CAP CLEAR NV (SUCTIONS) ×2 IMPLANT
WATER STERILE IRR 1000ML POUR (IV SOLUTION) ×4 IMPLANT
WRAP KNEE MAXI GEL POST OP (GAUZE/BANDAGES/DRESSINGS) ×1 IMPLANT

## 2021-07-31 NOTE — Anesthesia Procedure Notes (Addendum)
Procedure Name: Intubation Date/Time: 07/31/2021 7:59 AM  Performed by: West Pugh, CRNAPre-anesthesia Checklist: Patient identified, Emergency Drugs available, Suction available, Patient being monitored and Timeout performed Patient Re-evaluated:Patient Re-evaluated prior to induction Oxygen Delivery Method: Circle system utilized Preoxygenation: Pre-oxygenation with 100% oxygen Induction Type: IV induction Ventilation: Mask ventilation without difficulty Laryngoscope Size: Mac and 3 Grade View: Grade I Tube type: Oral Tube size: 7.0 mm Number of attempts: 1 Airway Equipment and Method: Stylet Placement Confirmation: ETT inserted through vocal cords under direct vision, positive ETCO2, CO2 detector and breath sounds checked- equal and bilateral Secured at: 22 cm Tube secured with: Tape Dental Injury: Teeth and Oropharynx as per pre-operative assessment

## 2021-07-31 NOTE — Anesthesia Procedure Notes (Signed)
Anesthesia Regional Block: Adductor canal block   Pre-Anesthetic Checklist: , timeout performed,  Correct Patient, Correct Site, Correct Laterality,  Correct Procedure, Correct Position, site marked,  Risks and benefits discussed,  Surgical consent,  Pre-op evaluation,  At surgeon's request and post-op pain management  Laterality: Left and Lower  Prep: chloraprep       Needles:  Injection technique: Single-shot  Needle Type: Echogenic Stimulator Needle     Needle Length: 9cm  Needle Gauge: 22     Additional Needles: Arrow StimuQuik ECHO Echogenic Stimulating PNB Needle  Procedures:,,,, ultrasound used (permanent image in chart),,    Narrative:  Start time: 07/31/2021 8:00 AM End time: 07/31/2021 8:05 AM Injection made incrementally with aspirations every 5 mL.  Performed by: Personally  Anesthesiologist: Oleta Mouse, MD

## 2021-07-31 NOTE — Op Note (Signed)
OPERATIVE REPORT  SURGEON: Rod Can, MD   ASSISTANT: Larene Pickett, PA-C  PREOPERATIVE DIAGNOSIS: Primary Left knee arthritis.   POSTOPERATIVE DIAGNOSIS: Primary Left knee arthritis.   PROCEDURE: Computer assisted Left total knee arthroplasty.   IMPLANTS: Zimmer Persona PPS Cementless CR femur, size 11. Persona 0 degree Spiked Keel OsseoTi Tibia, size H. Vivacit-E polyethelyene insert, size 16 mm, MC. TM standard patella, size 38 mm.  ANESTHESIA:  General and Regional  TOURNIQUET TIME: Not utilized.   ESTIMATED BLOOD LOSS:-250 mL    ANTIBIOTICS: 2 g Ancef.  DRAINS: None.  COMPLICATIONS: None   CONDITION: PACU - hemodynamically stable.   BRIEF CLINICAL NOTE: Sincere Liuzzi. is a 76 y.o. male with a long-standing history of Left knee arthritis. After failing conservative management, the patient was indicated for total knee arthroplasty. The risks, benefits, and alternatives to the procedure were explained, and the patient elected to proceed.  PROCEDURE IN DETAIL: Adductor canal block was obtained in the pre-op holding area. Once inside the operative room, general anesthesia was obtained, and a foley catheter was inserted. The patient was then positioned and the lower extremity was prepped and draped in the normal sterile surgical fashion.  A time-out was called verifying side and site of surgery. The patient received IV antibiotics within 60 minutes of beginning the procedure. A tourniquet was not utilized.   An anterior approach to the knee was performed utilizing a midvastus arthrotomy. A medial release was performed and the patellar fat pad was excised. Stryker imageless navigation was used to cut the distal femur perpendicular to the mechanical axis. A freehand patellar resection was performed, and the patella was sized an prepared with 3 lug holes.  Nagivation was used to make a neutral proximal tibia resection, taking 9 mm of bone from the less affected lateral side with  3 degrees of slope. The menisci were excised. A spacer block was placed, and the alignment and balance in extension were confirmed.   The distal femur was sized using the 3-degree external rotation guide referencing the posterior femoral cortex. The appropriate 4-in-1 cutting block was pinned into place. Rotation was checked using Whiteside's line, the epicondylar axis, and then confirmed with a spacer block in flexion. The remaining femoral cuts were performed, taking care to protect the MCL.  The tibia was sized and the trial tray was pinned into place. The remaining trail components were inserted. The knee was stable to varus and valgus stress through a full range of motion. The patella tracked centrally. The PCL was attenuated, so MC poly was utilized. The trial components were removed, and the proximal tibial surface was prepared. Final components were impacted into place. The knee was tested for a final time and found to be well balanced.   The wound was copiously irrigated with Irrisept solution and normal saline using pule lavage.  Marcaine solution was injected into the periarticular soft tissue.  The wound was closed in layers using #1 Vicryl and Stratafix for the fascia, 2-0 Vicryl for the subcutaneous fat, 2-0 Monocryl for the deep dermal layer, 3-0 running Monocryl subcuticular Stitch, and 4-0 Monocryl stay sutures at both ends of the wound. Dermabond was applied to the skin.  Once the glue was fully dried, an Aquacell Ag and compressive dressing were applied.  The patient was transported to the recovery room in stable condition.  Sponge, needle, and instrument counts were correct at the end of the case x2.  The patient tolerated the procedure well and there  were no known complications.  Please note that a surgical assistant was a medical necessity for this procedure in order to perform it in a safe and expeditious manner. Surgical assistant was necessary to retract the ligaments and vital  neurovascular structures to prevent injury to them and also necessary for proper positioning of the limb to allow for anatomic placement of the prosthesis.

## 2021-07-31 NOTE — Transfer of Care (Signed)
Immediate Anesthesia Transfer of Care Note  Patient: Johnny Ochoa.  Procedure(s) Performed: COMPUTER ASSISTED TOTAL KNEE ARTHROPLASTY (Left: Knee)  Patient Location: PACU  Anesthesia Type:GA combined with regional for post-op pain  Level of Consciousness: awake, drowsy and patient cooperative  Airway & Oxygen Therapy: Patient Spontanous Breathing and Patient connected to face mask oxygen  Post-op Assessment: Report given to RN and Post -op Vital signs reviewed and stable  Post vital signs: Reviewed and stable  Last Vitals:  Vitals Value Taken Time  BP 92/58 07/31/21 1115  Temp    Pulse 76 07/31/21 1116  Resp 16 07/31/21 1116  SpO2 100 % 07/31/21 1116  Vitals shown include unvalidated device data.  Last Pain:  Vitals:   07/31/21 0624  TempSrc:   PainSc: 8          Complications: No notable events documented.

## 2021-07-31 NOTE — Evaluation (Signed)
Physical Therapy Evaluation Patient Details Name: Johnny Ochoa. MRN: [id number] DOB: 02-15-1946 Today's Date: 07/31/2021  History of Present Illness  Pt is a 76yo male presenting s/p L-TKA on 07/31/21. PMH: AICD & pacemaker, OA, hx of prostate cancer, CHF, CAD, DM, GERD, gout, HTN, hx of MI, peripheral neuropathy, PTSD, R-TKA 2004.  Clinical Impression  Johnny Ochoa. is a 76 y.o. male POD 0 s/p L-TKA. Patient reports modified independence using single Lofstrand crutch for mobility at baseline. Patient is now limited by functional impairments (see PT problem list below) and requires min guard for transfers, sidesteps EOB. Further mobility deferred secondary to lethargy and fatigue from general anesthesia. Patient instructed in exercise to facilitate ROM and circulation to manage edema. Patient will benefit from continued skilled PT interventions to address impairments and progress towards PLOF. Acute PT will follow to progress mobility and HEP in preparation for safe discharge home.       Recommendations for follow up therapy are one component of a multi-disciplinary discharge planning process, led by the attending physician.  Recommendations may be updated based on patient status, additional functional criteria and insurance authorization.  Follow Up Recommendations Follow physician's recommendations for discharge plan and follow up therapies    Assistance Recommended at Discharge Intermittent Supervision/Assistance  Patient can return home with the following  A little help with walking and/or transfers;A little help with bathing/dressing/bathroom;Assistance with cooking/housework;Assist for transportation;Help with stairs or ramp for entrance    Equipment Recommendations Rolling walker (2 wheels)  Recommendations for Other Services       Functional Status Assessment Patient has had a recent decline in their functional status and demonstrates the ability to make significant  improvements in function in a reasonable and predictable amount of time.     Precautions / Restrictions Precautions Precautions: Fall Restrictions Weight Bearing Restrictions: No      Mobility  Bed Mobility Overal bed mobility: Needs Assistance Bed Mobility: Supine to Sit, Sit to Supine     Supine to sit: Supervision Sit to supine: Min guard   General bed mobility comments: for safety only, no physical assist required    Transfers Overall transfer level: Needs assistance Equipment used: Rolling walker (2 wheels) Transfers: Sit to/from Stand Sit to Stand: From elevated surface, Min guard           General transfer comment: Pt completed sit to stand transfer from elevated surface with min guard, no physical assist required. Pt completed sidesteps at EOB to adjust supine positioning, required VCs for sequencing and min guard for safety, no overt LOB noted, VSS. Further mobility defered secondary to fatigue and lethargy from general anesthesthia    Ambulation/Gait               General Gait Details: deferred  Stairs            Wheelchair Mobility    Modified Rankin (Stroke Patients Only)       Balance Overall balance assessment: Needs assistance Sitting-balance support: Feet supported, No upper extremity supported Sitting balance-Leahy Scale: Normal     Standing balance support: Reliant on assistive device for balance, During functional activity, Bilateral upper extremity supported Standing balance-Leahy Scale: Poor                               Pertinent Vitals/Pain Pain Assessment Pain Assessment: 0-10 Pain Score: 8  Pain Location: left knee Pain Descriptors / Indicators: Operative site  guarding, Discomfort Pain Intervention(s): Limited activity within patient's tolerance, Monitored during session, Repositioned    Home Living Family/patient expects to be discharged to:: Private residence Living Arrangements: Spouse/significant  other Available Help at Discharge: Family;Available 24 hours/day Type of Home: House Home Access: Elevator       Home Layout: Two level (has chair lift) Home Equipment: Crutches;Rollator (4 wheels);Grab bars - tub/shower (forearm crutches)      Prior Function Prior Level of Function : Independent/Modified Independent             Mobility Comments: Uses single forearm crutch most of the time, but occasionally uses both if pain is higher ADLs Comments: some light assistance such as donning and doffing shoes and socks     Hand Dominance        Extremity/Trunk Assessment   Upper Extremity Assessment Upper Extremity Assessment: Overall WFL for tasks assessed    Lower Extremity Assessment Lower Extremity Assessment: LLE deficits/detail;RLE deficits/detail RLE Deficits / Details: MMT ank PF/DF 5/5 RLE Sensation: WNL LLE Deficits / Details: MMT ank PF/DF 5/5, no extensor lag noted LLE Sensation: WNL    Cervical / Trunk Assessment Cervical / Trunk Assessment: Kyphotic  Communication   Communication: No difficulties  Cognition Arousal/Alertness: Lethargic (General anesthesia) Behavior During Therapy: WFL for tasks assessed/performed Overall Cognitive Status: Within Functional Limits for tasks assessed                                          General Comments General comments (skin integrity, edema, etc.): Wife Nellie present for session    Exercises Total Joint Exercises Ankle Circles/Pumps: AROM, Both, 10 reps, Supine Other Exercises Other Exercises: incentive spirometry x3, VCs for slow and controlled, pt reached 1054m   Assessment/Plan    PT Assessment Patient needs continued PT services  PT Problem List Decreased strength;Decreased range of motion;Decreased activity tolerance;Decreased balance;Decreased mobility;Decreased coordination;Pain       PT Treatment Interventions Gait training;DME instruction;Functional mobility  training;Therapeutic activities;Therapeutic exercise;Balance training;Neuromuscular re-education;Patient/family education    PT Goals (Current goals can be found in the Care Plan section)  Acute Rehab PT Goals Patient Stated Goal: to walk without pain PT Goal Formulation: With patient Time For Goal Achievement: 08/07/21 Potential to Achieve Goals: Good    Frequency 7X/week     Co-evaluation               AM-PAC PT "6 Clicks" Mobility  Outcome Measure Help needed turning from your back to your side while in a flat bed without using bedrails?: None Help needed moving from lying on your back to sitting on the side of a flat bed without using bedrails?: A Little Help needed moving to and from a bed to a chair (including a wheelchair)?: A Little Help needed standing up from a chair using your arms (e.g., wheelchair or bedside chair)?: A Little Help needed to walk in hospital room?: A Little Help needed climbing 3-5 steps with a railing? : A Lot 6 Click Score: 18    End of Session Equipment Utilized During Treatment: Gait belt Activity Tolerance: No increased pain;Patient tolerated treatment well Patient left: in bed;with call Barrell/phone within reach;with bed alarm set;with SCD's reapplied;with family/visitor present Nurse Communication: Mobility status PT Visit Diagnosis: Difficulty in walking, not elsewhere classified (R26.2);Pain Pain - Right/Left: Left Pain - part of body: Knee    Time: 12876-8115PT Time Calculation (  min) (ACUTE ONLY): 23 min   Charges:   PT Evaluation $PT Eval Low Complexity: 1 Low          Coolidge Breeze, PT, DPT [facility] Department Office: [phone] Pager: [phone]  Coolidge Breeze 07/31/2021, 4:48 PM

## 2021-07-31 NOTE — Discharge Instructions (Signed)
 Dr. Brian Swinteck Total Joint Specialist [facility] [address] [place], [place] [address] [phone]  TOTAL KNEE REPLACEMENT POSTOPERATIVE DIRECTIONS    Knee Rehabilitation, Guidelines Following Surgery  Results after knee surgery are often greatly improved when you follow the exercise, range of motion and muscle strengthening exercises prescribed by your doctor. Safety measures are also important to protect the knee from further injury. Any time any of these exercises cause you to have increased pain or swelling in your knee joint, decrease the amount until you are comfortable again and slowly increase them. If you have problems or questions, call your caregiver or physical therapist for advice.   WEIGHT BEARING Weight bearing as tolerated with assist device (walker, cane, etc) as directed, use it as long as suggested by your surgeon or therapist, typically at least 4-6 weeks.  HOME CARE INSTRUCTIONS  Remove items at home which could result in a fall. This includes throw rugs or furniture in walking pathways.  Continue medications as instructed at time of discharge. You may have some home medications which will be placed on hold until you complete the course of blood thinner medication.  You may start showering once you are discharged home but do not submerge the incision under water. Just pat the incision dry and apply a dry gauze dressing on daily. Walk with walker as instructed.  You may resume a sexual relationship in one month or when given the OK by your doctor.  Use walker as long as suggested by your caregivers. Avoid periods of inactivity such as sitting longer than an hour when not asleep. This helps prevent blood clots.  You may put full weight on your legs and walk as much as is comfortable.  You may return to work once you are cleared by your doctor.  Do not drive a car for 6 weeks or until released by you surgeon.  Do not drive while  taking narcotics.  Wear the elastic stockings for three weeks following surgery during the day but you may remove then at night. Make sure you keep all of your appointments after your operation with all of your doctors and caregivers. You should call the office at the above phone number and make an appointment for approximately two weeks after the date of your surgery. Do not remove your surgical dressing. The dressing is waterproof; you may take showers in 3 days, but do not take tub baths or submerge the dressing. Please pick up a stool softener and laxative for home use as long as you are requiring pain medications. ICE to the affected knee every three hours for 30 minutes at a time and then as needed for pain and swelling.  Continue to use ice on the knee for pain and swelling from surgery. You may notice swelling that will progress down to the foot and ankle.  This is normal after surgery.  Elevate the leg when you are not up walking on it.   It is important for you to complete the blood thinner medication as prescribed by your doctor. Continue to use the breathing machine which will help keep your temperature down.  It is common for your temperature to cycle up and down following surgery, especially at night when you are not up moving around and exerting yourself.  The breathing machine keeps your lungs expanded and your temperature down.  RANGE OF MOTION AND STRENGTHENING EXERCISES  Rehabilitation of the knee is important following a knee injury or an   operation. After just a few days of immobilization, the muscles of the thigh which control the knee become weakened and shrink (atrophy). Knee exercises are designed to build up the tone and strength of the thigh muscles and to improve knee motion. Often times heat used for twenty to thirty minutes before working out will loosen up your tissues and help with improving the range of motion but do not use heat for the first two weeks following surgery.  These exercises can be done on a training (exercise) mat, on the floor, on a table or on a bed. Use what ever works the best and is most comfortable for you Knee exercises include:  Leg Lifts - While your knee is still immobilized in a splint or cast, you can do straight leg raises. Lift the leg to 60 degrees, hold for 3 sec, and slowly lower the leg. Repeat 10-20 times 2-3 times daily. Perform this exercise against resistance later as your knee gets better.  Quad and Hamstring Sets - Tighten up the muscle on the front of the thigh (Quad) and hold for 5-10 sec. Repeat this 10-20 times hourly. Hamstring sets are done by pushing the foot backward against an object and holding for 5-10 sec. Repeat as with quad sets.  A rehabilitation program following serious knee injuries can speed recovery and prevent re-injury in the future due to weakened muscles. Contact your doctor or a physical therapist for more information on knee rehabilitation.   POST-OPERATIVE OPIOID TAPER INSTRUCTIONS: It is important to wean off of your opioid medication as soon as possible. If you do not need pain medication after your surgery it is ok to stop day one. Opioids include: Codeine, Hydrocodone(Norco, Vicodin), Oxycodone(Percocet, oxycontin) and hydromorphone amongst others.  Long term and even short term use of opiods can cause: Increased pain response Dependence Constipation Depression Respiratory depression And more.  Withdrawal symptoms can include Flu like symptoms Nausea, vomiting And more Techniques to manage these symptoms Hydrate well Eat regular healthy meals Stay active Use relaxation techniques(deep breathing, meditating, yoga) Do Not substitute Alcohol to help with tapering If you have been on opioids for less than two weeks and do not have pain than it is ok to stop all together.  Plan to wean off of opioids This plan should start within one week post op of your joint replacement. Maintain the same  interval or time between taking each dose and first decrease the dose.  Cut the total daily intake of opioids by one tablet each day Next start to increase the time between doses. The last dose that should be eliminated is the evening dose.    SKILLED REHAB INSTRUCTIONS: If the patient is transferred to a skilled rehab facility following release from the hospital, a list of the current medications will be sent to the facility for the patient to continue.  When discharged from the skilled rehab facility, please have the facility set up the patient's Home Health Physical Therapy prior to being released. Also, the skilled facility will be responsible for providing the patient with their medications at time of release from the facility to include their pain medication, the muscle relaxants, and their blood thinner medication. If the patient is still at the rehab facility at time of the two week follow up appointment, the skilled rehab facility will also need to assist the patient in arranging follow up appointment in our office and any transportation needs.  MAKE SURE YOU:  Understand these instructions.  Will watch   your condition.  Will get help right away if you are not doing well or get worse.    Pick up stool softner and laxative for home use following surgery while on pain medications. Do NOT remove your dressing. You may shower.  Do not take tub baths or submerge incision under water. May shower starting three days after surgery. Please use a clean towel to pat the incision dry following showers. Continue to use ice for pain and swelling after surgery. Do not use any lotions or creams on the incision until instructed by your surgeon.  

## 2021-07-31 NOTE — Anesthesia Procedure Notes (Signed)
Arterial Line Insertion Start/End6/15/2023 8:13 AM, 07/31/2021 8:19 AM Performed by: Oleta Mouse, MD, anesthesiologist  Patient location: OR. Preanesthetic checklist: patient identified, IV checked, site marked, risks and benefits discussed, surgical consent, monitors and equipment checked, pre-op evaluation, timeout performed and anesthesia consent Left, radial was placed Catheter size: 20 G Hand hygiene performed  and maximum sterile barriers used   Attempts: 1 Procedure performed without using ultrasound guided technique. Following insertion, dressing applied and Biopatch. Post procedure assessment: normal and unchanged  Patient tolerated the procedure well with no immediate complications.

## 2021-07-31 NOTE — Interval H&P Note (Signed)
History and Physical Interval Note:  07/31/2021 7:37 AM  Johnny Ochoa.  has presented today for surgery, with the diagnosis of Left knee osteoarthritis.  The various methods of treatment have been discussed with the patient and family. After consideration of risks, benefits and other options for treatment, the patient has consented to  Procedure(s) with comments: Lamar (Left) - 150 as a surgical intervention.  The patient's history has been reviewed, patient examined, no change in status, stable for surgery.  I have reviewed the patient's chart and labs.  Questions were answered to the patient's satisfaction.     Hilton Cork Eduar Kumpf

## 2021-08-01 ENCOUNTER — Encounter (HOSPITAL_COMMUNITY): Payer: Self-pay | Admitting: Orthopedic Surgery

## 2021-08-01 DIAGNOSIS — M1712 Unilateral primary osteoarthritis, left knee: Secondary | ICD-10-CM | POA: Diagnosis not present

## 2021-08-01 LAB — CBC
HCT: 32.4 % — ABNORMAL LOW (ref 39.0–52.0)
Hemoglobin: 10.5 g/dL — ABNORMAL LOW (ref 13.0–17.0)
MCH: 32.9 pg (ref 26.0–34.0)
MCHC: 32.4 g/dL (ref 30.0–36.0)
MCV: 101.6 fL — ABNORMAL HIGH (ref 80.0–100.0)
Platelets: 140 10*3/uL — ABNORMAL LOW (ref 150–400)
RBC: 3.19 MIL/uL — ABNORMAL LOW (ref 4.22–5.81)
RDW: 15.2 % (ref 11.5–15.5)
WBC: 5.5 10*3/uL (ref 4.0–10.5)
nRBC: 0 % (ref 0.0–0.2)

## 2021-08-01 LAB — BASIC METABOLIC PANEL
Anion gap: 5 (ref 5–15)
BUN: 18 mg/dL (ref 8–23)
CO2: 27 mmol/L (ref 22–32)
Calcium: 7.9 mg/dL — ABNORMAL LOW (ref 8.9–10.3)
Chloride: 107 mmol/L (ref 98–111)
Creatinine, Ser: 1.11 mg/dL (ref 0.61–1.24)
GFR, Estimated: >60 mL/min (ref >60)
Glucose, Bld: 98 mg/dL (ref 70–99)
Potassium: 4.3 mmol/L (ref 3.5–5.1)
Sodium: 139 mmol/L (ref 135–145)

## 2021-08-01 LAB — GLUCOSE, CAPILLARY: Glucose-Capillary: 73 mg/dL (ref 70–99)

## 2021-08-01 MED ORDER — VITAMIN D 25 MCG (1000 UNIT) PO TABS
2000.0000 [IU] | ORAL_TABLET | Freq: Every day | ORAL | Status: DC
  Administered 2021-08-01: 2000 [IU] via ORAL
  Filled 2021-08-01: qty 2

## 2021-08-01 MED ORDER — OXYCODONE HCL 5 MG PO TABS: 5.0000 mg | ORAL_TABLET | ORAL | 0 refills | Status: AC | PRN

## 2021-08-01 MED ORDER — DOCUSATE SODIUM 100 MG PO CAPS
100.0000 mg | ORAL_CAPSULE | Freq: Two times a day (BID) | ORAL | 0 refills | Status: AC
Start: 2021-08-01 — End: 2021-08-31

## 2021-08-01 MED ORDER — ACETAMINOPHEN 500 MG PO TABS: 1000.0000 mg | ORAL_TABLET | Freq: Four times a day (QID) | ORAL | 2 refills | Status: AC | PRN

## 2021-08-01 MED ORDER — METHOCARBAMOL 500 MG PO TABS: 500.0000 mg | ORAL_TABLET | Freq: Four times a day (QID) | ORAL | 0 refills | Status: AC | PRN

## 2021-08-01 MED ORDER — ONDANSETRON HCL 4 MG PO TABS: 4.0000 mg | ORAL_TABLET | Freq: Three times a day (TID) | ORAL | 0 refills | Status: DC | PRN

## 2021-08-01 MED ORDER — SENNA 8.6 MG PO TABS
2.0000 | ORAL_TABLET | Freq: Every day | ORAL | 0 refills | Status: AC
Start: 2021-08-01 — End: 2021-08-16

## 2021-08-01 MED ORDER — POLYETHYLENE GLYCOL 3350 17 G PO PACK: 17.0000 g | PACK | Freq: Every day | ORAL | 0 refills | Status: AC | PRN

## 2021-08-01 MED ORDER — ASPIRIN 81 MG PO CHEW: 81.0000 mg | CHEWABLE_TABLET | Freq: Two times a day (BID) | ORAL | 0 refills | Status: AC

## 2021-08-01 NOTE — Progress Notes (Signed)
Pt is ready for discharge.  PIV removed.  Remains alert and oriented.  AVS given.  Pt able to verbalize understanding of all discharge instructions, including wound care.  All questions answered.

## 2021-08-01 NOTE — Anesthesia Postprocedure Evaluation (Signed)
Anesthesia Post Note  Patient: Johnny Ochoa.  Procedure(s) Performed: COMPUTER ASSISTED TOTAL KNEE ARTHROPLASTY (Left: Knee)     Patient location during evaluation: PACU Anesthesia Type: General Level of consciousness: awake and alert Pain management: pain level controlled Vital Signs Assessment: post-procedure vital signs reviewed and stable Respiratory status: spontaneous breathing, nonlabored ventilation, respiratory function stable and patient connected to nasal cannula oxygen Cardiovascular status: blood pressure returned to baseline and stable Postop Assessment: no apparent nausea or vomiting Anesthetic complications: no   No notable events documented.  Last Vitals:  Vitals:   08/01/21 0155 08/01/21 0551  BP: 124/66 (!) 116/56  Pulse: 74 70  Resp: 17 17  Temp: 36.7 C 36.8 C  SpO2: 100% 92%    Last Pain:  Vitals:   08/01/21 0730  TempSrc:   PainSc: 6                  Jones Viviani

## 2021-08-01 NOTE — Plan of Care (Signed)
Problem: Education: Goal: Ability to describe self-care measures that may prevent or decrease complications (Diabetes Survival Skills Education) will improve 08/01/2021 1047 by Emma Schupp L, RN Outcome: Adequate for Discharge 08/01/2021 0743 by Bianney Rockwood L, RN Outcome: Progressing   Problem: Coping: Goal: Ability to adjust to condition or change in health will improve 08/01/2021 1047 by Yvetta Drotar L, RN Outcome: Adequate for Discharge 08/01/2021 0743 by Halvor Behrend L, RN Outcome: Progressing   Problem: Fluid Volume: Goal: Ability to maintain a balanced intake and output will improve 08/01/2021 1047 by Ree Alcalde L, RN Outcome: Adequate for Discharge 08/01/2021 0743 by Destynie Toomey L, RN Outcome: Progressing   Problem: Health Behavior/Discharge Planning: Goal: Ability to identify and utilize available resources and services will improve 08/01/2021 1047 by Sydni Elizarraraz L, RN Outcome: Adequate for Discharge 08/01/2021 0743 by Javanna Patin L, RN Outcome: Progressing Goal: Ability to manage health-related needs will improve 08/01/2021 1047 by Nihal Marzella L, RN Outcome: Adequate for Discharge 08/01/2021 0743 by Rasheen Schewe L, RN Outcome: Progressing   Problem: Metabolic: Goal: Ability to maintain appropriate glucose levels will improve 08/01/2021 1047 by Carol Theys L, RN Outcome: Adequate for Discharge 08/01/2021 0743 by Taleah Bellantoni L, RN Outcome: Progressing   Problem: Nutritional: Goal: Maintenance of adequate nutrition will improve 08/01/2021 1047 by Maevis Mumby L, RN Outcome: Adequate for Discharge 08/01/2021 0743 by Jairy Angulo L, RN Outcome: Progressing Goal: Progress toward achieving an optimal weight will improve 08/01/2021 1047 by Leylanie Woodmansee L, RN Outcome: Adequate for Discharge 08/01/2021 0743 by Sriansh Farra L, RN Outcome: Progressing   Problem: Skin Integrity: Goal: Risk for impaired skin integrity will decrease 08/01/2021 1047 by  Cecily Lawhorne L, RN Outcome: Adequate for Discharge 08/01/2021 0743 by Claude Swendsen L, RN Outcome: Progressing   Problem: Tissue Perfusion: Goal: Adequacy of tissue perfusion will improve 08/01/2021 1047 by Quasim Doyon L, RN Outcome: Adequate for Discharge 08/01/2021 0743 by Aedin Jeansonne L, RN Outcome: Progressing   Problem: Health Behavior/Discharge Planning: Goal: Ability to manage health-related needs will improve 08/01/2021 1047 by Avyon Herendeen L, RN Outcome: Adequate for Discharge 08/01/2021 0743 by Orvell Careaga L, RN Outcome: Progressing   Problem: Clinical Measurements: Goal: Ability to maintain clinical measurements within normal limits will improve 08/01/2021 1047 by Latash Nouri L, RN Outcome: Adequate for Discharge 08/01/2021 0743 by Tomeshia Pizzi L, RN Outcome: Progressing Goal: Will remain free from infection 08/01/2021 1047 by Jameelah Watts L, RN Outcome: Adequate for Discharge 08/01/2021 0743 by Octavia Velador L, RN Outcome: Progressing Goal: Diagnostic test results will improve 08/01/2021 1047 by Maeva Dant L, RN Outcome: Adequate for Discharge 08/01/2021 0743 by Laine Fonner L, RN Outcome: Progressing Goal: Respiratory complications will improve 08/01/2021 1047 by Markevion Lattin L, RN Outcome: Adequate for Discharge 08/01/2021 0743 by Shanaye Rief L, RN Outcome: Progressing Goal: Cardiovascular complication will be avoided 08/01/2021 1047 by Atlas Crossland L, RN Outcome: Adequate for Discharge 08/01/2021 0743 by Priyal Musquiz L, RN Outcome: Progressing   Problem: Activity: Goal: Risk for activity intolerance will decrease 08/01/2021 1047 by Audreyanna Butkiewicz L, RN Outcome: Adequate for Discharge 08/01/2021 0743 by Jvion Turgeon L, RN Outcome: Progressing   Problem: Nutrition: Goal: Adequate nutrition will be maintained 08/01/2021 1047 by Aras Albarran L, RN Outcome: Adequate for Discharge 08/01/2021 0743 by Ariela Mochizuki L, RN Outcome: Progressing    Problem: Coping: Goal: Level of anxiety will decrease 08/01/2021 1047 by Ranisha Allaire L, RN Outcome: Adequate for Discharge 08/01/2021 0743 by Daruis Swaim L, RN  Outcome: Progressing   Problem: Elimination: Goal: Will not experience complications related to bowel motility 08/01/2021 1047 by Ivalee Strauser L, RN Outcome: Adequate for Discharge 08/01/2021 0743 by Reegan Bouffard L, RN Outcome: Progressing Goal: Will not experience complications related to urinary retention 08/01/2021 1047 by Breon Rehm L, RN Outcome: Adequate for Discharge 08/01/2021 0743 by Aneya Daddona L, RN Outcome: Progressing   Problem: Pain Managment: Goal: General experience of comfort will improve 08/01/2021 1047 by Makenzi Bannister L, RN Outcome: Adequate for Discharge 08/01/2021 0743 by Harla Mensch L, RN Outcome: Progressing   Problem: Safety: Goal: Ability to remain free from injury will improve 08/01/2021 1047 by Elicia Lui L, RN Outcome: Adequate for Discharge 08/01/2021 0743 by Deadra Diggins L, RN Outcome: Progressing   Problem: Skin Integrity: Goal: Risk for impaired skin integrity will decrease 08/01/2021 1047 by Enslee Bibbins L, RN Outcome: Adequate for Discharge 08/01/2021 0743 by Shaneika Rossa L, RN Outcome: Progressing   Problem: Education: Goal: Knowledge of the prescribed therapeutic regimen will improve 08/01/2021 1047 by Shanan Mcmiller L, RN Outcome: Adequate for Discharge 08/01/2021 0743 by Sabri Teal L, RN Outcome: Progressing   Problem: Activity: Goal: Ability to avoid complications of mobility impairment will improve 08/01/2021 1047 by Lamarion Mcevers L, RN Outcome: Adequate for Discharge 08/01/2021 0743 by Keyatta Tolles L, RN Outcome: Progressing Goal: Range of joint motion will improve 08/01/2021 1047 by Magen Suriano L, RN Outcome: Adequate for Discharge 08/01/2021 0743 by Haniah Penny L, RN Outcome: Progressing   Problem: Clinical Measurements: Goal: Postoperative  complications will be avoided or minimized 08/01/2021 1047 by Dayelin Balducci L, RN Outcome: Adequate for Discharge 08/01/2021 0743 by Quentyn Kolbeck L, RN Outcome: Progressing   Problem: Pain Management: Goal: Pain level will decrease with appropriate interventions 08/01/2021 1047 by Breana Litts L, RN Outcome: Adequate for Discharge 08/01/2021 0743 by Righteous Claiborne L, RN Outcome: Progressing   Problem: Skin Integrity: Goal: Will show signs of wound healing 08/01/2021 1047 by Darline Faith L, RN Outcome: Adequate for Discharge 08/01/2021 0743 by Jenalee Trevizo L, RN Outcome: Progressing

## 2021-08-01 NOTE — TOC Transition Note (Signed)
Transition of Care (TOC) - CM/SW Discharge Note  Patient Details  Name: Johnny C Krizek Jr. MRN: [id number] Date of Birth: 01/25/1946  Transition of Care (TOC) CM/SW Contact:   S , LCSW Phone Number: 08/01/2021, 9:38 AM  Clinical Narrative: Patient is expected to discharge home after working with PT. CSW met with patient to confirm discharge plan and needs. Patient will go home with OPPT. Patient reported he will have his wife or daughter pick up [facility] rolling walk[facility], so there are no DME needs at this time. TOC signing off.  Final next level of care: OP Rehab Barriers to Discharge: No Barriers Identified  Patient Goals and CMS Choice Patient states their goals for this hospitalization and ongoing recovery are:: Discharge home with OPPT Choice offered to / list presented to : NA  Discharge Plan and Services        DME Arranged: N/A DME Agency: NA  Readmission Risk Interventions     No data to display         

## 2021-08-01 NOTE — Progress Notes (Signed)
Physical Therapy Treatment Patient Details Name: Johnny Ochoa. MRN: [id number] DOB: 1945/06/23 Today's Date: 08/01/2021   History of Present Illness Pt is a 76yo male presenting s/p L-TKA on 07/31/21. PMH: AICD & pacemaker, OA, hx of prostate cancer, CHF, CAD, DM, GERD, gout, HTN, hx of MI, peripheral neuropathy, PTSD, R-TKA 2004.    PT Comments    Pt seen POD1.  Pt supine in bed, reporting radiating pain from his low back to BLE, shooting/tingling but agreeable to be seen. Pt's orthostatic vitals taken since BP in supine was soft (see vitals chart below), pt without any symptoms so proceeded with close min guard and monitoring of symptoms (pt also reporting low blood pressure at baseline). Pt min assist for bed mobility, min guard for transfers, and min guard for ambulation in hallway with RW. At end of ambulation, pt nauseated but after sitting, drinking ginger ale, and RN providing medication asked to continue session. Provided HEP and pt completed appropriate exercises with multimodal cuing. All questions answered to pt's satisfaction and all education completed including walking program, safe ice application, use of gait belt for active-assist movements, car transfers, and pacing of activities. Pt has met all mobility goals for safe discharge, PT will sign off, should needs change please reconsult. Thank you for this referral.     08/01/21 1005  Orthostatic Lying   BP- Lying 91/55  Pulse- Lying 74  Orthostatic Sitting  BP- Sitting 109/58  Pulse- Sitting 75  Orthostatic Standing at 0 minutes  BP- Standing at 0 minutes 105/67  Pulse- Standing at 0 minutes 79  Orthostatic Standing at 3 minutes  BP- Standing at 3 minutes 120/48      Recommendations for follow up therapy are one component of a multi-disciplinary discharge planning process, led by the attending physician.  Recommendations may be updated based on patient status, additional functional criteria and insurance  authorization.  Follow Up Recommendations  Follow physician's recommendations for discharge plan and follow up therapies     Assistance Recommended at Discharge Intermittent Supervision/Assistance  Patient can return home with the following A little help with walking and/or transfers;A little help with bathing/dressing/bathroom;Assistance with cooking/housework;Assist for transportation;Help with stairs or ramp for entrance   Equipment Recommendations  Rolling walker (2 wheels)    Recommendations for Other Services       Precautions / Restrictions Precautions Precautions: Fall Restrictions Weight Bearing Restrictions: Yes LLE Weight Bearing: Weight bearing as tolerated     Mobility  Bed Mobility Overal bed mobility: Needs Assistance Bed Mobility: Supine to Sit, Sit to Supine     Supine to sit: Min assist     General bed mobility comments: Min assist for trunk elevation, scooting EOB.    Transfers Overall transfer level: Needs assistance Equipment used: Rolling walker (2 wheels) Transfers: Sit to/from Stand Sit to Stand: From elevated surface, Min guard           General transfer comment: Pt completed sit to stand transfer from elevated surface with min guard, no physical assist required.    Ambulation/Gait Ambulation/Gait assistance: Min guard Gait Distance (Feet): 50 Feet Assistive device: Rolling walker (2 wheels) Gait Pattern/deviations: Step-to pattern       General Gait Details: Pt ambulated 30ft with RW and mn guard, no physical assist required or overt LOB noted, recliner follow for safety.At end of ambulation, pt nauseated, RN gave zofran   Optometrist  Modified Rankin (Stroke Patients Only)       Balance Overall balance assessment: Needs assistance Sitting-balance support: Feet supported, No upper extremity supported Sitting balance-Leahy Scale: Normal     Standing balance support: Reliant on  assistive device for balance, During functional activity, Bilateral upper extremity supported Standing balance-Leahy Scale: Poor                              Cognition Arousal/Alertness: Awake/alert Behavior During Therapy: WFL for tasks assessed/performed Overall Cognitive Status: Within Functional Limits for tasks assessed                                          Exercises Total Joint Exercises Ankle Circles/Pumps: AROM, Both, 10 reps, Supine Quad Sets: AROM, 10 reps, Left Short Arc Quad: AROM, Left, 10 reps Heel Slides: AROM, AAROM, 10 reps (5 active, 5 active assisted) Hip ABduction/ADduction: AROM, Left, 10 reps Straight Leg Raises: AAROM, Left, 10 reps    General Comments        Pertinent Vitals/Pain Pain Assessment Pain Assessment: 0-10 Pain Score: 8  Pain Location: left knee Pain Descriptors / Indicators: Operative site guarding, Discomfort Pain Intervention(s): Limited activity within patient's tolerance, Monitored during session, Repositioned, Ice applied    Home Living                          Prior Function            PT Goals (current goals can now be found in the care plan section) Acute Rehab PT Goals Patient Stated Goal: to walk without pain PT Goal Formulation: With patient Time For Goal Achievement: 08/07/21 Potential to Achieve Goals: Good Progress towards PT goals: Progressing toward goals    Frequency    7X/week      PT Plan Current plan remains appropriate    Co-evaluation              AM-PAC PT "6 Clicks" Mobility   Outcome Measure  Help needed turning from your back to your side while in a flat bed without using bedrails?: None Help needed moving from lying on your back to sitting on the side of a flat bed without using bedrails?: A Little Help needed moving to and from a bed to a chair (including a wheelchair)?: A Little Help needed standing up from a chair using your arms (e.g.,  wheelchair or bedside chair)?: A Little Help needed to walk in hospital room?: A Little Help needed climbing 3-5 steps with a railing? : A Lot 6 Click Score: 18    End of Session Equipment Utilized During Treatment: Gait belt Activity Tolerance: No increased pain;Patient tolerated treatment well Patient left: with call Delsignore/phone within reach;with family/visitor present;in chair;with chair alarm set Nurse Communication: Mobility status PT Visit Diagnosis: Difficulty in walking, not elsewhere classified (R26.2);Pain Pain - Right/Left: Left Pain - part of body: Knee     Time: 1000-1048 PT Time Calculation (min) (ACUTE ONLY): 48 min  Charges:  $Gait Training: 8-22 mins $Therapeutic Exercise: 8-22 mins $Self Care/Home Management: 8-22                    Coolidge Breeze, PT, DPT [facility] Office: [phone] Pager: [phone]  Coolidge Breeze 08/01/2021, 10:52 AM

## 2021-08-01 NOTE — Plan of Care (Signed)
  Problem: Education: Goal: Ability to describe self-care measures that may prevent or decrease complications (Diabetes Survival Skills Education) will improve Outcome: Progressing   Problem: Coping: Goal: Ability to adjust to condition or change in health will improve Outcome: Progressing   Problem: Fluid Volume: Goal: Ability to maintain a balanced intake and output will improve Outcome: Progressing   Problem: Health Behavior/Discharge Planning: Goal: Ability to identify and utilize available resources and services will improve Outcome: Progressing Goal: Ability to manage health-related needs will improve Outcome: Progressing   Problem: Metabolic: Goal: Ability to maintain appropriate glucose levels will improve Outcome: Progressing   Problem: Nutritional: Goal: Maintenance of adequate nutrition will improve Outcome: Progressing Goal: Progress toward achieving an optimal weight will improve Outcome: Progressing   Problem: Skin Integrity: Goal: Risk for impaired skin integrity will decrease Outcome: Progressing   Problem: Tissue Perfusion: Goal: Adequacy of tissue perfusion will improve Outcome: Progressing   Problem: Health Behavior/Discharge Planning: Goal: Ability to manage health-related needs will improve Outcome: Progressing   Problem: Clinical Measurements: Goal: Ability to maintain clinical measurements within normal limits will improve Outcome: Progressing Goal: Will remain free from infection Outcome: Progressing Goal: Diagnostic test results will improve Outcome: Progressing Goal: Respiratory complications will improve Outcome: Progressing Goal: Cardiovascular complication will be avoided Outcome: Progressing   Problem: Activity: Goal: Risk for activity intolerance will decrease Outcome: Progressing   Problem: Nutrition: Goal: Adequate nutrition will be maintained Outcome: Progressing   Problem: Coping: Goal: Level of anxiety will  decrease Outcome: Progressing   Problem: Elimination: Goal: Will not experience complications related to bowel motility Outcome: Progressing Goal: Will not experience complications related to urinary retention Outcome: Progressing   Problem: Pain Managment: Goal: General experience of comfort will improve Outcome: Progressing   Problem: Safety: Goal: Ability to remain free from injury will improve Outcome: Progressing   Problem: Skin Integrity: Goal: Risk for impaired skin integrity will decrease Outcome: Progressing   Problem: Education: Goal: Knowledge of the prescribed therapeutic regimen will improve Outcome: Progressing   Problem: Activity: Goal: Ability to avoid complications of mobility impairment will improve Outcome: Progressing Goal: Range of joint motion will improve Outcome: Progressing   Problem: Clinical Measurements: Goal: Postoperative complications will be avoided or minimized Outcome: Progressing   Problem: Pain Management: Goal: Pain level will decrease with appropriate interventions Outcome: Progressing   Problem: Skin Integrity: Goal: Will show signs of wound healing Outcome: Progressing

## 2021-08-01 NOTE — Progress Notes (Signed)
    Subjective:  Patient reports pain as moderate.  Denies N/V/CP/SOB/Abd pain. Patient states that he is having pain that the medicine isn't helping much. He also states that when he was on pain medication with pain management it didn't help him much either.  He reports pain in bilateral lower extremities with shooting pains up and down his legs especially in the right extremity or throbbing pain. He states the pain is about the same in both legs. He does have a history of chronic low back pain. We discussed some of the pain he is having might be coming from his back. He denies tingling and numbness in LE bilaterally.   Objective:   VITALS:   Vitals:   07/31/21 1518 07/31/21 2045 08/01/21 0155 08/01/21 0551  BP: 107/64 119/69 124/66 (!) 116/56  Pulse: 74 74 74 70  Resp: '17 17 17 17  '$ Temp:  97.7 F (36.5 C) 98 F (36.7 C) 98.3 F (36.8 C)  TempSrc:  Oral Oral Oral  SpO2: 96% 100% 100% 92%  Weight:      Height:        Patient is lying in bed. NAD.  Neurologically intact ABD soft Neurovascular intact Sensation intact distally Intact pulses distally Dorsiflexion/Plantar flexion intact Incision: dressing C/D/I No cellulitis present Compartment soft   Lab Results  Component Value Date   WBC 5.5 08/01/2021   HGB 10.5 (L) 08/01/2021   HCT 32.4 (L) 08/01/2021   MCV 101.6 (H) 08/01/2021   PLT 140 (L) 08/01/2021   BMET    Component Value Date/Time   NA 139 08/01/2021 0317   K 4.3 08/01/2021 0317   CL 107 08/01/2021 0317   CO2 27 08/01/2021 0317   GLUCOSE 98 08/01/2021 0317   BUN 18 08/01/2021 0317   CREATININE 1.11 08/01/2021 0317   CALCIUM 7.9 (L) 08/01/2021 0317   GFRNONAA >60 08/01/2021 0317     Assessment/Plan: 1 Day Post-Op   Principal Problem:   Osteoarthritis of left knee Active Problems:   S/P total knee arthroplasty, left   WBAT with walker DVT ppx: Aspirin and plavix, SCDs, TEDS PO pain control:  PT/OT: PT came to work with patient yesterday  but patient was very lethargic and fatigued from anesthesia. PT to come today.  Dispo: D/c home with OPPT once cleared by PT.    Charlott Rakes, PA-C 08/01/2021, 7:06 AM   [facility]  [place] [address], [address], [place], [place] [address] Phone: [phone] [email or web address] Facebook  [employer]

## 2022-01-01 ENCOUNTER — Encounter (HOSPITAL_BASED_OUTPATIENT_CLINIC_OR_DEPARTMENT_OTHER): Payer: Self-pay | Admitting: Emergency Medicine

## 2022-01-01 ENCOUNTER — Emergency Department (HOSPITAL_BASED_OUTPATIENT_CLINIC_OR_DEPARTMENT_OTHER): Payer: Medicare Other

## 2022-01-01 ENCOUNTER — Other Ambulatory Visit: Payer: Self-pay

## 2022-01-01 ENCOUNTER — Emergency Department (HOSPITAL_BASED_OUTPATIENT_CLINIC_OR_DEPARTMENT_OTHER)
Admission: EM | Admit: 2022-01-01 | Discharge: 2022-01-01 | Disposition: A | Discharge status: Home / Self Care | Payer: Medicare Other | Attending: Emergency Medicine | Admitting: Emergency Medicine

## 2022-01-01 DIAGNOSIS — R8271 Bacteriuria: Secondary | ICD-10-CM

## 2022-01-01 DIAGNOSIS — I509 Heart failure, unspecified: Secondary | ICD-10-CM | POA: Insufficient documentation

## 2022-01-01 DIAGNOSIS — R109 Unspecified abdominal pain: Secondary | ICD-10-CM | POA: Diagnosis not present

## 2022-01-01 DIAGNOSIS — R197 Diarrhea, unspecified: Secondary | ICD-10-CM | POA: Insufficient documentation

## 2022-01-01 DIAGNOSIS — E119 Type 2 diabetes mellitus without complications: Secondary | ICD-10-CM | POA: Diagnosis not present

## 2022-01-01 DIAGNOSIS — Z794 Long term (current) use of insulin: Secondary | ICD-10-CM | POA: Insufficient documentation

## 2022-01-01 DIAGNOSIS — Z95810 Presence of automatic (implantable) cardiac defibrillator: Secondary | ICD-10-CM | POA: Diagnosis not present

## 2022-01-01 DIAGNOSIS — Z7984 Long term (current) use of oral hypoglycemic drugs: Secondary | ICD-10-CM | POA: Insufficient documentation

## 2022-01-01 DIAGNOSIS — R1110 Vomiting, unspecified: Secondary | ICD-10-CM | POA: Diagnosis present

## 2022-01-01 LAB — CBC WITH DIFFERENTIAL/PLATELET
Abs Immature Granulocytes: 0.02 10*3/uL (ref 0.00–0.07)
Basophils Absolute: 0 10*3/uL (ref 0.0–0.1)
Basophils Relative: 0 %
Eosinophils Absolute: 0.2 10*3/uL (ref 0.0–0.5)
Eosinophils Relative: 3 %
HCT: 44.3 % (ref 39.0–52.0)
Hemoglobin: 14.3 g/dL (ref 13.0–17.0)
Immature Granulocytes: 0 %
Lymphocytes Relative: 9 %
Lymphs Abs: 0.6 10*3/uL — ABNORMAL LOW (ref 0.7–4.0)
MCH: 31 pg (ref 26.0–34.0)
MCHC: 32.3 g/dL (ref 30.0–36.0)
MCV: 96.1 fL (ref 80.0–100.0)
Monocytes Absolute: 0.4 10*3/uL (ref 0.1–1.0)
Monocytes Relative: 5 %
Neutro Abs: 5.4 10*3/uL (ref 1.7–7.7)
Neutrophils Relative %: 83 %
Platelets: 227 10*3/uL (ref 150–400)
RBC: 4.61 MIL/uL (ref 4.22–5.81)
RDW: 17.8 % — ABNORMAL HIGH (ref 11.5–15.5)
WBC: 6.6 10*3/uL (ref 4.0–10.5)
nRBC: 0 % (ref 0.0–0.2)

## 2022-01-01 LAB — COMPREHENSIVE METABOLIC PANEL
ALT: 19 U/L (ref 0–44)
AST: 29 U/L (ref 15–41)
Albumin: 4 g/dL (ref 3.5–5.0)
Alkaline Phosphatase: 78 U/L (ref 38–126)
Anion gap: 7 (ref 5–15)
BUN: 19 mg/dL (ref 8–23)
CO2: 28 mmol/L (ref 22–32)
Calcium: 9.2 mg/dL (ref 8.9–10.3)
Chloride: 103 mmol/L (ref 98–111)
Creatinine, Ser: 1.22 mg/dL (ref 0.61–1.24)
GFR, Estimated: >60 mL/min (ref >60)
Glucose, Bld: 125 mg/dL — ABNORMAL HIGH (ref 70–99)
Potassium: 4.8 mmol/L (ref 3.5–5.1)
Sodium: 138 mmol/L (ref 135–145)
Total Bilirubin: 0.4 mg/dL (ref 0.3–1.2)
Total Protein: 8.6 g/dL — ABNORMAL HIGH (ref 6.5–8.1)

## 2022-01-01 LAB — URINALYSIS, ROUTINE W REFLEX MICROSCOPIC
Bilirubin Urine: NEGATIVE
Glucose, UA: >=500 mg/dL — AB
Hgb urine dipstick: NEGATIVE
Ketones, ur: NEGATIVE mg/dL
Nitrite: NEGATIVE
Protein, ur: NEGATIVE mg/dL
Specific Gravity, Urine: 1.02 (ref 1.005–1.030)
pH: 5.5 (ref 5.0–8.0)

## 2022-01-01 LAB — LIPASE, BLOOD: Lipase: 41 U/L (ref 11–51)

## 2022-01-01 LAB — URINALYSIS, MICROSCOPIC (REFLEX)

## 2022-01-01 MED ORDER — IOHEXOL 300 MG/ML  SOLN
100.0000 mL | Freq: Once | INTRAMUSCULAR | Status: AC | PRN
  Administered 2022-01-01: 100 mL via INTRAVENOUS

## 2022-01-01 MED ORDER — ONDANSETRON HCL 4 MG/2ML IJ SOLN
4.0000 mg | Freq: Once | INTRAMUSCULAR | Status: AC
  Administered 2022-01-01: 4 mg via INTRAVENOUS
  Filled 2022-01-01: qty 2

## 2022-01-01 MED ORDER — ZINC OXIDE 11.3 % EX CREA
TOPICAL_CREAM | CUTANEOUS | Status: DC | PRN
  Administered 2022-01-01: 1 via TOPICAL

## 2022-01-01 MED ORDER — SODIUM CHLORIDE 0.9 % IV BOLUS
1000.0000 mL | Freq: Once | INTRAVENOUS | Status: AC
  Administered 2022-01-01: 1000 mL via INTRAVENOUS

## 2022-01-01 MED ORDER — ONDANSETRON 4 MG PO TBDP: ORAL_TABLET | ORAL | 0 refills | Status: DC

## 2022-01-01 NOTE — ED Provider Notes (Signed)
[facility] Provider Note   CSN: [id number] Arrival date & time: 01/01/22  1651     History  Chief Complaint  Patient presents with   Abdominal Pain   Emesis   Diarrhea    Johnny Ochoa. is a 76 y.o. male history of gout, CHF with EF of 35%, AICD, diabetes here presenting with abdominal pain and vomiting and diarrhea.  Patient states that he went to some places over the weekend that provided free food for veterans.  He states that he is feeling fine until this morning.  Had sudden onset of diarrhea and vomiting.  He states that he is in the bathroom constantly with diarrhea that is watery.  He also vomited several times.  Denies any recent antibiotic use and denies any history of C. difficile.  The history is provided by the patient.       Home Medications Prior to Admission medications   Medication Sig Start Date End Date Taking? Authorizing Provider  acetaminophen (TYLENOL) 325 MG tablet Take 650 mg by mouth every 6 (six) hours as needed for moderate pain (pain).    [provider]  acetaminophen (TYLENOL) 500 MG tablet Take 2 tablets (1,000 mg total) by mouth every 6 (six) hours as needed. 08/01/21 08/01/22  Charlott Rakes, PA-C  albuterol (PROVENTIL HFA;VENTOLIN HFA) 108 (90 BASE) MCG/ACT inhaler Inhale 2 puffs into the lungs every 4 (four) hours as needed for wheezing or shortness of breath. 08/24/13   Orpah Greek, MD  allopurinol (ZYLOPRIM) 100 MG tablet Take 100 mg by mouth daily. 06/09/21   [provider]  ascorbic acid (VITAMIN C) 500 MG tablet Take 500 mg by mouth daily. 11/30/19   [provider]  atorvastatin (LIPITOR) 80 MG tablet Take 40 mg by mouth at bedtime.     [provider]  carbidopa-levodopa (SINEMET IR) 25-100 MG tablet Take 1 tablet by mouth in the morning, at noon, and at bedtime. 06/02/21   [provider]  Cholecalciferol 50 MCG (2000 UT) TABS Take 2,000 Units by mouth  daily. 11/30/19   [provider]  clopidogrel (PLAVIX) 75 MG tablet Take 75 mg by mouth daily. 02/16/16   [provider]  colchicine 0.6 MG tablet TAKE 1 TABLET BY MOUTH DAILY 04/27/20   Evelina Bucy, DPM  DULoxetine (CYMBALTA) 60 MG capsule Take 60 mg by mouth daily.     [provider]  empagliflozin (JARDIANCE) 25 MG TABS tablet Take 25 mg by mouth daily. 02/27/21   [provider]  ezetimibe (ZETIA) 10 MG tablet Take 10 mg by mouth daily. 02/27/21   [provider]  Ferrous Sulfate (IRON) 325 (65 Fe) MG TABS Take 325 mg by mouth daily. Patient not taking: Reported on 07/17/2021    [provider]  furosemide (LASIX) 40 MG tablet Take 1 tablet (40 mg total) by mouth daily. Take an extra dose if you gain 2 lbs in 24 hours. 02/28/16   Rama, Venetia Maxon, MD  gabapentin (NEURONTIN) 400 MG capsule Take 400 mg by mouth 3 (three) times daily.     [provider]  glucose 4 GM chewable tablet Chew 1 tablet by mouth as needed for low blood sugar.    [provider]  indomethacin (INDOCIN) 50 MG capsule TAKE 1 CAPSULE(50 MG) BY MOUTH TWICE DAILY WITH A MEAL Patient not taking: Reported on 07/17/2021 08/02/18   Hyatt, Max T, DPM  isosorbide mononitrate (IMDUR) 60 MG  24 hr tablet Take 1 tablet (60 mg total) by mouth daily. 07/22/20 07/31/21  Dessa Phi, DO  Magnesium 400 MG TABS Take 400 mg by mouth daily.    [provider]  methocarbamol (ROBAXIN) 500 MG tablet Take 1 tablet (500 mg total) by mouth every 6 (six) hours as needed for muscle spasms. 08/01/21   Charlott Rakes, PA-C  metoprolol succinate (TOPROL-XL) 100 MG 24 hr tablet Take 100 mg by mouth daily. Take with or immediately following a meal.    [provider]  Multiple Vitamin (MULTIVITAMIN WITH MINERALS) TABS tablet Take 1 tablet by mouth daily.    [provider]  nitroGLYCERIN (NITROSTAT) 0.4 MG SL tablet Place 0.4 mg under the tongue every 5 (five)  minutes as needed for chest pain.     [provider]  omeprazole (PRILOSEC) 20 MG capsule Take 20 mg by mouth daily.    [provider]  ondansetron (ZOFRAN) 4 MG tablet Take 1 tablet (4 mg total) by mouth every 8 (eight) hours as needed for nausea or vomiting. 08/01/21 08/01/22  Charlott Rakes, PA-C  oxybutynin (DITROPAN-XL) 5 MG 24 hr tablet Take 5 mg by mouth daily. 04/21/21   [provider]  potassium chloride (K-DUR,KLOR-CON) 10 MEQ tablet Take 10 mEq by mouth daily.    [provider]  primidone (MYSOLINE) 50 MG tablet Take 150 mg by mouth daily.    [provider]  sacubitril-valsartan (ENTRESTO) 49-51 MG Take 1 tablet 2 (two) times daily by mouth.    [provider]  Semaglutide, 2 MG/DOSE, (OZEMPIC, 2 MG/DOSE,) 8 MG/3ML SOPN Inject 2 mg into the skin once a week 05/27/21     spironolactone (ALDACTONE) 25 MG tablet Take 25 mg by mouth daily. 02/16/16   [provider]  tamsulosin (FLOMAX) 0.4 MG CAPS capsule Take 0.8 mg by mouth at bedtime.     [provider]  terbinafine (LAMISIL) 1 % cream Apply 1 application topically daily as needed for itching.    [provider]  traZODone (DESYREL) 150 MG tablet Take 150 mg by mouth at bedtime.    [provider]      Allergies    Metformin and Metformin hcl    Review of Systems   Review of Systems  Gastrointestinal:  Positive for abdominal pain, diarrhea and vomiting.  All other systems reviewed and are negative.   Physical Exam Updated Vital Signs BP 125/73 (BP Location: Left Arm)   Pulse 95   Temp 97.6 F (36.4 C)   Resp 18   SpO2 100%  Physical Exam Vitals and nursing note reviewed.  Constitutional:      Comments: Dehydrated and uncomfortable  HENT:     Head: Normocephalic.     Mouth/Throat:     Pharynx: Oropharynx is clear.  Eyes:     Extraocular Movements: Extraocular movements intact.     Pupils: Pupils are equal, round, and reactive  to light.  Cardiovascular:     Rate and Rhythm: Normal rate and regular rhythm.  Pulmonary:     Effort: Pulmonary effort is normal.     Breath sounds: Normal breath sounds.  Abdominal:     General: Abdomen is flat.     Comments: Mild diffuse tenderness   Skin:    General: Skin is warm.     Capillary Refill: Capillary refill takes less than 2 seconds.  Neurological:     General: No focal deficit present.  Mental Status: He is oriented to person, place, and time.  Psychiatric:        Mood and Affect: Mood normal.        Behavior: Behavior normal.     ED Results / Procedures / Treatments   Labs (all labs ordered are listed, but only abnormal results are displayed) Labs Reviewed  COMPREHENSIVE METABOLIC PANEL - Abnormal; Notable for the following components:      Result Value   Glucose, Bld 125 (*)    Total Protein 8.6 (*)    All other components within normal limits  URINALYSIS, ROUTINE W REFLEX MICROSCOPIC - Abnormal; Notable for the following components:   APPearance CLOUDY (*)    Glucose, UA >=500 (*)    Leukocytes,Ua SMALL (*)    All other components within normal limits  CBC WITH DIFFERENTIAL/PLATELET - Abnormal; Notable for the following components:   RDW 17.8 (*)    Lymphs Abs 0.6 (*)    All other components within normal limits  URINALYSIS, MICROSCOPIC (REFLEX) - Abnormal; Notable for the following components:   Bacteria, UA MANY (*)    All other components within normal limits  C DIFFICILE QUICK SCREEN W PCR REFLEX    LIPASE, BLOOD    EKG None  Radiology No results found.  Procedures Procedures    Medications Ordered in ED Medications  sodium chloride 0.9 % bolus 1,000 mL (has no administration in time range)  ondansetron (ZOFRAN) injection 4 mg (has no administration in time range)  zinc oxide (BALMEX) 11.3 % cream (has no administration in time range)    ED Course/ Medical Decision Making/ A&P                           Medical Decision  Making Demarkis Gheen. is a 76 y.o. male here presenting with vomiting and diarrhea.  Likely viral gastroenteritis.  We will get CT abdomen pelvis to rule out colitis or obstruction.  Low suspicion for C. difficile and if patient is able to give a stool sample, will send for C. difficile.  We will check CBC CMP and lipase and CT abdomen pelvis and will hydrate and reassess.  8:17 PM CT abdomen pelvis unremarkable.  It showed questionable cystitis.  His urinalysis showed some bacteria but I am not sure if it is contaminated or not and he has no urinary symptoms.  I will send off urine culture and hold antibiotics as I think he likely has viral gastroenteritis.  I think giving antibiotics will risk causing C. difficile.  Will discharge home with Zofran and told him to stay hydrated.  Problems Addressed: Vomiting and diarrhea: acute illness or injury  Amount and/or Complexity of Data Reviewed Labs: ordered. Decision-making details documented in ED Course. Radiology: ordered.  Risk OTC drugs. Prescription drug management.    Final Clinical Impression(s) / ED Diagnoses Final diagnoses:  None    Rx / DC Orders ED Discharge Orders     None         Drenda Freeze, MD 01/01/22 2020

## 2022-01-01 NOTE — ED Notes (Signed)
ED Provider at bedside. 

## 2022-01-01 NOTE — ED Triage Notes (Signed)
Generalized abdominal pain, n/v/d since this morning. Started after breakfast. Denies uri sx.

## 2022-01-01 NOTE — Discharge Instructions (Signed)
Please stay hydrated and take Zofran for nausea  You have an incidental finding of gallbladder stone that can be followed up with your doctor.  You may take Imodium up to 10 times a day.  You have some bacteria in your urine and I sent off a urine culture and if it is positive we will call in antibiotics.  You likely have a viral gastroenteritis  See your doctor for follow-up  Return to ER if you have worse abdominal pain or vomiting or fever

## 2022-01-04 LAB — URINE CULTURE: Culture: >=100000 — AB

## 2022-01-05 ENCOUNTER — Telehealth (HOSPITAL_BASED_OUTPATIENT_CLINIC_OR_DEPARTMENT_OTHER): Payer: Self-pay | Admitting: *Deleted

## 2022-01-05 NOTE — Telephone Encounter (Signed)
Post ED Visit - Positive Culture Follow-up  Culture report reviewed by antimicrobial stewardship pharmacist: [place] Team '[]'$  Nathan Batchelder, [address].D. '[]'$  [place], Pharm.D., BCPS AQ-ID '[]'$  Heide Guile, Pharm.D., BCPS '[]'$  Parks Neptune, Pharm.D., BCPS '[]'$  [place], Pharm.D., BCPS, AAHIVP '[]'$  [place], Pharm.D., BCPS, AAHIVP '[]'$  Legrand Como, PharmD, BCPS '[]'$  Salome Arnt, PharmD, BCPS '[]'$  Johnnette Gourd, PharmD, BCPS '[]'$  Hughes Better, PharmD '[]'$  Leeroy Cha, PharmD, BCPS '[x]'$  Laqueta Linden, PharmD  [place] Team '[]'$  [address], PharmD '[]'$  Leodis Sias, PharmD '[]'$  Lindell Spar, PharmD '[]'$  Royetta Asal, Rph '[]'$  Graylin Shiver) Rema Fendt, PharmD '[]'$  Glennon Mac, PharmD '[]'$  Arlyn Dunning, PharmD '[]'$  Netta Cedars, PharmD '[]'$  Dia Sitter, PharmD '[]'$  Leone Haven, PharmD '[]'$  Gretta Arab, PharmD '[]'$  Theodis Shove, PharmD '[]'$  Peggyann Juba, PharmD   Positive urine culture No further patient follow-up is required at this time.  Reuel Boom 01/05/2022, 10:09 AM

## 2022-02-06 ENCOUNTER — Other Ambulatory Visit (HOSPITAL_COMMUNITY): Payer: Self-pay

## 2022-06-11 IMAGING — DX DG FOOT COMPLETE 3+V*L*
3 series · 3 of 3 positions shown · non-contrast
Comparison: None

CLINICAL DATA: Fall. Hip pain. Left foot pain and swelling for 2
weeks. Fell 2 days ago.

EXAM:
LEFT FOOT - COMPLETE 3+ VIEW

[foot ap]
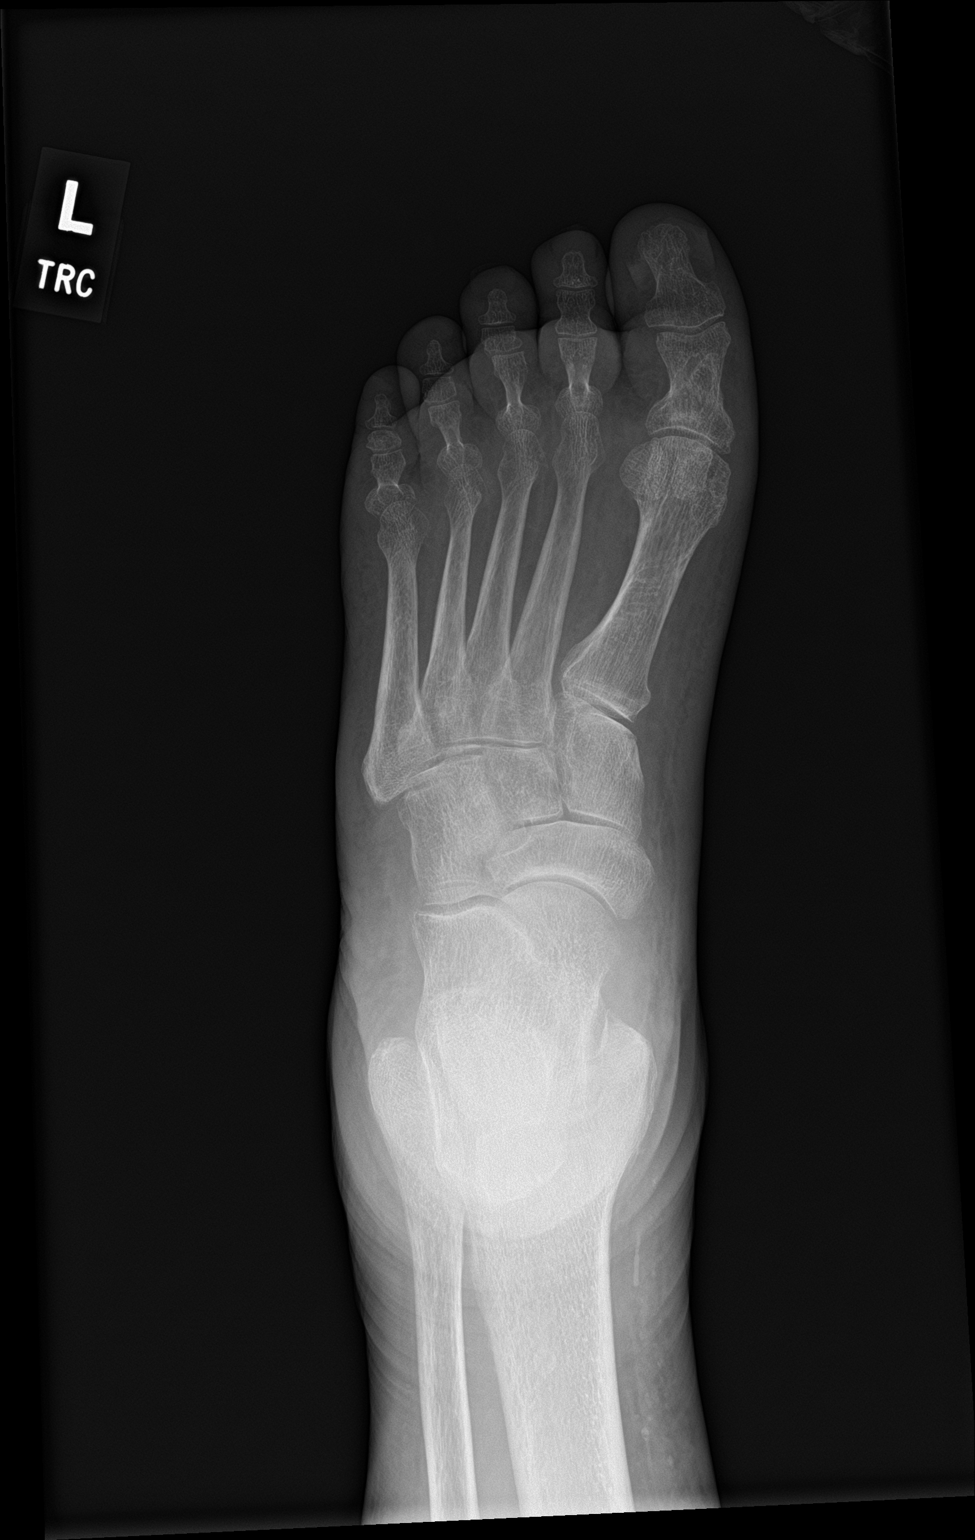

[foot obl]
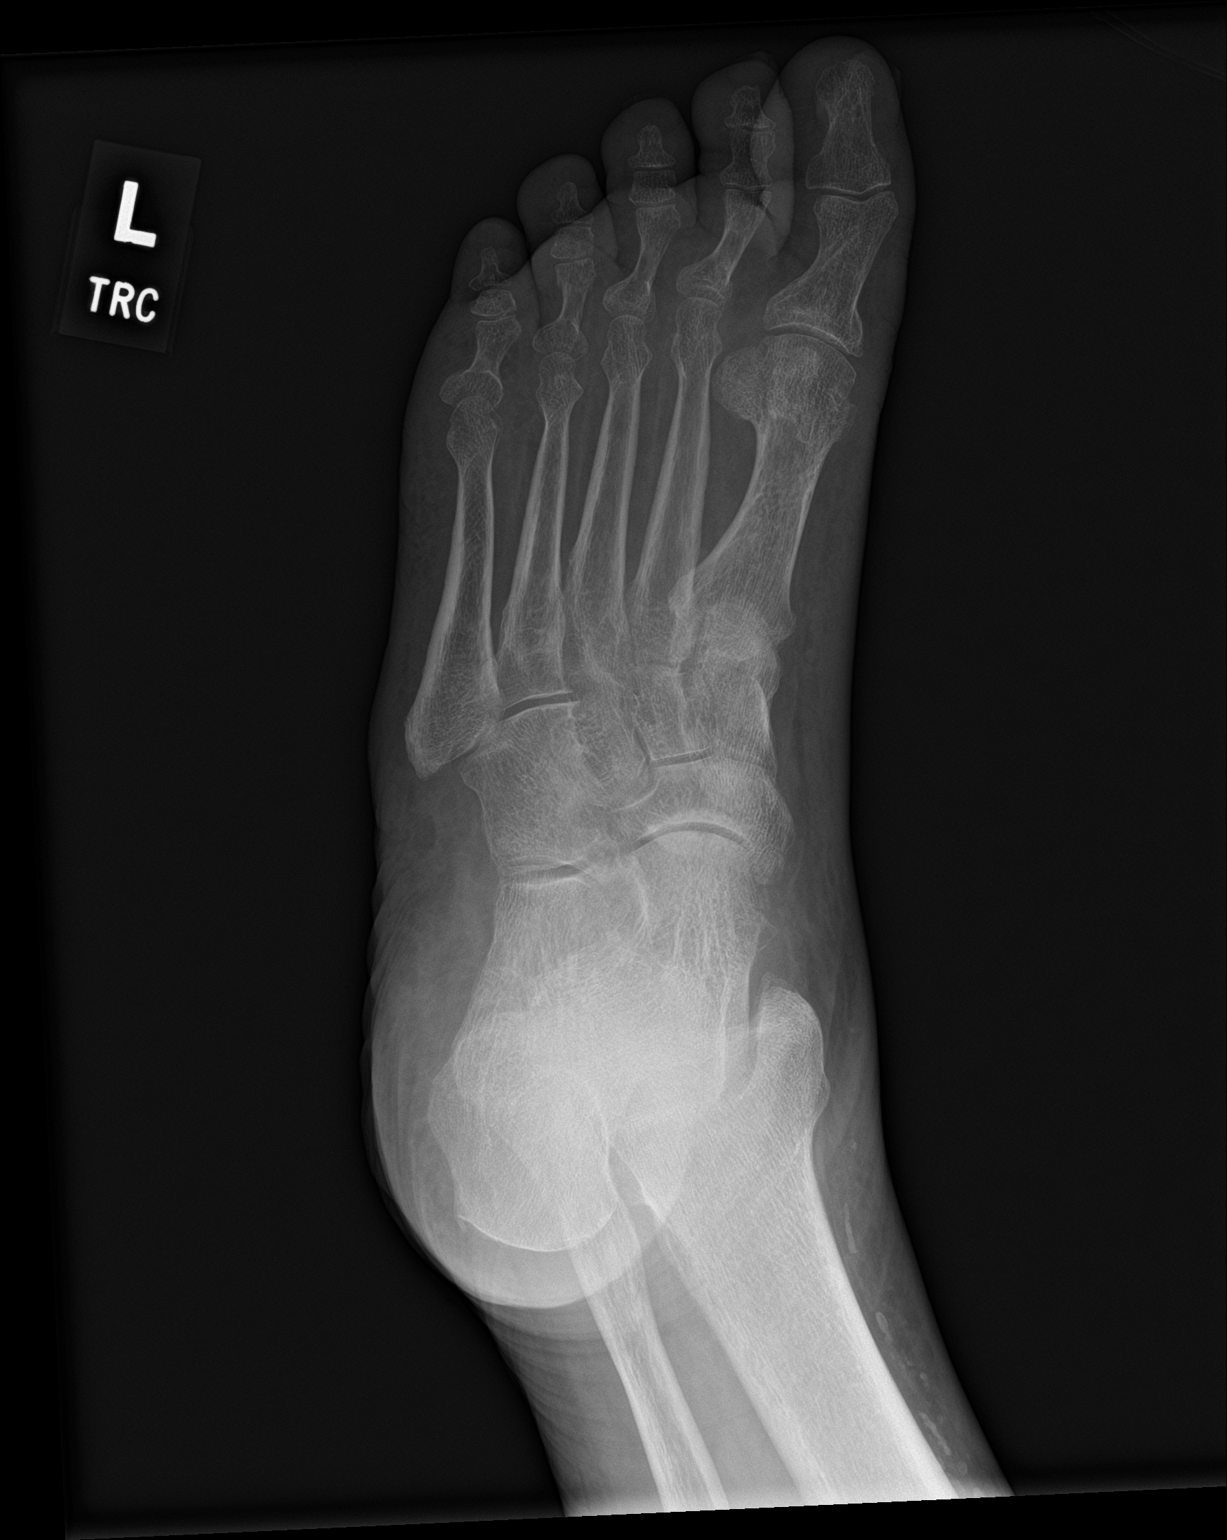

[foot lat]
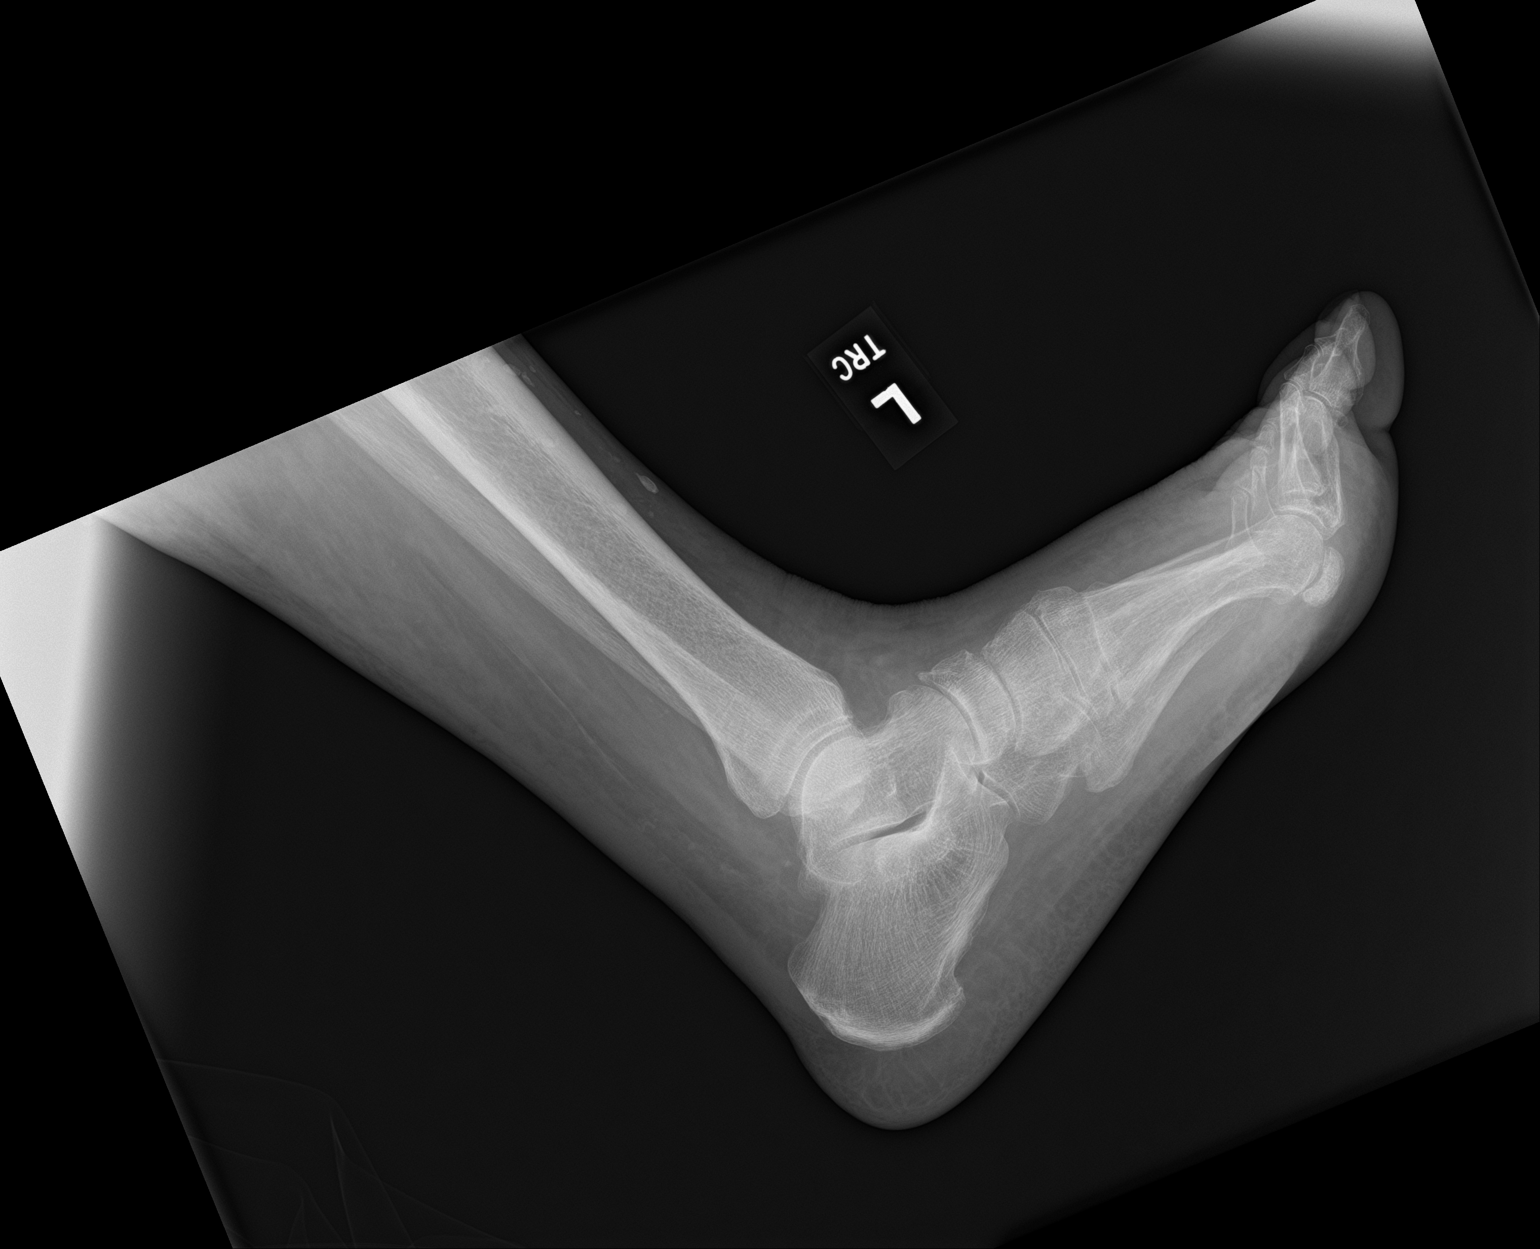

[3 of 3 positions shown; findings below may reference images not displayed]

FINDINGS: There is diffuse decreased bone mineralization. Mild dorsal
talonavicular and tarsometatarsal degenerative osteophytes. Mild
great toe metatarsophalangeal joint space narrowing and peripheral
osteophytosis. No acute fracture or dislocation. Vascular
phleboliths overlie the anterior shin soft tissues.
IMPRESSION: Mild midfoot and great toe metatarsophalangeal joint osteoarthritis.

## 2022-06-11 IMAGING — DX DG HIP (WITH OR WITHOUT PELVIS) 2-3V*R*
3 series · 3 of 3 positions shown · non-contrast
Comparison: None.

CLINICAL DATA: 75-year-old male with right hip pain after fall.

EXAM:
DG HIP (WITH OR WITHOUT PELVIS) 2-3V RIGHT

[pelvis ap]
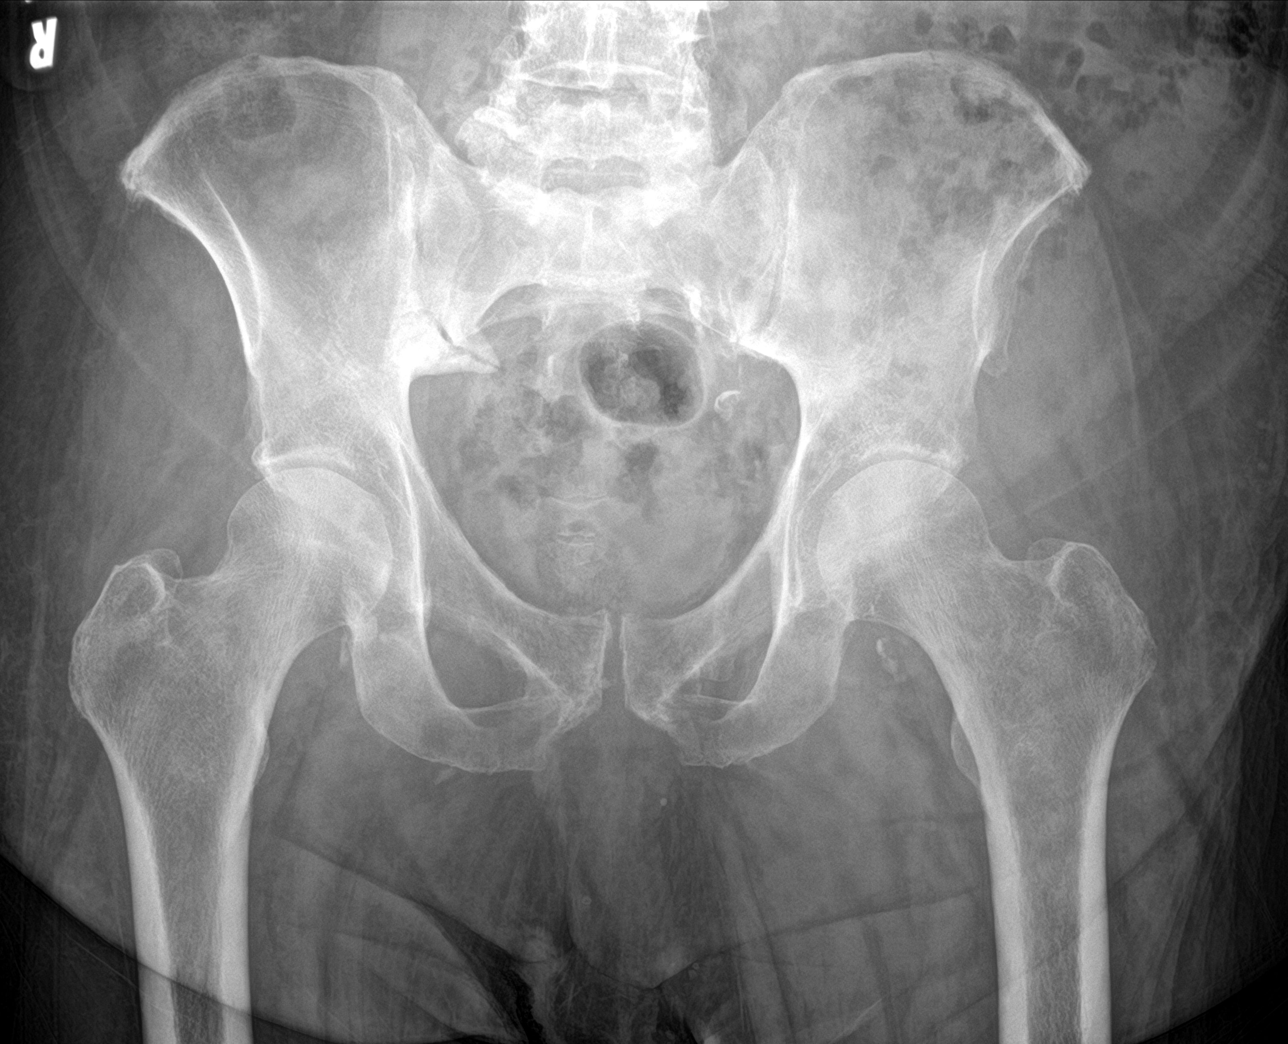

[hip ap]
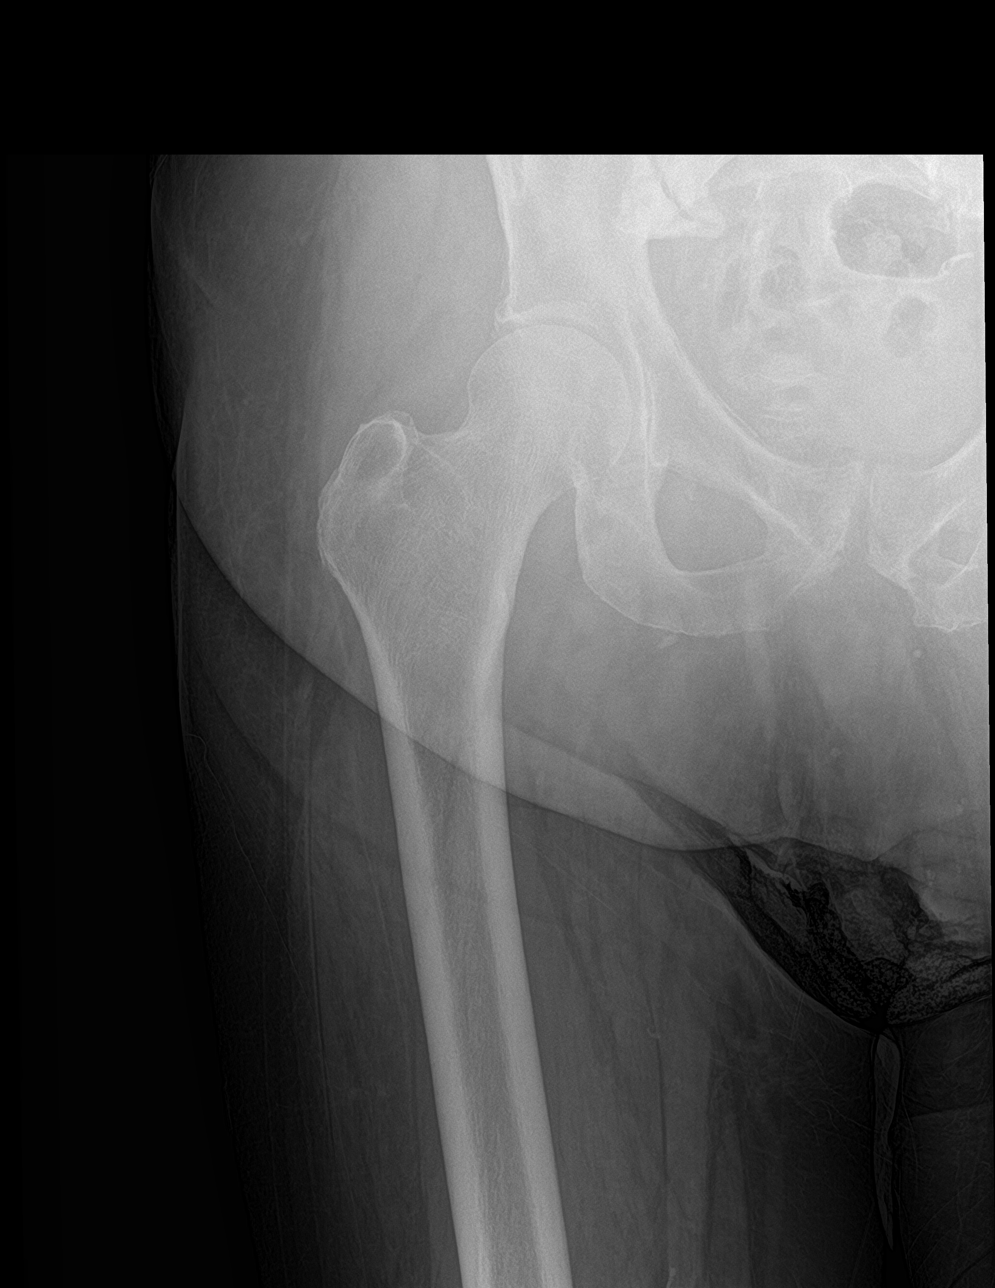

[hip lat]
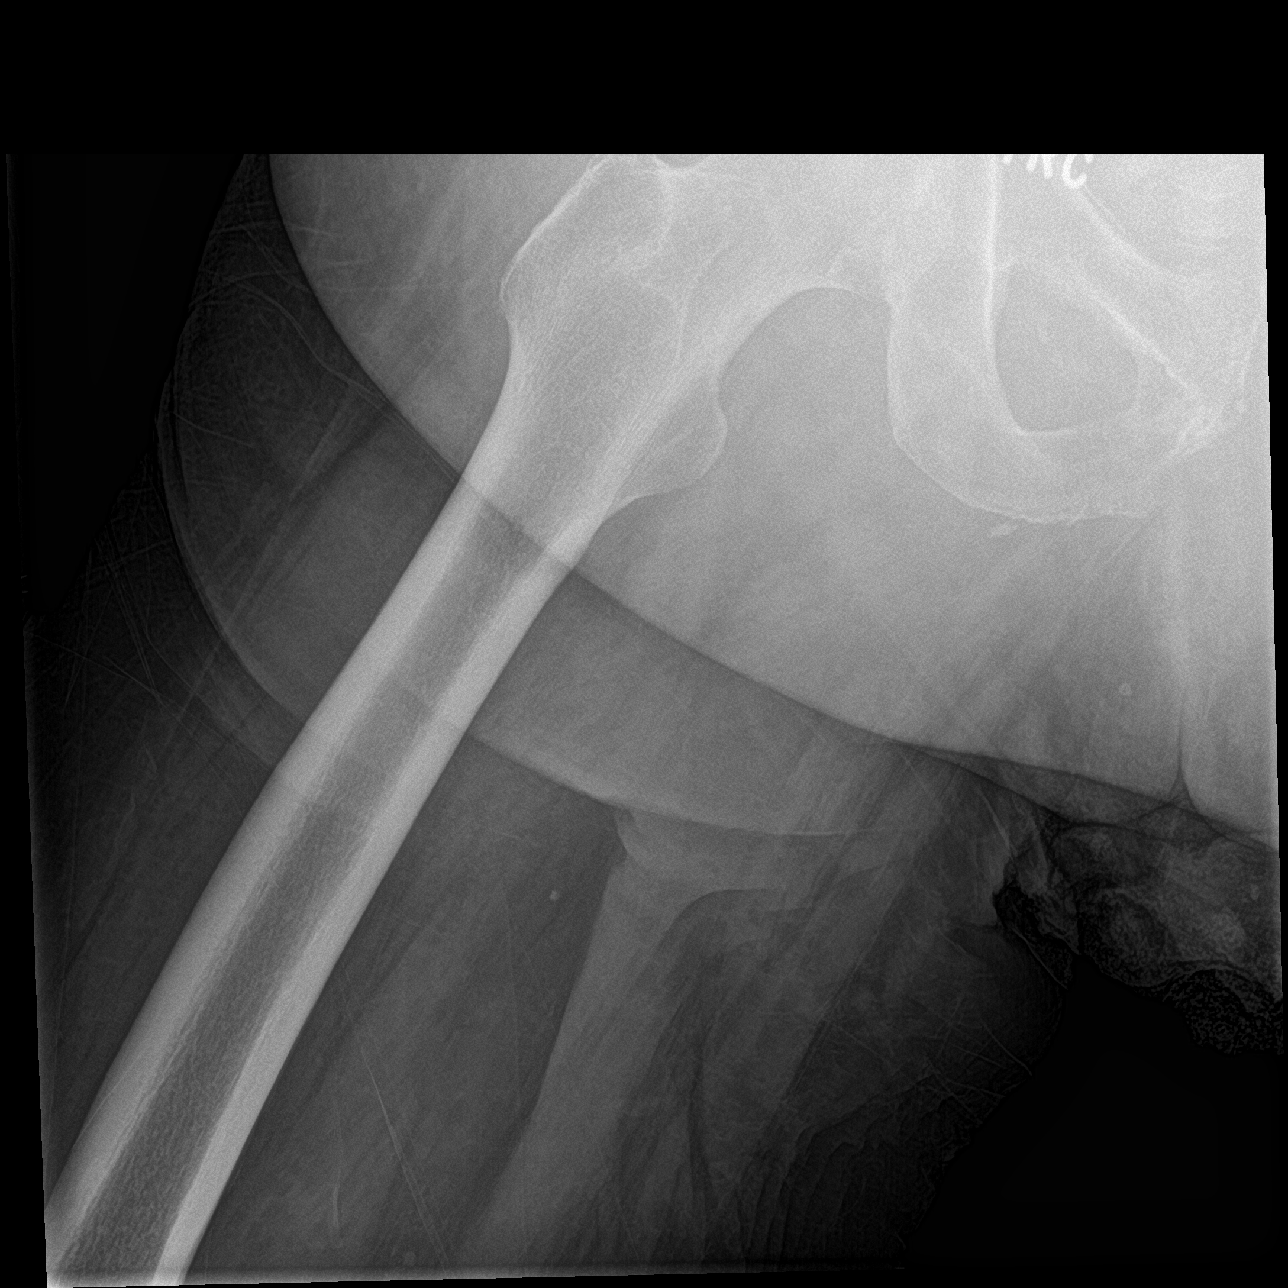

[3 of 3 positions shown; findings below may reference images not displayed]

FINDINGS: There is no evidence of hip fracture or dislocation. There is no
evidence of arthropathy or other focal bone abnormality. Similar
appearing degenerative changes of the visualized lower lumbar spine
and right sacroiliac joint.
IMPRESSION: No acute fracture or malalignment.

## 2022-07-28 ENCOUNTER — Observation Stay (HOSPITAL_BASED_OUTPATIENT_CLINIC_OR_DEPARTMENT_OTHER)
Admission: EM | Admit: 2022-07-28 | Discharge: 2022-07-30 | Disposition: A | Discharge status: Home / Self Care | Payer: Medicare Other | Attending: Internal Medicine | Admitting: Internal Medicine

## 2022-07-28 ENCOUNTER — Other Ambulatory Visit: Payer: Self-pay

## 2022-07-28 ENCOUNTER — Encounter (HOSPITAL_BASED_OUTPATIENT_CLINIC_OR_DEPARTMENT_OTHER): Payer: Self-pay | Admitting: Urology

## 2022-07-28 DIAGNOSIS — Z955 Presence of coronary angioplasty implant and graft: Secondary | ICD-10-CM | POA: Diagnosis not present

## 2022-07-28 DIAGNOSIS — E785 Hyperlipidemia, unspecified: Secondary | ICD-10-CM | POA: Insufficient documentation

## 2022-07-28 DIAGNOSIS — Z7984 Long term (current) use of oral hypoglycemic drugs: Secondary | ICD-10-CM | POA: Diagnosis not present

## 2022-07-28 DIAGNOSIS — Z96653 Presence of artificial knee joint, bilateral: Secondary | ICD-10-CM | POA: Insufficient documentation

## 2022-07-28 DIAGNOSIS — Z8546 Personal history of malignant neoplasm of prostate: Secondary | ICD-10-CM | POA: Diagnosis not present

## 2022-07-28 DIAGNOSIS — Z7902 Long term (current) use of antithrombotics/antiplatelets: Secondary | ICD-10-CM | POA: Diagnosis not present

## 2022-07-28 DIAGNOSIS — Z95810 Presence of automatic (implantable) cardiac defibrillator: Secondary | ICD-10-CM | POA: Diagnosis not present

## 2022-07-28 DIAGNOSIS — Z79899 Other long term (current) drug therapy: Secondary | ICD-10-CM | POA: Diagnosis not present

## 2022-07-28 DIAGNOSIS — I5022 Chronic systolic (congestive) heart failure: Secondary | ICD-10-CM | POA: Diagnosis not present

## 2022-07-28 DIAGNOSIS — E119 Type 2 diabetes mellitus without complications: Secondary | ICD-10-CM | POA: Diagnosis not present

## 2022-07-28 DIAGNOSIS — K922 Gastrointestinal hemorrhage, unspecified: Principal | ICD-10-CM | POA: Insufficient documentation

## 2022-07-28 DIAGNOSIS — Z7985 Long-term (current) use of injectable non-insulin antidiabetic drugs: Secondary | ICD-10-CM | POA: Insufficient documentation

## 2022-07-28 DIAGNOSIS — E1169 Type 2 diabetes mellitus with other specified complication: Secondary | ICD-10-CM | POA: Insufficient documentation

## 2022-07-28 DIAGNOSIS — Z87891 Personal history of nicotine dependence: Secondary | ICD-10-CM | POA: Insufficient documentation

## 2022-07-28 DIAGNOSIS — I2510 Atherosclerotic heart disease of native coronary artery without angina pectoris: Secondary | ICD-10-CM | POA: Diagnosis not present

## 2022-07-28 DIAGNOSIS — I110 Hypertensive heart disease with heart failure: Secondary | ICD-10-CM | POA: Insufficient documentation

## 2022-07-28 DIAGNOSIS — N400 Enlarged prostate without lower urinary tract symptoms: Secondary | ICD-10-CM | POA: Insufficient documentation

## 2022-07-28 DIAGNOSIS — K625 Hemorrhage of anus and rectum: Secondary | ICD-10-CM | POA: Diagnosis present

## 2022-07-28 DIAGNOSIS — G20C Parkinsonism, unspecified: Secondary | ICD-10-CM | POA: Insufficient documentation

## 2022-07-28 DIAGNOSIS — E1140 Type 2 diabetes mellitus with diabetic neuropathy, unspecified: Secondary | ICD-10-CM | POA: Insufficient documentation

## 2022-07-28 LAB — COMPREHENSIVE METABOLIC PANEL
ALT: 8 U/L (ref 0–44)
AST: 23 U/L (ref 15–41)
Albumin: 3.1 g/dL — ABNORMAL LOW (ref 3.5–5.0)
Alkaline Phosphatase: 80 U/L (ref 38–126)
Anion gap: 7 (ref 5–15)
BUN: 22 mg/dL (ref 8–23)
CO2: 29 mmol/L (ref 22–32)
Calcium: 8.5 mg/dL — ABNORMAL LOW (ref 8.9–10.3)
Chloride: 99 mmol/L (ref 98–111)
Creatinine, Ser: 1.23 mg/dL (ref 0.61–1.24)
GFR, Estimated: >60 mL/min (ref >60)
Glucose, Bld: 98 mg/dL (ref 70–99)
Potassium: 4.6 mmol/L (ref 3.5–5.1)
Sodium: 135 mmol/L (ref 135–145)
Total Bilirubin: 0.5 mg/dL (ref 0.3–1.2)
Total Protein: 6.9 g/dL (ref 6.5–8.1)

## 2022-07-28 LAB — CBC WITH DIFFERENTIAL/PLATELET
Abs Immature Granulocytes: 0.01 10*3/uL (ref 0.00–0.07)
Basophils Absolute: 0 10*3/uL (ref 0.0–0.1)
Basophils Relative: 1 %
Eosinophils Absolute: 0.6 10*3/uL — ABNORMAL HIGH (ref 0.0–0.5)
Eosinophils Relative: 9 %
HCT: 36.4 % — ABNORMAL LOW (ref 39.0–52.0)
Hemoglobin: 11.9 g/dL — ABNORMAL LOW (ref 13.0–17.0)
Immature Granulocytes: 0 %
Lymphocytes Relative: 20 %
Lymphs Abs: 1.3 10*3/uL (ref 0.7–4.0)
MCH: 32.9 pg (ref 26.0–34.0)
MCHC: 32.7 g/dL (ref 30.0–36.0)
MCV: 100.6 fL — ABNORMAL HIGH (ref 80.0–100.0)
Monocytes Absolute: 0.8 10*3/uL (ref 0.1–1.0)
Monocytes Relative: 12 %
Neutro Abs: 3.9 10*3/uL (ref 1.7–7.7)
Neutrophils Relative %: 58 %
Platelets: 185 10*3/uL (ref 150–400)
RBC: 3.62 MIL/uL — ABNORMAL LOW (ref 4.22–5.81)
RDW: 15 % (ref 11.5–15.5)
WBC: 6.5 10*3/uL (ref 4.0–10.5)
nRBC: 0 % (ref 0.0–0.2)

## 2022-07-28 LAB — CBC
HCT: 38 % — ABNORMAL LOW (ref 39.0–52.0)
Hemoglobin: 12.3 g/dL — ABNORMAL LOW (ref 13.0–17.0)
MCH: 32.5 pg (ref 26.0–34.0)
MCHC: 32.4 g/dL (ref 30.0–36.0)
MCV: 100.3 fL — ABNORMAL HIGH (ref 80.0–100.0)
Platelets: 178 10*3/uL (ref 150–400)
RBC: 3.79 MIL/uL — ABNORMAL LOW (ref 4.22–5.81)
RDW: 14.8 % (ref 11.5–15.5)
WBC: 6.2 10*3/uL (ref 4.0–10.5)
nRBC: 0 % (ref 0.0–0.2)

## 2022-07-28 MED ORDER — SODIUM CHLORIDE 0.9 % IV BOLUS
500.0000 mL | Freq: Once | INTRAVENOUS | Status: AC
  Administered 2022-07-28: 500 mL via INTRAVENOUS

## 2022-07-28 NOTE — ED Notes (Signed)
Report given to [facility]. 

## 2022-07-28 NOTE — ED Notes (Signed)
Followed up on consult status and currently a delay.

## 2022-07-28 NOTE — ED Provider Notes (Addendum)
[facility] Provider Note   CSN: [id number] Arrival date & time: 07/28/22  1720     History  Chief Complaint  Patient presents with   Rectal Bleeding    Johnny Ochoa. is a 77 y.o. male.   Rectal Bleeding Patient presents with rectal bleeding.  Had episode last night and again today.  States bright red blood.  Some darker stools.  States red blood with wiping.  No abdominal pain.  Does have some chronic pain.  On Plavix for heart issues.  Has seen the [place] and reportedly last week did have a low blood pressure.  States it was in the 90s and then went to see cardiology and it was better.  Reviewing notes it appears his hemoglobin was 12.6 on the seventh with today being the 11th.      Past Medical History:  Diagnosis Date   AICD (automatic cardioverter/defibrillator) present    Arthritis    Cancer (HCC)    prostate  (Radiation only)   Cellulitis    CHF (congestive heart failure) (HCC)    Complication of anesthesia    slow to come out of anesthesia   Coronary artery disease    Diabetes mellitus    Dyspnea    GERD (gastroesophageal reflux disease)    Gout    Hypertension    Kidney stones    Leg swelling    Low back pain    Myocardial infarction (HCC) 12/09/1993   Obese    OSA (obstructive sleep apnea)    Peripheral neuropathy    PTSD (post-traumatic stress disorder)     Home Medications Prior to Admission medications   Medication Sig Start Date End Date Taking? Authorizing Provider  acetaminophen (TYLENOL) 325 MG tablet Take 650 mg by mouth every 6 (six) hours as needed for moderate pain (pain).    [provider]  acetaminophen (TYLENOL) 500 MG tablet Take 2 tablets (1,000 mg total) by mouth every 6 (six) hours as needed. 08/01/21 08/01/22  Clois Dupes, PA-C  albuterol (PROVENTIL HFA;VENTOLIN HFA) 108 (90 BASE) MCG/ACT inhaler Inhale 2 puffs into the lungs every 4 (four) hours as needed for wheezing or  shortness of breath. 08/24/13   Gilda Crease, MD  allopurinol (ZYLOPRIM) 100 MG tablet Take 100 mg by mouth daily. 06/09/21   [provider]  ascorbic acid (VITAMIN C) 500 MG tablet Take 500 mg by mouth daily. 11/30/19   [provider]  atorvastatin (LIPITOR) 80 MG tablet Take 40 mg by mouth at bedtime.     [provider]  carbidopa-levodopa (SINEMET IR) 25-100 MG tablet Take 1 tablet by mouth in the morning, at noon, and at bedtime. 06/02/21   [provider]  Cholecalciferol 50 MCG (2000 UT) TABS Take 2,000 Units by mouth daily. 11/30/19   [provider]  clopidogrel (PLAVIX) 75 MG tablet Take 75 mg by mouth daily. 02/16/16   [provider]  colchicine 0.6 MG tablet TAKE 1 TABLET BY MOUTH DAILY 04/27/20   Park Liter, DPM  DULoxetine (CYMBALTA) 60 MG capsule Take 60 mg by mouth daily.     [provider]  empagliflozin (JARDIANCE) 25 MG TABS tablet Take 25 mg by mouth daily. 02/27/21   [provider]  ezetimibe (ZETIA) 10 MG tablet Take 10 mg by mouth daily. 02/27/21   [provider]  Ferrous Sulfate (IRON) 325 (65 Fe) MG TABS Take 325 mg by mouth daily.  Patient not taking: Reported on 07/17/2021    [provider]  furosemide (LASIX) 40 MG tablet Take 1 tablet (40 mg total) by mouth daily. Take an extra dose if you gain 2 lbs in 24 hours. 02/28/16   Rama, Maryruth Bun, MD  gabapentin (NEURONTIN) 400 MG capsule Take 400 mg by mouth 3 (three) times daily.     [provider]  glucose 4 GM chewable tablet Chew 1 tablet by mouth as needed for low blood sugar.    [provider]  indomethacin (INDOCIN) 50 MG capsule TAKE 1 CAPSULE(50 MG) BY MOUTH TWICE DAILY WITH A MEAL Patient not taking: Reported on 07/17/2021 08/02/18   Hyatt, Max T, DPM  isosorbide mononitrate (IMDUR) 60 MG 24 hr tablet Take 1 tablet (60 mg total) by mouth daily. 07/22/20 07/31/21  Noralee Stain, DO  Magnesium 400 MG  TABS Take 400 mg by mouth daily.    [provider]  methocarbamol (ROBAXIN) 500 MG tablet Take 1 tablet (500 mg total) by mouth every 6 (six) hours as needed for muscle spasms. 08/01/21   Clois Dupes, PA-C  metoprolol succinate (TOPROL-XL) 100 MG 24 hr tablet Take 100 mg by mouth daily. Take with or immediately following a meal.    [provider]  Multiple Vitamin (MULTIVITAMIN WITH MINERALS) TABS tablet Take 1 tablet by mouth daily.    [provider]  nitroGLYCERIN (NITROSTAT) 0.4 MG SL tablet Place 0.4 mg under the tongue every 5 (five) minutes as needed for chest pain.     [provider]  omeprazole (PRILOSEC) 20 MG capsule Take 20 mg by mouth daily.    [provider]  ondansetron (ZOFRAN) 4 MG tablet Take 1 tablet (4 mg total) by mouth every 8 (eight) hours as needed for nausea or vomiting. 08/01/21 08/01/22  Clois Dupes, PA-C  ondansetron (ZOFRAN-ODT) 4 MG disintegrating tablet 4mg  ODT q4 hours prn nausea/vomit 01/01/22   Charlynne Pander, MD  oxybutynin (DITROPAN-XL) 5 MG 24 hr tablet Take 5 mg by mouth daily. 04/21/21   [provider]  potassium chloride (K-DUR,KLOR-CON) 10 MEQ tablet Take 10 mEq by mouth daily.    [provider]  primidone (MYSOLINE) 50 MG tablet Take 150 mg by mouth daily.    [provider]  sacubitril-valsartan (ENTRESTO) 49-51 MG Take 1 tablet 2 (two) times daily by mouth.    [provider]  Semaglutide, 2 MG/DOSE, (OZEMPIC, 2 MG/DOSE,) 8 MG/3ML SOPN Inject 2 mg into the skin once a week 05/27/21     spironolactone (ALDACTONE) 25 MG tablet Take 25 mg by mouth daily. 02/16/16   [provider]  tamsulosin (FLOMAX) 0.4 MG CAPS capsule Take 0.8 mg by mouth at bedtime.     [provider]  terbinafine (LAMISIL) 1 % cream Apply 1 application topically daily as needed for itching.    [provider]  traZODone (DESYREL) 150 MG tablet Take 150 mg by mouth at  bedtime.    [provider]      Allergies    Metformin and Metformin hcl    Review of Systems   Review of Systems  Gastrointestinal:  Positive for hematochezia.    Physical Exam Updated Vital Signs BP 96/61   Pulse 83   Temp 97.8 F (36.6 C)   Resp 20   Ht 5' 6.5" (1.689 m)   Wt 99.8 kg   SpO2 99%   BMI 34.98 kg/m  Physical Exam Vitals reviewed.  Eyes:     Pupils: Pupils are equal, round, and reactive to light.  Cardiovascular:     Rate and Rhythm: Regular rhythm.  Pulmonary:     Breath sounds: No wheezing.  Abdominal:     Tenderness: There is no abdominal tenderness.  Musculoskeletal:     Cervical back: Neck supple.  Neurological:     Mental Status: He is alert.     ED Results / Procedures / Treatments   Labs (all labs ordered are listed, but only abnormal results are displayed) Labs Reviewed  CBC WITH DIFFERENTIAL/PLATELET - Abnormal; Notable for the following components:      Result Value   RBC 3.62 (*)    Hemoglobin 11.9 (*)    HCT 36.4 (*)    MCV 100.6 (*)    Eosinophils Absolute 0.6 (*)    All other components within normal limits  COMPREHENSIVE METABOLIC PANEL - Abnormal; Notable for the following components:   Calcium 8.5 (*)    Albumin 3.1 (*)    All other components within normal limits    EKG None  Radiology No results found.  Procedures Procedures    Medications Ordered in ED Medications  sodium chloride 0.9 % bolus 500 mL (has no administration in time range)    ED Course/ Medical Decision Making/ A&P                             Medical Decision Making Amount and/or Complexity of Data Reviewed Labs: ordered.  Risk Decision regarding hospitalization.   Patient with GI bleed.  Likely lower GI bleed since it is red blood.  Initial blood pressure measurement likely an error and that it was 180/140.  Repeat blood pressure was hypotensive with pressures of 96 systolic but sounds like she had somewhat this last week.   Hemoglobin has gone from 12.6 down to 11.9 over 4 days.  Patient was not orthostatic.  Did have gross purpleish blood on rectal exam.  Has had low blood pressure since before the bleeding started.  Denies any urinary symptoms.  Benign abdominal exam.  Think patient benefit from admission to the hospital.  Will discuss with hospitalist. Does not appear overly hypovolemic at this time.  Awaiting callback from hospitalist.  Still appears stable.  Will repeat hemoglobin to check for change.       Final Clinical Impression(s) / ED Diagnoses Final diagnoses:  Lower GI bleed    Rx / DC Orders ED Discharge Orders     None         Benjiman Core, MD 07/28/22 2015    Benjiman Core, MD 07/28/22 2116

## 2022-07-28 NOTE — ED Notes (Signed)
Attempted to give report to RN @ [facility], RN to called me back

## 2022-07-28 NOTE — ED Notes (Signed)
[facility] called for transport @22 :17

## 2022-07-28 NOTE — ED Notes (Signed)
Report given to [facility] RN @ [facility]

## 2022-07-28 NOTE — ED Triage Notes (Signed)
Pt states bright red blood in stool and dark stools x 2 days  Bright red blood with wiping  Denies any abd pain, just reports his chronic pain  Pt takes plavix

## 2022-07-28 NOTE — Progress Notes (Signed)
Plan of Care Note for accepted transfer   Patient: Johnny Ochoa. MRN: [id number]   DOA: 07/28/2022  Facility requesting transfer: [facility] ED Requesting Provider: Dr. Rubin Payor Reason for transfer: Hematochezia Facility course: 77 year old male with past medical history of PTSD, morbid obesity, OSA on CPAP, CAD, diabetes, hypertension, hyperlipidemia, prostate cancer, chronic HFrEF status post AICD, GERD, diverticulosis on colonoscopy done in 2019 presented to ED with complaint of hematochezia x 2 days.  He is on Plavix.  No episodes of hematochezia in the ED but did have purple blood on rectal exam.  Slightly hypotensive with systolic in the 90s and was given 500 cc IV fluids after which blood pressure improved, most recent 106/65.  Not tachycardic.  Hemoglobin 11.9, down from 14.3 on labs done in November 2023.  Reportedly hemoglobin was 12.6 on outpatient labs done 4 days ago.  ED physician feels the patient is currently stable with no need for urgent GI consultation at this time.  Please consult GI when patient arrives to the hospital.  Plan of care: The patient is accepted for admission to Progressive unit, at [facility].  [facility] will assume care on arrival to accepting facility. Until arrival, care as per EDP. However, [facility] available 24/7 for questions and assistance.   Author: John Giovanni, MD 07/28/2022  Check [email or web address] for on-call coverage.  Nursing staff, Please call [facility] Admits & Consults System-Wide number on Amion as soon as patient's arrival, so appropriate admitting provider can evaluate the pt.

## 2022-07-29 DIAGNOSIS — K922 Gastrointestinal hemorrhage, unspecified: Secondary | ICD-10-CM

## 2022-07-29 LAB — CBC
HCT: 35.8 % — ABNORMAL LOW (ref 39.0–52.0)
HCT: 38.1 % — ABNORMAL LOW (ref 39.0–52.0)
Hemoglobin: 11.7 g/dL — ABNORMAL LOW (ref 13.0–17.0)
Hemoglobin: 12.4 g/dL — ABNORMAL LOW (ref 13.0–17.0)
MCH: 33.1 pg (ref 26.0–34.0)
MCH: 32.9 pg (ref 26.0–34.0)
MCHC: 32.7 g/dL (ref 30.0–36.0)
MCHC: 32.5 g/dL (ref 30.0–36.0)
MCV: 101.4 fL — ABNORMAL HIGH (ref 80.0–100.0)
MCV: 101.1 fL — ABNORMAL HIGH (ref 80.0–100.0)
Platelets: 162 10*3/uL (ref 150–400)
Platelets: 194 10*3/uL (ref 150–400)
RBC: 3.53 MIL/uL — ABNORMAL LOW (ref 4.22–5.81)
RBC: 3.77 MIL/uL — ABNORMAL LOW (ref 4.22–5.81)
RDW: 14.8 % (ref 11.5–15.5)
RDW: 14.8 % (ref 11.5–15.5)
WBC: 5.1 10*3/uL (ref 4.0–10.5)
WBC: 5.5 10*3/uL (ref 4.0–10.5)
nRBC: 0 % (ref 0.0–0.2)
nRBC: 0 % (ref 0.0–0.2)

## 2022-07-29 LAB — MAGNESIUM: Magnesium: 2.3 mg/dL (ref 1.7–2.4)

## 2022-07-29 LAB — BASIC METABOLIC PANEL
Anion gap: 7 (ref 5–15)
BUN: 17 mg/dL (ref 8–23)
CO2: 27 mmol/L (ref 22–32)
Calcium: 8.5 mg/dL — ABNORMAL LOW (ref 8.9–10.3)
Chloride: 101 mmol/L (ref 98–111)
Creatinine, Ser: 1.07 mg/dL (ref 0.61–1.24)
GFR, Estimated: >60 mL/min (ref >60)
Glucose, Bld: 86 mg/dL (ref 70–99)
Potassium: 4.4 mmol/L (ref 3.5–5.1)
Sodium: 135 mmol/L (ref 135–145)

## 2022-07-29 LAB — PHOSPHORUS: Phosphorus: 3.4 mg/dL (ref 2.5–4.6)

## 2022-07-29 MED ORDER — METHOCARBAMOL 500 MG PO TABS: 500.0000 mg | ORAL_TABLET | Freq: Four times a day (QID) | ORAL | Status: DC | PRN

## 2022-07-29 MED ORDER — PRIMIDONE 50 MG PO TABS
25.0000 mg | ORAL_TABLET | Freq: Two times a day (BID) | ORAL | Status: DC
  Administered 2022-07-29 – 2022-07-30 (×3): 25 mg via ORAL
  Filled 2022-07-29 (×3): qty 0.5

## 2022-07-29 MED ORDER — COLCHICINE 0.6 MG PO TABS: 0.6000 mg | ORAL_TABLET | Freq: Every day | ORAL | Status: DC

## 2022-07-29 MED ORDER — POLYETHYLENE GLYCOL 3350 17 G PO PACK: 17.0000 g | PACK | Freq: Every day | ORAL | 0 refills | Status: AC | PRN

## 2022-07-29 MED ORDER — EZETIMIBE 10 MG PO TABS
10.0000 mg | ORAL_TABLET | Freq: Every day | ORAL | Status: DC
  Administered 2022-07-29 – 2022-07-30 (×2): 10 mg via ORAL
  Filled 2022-07-29 (×2): qty 1

## 2022-07-29 MED ORDER — PROCHLORPERAZINE EDISYLATE 10 MG/2ML IJ SOLN: 5.0000 mg | Freq: Four times a day (QID) | INTRAMUSCULAR | Status: DC | PRN

## 2022-07-29 MED ORDER — POLYETHYLENE GLYCOL 3350 17 G PO PACK: 17.0000 g | PACK | Freq: Every day | ORAL | Status: DC | PRN

## 2022-07-29 MED ORDER — GABAPENTIN 400 MG PO CAPS
400.0000 mg | ORAL_CAPSULE | Freq: Three times a day (TID) | ORAL | Status: DC
  Administered 2022-07-29 – 2022-07-30 (×4): 400 mg via ORAL
  Filled 2022-07-29 (×4): qty 1

## 2022-07-29 MED ORDER — OXYBUTYNIN CHLORIDE ER 5 MG PO TB24
5.0000 mg | ORAL_TABLET | Freq: Two times a day (BID) | ORAL | Status: DC
  Administered 2022-07-29 – 2022-07-30 (×3): 5 mg via ORAL
  Filled 2022-07-29 (×3): qty 1

## 2022-07-29 MED ORDER — SODIUM CHLORIDE 0.9 % IV SOLN: INTRAVENOUS | Status: DC

## 2022-07-29 MED ORDER — PANTOPRAZOLE SODIUM 40 MG PO TBEC
40.0000 mg | DELAYED_RELEASE_TABLET | Freq: Every day | ORAL | Status: DC
  Administered 2022-07-29 – 2022-07-30 (×2): 40 mg via ORAL
  Filled 2022-07-29 (×2): qty 1

## 2022-07-29 MED ORDER — ORAL CARE MOUTH RINSE: 15.0000 mL | OROMUCOSAL | Status: DC | PRN

## 2022-07-29 MED ORDER — MELATONIN 5 MG PO TABS: 5.0000 mg | ORAL_TABLET | Freq: Every evening | ORAL | Status: DC | PRN

## 2022-07-29 MED ORDER — DULOXETINE HCL 60 MG PO CPEP
60.0000 mg | ORAL_CAPSULE | Freq: Every day | ORAL | Status: DC
  Administered 2022-07-29 – 2022-07-30 (×2): 60 mg via ORAL
  Filled 2022-07-29 (×2): qty 1

## 2022-07-29 MED ORDER — SENNOSIDES-DOCUSATE SODIUM 8.6-50 MG PO TABS: 1.0000 | ORAL_TABLET | Freq: Two times a day (BID) | ORAL | 2 refills | Status: DC

## 2022-07-29 MED ORDER — CLOPIDOGREL BISULFATE 75 MG PO TABS: 75.0000 mg | ORAL_TABLET | Freq: Every day | ORAL | 3 refills | Status: AC

## 2022-07-29 MED ORDER — ACETAMINOPHEN 325 MG PO TABS: 650.0000 mg | ORAL_TABLET | Freq: Four times a day (QID) | ORAL | Status: DC | PRN

## 2022-07-29 MED ORDER — ALLOPURINOL 100 MG PO TABS
100.0000 mg | ORAL_TABLET | Freq: Two times a day (BID) | ORAL | Status: DC
  Administered 2022-07-29 – 2022-07-30 (×3): 100 mg via ORAL
  Filled 2022-07-29 (×3): qty 1

## 2022-07-29 MED ORDER — TAMSULOSIN HCL 0.4 MG PO CAPS
0.8000 mg | ORAL_CAPSULE | Freq: Every day | ORAL | Status: DC
  Administered 2022-07-29: 0.8 mg via ORAL
  Filled 2022-07-29: qty 2

## 2022-07-29 MED ORDER — ATORVASTATIN CALCIUM 40 MG PO TABS
40.0000 mg | ORAL_TABLET | Freq: Every day | ORAL | Status: DC
  Administered 2022-07-29: 40 mg via ORAL
  Filled 2022-07-29: qty 1

## 2022-07-29 MED ORDER — ADULT MULTIVITAMIN W/MINERALS CH
1.0000 | ORAL_TABLET | Freq: Every day | ORAL | Status: DC
  Administered 2022-07-29 – 2022-07-30 (×2): 1 via ORAL
  Filled 2022-07-29 (×2): qty 1

## 2022-07-29 MED ORDER — CARBIDOPA-LEVODOPA 25-100 MG PO TABS
1.0000 | ORAL_TABLET | Freq: Three times a day (TID) | ORAL | Status: DC
  Administered 2022-07-29 – 2022-07-30 (×3): 1 via ORAL
  Filled 2022-07-29 (×3): qty 1

## 2022-07-29 NOTE — Progress Notes (Signed)
Transition of Care [facility]) - Inpatient Brief Assessment   Patient Details  Name: Johnny Ochoa. MRN: [id number] Date of Birth: 05-21-1945  Transition of Care [facility]) CM/SW Contact:    Larrie Kass, LCSW Phone Number: 07/29/2022, 3:06 PM   Clinical Narrative:  Transition of Care Department [facility]) has reviewed patient and no TOC needs have been identified at this time. We will continue to monitor patient advancement through interdisciplinary progression rounds. If new patient transition needs arise, please place a TOC consult.  Transition of Care Asessment: Insurance and Status: Insurance coverage has been reviewed Patient has primary care physician: Yes Home environment has been reviewed: yes Prior level of function:: home Prior/Current Home Services: No current home services Social Determinants of Health Reivew: SDOH reviewed no interventions necessary Readmission risk has been reviewed: Yes Transition of care needs: no transition of care needs at this time

## 2022-07-29 NOTE — H&P (Signed)
History and Physical  Johnny Ochoa. ZOX:[id number] DOB: 1945-07-08 DOA: 07/28/2022  Referring physician: Accepted by Dr. Loney Loh [facility], hospitalist service. PCP: Irena Reichmann, DO  Outpatient Specialists: Cardiology, urology, vascular surgery. Patient coming from: Home through [facility] ED.  Chief Complaint: Bright red blood per rectum.  HPI: Johnny Ochoa. is a 77 y.o. male with medical history significant for coronary artery disease on Plavix, hypertension, hyperlipidemia, type 2 diabetes, chronic HFrEF, status post AICD placement, GERD, diverticulosis on colonoscopy done in 2019, OSA on CPAP, who initially presented to [facility] ED with complaints of painless bright red blood per rectum which he noticed today after wiping.  States there was bright red blood around his stools.  Denies use of NSAIDs.  He decided to come to the ED to be evaluated.  In the ED, BPs are soft.  The patient denies recurrence of lower GI bleed in the ED.  Hemoglobin 12.3 from baseline of 14.  [facility] was asked to admit.  The patient was admitted by Dr. Loney Loh and transferred to [facility] progressive care unit as observation status.  The patient was seen and examined at his bedside.  Denies any abdominal pain, nausea, or recurrent lower GI bleeding while at [facility].  Was started on maintenance IV fluid normal saline.  Home oral antihypertensives were held due to soft blood pressures.  ED Course: Temperature 97.7.  BP 101/45, pulse 69, respiratory 17, O2 saturation 99% on room air.  Lab studies remarkable for WBC 6.2, hemoglobin 12.3, MCV 100.3.  Platelet 178.  Albumin 3.1.  Review of Systems: Review of systems as noted in the HPI. All other systems reviewed and are negative.   Past Medical History:  Diagnosis Date   AICD (automatic cardioverter/defibrillator) present    Arthritis    Cancer (HCC)    prostate  (Radiation only)   Cellulitis    CHF (congestive heart failure) (HCC)    Complication  of anesthesia    slow to come out of anesthesia   Coronary artery disease    Diabetes mellitus    Dyspnea    GERD (gastroesophageal reflux disease)    Gout    Hypertension    Kidney stones    Leg swelling    Low back pain    Myocardial infarction (HCC) 12/09/1993   Obese    OSA (obstructive sleep apnea)    Peripheral neuropathy    PTSD (post-traumatic stress disorder)    Past Surgical History:  Procedure Laterality Date   CARDIAC DEFIBRILLATOR PLACEMENT     COLONOSCOPY WITH PROPOFOL N/A 10/26/2017   Procedure: COLONOSCOPY WITH PROPOFOL;  Surgeon: Graylin Shiver, MD;  Location: [facility] ENDOSCOPY;  Service: Endoscopy;  Laterality: N/A;   CORONARY ANGIOPLASTY WITH STENT PLACEMENT  1995    x 3 stents   CORONARY PRESSURE/FFR STUDY N/A 07/22/2020   Procedure: INTRAVASCULAR PRESSURE WIRE/FFR STUDY;  Surgeon: Yvonne Kendall, MD;  Location: [facility] INVASIVE CV LAB;  Service: Cardiovascular;  Laterality: N/A;  CFX and RCA    JOINT REPLACEMENT  2004   right total knee replacement   KNEE ARTHROPLASTY Left 07/31/2021   Procedure: COMPUTER ASSISTED TOTAL KNEE ARTHROPLASTY;  Surgeon: Samson Frederic, MD;  Location: [facility] ORS;  Service: Orthopedics;  Laterality: Left;  150   LEFT HEART CATH AND CORONARY ANGIOGRAPHY N/A 07/22/2020   Procedure: LEFT HEART CATH AND CORONARY ANGIOGRAPHY;  Surgeon: Yvonne Kendall, MD;  Location: [facility] INVASIVE CV LAB;  Service: Cardiovascular;  Laterality: N/A;   MULTIPLE  TOOTH EXTRACTIONS     Total Tooth extraction   PACEMAKER INSERTION     PACEMAKER PLACEMENT      Social History:  reports that he quit smoking about 41 years ago. His smoking use included cigarettes. He has a 20.00 pack-year smoking history. He has never used smokeless tobacco. He reports that he does not drink alcohol and does not use drugs.   Allergies  Allergen Reactions   Metformin Diarrhea   Metformin Hcl Other (See Comments)    Family History  Problem Relation Age of Onset   Heart attack Mother     Hypertension Mother    Diabetes Other    Hypertension Other    Heart disease Other    Arthritis Other    Heart disease Sister    Heart disease Brother       Prior to Admission medications   Medication Sig Start Date End Date Taking? Authorizing Provider  acetaminophen (TYLENOL) 325 MG tablet Take 650 mg by mouth every 6 (six) hours as needed for moderate pain (pain).    [provider]  acetaminophen (TYLENOL) 500 MG tablet Take 2 tablets (1,000 mg total) by mouth every 6 (six) hours as needed. 08/01/21 08/01/22  Clois Dupes, PA-C  albuterol (PROVENTIL HFA;VENTOLIN HFA) 108 (90 BASE) MCG/ACT inhaler Inhale 2 puffs into the lungs every 4 (four) hours as needed for wheezing or shortness of breath. 08/24/13   Gilda Crease, MD  allopurinol (ZYLOPRIM) 100 MG tablet Take 100 mg by mouth daily. 06/09/21   [provider]  ascorbic acid (VITAMIN C) 500 MG tablet Take 500 mg by mouth daily. 11/30/19   [provider]  atorvastatin (LIPITOR) 80 MG tablet Take 40 mg by mouth at bedtime.     [provider]  carbidopa-levodopa (SINEMET IR) 25-100 MG tablet Take 1 tablet by mouth in the morning, at noon, and at bedtime. 06/02/21   [provider]  Cholecalciferol 50 MCG (2000 UT) TABS Take 2,000 Units by mouth daily. 11/30/19   [provider]  clopidogrel (PLAVIX) 75 MG tablet Take 75 mg by mouth daily. 02/16/16   [provider]  colchicine 0.6 MG tablet TAKE 1 TABLET BY MOUTH DAILY 04/27/20   Park Liter, DPM  DULoxetine (CYMBALTA) 60 MG capsule Take 60 mg by mouth daily.     [provider]  empagliflozin (JARDIANCE) 25 MG TABS tablet Take 25 mg by mouth daily. 02/27/21   [provider]  ezetimibe (ZETIA) 10 MG tablet Take 10 mg by mouth daily. 02/27/21   [provider]  Ferrous Sulfate (IRON) 325 (65 Fe) MG TABS Take 325 mg by mouth daily. Patient not taking: Reported on 07/17/2021    [provider]  furosemide (LASIX) 40 MG tablet Take 1 tablet (40 mg total) by mouth daily. Take an extra dose if you gain 2 lbs in 24 hours. 02/28/16   Rama, Maryruth Bun, MD  gabapentin (NEURONTIN) 400 MG capsule Take 400 mg by mouth 3 (three) times daily.     [provider]  glucose 4 GM chewable tablet Chew 1 tablet by mouth as needed for low blood sugar.    [provider]  indomethacin (INDOCIN) 50 MG capsule TAKE 1 CAPSULE(50 MG) BY MOUTH TWICE DAILY WITH A MEAL Patient not taking: Reported on 07/17/2021 08/02/18   Hyatt, Max T, DPM  isosorbide mononitrate (IMDUR) 60 MG 24 hr tablet Take 1 tablet (60 mg total) by mouth daily. 07/22/20  07/31/21  Noralee Stain, DO  Magnesium 400 MG TABS Take 400 mg by mouth daily.    [provider]  methocarbamol (ROBAXIN) 500 MG tablet Take 1 tablet (500 mg total) by mouth every 6 (six) hours as needed for muscle spasms. 08/01/21   Clois Dupes, PA-C  metoprolol succinate (TOPROL-XL) 100 MG 24 hr tablet Take 100 mg by mouth daily. Take with or immediately following a meal.    [provider]  Multiple Vitamin (MULTIVITAMIN WITH MINERALS) TABS tablet Take 1 tablet by mouth daily.    [provider]  nitroGLYCERIN (NITROSTAT) 0.4 MG SL tablet Place 0.4 mg under the tongue every 5 (five) minutes as needed for chest pain.     [provider]  omeprazole (PRILOSEC) 20 MG capsule Take 20 mg by mouth daily.    [provider]  ondansetron (ZOFRAN) 4 MG tablet Take 1 tablet (4 mg total) by mouth every 8 (eight) hours as needed for nausea or vomiting. 08/01/21 08/01/22  Clois Dupes, PA-C  ondansetron (ZOFRAN-ODT) 4 MG disintegrating tablet 4mg  ODT q4 hours prn nausea/vomit 01/01/22   Charlynne Pander, MD  oxybutynin (DITROPAN-XL) 5 MG 24 hr tablet Take 5 mg by mouth daily. 04/21/21   [provider]  potassium chloride (K-DUR,KLOR-CON) 10 MEQ tablet Take 10 mEq by mouth daily.    [provider]   primidone (MYSOLINE) 50 MG tablet Take 150 mg by mouth daily.    [provider]  sacubitril-valsartan (ENTRESTO) 49-51 MG Take 1 tablet 2 (two) times daily by mouth.    [provider]  Semaglutide, 2 MG/DOSE, (OZEMPIC, 2 MG/DOSE,) 8 MG/3ML SOPN Inject 2 mg into the skin once a week 05/27/21     spironolactone (ALDACTONE) 25 MG tablet Take 25 mg by mouth daily. 02/16/16   [provider]  tamsulosin (FLOMAX) 0.4 MG CAPS capsule Take 0.8 mg by mouth at bedtime.     [provider]  terbinafine (LAMISIL) 1 % cream Apply 1 application topically daily as needed for itching.    [provider]  traZODone (DESYREL) 150 MG tablet Take 150 mg by mouth at bedtime.    [provider]    Physical Exam: BP 112/66 (BP Location: Left Arm)   Pulse 70   Temp 97.7 F (36.5 C) (Oral)   Resp 18   Ht 5' 6.5" (1.689 m)   Wt 99.8 kg   SpO2 99%   BMI 34.98 kg/m   General: 77 y.o. year-old male well developed well nourished in no acute distress.  Alert and oriented x3. Cardiovascular: Regular rate and rhythm with no rubs or gallops.  No thyromegaly or JVD noted.  No lower extremity edema. 2/4 pulses in all 4 extremities. Respiratory: Clear to auscultation with no wheezes or rales. Good inspiratory effort. Abdomen: Soft nontender nondistended with normal bowel sounds x4 quadrants. Muskuloskeletal: No cyanosis, clubbing or edema noted bilaterally Neuro: CN II-XII intact, strength, sensation, reflexes Skin: No ulcerative lesions noted or rashes Psychiatry: Judgement and insight appear normal. Mood is appropriate for condition and setting          Labs on Admission:  Basic Metabolic Panel: Recent Labs  Lab 07/28/22 1802  NA 135  K 4.6  CL 99  CO2 29  GLUCOSE 98  BUN 22  CREATININE 1.23  CALCIUM 8.5*   Liver Function Tests: Recent Labs  Lab 07/28/22 1802  AST 23  ALT 8  ALKPHOS 80  BILITOT 0.5  PROT 6.9  ALBUMIN 3.1*   No results for  input(s): "LIPASE", "AMYLASE" in the last 168 hours. No results for input(s): "AMMONIA" in the last 168 hours. CBC: Recent Labs  Lab 07/28/22 1802 07/28/22 2140  WBC 6.5 6.2  NEUTROABS 3.9  --   HGB 11.9* 12.3*  HCT 36.4* 38.0*  MCV 100.6* 100.3*  PLT 185 178   Cardiac Enzymes: No results for input(s): "CKTOTAL", "CKMB", "CKMBINDEX", "TROPONINI" in the last 168 hours.  BNP (last 3 results) No results for input(s): "BNP" in the last 8760 hours.  ProBNP (last 3 results) No results for input(s): "PROBNP" in the last 8760 hours.  CBG: No results for input(s): "GLUCAP" in the last 168 hours.  Radiological Exams on Admission: No results found.  EKG: I independently viewed the EKG done and my findings are as followed: None available at the time of this visit.  Assessment/Plan Present on Admission:  Acute lower GI bleeding  Principal Problem:   Acute lower GI bleeding  Acute lower GI bleed, suspect diverticular bleed Endorses painless lower GI bleed which appears to have resolved No recurrence of lower GI bleed since in the ED or at [facility], at the time of the dictation Denies any abdominal pain or use of NSAIDs Hemoglobin stable at this time 12.3 Repeat CBC in the morning  Coronary artery disease on Plavix Hold off home Plavix due to lower GI bleed Resume home Lipitor and Zetia  Type 2 diabetes with hyperlipidemia Last hemoglobin A1c 6.6 on 07/21/2021. Hold off home oral hypoglycemics to avoid hypoglycemia  Diabetic polyneuropathy Resume home regimen  GERD Resume home PPI  BPH Resume home Flomax monitor urine output  Chronic anxiety/depression Resume home duloxetine.    DVT prophylaxis: SCDs  Code Status: Full code  Family Communication: None at bedside.  Disposition Plan: Admitted to progressive care unit  Consults called: None.  Admission status: Observation status.   Status is: Observation    Darlin Drop MD [facility] Pager [phone]  If 7PM-7AM, please contact night-coverage [email or web address] Password [facility]  07/29/2022, 2:44 AM

## 2022-07-29 NOTE — Care Management Obs Status (Signed)
MEDICARE OBSERVATION STATUS NOTIFICATION   Patient Details  Name: Johnny Ochoa. MRN: [id number] Date of Birth: September 29, 1945   Medicare Observation Status Notification Given:  Yes    Larrie Kass, LCSW 07/29/2022, 2:59 PM

## 2022-07-29 NOTE — Progress Notes (Signed)
[facility] PROGRESS NOTE   [name]. ZOX:[id number] DOB: Jul 29, 1945 DOA: 07/28/2022  PCP: Irena Reichmann, DO  Brief History/Interval Summary: 77 y.o. male with medical history significant for coronary artery disease on Plavix, hypertension, hyperlipidemia, type 2 diabetes, chronic HFrEF, status post AICD placement, GERD, diverticulosis on colonoscopy done in 2019, OSA on CPAP, who initially presented to [facility] ED with complaints of painless bright red blood per rectum which he noticed today after wiping.  States there was bright red blood around his stools.  Denies use of NSAIDs.  He decided to come to the ED to be evaluated.   In the ED, BPs are soft.  The patient denies recurrence of lower GI bleed in the ED.  Hemoglobin 12.3 from baseline of 14.  TR[facility] was asked to admit.  The patient was admitted by Dr. Loney Loh and transferred to [facility] progressive care unit as observation status.   The patient was seen and examined at his bedside.  Denies any abdominal pain, nausea, or recurrent lower GI bleeding while at [facility].  Was started on maintenance IV fluid normal saline.  Home oral antihypertensives were held due to soft blood pressures.   ED Course: Temperature 97.7.  BP 101/45, pulse 69, respiratory 17, O2 saturation 99% on room air.  Lab studies remarkable for WBC 6.2, hemoglobin 12.3, MCV 100.3.  Platelet 178.  Albumin 3.1.   Consultants: None  Procedures:  None    Subjective/Interval History: Patient feels better. Did notice some blood on his chair after he sat there for a few hours. No bloody BM yet.   Assessment/Plan:  Acute lower GI bleed, suspect diverticular bleed Patient with painless hematochezia which appears to have resolved.  Last colonoscopy from 2019 (by [facility] gastroenterology) showed only diverticulosis.  This was likely a diverticular bleed.  Hemoglobin did drop some but stable.  No bloody BM since yesterday but did note some  blood on chair this AM. Will recheck Hgb later today and monitor another day.     Coronary artery disease on Plavix Plavix on hold. He does have coronary stents but the last stent was placed more than 2 years ago.  Stable otherwise.   Parkinson's disease Continue with Sinemet   Type 2 diabetes with hyperlipidemia May resume home medications at discharge   Diabetic polyneuropathy Resume home regimen   GERD Resume home PPI   BPH Resume home Flomax monitor urine output   Chronic anxiety/depression Resume home duloxetine.   Obesity Estimated body mass index is 34.98 kg/m as calculated from the following:   Height as of this encounter: 5' 6.5" (1.689 m).   Weight as of this encounter: 99.8 kg.    DVT Prophylaxis:  SCD Code Status:  Full  Family Communication:  D/w wife Disposition Plan:  Home when better  Status is: Observation The patient remains OBS appropriate and will d/c before 2 midnights.      Medications: Scheduled:  allopurinol  100 mg Oral BID   atorvastatin  40 mg Oral QHS   carbidopa-levodopa  1 tablet Oral TID   DULoxetine  60 mg Oral Daily   ezetimibe  10 mg Oral Daily   gabapentin  400 mg Oral TID   multivitamin with minerals  1 tablet Oral Daily   oxybutynin  5 mg Oral BID   pantoprazole  40 mg Oral Daily   primidone  25 mg Oral BID   tamsulosin  0.8 mg Oral QHS   Continuous: WJX:[id number],  melatonin, methocarbamol, mouth rinse, polyethylene glycol, prochlorperazine  Antibiotics: Anti-infectives (From admission, onward)    None       Objective:  Vital Signs  Vitals:   07/28/22 2230 07/28/22 2345 07/29/22 0418 07/29/22 0905  BP: 111/60 112/66 (!) 101/45 120/71  Pulse: 69 70 69 76  Resp: 18 18 17 18   Temp:  97.7 F (36.5 C) 97.7 F (36.5 C) 98.2 F (36.8 C)  TempSrc:  Oral Oral Oral  SpO2: 95% 99% 99% 100%  Weight:      Height:        Intake/Output Summary (Last 24 hours) at 07/29/2022 1223 Last data filed at 07/29/2022  0900 Gross per 24 hour  Intake 830.37 ml  Output --  Net 830.37 ml   Filed Weights   07/28/22 1725  Weight: 99.8 kg    General appearance: Awake alert.  In no distress Resp: Clear to auscultation bilaterally.  Normal effort Cardio: S1-S2 is normal regular.  No S3-S4.  No rubs murmurs or bruit GI: Abdomen is soft.  Nontender nondistended.  Bowel sounds are present normal.  No masses organomegaly    Lab Results:  Data Reviewed: I have personally reviewed following labs and reports of the imaging studies  CBC: Recent Labs  Lab 07/28/22 1802 07/28/22 2140 07/29/22 0634  WBC 6.5 6.2 5.1  NEUTROABS 3.9  --   --   HGB 11.9* 12.3* 11.7*  HCT 36.4* 38.0* 35.8*  MCV 100.6* 100.3* 101.4*  PLT 185 178 162    Basic Metabolic Panel: Recent Labs  Lab 07/28/22 1802 07/29/22 0634  NA 135 135  K 4.6 4.4  CL 99 101  CO2 29 27  GLUCOSE 98 86  BUN 22 17  CREATININE 1.23 1.07  CALCIUM 8.5* 8.5*  MG  --  2.3  PHOS  --  3.4    GFR: Estimated Creatinine Clearance: 64.5 mL/min (by C-G formula based on SCr of 1.07 mg/dL).  Liver Function Tests: Recent Labs  Lab 07/28/22 1802  AST 23  ALT 8  ALKPHOS 80  BILITOT 0.5  PROT 6.9  ALBUMIN 3.1*    Radiology Studies: No results found.     LOS: 0 days   Jaqulyn Chancellor [employer] on [email or web address]  07/29/2022, 12:23 PM

## 2022-07-30 DIAGNOSIS — K922 Gastrointestinal hemorrhage, unspecified: Secondary | ICD-10-CM | POA: Diagnosis not present

## 2022-07-30 LAB — BASIC METABOLIC PANEL
Anion gap: 8 (ref 5–15)
BUN: 17 mg/dL (ref 8–23)
CO2: 25 mmol/L (ref 22–32)
Calcium: 8.6 mg/dL — ABNORMAL LOW (ref 8.9–10.3)
Chloride: 104 mmol/L (ref 98–111)
Creatinine, Ser: 0.98 mg/dL (ref 0.61–1.24)
GFR, Estimated: >60 mL/min (ref >60)
Glucose, Bld: 94 mg/dL (ref 70–99)
Potassium: 4.4 mmol/L (ref 3.5–5.1)
Sodium: 137 mmol/L (ref 135–145)

## 2022-07-30 LAB — CBC
HCT: 36.3 % — ABNORMAL LOW (ref 39.0–52.0)
Hemoglobin: 11.5 g/dL — ABNORMAL LOW (ref 13.0–17.0)
MCH: 32.7 pg (ref 26.0–34.0)
MCHC: 31.7 g/dL (ref 30.0–36.0)
MCV: 103.1 fL — ABNORMAL HIGH (ref 80.0–100.0)
Platelets: 156 10*3/uL (ref 150–400)
RBC: 3.52 MIL/uL — ABNORMAL LOW (ref 4.22–5.81)
RDW: 14.9 % (ref 11.5–15.5)
WBC: 6.1 10*3/uL (ref 4.0–10.5)
nRBC: 0 % (ref 0.0–0.2)

## 2022-07-30 NOTE — Discharge Summary (Signed)
Physician Discharge Summary  Johnny Ochoa. [id number] DOB: Sep 19, 1945 DOA: 07/28/2022  PCP: Irena Reichmann, DO  Admit date: 07/28/2022 Discharge date: 07/30/2022  Admitted From: Home Disposition: Home  Recommendations for Outpatient Follow-up:  Follow up with PCP in 1 week with repeat CBC/BMP Follow up in ED if symptoms worsen or new appear   Home Health: No Equipment/Devices: None  Discharge Condition: Stable CODE STATUS: Full Diet recommendation: Heart healthy  Brief/Interim Summary: 77 y.o. male with medical history significant for coronary artery disease on Plavix, hypertension, hyperlipidemia, type 2 diabetes, chronic HFrEF, status post AICD placement, GERD, diverticulosis on colonoscopy done in 2019, OSA on CPAP, presented with bright red blood per rectum noticed after wiping.  On presentation, hemoglobin was 12.3.  He was kept in observation and started on IV fluids.  Subsequently, rectal bleeding has subsided.  Hemoglobin is stable.  He feels okay to go home today.  Plavix on hold.  He will be discharged home today with close outpatient follow-up with PCP with repeat CBC next week.  Plavix to be resumed from 08/03/2022 onwards if no more bleeding.  Discharge Diagnoses:   Acute lower GI bleed, suspect diverticular bleed Patient with painless hematochezia which appears to have resolved.  Last colonoscopy from 2019 (by [facility] gastroenterology) showed only diverticulosis.  This was likely a diverticular bleed.  Hemoglobin did drop some but stable.  No more bloody bowel movements  -Hemoglobin stable today as well. -Discharge patient home today.  Outpatient follow-up with PCP with repeat CBC next week.  Coronary artery disease on Plavix Hyperlipidemia Plavix on hold. He does have coronary stents but the last stent was placed more than 2 years ago.  Stable otherwise.  Resume Plavix from 08/03/2022 onwards if no more bleeding. Continue statin  Chronic systolic heart  failure -Currently compensated.  Continue Entresto, Toprol XL, Imdur and Lasix.  Outpatient follow-up with cardiology.   Parkinson's disease Continue with Sinemet   Type 2 diabetes with hyperlipidemia resume home medications at discharge   Diabetic polyneuropathy Resume home regimen   GERD Resume home PPI   BPH Resume home Flomax monitor urine output   Chronic anxiety/depression Continue home duloxetine.   Obesity -Outpatient follow-up Discharge Instructions  Discharge Instructions     Call MD for:  difficulty breathing, headache or visual disturbances   Complete by: As directed    Call MD for:  extreme fatigue   Complete by: As directed    Call MD for:  persistant dizziness or light-headedness   Complete by: As directed    Call MD for:  persistant nausea and vomiting   Complete by: As directed    Call MD for:  severe uncontrolled pain   Complete by: As directed    Call MD for:  temperature >100.4   Complete by: As directed    Diet - low sodium heart healthy   Complete by: As directed    Discharge instructions   Complete by: As directed    Please hold your Plavix for now.  You may resume it on 08/03/2022 if you do not have any further episodes of bleeding.  Seek attention if your bleeding recurs.  You were cared for by a hospitalist during your hospital stay. If you have any questions about your discharge medications or the care you received while you were in the hospital after you are discharged, you can call the unit and asked to speak with the hospitalist on call if the hospitalist that took care of you  is not available. Once you are discharged, your primary care physician will handle any further medical issues. Please note that NO REFILLS for any discharge medications will be authorized once you are discharged, as it is imperative that you return to your primary care physician (or establish a relationship with a primary care physician if you do not have one) for your  aftercare needs so that they can reassess your need for medications and monitor your lab values. If you do not have a primary care physician, you can call [phone] for a physician referral.   Discharge patient   Complete by: As directed    Discharge disposition: 01-Home or Self Care   Discharge patient date: 07/30/2022   Increase activity slowly   Complete by: As directed       Allergies as of 07/30/2022       Reactions   Metformin Diarrhea   Metformin Hcl Other (See Comments)        Medication List     STOP taking these medications    albuterol 108 (90 Base) MCG/ACT inhaler Commonly known as: VENTOLIN HFA   indomethacin 50 MG capsule Commonly known as: INDOCIN   ondansetron 4 MG disintegrating tablet Commonly known as: ZOFRAN-ODT   ondansetron 4 MG tablet Commonly known as: Zofran       TAKE these medications    acetaminophen 500 MG tablet Commonly known as: TYLENOL Take 2 tablets (1,000 mg total) by mouth every 6 (six) hours as needed. What changed:  reasons to take this Another medication with the same name was removed. Continue taking this medication, and follow the directions you see here.   allopurinol 100 MG tablet Commonly known as: ZYLOPRIM Take 100 mg by mouth 2 (two) times daily.   ascorbic acid 500 MG tablet Commonly known as: VITAMIN C Take 500 mg by mouth 2 (two) times daily.   atorvastatin 80 MG tablet Commonly known as: LIPITOR Take 80 mg by mouth daily.   carbidopa-levodopa 25-100 MG tablet Commonly known as: SINEMET IR Take 1 tablet by mouth in the morning, at noon, and at bedtime.   clopidogrel 75 MG tablet Commonly known as: PLAVIX Take 1 tablet (75 mg total) by mouth daily. Please hold for now.  You can resume on 08/03/2022 if you do not have any further episodes of bleeding. Start taking on: August 03, 2022 What changed:  additional instructions These instructions start on August 03, 2022. If you are unsure what to do until then,  ask your doctor or other care provider.   colchicine 0.6 MG tablet TAKE 1 TABLET BY MOUTH DAILY What changed:  when to take this reasons to take this   DULoxetine 60 MG capsule Commonly known as: CYMBALTA Take 60 mg by mouth 2 (two) times daily.   empagliflozin 25 MG Tabs tablet Commonly known as: JARDIANCE Take 25 mg by mouth daily.   ezetimibe 10 MG tablet Commonly known as: ZETIA Take 10 mg by mouth daily.   furosemide 40 MG tablet Commonly known as: LASIX Take 1 tablet (40 mg total) by mouth daily. Take an extra dose if you gain 2 lbs in 24 hours.   gabapentin 400 MG capsule Commonly known as: NEURONTIN Take 400 mg by mouth 3 (three) times daily.   Iron 325 (65 Fe) MG Tabs Take 325 mg by mouth daily.   isosorbide mononitrate 60 MG 24 hr tablet Commonly known as: IMDUR Take 1 tablet (60 mg total) by mouth daily.   Magnesium  400 MG Tabs Take 400 mg by mouth daily.   methocarbamol 500 MG tablet Commonly known as: ROBAXIN Take 1 tablet (500 mg total) by mouth every 6 (six) hours as needed for muscle spasms. What changed: when to take this   metoprolol succinate 100 MG 24 hr tablet Commonly known as: TOPROL-XL Take 100 mg by mouth 2 (two) times daily.   multivitamin with minerals Tabs tablet Take 1 tablet by mouth daily.   nitroGLYCERIN 0.4 MG SL tablet Commonly known as: NITROSTAT Place 0.4 mg under the tongue every 5 (five) minutes as needed for chest pain.   omeprazole 20 MG capsule Commonly known as: PRILOSEC Take 20 mg by mouth daily.   oxybutynin 5 MG 24 hr tablet Commonly known as: DITROPAN-XL Take 5 mg by mouth 2 (two) times daily.   Ozempic (2 MG/DOSE) 8 MG/3ML Sopn Generic drug: Semaglutide (2 MG/DOSE) Inject 2 mg into the skin once a week What changed:  how much to take how to take this when to take this   polyethylene glycol 17 g packet Commonly known as: MIRALAX / GLYCOLAX Take 17 g by mouth daily as needed for mild constipation.    potassium chloride 10 MEQ tablet Commonly known as: KLOR-CON M Take 10 mEq by mouth daily as needed (as needed or directed).   primidone 50 MG tablet Commonly known as: MYSOLINE Take 20 mg by mouth 2 (two) times daily.   sacubitril-valsartan 49-51 MG Commonly known as: ENTRESTO Take 1 tablet by mouth daily.   senna-docusate 8.6-50 MG tablet Commonly known as: Senokot-S Take 1 tablet by mouth 2 (two) times daily.   tamsulosin 0.4 MG Caps capsule Commonly known as: FLOMAX Take 0.4 mg by mouth daily.   terbinafine 1 % cream Commonly known as: LAMISIL Apply 1 application topically daily as needed for itching.        Follow-up Information     Irena Reichmann, DO. Schedule an appointment as soon as possible for a visit in 1 week(s).   Specialty: Family Medicine Why: post hospitalization follow up with repeat cbc Contact information: [address] [place] [address] [phone]                Allergies  Allergen Reactions   Metformin Diarrhea   Metformin Hcl Other (See Comments)    Consultations: None   Procedures/Studies: No results found.    Subjective: Patient seen and examined at bedside.  Denies any more rectal bleeding.  Tolerating diet.  Feels okay to go home today.  No fever or vomiting reported.  Discharge Exam: Vitals:   07/29/22 2139 07/30/22 0637  BP: 136/63 (!) 110/55  Pulse: 75 69  Resp: 16 16  Temp: 99 F (37.2 C) 99.1 F (37.3 C)  SpO2: 97% 96%    General: Pt is alert, awake, not in acute distress.  On room air. Cardiovascular: rate controlled, S1/S2 + Respiratory: bilateral decreased breath sounds at bases with some scattered crackles Abdominal: Soft, obese, NT, ND, bowel sounds + Extremities: Trace lower extremity edema; no cyanosis    The results of significant diagnostics from this hospitalization (including imaging, microbiology, ancillary and laboratory) are listed below for reference.      Microbiology: No results found for this or any previous visit (from the past 240 hour(s)).   Labs: BNP (last 3 results) No results for input(s): "BNP" in the last 8760 hours. Basic Metabolic Panel: Recent Labs  Lab 07/28/22 1802 07/29/22 0634 07/30/22 0440  NA 135  135 137  K 4.6 4.4 4.4  CL 99 101 104  CO2 29 27 25   GLUCOSE 98 86 94  BUN 22 17 17   CREATININE 1.23 1.07 0.98  CALCIUM 8.5* 8.5* 8.6*  MG  --  2.3  --   PHOS  --  3.4  --    Liver Function Tests: Recent Labs  Lab 07/28/22 1802  AST 23  ALT 8  ALKPHOS 80  BILITOT 0.5  PROT 6.9  ALBUMIN 3.1*   No results for input(s): "LIPASE", "AMYLASE" in the last 168 hours. No results for input(s): "AMMONIA" in the last 168 hours. CBC: Recent Labs  Lab 07/28/22 1802 07/28/22 2140 07/29/22 0634 07/29/22 1522 07/30/22 0440  WBC 6.5 6.2 5.1 5.5 6.1  NEUTROABS 3.9  --   --   --   --   HGB 11.9* 12.3* 11.7* 12.4* 11.5*  HCT 36.4* 38.0* 35.8* 38.1* 36.3*  MCV 100.6* 100.3* 101.4* 101.1* 103.1*  PLT 185 178 162 194 156   Cardiac Enzymes: No results for input(s): "CKTOTAL", "CKMB", "CKMBINDEX", "TROPONINI" in the last 168 hours. BNP: Invalid input(s): "POCBNP" CBG: No results for input(s): "GLUCAP" in the last 168 hours. D-Dimer No results for input(s): "DDIMER" in the last 72 hours. Hgb A1c No results for input(s): "HGBA1C" in the last 72 hours. Lipid Profile No results for input(s): "CHOL", "HDL", "LDLCALC", "TRIG", "CHOLHDL", "LDLDIRECT" in the last 72 hours. Thyroid function studies No results for input(s): "TSH", "T4TOTAL", "T3FREE", "THYROIDAB" in the last 72 hours.  Invalid input(s): "FREET3" Anemia work up No results for input(s): "VITAMINB12", "FOLATE", "FERRITIN", "TIBC", "IRON", "RETICCTPCT" in the last 72 hours. Urinalysis    Component Value Date/Time   COLORURINE YELLOW 01/01/2022 1658   APPEARANCEUR CLOUDY (A) 01/01/2022 1658   LABSPEC 1.020 01/01/2022 1658   PHURINE 5.5 01/01/2022  1658   GLUCOSEU >=500 (A) 01/01/2022 1658   HGBUR NEGATIVE 01/01/2022 1658   BILIRUBINUR NEGATIVE 01/01/2022 1658   KETONESUR NEGATIVE 01/01/2022 1658   PROTEINUR NEGATIVE 01/01/2022 1658   UROBILINOGEN 1.0 06/19/2014 2158   NITRITE NEGATIVE 01/01/2022 1658   LEUKOCYTESUR SMALL (A) 01/01/2022 1658   Sepsis Labs Recent Labs  Lab 07/28/22 2140 07/29/22 0634 07/29/22 1522 07/30/22 0440  WBC 6.2 5.1 5.5 6.1   Microbiology No results found for this or any previous visit (from the past 240 hour(s)).   Time coordinating discharge: 35 minutes  SIGNED:   Glade Lloyd, MD  [facility] 07/30/2022, 9:50 AM

## 2022-11-02 ENCOUNTER — Emergency Department (HOSPITAL_BASED_OUTPATIENT_CLINIC_OR_DEPARTMENT_OTHER): Payer: No Typology Code available for payment source

## 2022-11-02 ENCOUNTER — Other Ambulatory Visit: Payer: Self-pay

## 2022-11-02 ENCOUNTER — Emergency Department (HOSPITAL_BASED_OUTPATIENT_CLINIC_OR_DEPARTMENT_OTHER)
Admission: EM | Admit: 2022-11-02 | Discharge: 2022-11-03 | Disposition: A | Discharge status: Home / Self Care | Payer: No Typology Code available for payment source | Attending: Emergency Medicine | Admitting: Emergency Medicine

## 2022-11-02 ENCOUNTER — Encounter (HOSPITAL_BASED_OUTPATIENT_CLINIC_OR_DEPARTMENT_OTHER): Payer: Self-pay | Admitting: Urology

## 2022-11-02 DIAGNOSIS — Z7982 Long term (current) use of aspirin: Secondary | ICD-10-CM | POA: Insufficient documentation

## 2022-11-02 DIAGNOSIS — S01111A Laceration without foreign body of right eyelid and periocular area, initial encounter: Secondary | ICD-10-CM | POA: Diagnosis not present

## 2022-11-02 DIAGNOSIS — I609 Nontraumatic subarachnoid hemorrhage, unspecified: Secondary | ICD-10-CM

## 2022-11-02 DIAGNOSIS — Z7984 Long term (current) use of oral hypoglycemic drugs: Secondary | ICD-10-CM | POA: Insufficient documentation

## 2022-11-02 DIAGNOSIS — I110 Hypertensive heart disease with heart failure: Secondary | ICD-10-CM | POA: Insufficient documentation

## 2022-11-02 DIAGNOSIS — I2510 Atherosclerotic heart disease of native coronary artery without angina pectoris: Secondary | ICD-10-CM | POA: Diagnosis not present

## 2022-11-02 DIAGNOSIS — W01198A Fall on same level from slipping, tripping and stumbling with subsequent striking against other object, initial encounter: Secondary | ICD-10-CM | POA: Insufficient documentation

## 2022-11-02 DIAGNOSIS — Z23 Encounter for immunization: Secondary | ICD-10-CM | POA: Diagnosis not present

## 2022-11-02 DIAGNOSIS — S066X0A Traumatic subarachnoid hemorrhage without loss of consciousness, initial encounter: Secondary | ICD-10-CM | POA: Insufficient documentation

## 2022-11-02 DIAGNOSIS — S0101XA Laceration without foreign body of scalp, initial encounter: Secondary | ICD-10-CM

## 2022-11-02 DIAGNOSIS — S0990XA Unspecified injury of head, initial encounter: Secondary | ICD-10-CM

## 2022-11-02 DIAGNOSIS — Z7902 Long term (current) use of antithrombotics/antiplatelets: Secondary | ICD-10-CM | POA: Insufficient documentation

## 2022-11-02 DIAGNOSIS — I509 Heart failure, unspecified: Secondary | ICD-10-CM | POA: Diagnosis not present

## 2022-11-02 DIAGNOSIS — E119 Type 2 diabetes mellitus without complications: Secondary | ICD-10-CM | POA: Insufficient documentation

## 2022-11-02 LAB — CBC WITH DIFFERENTIAL/PLATELET
Abs Immature Granulocytes: 0.01 10*3/uL (ref 0.00–0.07)
Basophils Absolute: 0 10*3/uL (ref 0.0–0.1)
Basophils Relative: 1 %
Eosinophils Absolute: 0.7 10*3/uL — ABNORMAL HIGH (ref 0.0–0.5)
Eosinophils Relative: 13 %
HCT: 37.6 % — ABNORMAL LOW (ref 39.0–52.0)
Hemoglobin: 12.3 g/dL — ABNORMAL LOW (ref 13.0–17.0)
Immature Granulocytes: 0 %
Lymphocytes Relative: 20 %
Lymphs Abs: 1.1 10*3/uL (ref 0.7–4.0)
MCH: 33.2 pg (ref 26.0–34.0)
MCHC: 32.7 g/dL (ref 30.0–36.0)
MCV: 101.3 fL — ABNORMAL HIGH (ref 80.0–100.0)
Monocytes Absolute: 0.5 10*3/uL (ref 0.1–1.0)
Monocytes Relative: 10 %
Neutro Abs: 3.1 10*3/uL (ref 1.7–7.7)
Neutrophils Relative %: 56 %
Platelets: 188 10*3/uL (ref 150–400)
RBC: 3.71 MIL/uL — ABNORMAL LOW (ref 4.22–5.81)
RDW: 14.6 % (ref 11.5–15.5)
WBC: 5.4 10*3/uL (ref 4.0–10.5)
nRBC: 0 % (ref 0.0–0.2)

## 2022-11-02 LAB — BASIC METABOLIC PANEL
Anion gap: 11 (ref 5–15)
BUN: 15 mg/dL (ref 8–23)
CO2: 27 mmol/L (ref 22–32)
Calcium: 8.8 mg/dL — ABNORMAL LOW (ref 8.9–10.3)
Chloride: 99 mmol/L (ref 98–111)
Creatinine, Ser: 0.97 mg/dL (ref 0.61–1.24)
GFR, Estimated: >60 mL/min (ref >60)
Glucose, Bld: 87 mg/dL (ref 70–99)
Potassium: 4.9 mmol/L (ref 3.5–5.1)
Sodium: 137 mmol/L (ref 135–145)

## 2022-11-02 MED ORDER — TETANUS-DIPHTH-ACELL PERTUSSIS 5-2.5-18.5 LF-MCG/0.5 IM SUSY
0.5000 mL | PREFILLED_SYRINGE | Freq: Once | INTRAMUSCULAR | Status: AC
  Administered 2022-11-02: 0.5 mL via INTRAMUSCULAR
  Filled 2022-11-02: qty 0.5

## 2022-11-02 MED ORDER — LIDOCAINE-EPINEPHRINE 1 %-1:100000 IJ SOLN
10.0000 mL | Freq: Once | INTRAMUSCULAR | Status: AC
  Administered 2022-11-02: 10 mL via INTRADERMAL
  Filled 2022-11-02: qty 1

## 2022-11-02 MED ORDER — IOHEXOL 350 MG/ML SOLN
75.0000 mL | Freq: Once | INTRAVENOUS | Status: AC | PRN
  Administered 2022-11-02: 75 mL via INTRAVENOUS

## 2022-11-02 NOTE — Progress Notes (Signed)
Patient is level two-charge nurse Joseph Art came to radiology with patient

## 2022-11-02 NOTE — ED Provider Notes (Signed)
[place] [place] Provider Note   CSN: [id number] Arrival date & time: 11/02/22  1937     History {Add pertinent medical, surgical, social history, OB history to HPI:1} Chief Complaint  Patient presents with   Fall   Facial Laceration    Johnny Piraino. is a 77 y.o. male.   Fall  77 year old male history of CHF, CAD, diabetes, hypertension presenting for fall.  He states he was in his basement today when he lost his balance and fell.  He hit the right frontal side of his headache.  No loss of conscious.  He has no neck pain or back pain.  No chest pain or shortness of breath.  He has no headache or vomiting.  No weakness or numbness.  He does take aspirin 81 mg as well as Plavix daily which he last took this morning.  He has small laceration to the right eyebrow.  Unsure of last tetanus.  Patient states his last stents placed in 1995.     Home Medications Prior to Admission medications   Medication Sig Start Date End Date Taking? Authorizing Provider  allopurinol (ZYLOPRIM) 100 MG tablet Take 100 mg by mouth 2 (two) times daily. 06/09/21   [provider]  ascorbic acid (VITAMIN C) 500 MG tablet Take 500 mg by mouth 2 (two) times daily. 11/30/19   [provider]  atorvastatin (LIPITOR) 80 MG tablet Take 80 mg by mouth daily.    [provider]  carbidopa-levodopa (SINEMET IR) 25-100 MG tablet Take 1 tablet by mouth in the morning, at noon, and at bedtime. 06/02/21   [provider]  clopidogrel (PLAVIX) 75 MG tablet Take 1 tablet (75 mg total) by mouth daily. Please hold for now.  You can resume on 08/03/2022 if you do not have any further episodes of bleeding. 08/03/22   Osvaldo Shipper, MD  colchicine 0.6 MG tablet TAKE 1 TABLET BY MOUTH DAILY Patient taking differently: Take 0.6 mg by mouth 2 (two) times daily as needed (as needed for gout flares). 04/27/20   Park Liter, DPM  DULoxetine (CYMBALTA)  60 MG capsule Take 60 mg by mouth 2 (two) times daily.    [provider]  empagliflozin (JARDIANCE) 25 MG TABS tablet Take 25 mg by mouth daily. 02/27/21   [provider]  ezetimibe (ZETIA) 10 MG tablet Take 10 mg by mouth daily. 02/27/21   [provider]  Ferrous Sulfate (IRON) 325 (65 Fe) MG TABS Take 325 mg by mouth daily.    [provider]  furosemide (LASIX) 40 MG tablet Take 1 tablet (40 mg total) by mouth daily. Take an extra dose if you gain 2 lbs in 24 hours. 02/28/16   Rama, Maryruth Bun, MD  gabapentin (NEURONTIN) 400 MG capsule Take 400 mg by mouth 3 (three) times daily.     [provider]  isosorbide mononitrate (IMDUR) 60 MG 24 hr tablet Take 1 tablet (60 mg total) by mouth daily. 07/22/20 07/29/22  Noralee Stain, DO  Magnesium 400 MG TABS Take 400 mg by mouth daily.    [provider]  methocarbamol (ROBAXIN) 500 MG tablet Take 1 tablet (500 mg total) by mouth every 6 (six) hours as needed for muscle spasms. Patient taking differently: Take 500 mg by mouth at bedtime. 08/01/21   Clois Dupes, PA-C  metoprolol succinate (TOPROL-XL) 100 MG 24 hr tablet Take 100 mg by mouth 2 (two) times daily.  [provider]  Multiple Vitamin (MULTIVITAMIN WITH MINERALS) TABS tablet Take 1 tablet by mouth daily.    [provider]  nitroGLYCERIN (NITROSTAT) 0.4 MG SL tablet Place 0.4 mg under the tongue every 5 (five) minutes as needed for chest pain.     [provider]  omeprazole (PRILOSEC) 20 MG capsule Take 20 mg by mouth daily.    [provider]  oxybutynin (DITROPAN-XL) 5 MG 24 hr tablet Take 5 mg by mouth 2 (two) times daily. 04/21/21   [provider]  polyethylene glycol (MIRALAX / GLYCOLAX) 17 g packet Take 17 g by mouth daily as needed for mild constipation. 07/29/22   Osvaldo Shipper, MD  potassium chloride (K-DUR,KLOR-CON) 10 MEQ tablet Take 10 mEq by mouth daily as needed (as needed or  directed).    [provider]  primidone (MYSOLINE) 50 MG tablet Take 20 mg by mouth 2 (two) times daily.    [provider]  sacubitril-valsartan (ENTRESTO) 49-51 MG Take 1 tablet by mouth daily.    [provider]  Semaglutide, 2 MG/DOSE, (OZEMPIC, 2 MG/DOSE,) 8 MG/3ML SOPN Inject 2 mg into the skin once a week Patient taking differently: Inject 2 mg into the skin once a week. 05/27/21     senna-docusate (SENOKOT-S) 8.6-50 MG tablet Take 1 tablet by mouth 2 (two) times daily. 07/29/22   Osvaldo Shipper, MD  tamsulosin (FLOMAX) 0.4 MG CAPS capsule Take 0.4 mg by mouth daily.    [provider]  terbinafine (LAMISIL) 1 % cream Apply 1 application topically daily as needed for itching.    [provider]      Allergies    Metformin and Metformin hcl    Review of Systems   Review of Systems Review of systems completed and notable as per HPI.  ROS otherwise negative.   Physical Exam Updated Vital Signs BP 139/65   Pulse 73   Temp (!) 96.3 F (35.7 C)   Resp 16   Ht 5\' 7"  (1.702 m)   Wt 99.8 kg   SpO2 100%   BMI 34.46 kg/m  Physical Exam Vitals and nursing note reviewed.  Constitutional:      General: He is not in acute distress.    Appearance: He is well-developed.  HENT:     Head: Normocephalic.     Comments: Small superficial laceration to the right eyebrow.    Nose: Nose normal.     Mouth/Throat:     Mouth: Mucous membranes are moist.     Pharynx: Oropharynx is clear.  Eyes:     Extraocular Movements: Extraocular movements intact.     Conjunctiva/sclera: Conjunctivae normal.     Pupils: Pupils are equal, round, and reactive to light.  Cardiovascular:     Rate and Rhythm: Normal rate and regular rhythm.     Heart sounds: No murmur heard. Pulmonary:     Effort: Pulmonary effort is normal. No respiratory distress.     Breath sounds: Normal breath sounds.  Abdominal:     Palpations: Abdomen is soft.     Tenderness: There is  no abdominal tenderness. There is no guarding or rebound.  Musculoskeletal:        General: No swelling.     Cervical back: Neck supple.     Right lower leg: No edema.     Left lower leg: No edema.  Skin:    General: Skin is warm and dry.     Capillary Refill: Capillary refill takes  less than 2 seconds.  Neurological:     General: No focal deficit present.     Mental Status: He is alert and oriented to person, place, and time. Mental status is at baseline.     Cranial Nerves: No cranial nerve deficit.     Sensory: No sensory deficit.     Motor: No weakness.  Psychiatric:        Mood and Affect: Mood normal.     ED Results / Procedures / Treatments   Labs (all labs ordered are listed, but only abnormal results are displayed) Labs Reviewed - No data to display  EKG None  Radiology CT Head Wo Contrast  Result Date: 11/02/2022 CLINICAL DATA:  Head trauma, GCS=15, no focal neuro findings. EXAM: CT HEAD WITHOUT CONTRAST TECHNIQUE: Contiguous axial images were obtained from the base of the skull through the vertex without intravenous contrast. RADIATION DOSE REDUCTION: This exam was performed according to the departmental dose-optimization program which includes automated exposure control, adjustment of the mA and/or kV according to patient size and/or use of iterative reconstruction technique. COMPARISON:  Head CT 09/03/2020. FINDINGS: Brain: Acute subarachnoid hemorrhage along the right sylvian fissure and right frontal operculum. Gray-white differentiation is preserved. No hydrocephalus or mass effect or midline shift. Vascular: No hyperdense vessel or unexpected calcification. Skull: No calvarial fracture or suspicious bone lesion. Skull base is unremarkable. Sinuses/Orbits: Moderate paranasal sinus disease. Orbits are unremarkable. Other: None. IMPRESSION: Acute subarachnoid hemorrhage along the right Sylvian fissure and right frontal operculum. No mass effect or midline shift. Critical  Value/emergent results were called by telephone at the time of interpretation on 11/02/2022 at 8:12 pm to provider [facility] Mindel Friscia , who verbally acknowledged these results. Electronically Signed   By: Orvan Falconer M.D.   On: 11/02/2022 20:12    Procedures .Marland KitchenLaceration Repair  Date/Time: 11/02/2022 10:56 PM  Performed by: Laurence Spates, MD Authorized by: Laurence Spates, MD   Consent:    Consent obtained:  Verbal   Consent given by:  Patient   Risks discussed:  Infection, pain, poor cosmetic result, need for additional repair, nerve damage, poor wound healing and vascular damage   Alternatives discussed:  No treatment Universal protocol:    Patient identity confirmed:  Verbally with patient Anesthesia:    Anesthesia method:  Local infiltration   Local anesthetic:  Lidocaine 1% WITH epi Laceration details:    Location:  Face   Face location:  R eyebrow   Length (cm):  1 Pre-procedure details:    Preparation:  Patient was prepped and draped in usual sterile fashion Exploration:    Hemostasis achieved with:  Direct pressure Treatment:    Area cleansed with:  Chlorhexidine and saline   Amount of cleaning:  Standard Skin repair:    Repair method:  Sutures   Suture size:  5-0   Suture material:  Prolene   Suture technique:  Simple interrupted   Number of sutures:  2 Approximation:    Approximation:  Close Repair type:    Repair type:  Simple Post-procedure details:    Dressing:  Sterile dressing   Procedure completion:  Tolerated   {Document cardiac monitor, telemetry assessment procedure when appropriate:1}  Medications Ordered in ED Medications - No data to display  ED Course/ Medical Decision Making/ A&P Clinical Course as of 11/02/22 2251  Mon Nov 02, 2022  2011 Rads: [facility], recommend CTA NSU: if CTA normal, 6-8 hour repeat CTH, off ASA/Plavix 3-4 days, Dr. Lovell Sheehan [JD]  Clinical Course User Index [JD] Laurence Spates, MD   {   Click here for ABCD2,  HEART and other calculatorsREFRESH Note before signing :1}                              Medical Decision Making Amount and/or Complexity of Data Reviewed Labs: ordered. Radiology: ordered.  Risk Prescription drug management.   Medical Decision Making:   Johnny Ogando. is a 77 y.o. male who presented to the ED today with mechanical fall.  Vital signs reviewed.  On exam he is well-appearing, mechanical fall, no syncope or presyncope.  He has a laceration, tetanus updated.  Laceration.  As above with 2 sutures, hemostasis achieved.  A CT scan is concerning for subarachnoid hemorrhage.  Discussed with radiology recommend CTA to evaluate for possible aneurysm.  He has no other signs of trauma on exam.  Discussed with Dr. Lovell Sheehan with neurosurgery who recommends CTA.  If CT is normal he recommends repeat CT head 6 to 8 hours after original scan.  If this is stable he feels patient can discharge home, stop aspirin Plavix for about 3 days and then follow-up as an outpatient.  This would make a repeat CT head approximately 0200.  CTA is pending.   {crccomplexity:27900} Reviewed and confirmed nursing documentation for past medical history, family history, social history.  Reassessment and Plan:   ***    Patient's presentation is most consistent with {EM COPA:27473}     {Document critical care time when appropriate:1} {Document review of labs and clinical decision tools ie heart score, Chads2Vasc2 etc:1}  {Document your independent review of radiology images, and any outside records:1} {Document your discussion with family members, caretakers, and with consultants:1} {Document social determinants of health affecting pt's care:1} {Document your decision making why or why not admission, treatments were needed:1} Final Clinical Impression(s) / ED Diagnoses Final diagnoses:  None    Rx / DC Orders ED Discharge Orders     None

## 2022-11-02 NOTE — ED Triage Notes (Signed)
Pt states fall today  Small lac to right eye brow noted  Bleeding continued, gauze dressing applied  Pt on plavix, Denies LOC at time of fall

## 2022-11-03 ENCOUNTER — Emergency Department (HOSPITAL_BASED_OUTPATIENT_CLINIC_OR_DEPARTMENT_OTHER): Payer: No Typology Code available for payment source

## 2022-11-03 NOTE — Discharge Instructions (Signed)
Please hold your Aspirin and Plavix (clopidogrel) until you can discuss restarting with Dr. Rudolpho Sevin.

## 2022-11-03 NOTE — ED Provider Notes (Signed)
Care of the patient assumed at shift change. Here after a mechanical fall, initial head CT with SAH. Awaiting CTA, ED obs and then repeat CT if negative. Patient has been resting comfortably. I personally viewed the images from radiology studies and agree with radiologist interpretation: CTA is neg for aneurysm and repeat CT after 6 hr observation is unchanged. Patient remains awake and alert and wants to go home. Recommend he hold ASA/Plavix until he can follow up with his cardiologist in [facility] to recommend restarting plan. Suture removal in about a week. RTED sooner for any signs of worsening head injury   Pollyann Savoy, MD 11/03/22 [phone]

## 2022-11-07 ENCOUNTER — Emergency Department (HOSPITAL_BASED_OUTPATIENT_CLINIC_OR_DEPARTMENT_OTHER)
Admission: EM | Admit: 2022-11-07 | Discharge: 2022-11-08 | Disposition: A | Discharge status: Home / Self Care | Payer: Medicare Other | Attending: Emergency Medicine | Admitting: Emergency Medicine

## 2022-11-07 ENCOUNTER — Emergency Department (HOSPITAL_BASED_OUTPATIENT_CLINIC_OR_DEPARTMENT_OTHER): Payer: Medicare Other

## 2022-11-07 ENCOUNTER — Encounter (HOSPITAL_BASED_OUTPATIENT_CLINIC_OR_DEPARTMENT_OTHER): Payer: Self-pay | Admitting: Emergency Medicine

## 2022-11-07 ENCOUNTER — Other Ambulatory Visit: Payer: Self-pay

## 2022-11-07 DIAGNOSIS — Z7902 Long term (current) use of antithrombotics/antiplatelets: Secondary | ICD-10-CM | POA: Diagnosis not present

## 2022-11-07 DIAGNOSIS — I509 Heart failure, unspecified: Secondary | ICD-10-CM | POA: Insufficient documentation

## 2022-11-07 DIAGNOSIS — S066XAA Traumatic subarachnoid hemorrhage with loss of consciousness status unknown, initial encounter: Secondary | ICD-10-CM | POA: Diagnosis not present

## 2022-11-07 DIAGNOSIS — I609 Nontraumatic subarachnoid hemorrhage, unspecified: Secondary | ICD-10-CM | POA: Diagnosis not present

## 2022-11-07 DIAGNOSIS — Z7982 Long term (current) use of aspirin: Secondary | ICD-10-CM | POA: Diagnosis not present

## 2022-11-07 DIAGNOSIS — I2510 Atherosclerotic heart disease of native coronary artery without angina pectoris: Secondary | ICD-10-CM | POA: Insufficient documentation

## 2022-11-07 DIAGNOSIS — I110 Hypertensive heart disease with heart failure: Secondary | ICD-10-CM | POA: Insufficient documentation

## 2022-11-07 DIAGNOSIS — E119 Type 2 diabetes mellitus without complications: Secondary | ICD-10-CM | POA: Diagnosis not present

## 2022-11-07 DIAGNOSIS — Z79899 Other long term (current) drug therapy: Secondary | ICD-10-CM | POA: Diagnosis not present

## 2022-11-07 DIAGNOSIS — R519 Headache, unspecified: Secondary | ICD-10-CM | POA: Diagnosis present

## 2022-11-07 NOTE — ED Triage Notes (Signed)
Patient here from home with headache that started earlier this morning, states that it was in the middle of the night.  No nausea or vomiting.  Patient took tylenol with no relief in the pain.  He states that he had fallen a few days ago, got stitches.  He was taken off his Plavix on Wednesday, last took it on Tuesday.

## 2022-11-07 NOTE — ED Provider Notes (Signed)
[place] [facility] Provider Note   CSN: [id number] Arrival date & time: 11/07/22  1856     History  Chief Complaint  Patient presents with   Headache    Suleman Wools. is a 77 y.o. male.  Patient is a 77 year old male who presents with a headache.  He has a history of diabetes, hypertension, CHF, coronary artery disease.  He is on Plavix and aspirin.  He was seen here on September 16 after he had a mechanical fall.  He was found to have subarachnoid hemorrhage on CT scanning in the ED.  Neurosurgery was consulted.  It remained stable after 6 to 8-hour observation and repeat CT scan.  He was discharged home and advised to stop his Plavix and aspirin.  He has not yet restarted it.  He woke up about 3:00 in the morning with a severe pain to his right head.  He has had ongoing headache since that time.  He did take some Tylenol around 5:00 this afternoon.  He denies need for any ongoing pain medication.  He has no change in his gait.  He does walk with a cane.  He has a slight tremor which is unchanged.  No new numbness or weakness in his extremities.  No nausea or vomiting.       Home Medications Prior to Admission medications   Medication Sig Start Date End Date Taking? Authorizing Provider  allopurinol (ZYLOPRIM) 100 MG tablet Take 100 mg by mouth 2 (two) times daily. 06/09/21   [provider]  ascorbic acid (VITAMIN C) 500 MG tablet Take 500 mg by mouth 2 (two) times daily. 11/30/19   [provider]  atorvastatin (LIPITOR) 80 MG tablet Take 80 mg by mouth daily.    [provider]  carbidopa-levodopa (SINEMET IR) 25-100 MG tablet Take 1 tablet by mouth in the morning, at noon, and at bedtime. 06/02/21   [provider]  clopidogrel (PLAVIX) 75 MG tablet Take 1 tablet (75 mg total) by mouth daily. Please hold for now.  You can resume on 08/03/2022 if you do not have any further episodes of bleeding. 08/03/22    Osvaldo Shipper, MD  colchicine 0.6 MG tablet TAKE 1 TABLET BY MOUTH DAILY Patient taking differently: Take 0.6 mg by mouth 2 (two) times daily as needed (as needed for gout flares). 04/27/20   Park Liter, DPM  DULoxetine (CYMBALTA) 60 MG capsule Take 60 mg by mouth 2 (two) times daily.    [provider]  empagliflozin (JARDIANCE) 25 MG TABS tablet Take 25 mg by mouth daily. 02/27/21   [provider]  ezetimibe (ZETIA) 10 MG tablet Take 10 mg by mouth daily. 02/27/21   [provider]  Ferrous Sulfate (IRON) 325 (65 Fe) MG TABS Take 325 mg by mouth daily.    [provider]  furosemide (LASIX) 40 MG tablet Take 1 tablet (40 mg total) by mouth daily. Take an extra dose if you gain 2 lbs in 24 hours. 02/28/16   Rama, Maryruth Bun, MD  gabapentin (NEURONTIN) 400 MG capsule Take 400 mg by mouth 3 (three) times daily.     [provider]  isosorbide mononitrate (IMDUR) 60 MG 24 hr tablet Take 1 tablet (60 mg total) by mouth daily. 07/22/20 07/29/22  Noralee Stain, DO  Magnesium 400 MG TABS Take 400 mg by mouth daily.    [provider]  methocarbamol (ROBAXIN) 500 MG tablet Take 1  tablet (500 mg total) by mouth every 6 (six) hours as needed for muscle spasms. Patient taking differently: Take 500 mg by mouth at bedtime. 08/01/21   Clois Dupes, PA-C  metoprolol succinate (TOPROL-XL) 100 MG 24 hr tablet Take 100 mg by mouth 2 (two) times daily.    [provider]  Multiple Vitamin (MULTIVITAMIN WITH MINERALS) TABS tablet Take 1 tablet by mouth daily.    [provider]  nitroGLYCERIN (NITROSTAT) 0.4 MG SL tablet Place 0.4 mg under the tongue every 5 (five) minutes as needed for chest pain.     [provider]  omeprazole (PRILOSEC) 20 MG capsule Take 20 mg by mouth daily.    [provider]  oxybutynin (DITROPAN-XL) 5 MG 24 hr tablet Take 5 mg by mouth 2 (two) times daily. 04/21/21   [provider]   polyethylene glycol (MIRALAX / GLYCOLAX) 17 g packet Take 17 g by mouth daily as needed for mild constipation. 07/29/22   Osvaldo Shipper, MD  potassium chloride (K-DUR,KLOR-CON) 10 MEQ tablet Take 10 mEq by mouth daily as needed (as needed or directed).    [provider]  primidone (MYSOLINE) 50 MG tablet Take 20 mg by mouth 2 (two) times daily.    [provider]  sacubitril-valsartan (ENTRESTO) 49-51 MG Take 1 tablet by mouth daily.    [provider]  Semaglutide, 2 MG/DOSE, (OZEMPIC, 2 MG/DOSE,) 8 MG/3ML SOPN Inject 2 mg into the skin once a week Patient taking differently: Inject 2 mg into the skin once a week. 05/27/21     senna-docusate (SENOKOT-S) 8.6-50 MG tablet Take 1 tablet by mouth 2 (two) times daily. 07/29/22   Osvaldo Shipper, MD  tamsulosin (FLOMAX) 0.4 MG CAPS capsule Take 0.4 mg by mouth daily.    [provider]  terbinafine (LAMISIL) 1 % cream Apply 1 application topically daily as needed for itching.    [provider]      Allergies    Metformin and Metformin hcl    Review of Systems   Review of Systems  Constitutional:  Negative for chills, diaphoresis, fatigue and fever.  HENT:  Negative for congestion, rhinorrhea and sneezing.   Eyes: Negative.   Respiratory:  Negative for cough, chest tightness and shortness of breath.   Cardiovascular:  Positive for leg swelling (At baseline). Negative for chest pain.  Gastrointestinal:  Negative for abdominal pain, blood in stool, diarrhea, nausea and vomiting.  Genitourinary:  Negative for difficulty urinating, flank pain, frequency and hematuria.  Musculoskeletal:  Positive for arthralgias (Right upper back pain since the fall) and back pain.  Skin:  Negative for rash.  Neurological:  Positive for headaches. Negative for dizziness, speech difficulty, weakness and numbness.    Physical Exam Updated Vital Signs BP 124/62   Pulse 73   Temp 98.6 F (37 C) (Oral)   Resp 16    SpO2 98%  Physical Exam Constitutional:      Appearance: He is well-developed.  HENT:     Head: Normocephalic.     Comments: Healing laceration with sutures intact over the right eyebrow Eyes:     Pupils: Pupils are equal, round, and reactive to light.  Neck:     Comments: No pain along the spine, there is some tenderness to the right upper back and the musculature area Cardiovascular:     Rate and Rhythm: Normal rate and regular rhythm.     Heart sounds: Normal heart sounds.  Pulmonary:  Effort: Pulmonary effort is normal. No respiratory distress.     Breath sounds: Normal breath sounds. No wheezing or rales.  Chest:     Chest wall: No tenderness.  Abdominal:     General: Bowel sounds are normal.     Palpations: Abdomen is soft.     Tenderness: There is no abdominal tenderness. There is no guarding or rebound.  Musculoskeletal:        General: Swelling present. Normal range of motion.     Cervical back: Normal range of motion and neck supple.     Comments: 2-3+ pitting edema to lower extremities bilaterally  Lymphadenopathy:     Cervical: No cervical adenopathy.  Skin:    General: Skin is warm and dry.     Findings: No rash.     Comments: Small abrasion to his right lower leg.  No drainage or signs of infection  Neurological:     Mental Status: He is alert and oriented to person, place, and time.     ED Results / Procedures / Treatments   Labs (all labs ordered are listed, but only abnormal results are displayed) Labs Reviewed - No data to display  EKG None  Radiology CT Head Wo Contrast  Result Date: 11/07/2022 CLINICAL DATA:  Headache. EXAM: CT HEAD WITHOUT CONTRAST TECHNIQUE: Contiguous axial images were obtained from the base of the skull through the vertex without intravenous contrast. RADIATION DOSE REDUCTION: This exam was performed according to the departmental dose-optimization program which includes automated exposure control, adjustment of the mA and/or  kV according to patient size and/or use of iterative reconstruction technique. COMPARISON:  Head CT 11/03/2022 FINDINGS: Brain: There is a small amount of subarachnoid hemorrhage in the right sylvian fissure which appears acute/hyperdense. Other areas of previously identified right-sided subarachnoid hemorrhage have resolved in the interval. There is no mass effect or midline shift. There is mild diffuse atrophy. No acute infarct or extra-axial fluid collection identified. No hydrocephalus. Vascular: Atherosclerotic calcifications are present within the cavernous internal carotid arteries. Skull: No acute fracture. Sinuses/Orbits: There is mucosal thickening of paranasal sinuses. There is an air-fluid level in the left maxillary sinus. This is new from prior. The orbits are within normal limits. Other: None. IMPRESSION: 1. Small amount of acute subarachnoid hemorrhage in the right Sylvian fissure. 2. New air-fluid level in the left maxillary sinus. Correlate clinically for acute sinusitis. Electronically Signed   By: Darliss Cheney M.D.   On: 11/07/2022 20:54    Procedures Procedures    Medications Ordered in ED Medications - No data to display  ED Course/ Medical Decision Making/ A&P                                 Medical Decision Making Amount and/or Complexity of Data Reviewed Radiology: ordered.   Patient is a 77 year old male who presents with a headache since the early morning hours.  He recently had a subarachnoid hemorrhage related to a mechanical fall on September 16.  He has not been on his Plavix or aspirin since that time.  He has an ongoing headache throughout the day.  No vomiting or other symptoms.  No neurologic deficits.  Head CT today shows evidence of what appears to be a small new subarachnoid hemorrhage.  I discussed these findings with Emilee Hero with neurosurgery.  Given that patient had a CTA on the last visit that did not show any evidence of  aneurysm, she feels that  patient can be observed in the ED for 6 to 8 hours with repeat CT imaging at that point and if unchanged, can be discharged home.  She does not have a concern that the bleed appears to be acute from a few days ago.  She feels that this may have just been some ongoing bleeding from the prior incident.  She does recommend the patient continues to hold his Plavix and aspirin for 2 weeks from the original bleed on September 16.  Patient care turned over to Dr. Preston Fleeting pending evaluation after repeat head CT.  Can be discharged if stable.  Final Clinical Impression(s) / ED Diagnoses Final diagnoses:  SAH (subarachnoid hemorrhage) (HCC)    Rx / DC Orders ED Discharge Orders     None         Rolan Bucco, MD 11/07/22 2330

## 2022-11-07 NOTE — ED Provider Notes (Signed)
Care assumed from Dr. Fredderick Phenix, patient with subarachnoid hemorrhage pending repeat CT scan - can be discharged if stable.  Repeat CT scan shows no significant change.  Have independently viewed the images, and agree with radiologist's interpretation.  He is requesting medication for his headache, I have ordered a dose of oxycodone-acetaminophen and I am discharging him with a prescription for a small number of same.  He will need to follow-up with neurosurgery.  Results for orders placed or performed during the hospital encounter of 11/02/22  Basic metabolic panel  Result Value Ref Range   Sodium 137 135 - 145 mmol/L   Potassium 4.9 3.5 - 5.1 mmol/L   Chloride 99 98 - 111 mmol/L   CO2 27 22 - 32 mmol/L   Glucose, Bld 87 70 - 99 mg/dL   BUN 15 8 - 23 mg/dL   Creatinine, Ser 1.61 0.61 - 1.24 mg/dL   Calcium 8.8 (L) 8.9 - 10.3 mg/dL   GFR, Estimated >09 >60 mL/min   Anion gap 11 5 - 15  CBC with Differential  Result Value Ref Range   WBC 5.4 4.0 - 10.5 K/uL   RBC 3.71 (L) 4.22 - 5.81 MIL/uL   Hemoglobin 12.3 (L) 13.0 - 17.0 g/dL   HCT 45.4 (L) 09.8 - 11.9 %   MCV 101.3 (H) 80.0 - 100.0 fL   MCH 33.2 26.0 - 34.0 pg   MCHC 32.7 30.0 - 36.0 g/dL   RDW 14.7 82.9 - 56.2 %   Platelets 188 150 - 400 K/uL   nRBC 0.0 0.0 - 0.2 %   Neutrophils Relative % 56 %   Neutro Abs 3.1 1.7 - 7.7 K/uL   Lymphocytes Relative 20 %   Lymphs Abs 1.1 0.7 - 4.0 K/uL   Monocytes Relative 10 %   Monocytes Absolute 0.5 0.1 - 1.0 K/uL   Eosinophils Relative 13 %   Eosinophils Absolute 0.7 (H) 0.0 - 0.5 K/uL   Basophils Relative 1 %   Basophils Absolute 0.0 0.0 - 0.1 K/uL   Immature Granulocytes 0 %   Abs Immature Granulocytes 0.01 0.00 - 0.07 K/uL   CT Head Wo Contrast  Result Date: 11/08/2022 CLINICAL DATA:  Acute severe headache, follow-up EXAM: CT HEAD WITHOUT CONTRAST TECHNIQUE: Contiguous axial images were obtained from the base of the skull through the vertex without intravenous contrast. RADIATION  DOSE REDUCTION: This exam was performed according to the departmental dose-optimization program which includes automated exposure control, adjustment of the mA and/or kV according to patient size and/or use of iterative reconstruction technique. COMPARISON:  11/07/2022 FINDINGS: Brain: Unchanged small amount of subarachnoid hemorrhage in the right sylvian fissure. No new intracranial hemorrhage. No mass effect or midline shift. No acute infarct, mass, or hydrocephalus. Vascular: No hyperdense vessel. Atherosclerotic calcifications in the intracranial carotid and vertebral arteries. Skull: Negative for fracture or focal lesion. Sinuses/Orbits: Mucosal thickening throughout the paranasal sinuses. Status post bilateral lens replacements. Other: The mastoid air cells are well aerated. IMPRESSION: Unchanged small amount of subarachnoid hemorrhage in the right Sylvian fissure. No new or increased intracranial hemorrhage. Electronically Signed   By: Wiliam Ke M.D.   On: 11/08/2022 02:15   CT Head Wo Contrast  Result Date: 11/07/2022 CLINICAL DATA:  Headache. EXAM: CT HEAD WITHOUT CONTRAST TECHNIQUE: Contiguous axial images were obtained from the base of the skull through the vertex without intravenous contrast. RADIATION DOSE REDUCTION: This exam was performed according to the departmental dose-optimization program which includes automated exposure control, adjustment  of the mA and/or kV according to patient size and/or use of iterative reconstruction technique. COMPARISON:  Head CT 11/03/2022 FINDINGS: Brain: There is a small amount of subarachnoid hemorrhage in the right sylvian fissure which appears acute/hyperdense. Other areas of previously identified right-sided subarachnoid hemorrhage have resolved in the interval. There is no mass effect or midline shift. There is mild diffuse atrophy. No acute infarct or extra-axial fluid collection identified. No hydrocephalus. Vascular: Atherosclerotic calcifications are  present within the cavernous internal carotid arteries. Skull: No acute fracture. Sinuses/Orbits: There is mucosal thickening of paranasal sinuses. There is an air-fluid level in the left maxillary sinus. This is new from prior. The orbits are within normal limits. Other: None. IMPRESSION: 1. Small amount of acute subarachnoid hemorrhage in the right Sylvian fissure. 2. New air-fluid level in the left maxillary sinus. Correlate clinically for acute sinusitis. Electronically Signed   By: Darliss Cheney M.D.   On: 11/07/2022 20:54   CT Head Wo Contrast  Result Date: 11/03/2022 CLINICAL DATA:  Head trauma, subarachnoid hemorrhage, follow-up EXAM: CT HEAD WITHOUT CONTRAST TECHNIQUE: Contiguous axial images were obtained from the base of the skull through the vertex without intravenous contrast. RADIATION DOSE REDUCTION: This exam was performed according to the departmental dose-optimization program which includes automated exposure control, adjustment of the mA and/or kV according to patient size and/or use of iterative reconstruction technique. COMPARISON:  11/02/2022 CT head 8:01 p.m. FINDINGS: Brain: Redemonstrated subarachnoid hemorrhage along the right sylvian fissure and right frontal operculum. No definite new hemorrhage. No significant mass effect or midline shift. No acute infarct, mass, or hydrocephalus. Gray-white differentiation is preserved. The basilar cisterns are patent. Vascular: No hyperdense vessel. Atherosclerotic calcifications in the intracranial carotid and vertebral arteries. Skull: Negative for fracture or focal lesion. Sinuses/Orbits: Mucosal thickening throughout the paranasal sinuses. No acute finding in the orbits. Status post bilateral lens replacements. IMPRESSION: Redemonstrated subarachnoid hemorrhage along the right Sylvian fissure and right frontal operculum. No definite new hemorrhage. No significant mass effect or midline shift. Electronically Signed   By: Wiliam Ke M.D.   On:  11/03/2022 03:06   CT Angio Head Neck W WO CM  Result Date: 11/03/2022 CLINICAL DATA:  Head trauma, subarachnoid hemorrhage EXAM: CT ANGIOGRAPHY HEAD AND NECK WITH AND WITHOUT CONTRAST TECHNIQUE: Multidetector CT imaging of the head and neck was performed using the standard protocol during bolus administration of intravenous contrast. Multiplanar CT image reconstructions and MIPs were obtained to evaluate the vascular anatomy. Carotid stenosis measurements (when applicable) are obtained utilizing NASCET criteria, using the distal internal carotid diameter as the denominator. RADIATION DOSE REDUCTION: This exam was performed according to the departmental dose-optimization program which includes automated exposure control, adjustment of the mA and/or kV according to patient size and/or use of iterative reconstruction technique. CONTRAST:  75mL OMNIPAQUE IOHEXOL 350 MG/ML SOLN COMPARISON:  None Available. FINDINGS: CT HEAD FINDINGS For noncontrast findings, please see same day CT head. CTA NECK FINDINGS Aortic arch: Standard branching. Imaged portion shows no evidence of aneurysm or dissection. No significant stenosis of the major arch vessel origins. Aortic atherosclerosis Right carotid system: No evidence of dissection, occlusion, or hemodynamically significant stenosis (greater than 50%). Atherosclerotic disease at the bifurcation and in the proximal ICA is not hemodynamically significant. Left carotid system: No evidence of dissection, occlusion, or hemodynamically significant stenosis (greater than 50%). Atherosclerotic disease at the bifurcation and in the proximal ICA is not hemodynamically significant. Vertebral arteries: Poor visualization of the proximal left V1 segment secondary to  contrast in the adjacent veins. The left vertebral artery is somewhat irregular but patent to the skull base without focal stenosis. Severe stenosis at the origin of the right vertebral artery, which is otherwise patent to the  skull base. Skeleton: Please see same day CT head and cervical spine. Other neck: Negative. Upper chest: No focal pulmonary opacity or pleural effusion. Review of the MIP images confirms the above findings CTA HEAD FINDINGS Anterior circulation: Both internal carotid arteries are patent to the termini, with mild stenosis in the bilateral supraclinoid segments. A1 segments patent. Normal anterior communicating artery. Anterior cerebral arteries are patent to their distal aspects without significant stenosis. No M1 stenosis or occlusion. MCA branches perfused to their distal aspects without significant stenosis. Posterior circulation: Vertebral arteries patent to the vertebrobasilar junction without significant stenosis. Posterior inferior cerebellar arteries patent proximally. Basilar patent to its distal aspect without significant stenosis. Superior cerebellar arteries patent proximally. Patent P1 segments. PCAs perfused to their distal aspects without significant stenosis. The bilateral posterior communicating arteries are patent. Venous sinuses: As permitted by contrast timing, patent. Anatomic variants: None significant. Review of the MIP images confirms the above findings IMPRESSION: 1. No intracranial large vessel occlusion. Mild stenosis in the bilateral supraclinoid ICA segments. 2. Severe stenosis at the origin of the right vertebral artery, which is otherwise patent to the skull base. 3. No other hemodynamically significant stenosis in the neck. 4. Aortic atherosclerosis. Aortic Atherosclerosis (ICD10-I70.0). Electronically Signed   By: Wiliam Ke M.D.   On: 11/03/2022 01:22   CT C-SPINE NO CHARGE  Result Date: 11/02/2022 CLINICAL DATA:  Trauma EXAM: CT CERVICAL SPINE WITHOUT CONTRAST TECHNIQUE: Multidetector CT imaging of the cervical spine was performed without intravenous contrast. Multiplanar CT image reconstructions were also generated. RADIATION DOSE REDUCTION: This exam was performed according  to the departmental dose-optimization program which includes automated exposure control, adjustment of the mA and/or kV according to patient size and/or use of iterative reconstruction technique. COMPARISON:  None Available. FINDINGS: Alignment: Normal cervical lordosis. Skull base and vertebrae: No acute fracture. No primary bone lesion or focal pathologic process. Fragmentation of the dens, suggesting inflammatory arthropathy. Soft tissues and spinal canal: No prevertebral fluid or swelling. No visible canal hematoma. Disc levels: Mild degenerative changes, most prominent C6-7. Spinal canal is patent. Upper chest: Visualized lung apices are clear. Other: Visualized thyroid is unremarkable. IMPRESSION: No traumatic injury to the cervical spine. Mild degenerative changes. Electronically Signed   By: Charline Bills M.D.   On: 11/02/2022 23:25   CT Head Wo Contrast  Result Date: 11/02/2022 CLINICAL DATA:  Head trauma, GCS=15, no focal neuro findings. EXAM: CT HEAD WITHOUT CONTRAST TECHNIQUE: Contiguous axial images were obtained from the base of the skull through the vertex without intravenous contrast. RADIATION DOSE REDUCTION: This exam was performed according to the departmental dose-optimization program which includes automated exposure control, adjustment of the mA and/or kV according to patient size and/or use of iterative reconstruction technique. COMPARISON:  Head CT 09/03/2020. FINDINGS: Brain: Acute subarachnoid hemorrhage along the right sylvian fissure and right frontal operculum. Gray-white differentiation is preserved. No hydrocephalus or mass effect or midline shift. Vascular: No hyperdense vessel or unexpected calcification. Skull: No calvarial fracture or suspicious bone lesion. Skull base is unremarkable. Sinuses/Orbits: Moderate paranasal sinus disease. Orbits are unremarkable. Other: None. IMPRESSION: Acute subarachnoid hemorrhage along the right Sylvian fissure and right frontal operculum.  No mass effect or midline shift. Critical Value/emergent results were called by telephone at the time of interpretation  on 11/02/2022 at 8:12 pm to provider [facility] DAVIS , who verbally acknowledged these results. Electronically Signed   By: Orvan Falconer M.D.   On: 11/02/2022 20:12      Dione Booze, MD 11/08/22 [phone]

## 2022-11-07 NOTE — Discharge Instructions (Signed)
Do not take your Plavix and aspirin until September 30 which is two weeks after your fall.  Return to the emergency room if you have any worsening symptoms.

## 2022-11-07 NOTE — ED Notes (Signed)
Pt in bathroom when called for triage.

## 2022-11-08 ENCOUNTER — Emergency Department (HOSPITAL_BASED_OUTPATIENT_CLINIC_OR_DEPARTMENT_OTHER): Payer: Medicare Other

## 2022-11-08 DIAGNOSIS — S066XAA Traumatic subarachnoid hemorrhage with loss of consciousness status unknown, initial encounter: Secondary | ICD-10-CM | POA: Diagnosis not present

## 2022-11-08 MED ORDER — OXYCODONE-ACETAMINOPHEN 5-325 MG PO TABS
1.0000 | ORAL_TABLET | Freq: Once | ORAL | Status: AC
  Administered 2022-11-08: 1 via ORAL
  Filled 2022-11-08: qty 1

## 2022-11-08 MED ORDER — OXYCODONE-ACETAMINOPHEN 5-325 MG PO TABS
1.0000 | ORAL_TABLET | ORAL | 0 refills | Status: DC | PRN
Start: 2022-11-08 — End: 2023-12-16

## 2022-11-20 ENCOUNTER — Other Ambulatory Visit: Payer: Self-pay | Admitting: Student

## 2022-11-20 DIAGNOSIS — I609 Nontraumatic subarachnoid hemorrhage, unspecified: Secondary | ICD-10-CM

## 2022-11-25 ENCOUNTER — Encounter (HOSPITAL_BASED_OUTPATIENT_CLINIC_OR_DEPARTMENT_OTHER): Payer: Self-pay | Admitting: Emergency Medicine

## 2022-11-25 ENCOUNTER — Emergency Department (HOSPITAL_BASED_OUTPATIENT_CLINIC_OR_DEPARTMENT_OTHER)
Admission: EM | Admit: 2022-11-25 | Discharge: 2022-11-25 | Disposition: A | Discharge status: Home / Self Care | Payer: Medicare Other | Attending: Emergency Medicine | Admitting: Emergency Medicine

## 2022-11-25 ENCOUNTER — Other Ambulatory Visit: Payer: Self-pay

## 2022-11-25 DIAGNOSIS — Z1152 Encounter for screening for COVID-19: Secondary | ICD-10-CM | POA: Insufficient documentation

## 2022-11-25 DIAGNOSIS — J029 Acute pharyngitis, unspecified: Secondary | ICD-10-CM | POA: Insufficient documentation

## 2022-11-25 DIAGNOSIS — Z7902 Long term (current) use of antithrombotics/antiplatelets: Secondary | ICD-10-CM | POA: Insufficient documentation

## 2022-11-25 DIAGNOSIS — R197 Diarrhea, unspecified: Secondary | ICD-10-CM | POA: Diagnosis not present

## 2022-11-25 LAB — GROUP A STREP BY PCR: Group A Strep by PCR: NOT DETECTED

## 2022-11-25 LAB — SARS CORONAVIRUS 2 BY RT PCR: SARS Coronavirus 2 by RT PCR: NEGATIVE

## 2022-11-25 MED ORDER — DEXAMETHASONE 4 MG PO TABS
10.0000 mg | ORAL_TABLET | Freq: Once | ORAL | Status: AC
  Administered 2022-11-25: 10 mg via ORAL
  Filled 2022-11-25: qty 3

## 2022-11-25 MED ORDER — NYSTATIN 100000 UNIT/ML MT SUSP: 500000.0000 [IU] | Freq: Four times a day (QID) | OROMUCOSAL | 0 refills | Status: DC

## 2022-11-25 NOTE — ED Triage Notes (Signed)
Pt c/o sore throat since last night with head ache that started today. Denies fevers.

## 2022-11-25 NOTE — ED Provider Notes (Signed)
[facility] Provider Note   CSN: [id number] Arrival date & time: 11/25/22  1926     History  Chief Complaint  Patient presents with   Sore Throat    Johnny Ochoa. is a 77 y.o. male.  77 yo M with chief complaints of sore throat cough going on for about 24 hours.  Patient unfortunately had suffered a fall last week and had hit his head and had a subarachnoid hemorrhage.  He is deemed to be stable to go home and has had headaches off and on.  He does feel like he has a little bit of a headache now.  No fevers.  No known sick contacts.  Has had a little bit of diarrhea.  Has had no issues controlling his blood sugar.   Sore Throat       Home Medications Prior to Admission medications   Medication Sig Start Date End Date Taking? Authorizing Provider  nystatin (MYCOSTATIN) 100000 UNIT/ML suspension Take 5 mLs (500,000 Units total) by mouth 4 (four) times daily. 11/25/22  Yes Melene Plan, DO  allopurinol (ZYLOPRIM) 100 MG tablet Take 100 mg by mouth 2 (two) times daily. 06/09/21   [provider]  ascorbic acid (VITAMIN C) 500 MG tablet Take 500 mg by mouth 2 (two) times daily. 11/30/19   [provider]  atorvastatin (LIPITOR) 80 MG tablet Take 80 mg by mouth daily.    [provider]  carbidopa-levodopa (SINEMET IR) 25-100 MG tablet Take 1 tablet by mouth in the morning, at noon, and at bedtime. 06/02/21   [provider]  clopidogrel (PLAVIX) 75 MG tablet Take 1 tablet (75 mg total) by mouth daily. Please hold for now.  You can resume on 08/03/2022 if you do not have any further episodes of bleeding. 08/03/22   Osvaldo Shipper, MD  colchicine 0.6 MG tablet TAKE 1 TABLET BY MOUTH DAILY Patient taking differently: Take 0.6 mg by mouth 2 (two) times daily as needed (as needed for gout flares). 04/27/20   Park Liter, DPM  DULoxetine (CYMBALTA) 60 MG capsule Take 60 mg by mouth 2 (two) times daily.     [provider]  empagliflozin (JARDIANCE) 25 MG TABS tablet Take 25 mg by mouth daily. 02/27/21   [provider]  ezetimibe (ZETIA) 10 MG tablet Take 10 mg by mouth daily. 02/27/21   [provider]  Ferrous Sulfate (IRON) 325 (65 Fe) MG TABS Take 325 mg by mouth daily.    [provider]  furosemide (LASIX) 40 MG tablet Take 1 tablet (40 mg total) by mouth daily. Take an extra dose if you gain 2 lbs in 24 hours. 02/28/16   Rama, Maryruth Bun, MD  gabapentin (NEURONTIN) 400 MG capsule Take 400 mg by mouth 3 (three) times daily.     [provider]  isosorbide mononitrate (IMDUR) 60 MG 24 hr tablet Take 1 tablet (60 mg total) by mouth daily. 07/22/20 07/29/22  Noralee Stain, DO  Magnesium 400 MG TABS Take 400 mg by mouth daily.    [provider]  methocarbamol (ROBAXIN) 500 MG tablet Take 1 tablet (500 mg total) by mouth every 6 (six) hours as needed for muscle spasms. Patient taking differently: Take 500 mg by mouth at bedtime. 08/01/21   Clois Dupes, PA-C  metoprolol succinate (TOPROL-XL) 100 MG 24 hr tablet Take 100 mg by mouth 2 (two) times daily.    [provider]  Multiple Vitamin (MULTIVITAMIN WITH MINERALS) TABS tablet Take 1 tablet by mouth daily.    [provider]  nitroGLYCERIN (NITROSTAT) 0.4 MG SL tablet Place 0.4 mg under the tongue every 5 (five) minutes as needed for chest pain.     [provider]  omeprazole (PRILOSEC) 20 MG capsule Take 20 mg by mouth daily.    [provider]  oxybutynin (DITROPAN-XL) 5 MG 24 hr tablet Take 5 mg by mouth 2 (two) times daily. 04/21/21   [provider]  oxyCODONE-acetaminophen (PERCOCET) 5-325 MG tablet Take 1 tablet by mouth every 4 (four) hours as needed for moderate pain. 11/08/22   Dione Booze, MD  polyethylene glycol (MIRALAX / GLYCOLAX) 17 g packet Take 17 g by mouth daily as needed for mild constipation. 07/29/22   Osvaldo Shipper, MD  potassium  chloride (K-DUR,KLOR-CON) 10 MEQ tablet Take 10 mEq by mouth daily as needed (as needed or directed).    [provider]  primidone (MYSOLINE) 50 MG tablet Take 20 mg by mouth 2 (two) times daily.    [provider]  sacubitril-valsartan (ENTRESTO) 49-51 MG Take 1 tablet by mouth daily.    [provider]  Semaglutide, 2 MG/DOSE, (OZEMPIC, 2 MG/DOSE,) 8 MG/3ML SOPN Inject 2 mg into the skin once a week Patient taking differently: Inject 2 mg into the skin once a week. 05/27/21     senna-docusate (SENOKOT-S) 8.6-50 MG tablet Take 1 tablet by mouth 2 (two) times daily. 07/29/22   Osvaldo Shipper, MD  tamsulosin (FLOMAX) 0.4 MG CAPS capsule Take 0.4 mg by mouth daily.    [provider]  terbinafine (LAMISIL) 1 % cream Apply 1 application topically daily as needed for itching.    [provider]      Allergies    Metformin and Metformin hcl    Review of Systems   Review of Systems  Physical Exam Updated Vital Signs BP 135/74   Pulse 78   Temp 98.2 F (36.8 C)   Resp 18   Ht 5\' 7"  (1.702 m)   Wt 99.8 kg   SpO2 100%   BMI 34.46 kg/m  Physical Exam Vitals and nursing note reviewed.  Constitutional:      Appearance: He is well-developed.  HENT:     Head: Normocephalic and atraumatic.     Mouth/Throat:     Comments: Posterior oropharynx with whitish patches about the tonsils and about the uvula.  Tolerating secretions without difficulty. Eyes:     Pupils: Pupils are equal, round, and reactive to light.  Neck:     Vascular: No JVD.  Cardiovascular:     Rate and Rhythm: Normal rate and regular rhythm.     Heart sounds: No murmur heard.    No friction rub. No gallop.  Pulmonary:     Effort: No respiratory distress.     Breath sounds: No wheezing.  Abdominal:     General: There is no distension.     Tenderness: There is no abdominal tenderness. There is no guarding or rebound.  Musculoskeletal:        General: Normal range of motion.      Cervical back: Normal range of motion and neck supple.  Skin:    Coloration: Skin is not pale.     Findings: No rash.  Neurological:     Mental Status: He is alert and oriented to person, place, and time.  Psychiatric:        Behavior: Behavior normal.  ED Results / Procedures / Treatments   Labs (all labs ordered are listed, but only abnormal results are displayed) Labs Reviewed  SARS CORONAVIRUS 2 BY RT PCR  GROUP A STREP BY PCR    EKG None  Radiology No results found.  Procedures Procedures    Medications Ordered in ED Medications  dexamethasone (DECADRON) tablet 10 mg (has no administration in time range)    ED Course/ Medical Decision Making/ A&P                                 Medical Decision Making Risk Prescription drug management.   77 yo M with a chief complaint of a sore throat going on since yesterday.  On my exam it is hard to tell what the exact etiology is, he has whitish patches in the back that to me looks more like thrush than anything else though they do not seem to extend for on the tongue and seem to be localized about the tonsils but he has some satellite lesions.  He has no significant tonsillar swelling.  I will start him on nystatin.  Single dose of Decadron.  PCP follow-up.  Patient also has follow-up tomorrow for repeat head CT after his subarachnoid hemorrhage from about a week or so ago.  I discussed risk and benefits of repeat CT imaging here.  He says he would prefer to get a CT as planned tomorrow.  8:32 PM:  I have discussed the diagnosis/risks/treatment options with the patient and family.  Evaluation and diagnostic testing in the emergency department does not suggest an emergent condition requiring admission or immediate intervention beyond what has been performed at this time.  They will follow up with PCP. We also discussed returning to the ED immediately if new or worsening sx occur. We discussed the sx which are most  concerning (e.g., sudden worsening pain, fever, inability to tolerate by mouth) that necessitate immediate return. Medications administered to the patient during their visit and any new prescriptions provided to the patient are listed below.  Medications given during this visit Medications  dexamethasone (DECADRON) tablet 10 mg (has no administration in time range)     The patient appears reasonably screen and/or stabilized for discharge and I doubt any other medical condition or other [facility] requiring further screening, evaluation, or treatment in the ED at this time prior to discharge.          Final Clinical Impression(s) / ED Diagnoses Final diagnoses:  Pharyngitis, unspecified etiology    Rx / DC Orders ED Discharge Orders          Ordered    nystatin (MYCOSTATIN) 100000 UNIT/ML suspension  4 times daily        11/25/22 2028              Melene Plan, DO 11/25/22 2032

## 2022-11-25 NOTE — Discharge Instructions (Signed)
Use the antifungal medicine as prescribed.  Please follow-up with your family doctor in the office.  Please return for worsening symptoms inability swallow.

## 2022-11-26 ENCOUNTER — Ambulatory Visit
Admission: RE | Admit: 2022-11-26 | Discharge: 2022-11-26 | Disposition: A | Discharge status: Home / Self Care | Payer: Medicare Other | Source: Ambulatory Visit | Attending: Student | Admitting: Student

## 2022-11-26 DIAGNOSIS — I609 Nontraumatic subarachnoid hemorrhage, unspecified: Secondary | ICD-10-CM

## 2022-12-10 NOTE — Plan of Care (Signed)
[facility] Tonsillectomy/Adenoidectomy, Postoperative PEDS care plan entered in error.

## 2022-12-14 ENCOUNTER — Other Ambulatory Visit: Payer: Self-pay

## 2022-12-14 ENCOUNTER — Emergency Department (HOSPITAL_BASED_OUTPATIENT_CLINIC_OR_DEPARTMENT_OTHER): Payer: No Typology Code available for payment source

## 2022-12-14 ENCOUNTER — Encounter (HOSPITAL_BASED_OUTPATIENT_CLINIC_OR_DEPARTMENT_OTHER): Payer: Self-pay

## 2022-12-14 ENCOUNTER — Emergency Department (HOSPITAL_BASED_OUTPATIENT_CLINIC_OR_DEPARTMENT_OTHER)
Admission: EM | Admit: 2022-12-14 | Discharge: 2022-12-14 | Disposition: A | Discharge status: Home / Self Care | Payer: No Typology Code available for payment source | Attending: Emergency Medicine | Admitting: Emergency Medicine

## 2022-12-14 DIAGNOSIS — Y9389 Activity, other specified: Secondary | ICD-10-CM | POA: Diagnosis not present

## 2022-12-14 DIAGNOSIS — E119 Type 2 diabetes mellitus without complications: Secondary | ICD-10-CM | POA: Insufficient documentation

## 2022-12-14 DIAGNOSIS — M25562 Pain in left knee: Secondary | ICD-10-CM | POA: Diagnosis not present

## 2022-12-14 DIAGNOSIS — Z7902 Long term (current) use of antithrombotics/antiplatelets: Secondary | ICD-10-CM | POA: Insufficient documentation

## 2022-12-14 DIAGNOSIS — I2510 Atherosclerotic heart disease of native coronary artery without angina pectoris: Secondary | ICD-10-CM | POA: Diagnosis not present

## 2022-12-14 DIAGNOSIS — W1830XA Fall on same level, unspecified, initial encounter: Secondary | ICD-10-CM | POA: Diagnosis not present

## 2022-12-14 DIAGNOSIS — Z8546 Personal history of malignant neoplasm of prostate: Secondary | ICD-10-CM | POA: Diagnosis not present

## 2022-12-14 DIAGNOSIS — I110 Hypertensive heart disease with heart failure: Secondary | ICD-10-CM | POA: Insufficient documentation

## 2022-12-14 DIAGNOSIS — Y92009 Unspecified place in unspecified non-institutional (private) residence as the place of occurrence of the external cause: Secondary | ICD-10-CM

## 2022-12-14 DIAGNOSIS — Z79899 Other long term (current) drug therapy: Secondary | ICD-10-CM | POA: Diagnosis not present

## 2022-12-14 DIAGNOSIS — M79641 Pain in right hand: Secondary | ICD-10-CM | POA: Diagnosis present

## 2022-12-14 DIAGNOSIS — I509 Heart failure, unspecified: Secondary | ICD-10-CM | POA: Diagnosis not present

## 2022-12-14 DIAGNOSIS — W19XXXA Unspecified fall, initial encounter: Secondary | ICD-10-CM

## 2022-12-14 NOTE — ED Notes (Signed)
Patient transported to CT 

## 2022-12-14 NOTE — ED Notes (Signed)
Discharge paperwork reviewed entirely with patient, including follow up care. Pain was under control. No prescriptions were called in, but all questions were addressed.  Pt verbalized understanding as well as all parties involved. No questions or concerns voiced at the time of discharge. No acute distress noted.   Pt ambulated out to PVA without incident or assistance.  Pt advised they will notify their PCP immediately., Pt advised they will seek followup care with a specialist and followup with their PCP. , and Pt was given information to obtain and notify a PCP.

## 2022-12-14 NOTE — ED Triage Notes (Signed)
Pt reports he was working on his car today around [id number] when he turned around, lost his balance and fell face first onto the concrete. No LOC but he does have a bruise to bridge of nose. He takes Plavix. He endorses headache.

## 2022-12-14 NOTE — Discharge Instructions (Signed)
Thank you for coming to [facility] Emergency Department. You were seen for fall at home. We did an exam, and imaging, and these showed no bleeding in the brain or fractures. Please follow up with your primary care provider within 1 week.   Do not hesitate to return to the ED or call 911 if you experience: -Worsening symptoms -Chest pain, shortness of breath -Lightheadedness, passing out -Fevers/chills -Anything else that concerns you

## 2022-12-14 NOTE — ED Notes (Signed)
Pt is complaining of lt knee pain. Pt is placed in gown so lt knee can be assessed. [facility] paramedic informed.

## 2022-12-14 NOTE — ED Provider Notes (Signed)
[place] [facility] Provider Note   CSN: [id number] Arrival date & time: 12/14/22  1658     History {Add pertinent medical, surgical, social history, OB history to HPI:1} No chief complaint on file.   Johnny Ochoa. is a 77 y.o. male with PMH as listed below who presents with ***.  Pt reports he was working on his car today around 1600-1630 when he turned around, lost his balance and fell face first onto the concrete. No LOC but he does have a bruise to bridge of nose. He takes Plavix. He endorses headache.   Past Medical History:  Diagnosis Date   AICD (automatic cardioverter/defibrillator) present    Arthritis    Cancer (HCC)    prostate  (Radiation only)   Cellulitis    CHF (congestive heart failure) (HCC)    Complication of anesthesia    slow to come out of anesthesia   Coronary artery disease    Diabetes mellitus    Dyspnea    GERD (gastroesophageal reflux disease)    Gout    Hypertension    Kidney stones    Leg swelling    Low back pain    Myocardial infarction (HCC) 12/09/1993   Obese    OSA (obstructive sleep apnea)    Peripheral neuropathy    PTSD (post-traumatic stress disorder)        Home Medications Prior to Admission medications   Medication Sig Start Date End Date Taking? Authorizing Provider  allopurinol (ZYLOPRIM) 100 MG tablet Take 100 mg by mouth 2 (two) times daily. 06/09/21   [provider]  ascorbic acid (VITAMIN C) 500 MG tablet Take 500 mg by mouth 2 (two) times daily. 11/30/19   [provider]  atorvastatin (LIPITOR) 80 MG tablet Take 80 mg by mouth daily.    [provider]  carbidopa-levodopa (SINEMET IR) 25-100 MG tablet Take 1 tablet by mouth in the morning, at noon, and at bedtime. 06/02/21   [provider]  clopidogrel (PLAVIX) 75 MG tablet Take 1 tablet (75 mg total) by mouth daily. Please hold for now.  You can resume on 08/03/2022 if you do not have any  further episodes of bleeding. 08/03/22   Osvaldo Shipper, MD  colchicine 0.6 MG tablet TAKE 1 TABLET BY MOUTH DAILY Patient taking differently: Take 0.6 mg by mouth 2 (two) times daily as needed (as needed for gout flares). 04/27/20   Park Liter, DPM  DULoxetine (CYMBALTA) 60 MG capsule Take 60 mg by mouth 2 (two) times daily.    [provider]  empagliflozin (JARDIANCE) 25 MG TABS tablet Take 25 mg by mouth daily. 02/27/21   [provider]  ezetimibe (ZETIA) 10 MG tablet Take 10 mg by mouth daily. 02/27/21   [provider]  Ferrous Sulfate (IRON) 325 (65 Fe) MG TABS Take 325 mg by mouth daily.    [provider]  furosemide (LASIX) 40 MG tablet Take 1 tablet (40 mg total) by mouth daily. Take an extra dose if you gain 2 lbs in 24 hours. 02/28/16   Rama, Maryruth Bun, MD  gabapentin (NEURONTIN) 400 MG capsule Take 400 mg by mouth 3 (three) times daily.     [provider]  isosorbide mononitrate (IMDUR) 60 MG 24 hr tablet Take 1 tablet (60 mg total) by mouth daily. 07/22/20 07/29/22  Noralee Stain, DO  Magnesium 400 MG TABS Take 400 mg by mouth daily.    [provider]  methocarbamol (ROBAXIN) 500 MG tablet Take 1 tablet (500 mg total) by mouth every 6 (six) hours as needed for muscle spasms. Patient taking differently: Take 500 mg by mouth at bedtime. 08/01/21   Clois Dupes, PA-C  metoprolol succinate (TOPROL-XL) 100 MG 24 hr tablet Take 100 mg by mouth 2 (two) times daily.    [provider]  Multiple Vitamin (MULTIVITAMIN WITH MINERALS) TABS tablet Take 1 tablet by mouth daily.    [provider]  nitroGLYCERIN (NITROSTAT) 0.4 MG SL tablet Place 0.4 mg under the tongue every 5 (five) minutes as needed for chest pain.     [provider]  nystatin (MYCOSTATIN) 100000 UNIT/ML suspension Take 5 mLs (500,000 Units total) by mouth 4 (four) times daily. 11/25/22   Melene Plan, DO  omeprazole (PRILOSEC) 20 MG capsule Take  20 mg by mouth daily.    [provider]  oxybutynin (DITROPAN-XL) 5 MG 24 hr tablet Take 5 mg by mouth 2 (two) times daily. 04/21/21   [provider]  oxyCODONE-acetaminophen (PERCOCET) 5-325 MG tablet Take 1 tablet by mouth every 4 (four) hours as needed for moderate pain. 11/08/22   Dione Booze, MD  polyethylene glycol (MIRALAX / GLYCOLAX) 17 g packet Take 17 g by mouth daily as needed for mild constipation. 07/29/22   Osvaldo Shipper, MD  potassium chloride (K-DUR,KLOR-CON) 10 MEQ tablet Take 10 mEq by mouth daily as needed (as needed or directed).    [provider]  primidone (MYSOLINE) 50 MG tablet Take 20 mg by mouth 2 (two) times daily.    [provider]  sacubitril-valsartan (ENTRESTO) 49-51 MG Take 1 tablet by mouth daily.    [provider]  Semaglutide, 2 MG/DOSE, (OZEMPIC, 2 MG/DOSE,) 8 MG/3ML SOPN Inject 2 mg into the skin once a week Patient taking differently: Inject 2 mg into the skin once a week. 05/27/21     senna-docusate (SENOKOT-S) 8.6-50 MG tablet Take 1 tablet by mouth 2 (two) times daily. 07/29/22   Osvaldo Shipper, MD  tamsulosin (FLOMAX) 0.4 MG CAPS capsule Take 0.4 mg by mouth daily.    [provider]  terbinafine (LAMISIL) 1 % cream Apply 1 application topically daily as needed for itching.    [provider]      Allergies    Metformin and Metformin hcl    Review of Systems   Review of Systems A 10 point review of systems was performed and is negative unless otherwise reported in HPI.  Physical Exam Updated Vital Signs There were no vitals taken for this visit. Physical Exam General: Normal appearing {Desc; male/male:11659}, lying in bed.  HEENT: PERRLA, Sclera anicteric, MMM, trachea midline.  Cardiology: RRR, no murmurs/rubs/gallops. BL radial and DP pulses equal bilaterally.  Resp: Normal respiratory rate and effort. CTAB, no wheezes, rhonchi, crackles.  Abd: Soft, non-tender, non-distended.  No rebound tenderness or guarding.  GU: Deferred. MSK: No peripheral edema or signs of trauma. Extremities without deformity or TTP. No cyanosis or clubbing. Skin: warm, dry. No rashes or lesions. Back: No CVA tenderness Neuro: A&Ox4, CNs II-XII grossly intact. MAEs. Sensation grossly intact.  Psych: Normal mood and affect.   ED Results / Procedures / Treatments   Labs (all labs ordered are listed, but only abnormal results are displayed) Labs Reviewed - No data to display  EKG None  Radiology No results found.  Procedures Procedures  {Document cardiac monitor, telemetry assessment procedure when appropriate:1}  Medications Ordered in ED  Medications - No data to display  ED Course/ Medical Decision Making/ A&P                          Medical Decision Making   This patient presents to the ED for concern of ***, this involves an extensive number of treatment options, and is a complaint that carries with it a high risk of complications and morbidity.  I considered the following differential and admission for this acute, potentially life threatening condition.   MDM:    ***     Labs: I Ordered, and personally interpreted labs.  The pertinent results include:  ***  Imaging Studies ordered: I ordered imaging studies including *** I independently visualized and interpreted imaging. I agree with the radiologist interpretation  Additional history obtained from ***.  External records from outside source obtained and reviewed including ***  Cardiac Monitoring: The patient was maintained on a cardiac monitor.  I personally viewed and interpreted the cardiac monitored which showed an underlying rhythm of: ***  Reevaluation: After the interventions noted above, I reevaluated the patient and found that they have :{resolved/improved/worsened:23923::"improved"}  Social Determinants of Health: ***  Disposition:  ***  Co morbidities that complicate the patient  evaluation  Past Medical History:  Diagnosis Date   AICD (automatic cardioverter/defibrillator) present    Arthritis    Cancer (HCC)    prostate  (Radiation only)   Cellulitis    CHF (congestive heart failure) (HCC)    Complication of anesthesia    slow to come out of anesthesia   Coronary artery disease    Diabetes mellitus    Dyspnea    GERD (gastroesophageal reflux disease)    Gout    Hypertension    Kidney stones    Leg swelling    Low back pain    Myocardial infarction (HCC) 12/09/1993   Obese    OSA (obstructive sleep apnea)    Peripheral neuropathy    PTSD (post-traumatic stress disorder)      Medicines No orders of the defined types were placed in this encounter.   I have reviewed the patients home medicines and have made adjustments as needed  Problem List / ED Course: Problem List Items Addressed This Visit   None        {Document critical care time when appropriate:1} {Document review of labs and clinical decision tools ie heart score, Chads2Vasc2 etc:1}  {Document your independent review of radiology images, and any outside records:1} {Document your discussion with family members, caretakers, and with consultants:1} {Document social determinants of health affecting pt's care:1} {Document your decision making why or why not admission, treatments were needed:1}  This note was created using dictation software, which may contain spelling or grammatical errors.

## 2023-04-08 ENCOUNTER — Encounter (HOSPITAL_COMMUNITY): Payer: Self-pay | Admitting: Neurology

## 2023-04-15 ENCOUNTER — Other Ambulatory Visit (HOSPITAL_COMMUNITY): Payer: Self-pay | Admitting: Neurology

## 2023-04-15 DIAGNOSIS — R251 Tremor, unspecified: Secondary | ICD-10-CM

## 2023-04-23 ENCOUNTER — Encounter (HOSPITAL_COMMUNITY)
Admission: RE | Admit: 2023-04-23 | Discharge: 2023-04-23 | Disposition: A | Discharge status: Home / Self Care | Payer: No Typology Code available for payment source | Source: Ambulatory Visit | Attending: Neurology | Admitting: Neurology

## 2023-04-23 DIAGNOSIS — R251 Tremor, unspecified: Secondary | ICD-10-CM | POA: Diagnosis present

## 2023-04-23 MED ORDER — POTASSIUM IODIDE (ANTIDOTE) 130 MG PO TABS
ORAL_TABLET | ORAL | Status: AC
  Filled 2023-04-23: qty 1

## 2023-12-14 ENCOUNTER — Other Ambulatory Visit: Payer: Self-pay

## 2023-12-14 ENCOUNTER — Inpatient Hospital Stay (HOSPITAL_BASED_OUTPATIENT_CLINIC_OR_DEPARTMENT_OTHER)
Admission: EM | Admit: 2023-12-14 | Discharge: 2023-12-17 | DRG: 871 | Disposition: A | Discharge status: Home / Self Care | Attending: Internal Medicine | Admitting: Internal Medicine

## 2023-12-14 ENCOUNTER — Emergency Department (HOSPITAL_BASED_OUTPATIENT_CLINIC_OR_DEPARTMENT_OTHER)

## 2023-12-14 ENCOUNTER — Encounter (HOSPITAL_BASED_OUTPATIENT_CLINIC_OR_DEPARTMENT_OTHER): Payer: Self-pay

## 2023-12-14 DIAGNOSIS — Z79899 Other long term (current) drug therapy: Secondary | ICD-10-CM

## 2023-12-14 DIAGNOSIS — E669 Obesity, unspecified: Secondary | ICD-10-CM | POA: Diagnosis present

## 2023-12-14 DIAGNOSIS — I255 Ischemic cardiomyopathy: Secondary | ICD-10-CM | POA: Diagnosis present

## 2023-12-14 DIAGNOSIS — E1142 Type 2 diabetes mellitus with diabetic polyneuropathy: Secondary | ICD-10-CM | POA: Diagnosis present

## 2023-12-14 DIAGNOSIS — A419 Sepsis, unspecified organism: Secondary | ICD-10-CM | POA: Diagnosis not present

## 2023-12-14 DIAGNOSIS — K3184 Gastroparesis: Secondary | ICD-10-CM

## 2023-12-14 DIAGNOSIS — Z955 Presence of coronary angioplasty implant and graft: Secondary | ICD-10-CM

## 2023-12-14 DIAGNOSIS — I5022 Chronic systolic (congestive) heart failure: Secondary | ICD-10-CM | POA: Diagnosis present

## 2023-12-14 DIAGNOSIS — Z87442 Personal history of urinary calculi: Secondary | ICD-10-CM

## 2023-12-14 DIAGNOSIS — E860 Dehydration: Secondary | ICD-10-CM | POA: Diagnosis present

## 2023-12-14 DIAGNOSIS — G4733 Obstructive sleep apnea (adult) (pediatric): Secondary | ICD-10-CM | POA: Diagnosis present

## 2023-12-14 DIAGNOSIS — Z6836 Body mass index (BMI) 36.0-36.9, adult: Secondary | ICD-10-CM

## 2023-12-14 DIAGNOSIS — J189 Pneumonia, unspecified organism: Principal | ICD-10-CM | POA: Diagnosis present

## 2023-12-14 DIAGNOSIS — Z8249 Family history of ischemic heart disease and other diseases of the circulatory system: Secondary | ICD-10-CM

## 2023-12-14 DIAGNOSIS — N3090 Cystitis, unspecified without hematuria: Secondary | ICD-10-CM | POA: Diagnosis present

## 2023-12-14 DIAGNOSIS — N5082 Scrotal pain: Secondary | ICD-10-CM | POA: Diagnosis present

## 2023-12-14 DIAGNOSIS — I252 Old myocardial infarction: Secondary | ICD-10-CM

## 2023-12-14 DIAGNOSIS — Z923 Personal history of irradiation: Secondary | ICD-10-CM

## 2023-12-14 DIAGNOSIS — Z95810 Presence of automatic (implantable) cardiac defibrillator: Secondary | ICD-10-CM

## 2023-12-14 DIAGNOSIS — R0902 Hypoxemia: Secondary | ICD-10-CM | POA: Diagnosis present

## 2023-12-14 DIAGNOSIS — Z87891 Personal history of nicotine dependence: Secondary | ICD-10-CM

## 2023-12-14 DIAGNOSIS — R6521 Severe sepsis with septic shock: Secondary | ICD-10-CM | POA: Diagnosis present

## 2023-12-14 DIAGNOSIS — Z888 Allergy status to other drugs, medicaments and biological substances status: Secondary | ICD-10-CM

## 2023-12-14 DIAGNOSIS — Z7985 Long-term (current) use of injectable non-insulin antidiabetic drugs: Secondary | ICD-10-CM

## 2023-12-14 DIAGNOSIS — Z7902 Long term (current) use of antithrombotics/antiplatelets: Secondary | ICD-10-CM

## 2023-12-14 DIAGNOSIS — E785 Hyperlipidemia, unspecified: Secondary | ICD-10-CM | POA: Diagnosis present

## 2023-12-14 DIAGNOSIS — B9620 Unspecified Escherichia coli [E. coli] as the cause of diseases classified elsewhere: Secondary | ICD-10-CM | POA: Diagnosis present

## 2023-12-14 DIAGNOSIS — Z1152 Encounter for screening for COVID-19: Secondary | ICD-10-CM

## 2023-12-14 DIAGNOSIS — Z833 Family history of diabetes mellitus: Secondary | ICD-10-CM

## 2023-12-14 DIAGNOSIS — Z7984 Long term (current) use of oral hypoglycemic drugs: Secondary | ICD-10-CM

## 2023-12-14 DIAGNOSIS — Z96653 Presence of artificial knee joint, bilateral: Secondary | ICD-10-CM | POA: Diagnosis present

## 2023-12-14 DIAGNOSIS — I2510 Atherosclerotic heart disease of native coronary artery without angina pectoris: Secondary | ICD-10-CM | POA: Diagnosis present

## 2023-12-14 LAB — COMPREHENSIVE METABOLIC PANEL WITH GFR
ALT: 15 U/L (ref 0–44)
AST: 26 U/L (ref 15–41)
Albumin: 3.8 g/dL (ref 3.5–5.0)
Alkaline Phosphatase: 67 U/L (ref 38–126)
Anion gap: 13 (ref 5–15)
BUN: 18 mg/dL (ref 8–23)
CO2: 24 mmol/L (ref 22–32)
Calcium: 9.3 mg/dL (ref 8.9–10.3)
Chloride: 104 mmol/L (ref 98–111)
Creatinine, Ser: 1.15 mg/dL (ref 0.61–1.24)
GFR, Estimated: >60 mL/min (ref >60)
Glucose, Bld: 130 mg/dL — ABNORMAL HIGH (ref 70–99)
Potassium: 4.1 mmol/L (ref 3.5–5.1)
Sodium: 140 mmol/L (ref 135–145)
Total Bilirubin: 0.5 mg/dL (ref 0.0–1.2)
Total Protein: 7.5 g/dL (ref 6.5–8.1)

## 2023-12-14 LAB — URINALYSIS, ROUTINE W REFLEX MICROSCOPIC
Bilirubin Urine: NEGATIVE
Glucose, UA: >=500 mg/dL — AB
Ketones, ur: NEGATIVE mg/dL
Nitrite: POSITIVE — AB
Protein, ur: NEGATIVE mg/dL
Specific Gravity, Urine: 1.01 (ref 1.005–1.030)
pH: 5.5 (ref 5.0–8.0)

## 2023-12-14 LAB — RESP PANEL BY RT-PCR (RSV, FLU A&B, COVID)  RVPGX2
Influenza A by PCR: NEGATIVE
Influenza B by PCR: NEGATIVE
Resp Syncytial Virus by PCR: NEGATIVE
SARS Coronavirus 2 by RT PCR: NEGATIVE

## 2023-12-14 LAB — CBC
HCT: 40.7 % (ref 39.0–52.0)
Hemoglobin: 13.5 g/dL (ref 13.0–17.0)
MCH: 32 pg (ref 26.0–34.0)
MCHC: 33.2 g/dL (ref 30.0–36.0)
MCV: 96.4 fL (ref 80.0–100.0)
Platelets: 175 K/uL (ref 150–400)
RBC: 4.22 MIL/uL (ref 4.22–5.81)
RDW: 14.6 % (ref 11.5–15.5)
WBC: 6.2 K/uL (ref 4.0–10.5)
nRBC: 0 % (ref 0.0–0.2)

## 2023-12-14 LAB — LIPASE, BLOOD: Lipase: 28 U/L (ref 11–51)

## 2023-12-14 LAB — URINALYSIS, MICROSCOPIC (REFLEX)

## 2023-12-14 LAB — LACTIC ACID, PLASMA: Lactic Acid, Venous: 1.1 mmol/L (ref 0.5–1.9)

## 2023-12-14 MED ORDER — ONDANSETRON HCL 4 MG/2ML IJ SOLN
4.0000 mg | Freq: Once | INTRAMUSCULAR | Status: AC
  Administered 2023-12-14: 4 mg via INTRAVENOUS
  Filled 2023-12-14: qty 2

## 2023-12-14 MED ORDER — SODIUM CHLORIDE 0.9 % IV SOLN
500.0000 mg | Freq: Once | INTRAVENOUS | Status: AC
  Administered 2023-12-14: 500 mg via INTRAVENOUS
  Filled 2023-12-14: qty 5

## 2023-12-14 MED ORDER — SODIUM CHLORIDE 0.9 % IV SOLN
2.0000 g | Freq: Once | INTRAVENOUS | Status: AC
  Administered 2023-12-14: 2 g via INTRAVENOUS
  Filled 2023-12-14: qty 20

## 2023-12-14 MED ORDER — FENTANYL CITRATE (PF) 50 MCG/ML IJ SOSY
25.0000 ug | PREFILLED_SYRINGE | Freq: Once | INTRAMUSCULAR | Status: AC
  Administered 2023-12-14: 25 ug via INTRAVENOUS
  Filled 2023-12-14: qty 1

## 2023-12-14 MED ORDER — IOHEXOL 300 MG/ML  SOLN
80.0000 mL | Freq: Once | INTRAMUSCULAR | Status: AC | PRN
  Administered 2023-12-14: 80 mL via INTRAVENOUS

## 2023-12-14 MED ORDER — SODIUM CHLORIDE 0.9 % IV BOLUS
1000.0000 mL | Freq: Once | INTRAVENOUS | Status: AC
  Administered 2023-12-14: 1000 mL via INTRAVENOUS

## 2023-12-14 MED ORDER — NOREPINEPHRINE 4 MG/250ML-% IV SOLN
0.0000 ug/min | INTRAVENOUS | Status: DC
  Administered 2023-12-14: 2 ug/min via INTRAVENOUS
  Filled 2023-12-14: qty 250

## 2023-12-14 MED ORDER — SODIUM CHLORIDE 0.9 % IV SOLN: Freq: Once | INTRAVENOUS | Status: AC

## 2023-12-14 NOTE — ED Notes (Signed)
 After preforming multiple vital signs assessment, the pt confirms that all findings are in line with his baseline vitals.  His everyday BP readings are the same as found here today.  Will continue to monitor

## 2023-12-14 NOTE — ED Triage Notes (Signed)
 Generalized abd pain, NVD, body aches, chills since yesterday. Also reports inability to urinate since yesterday (performed self cath at home)   States daughter has same symptoms.

## 2023-12-14 NOTE — ED Notes (Signed)
 Patient transported to CT

## 2023-12-14 NOTE — ED Notes (Signed)
 [place] at bedside at this time

## 2023-12-14 NOTE — ED Provider Notes (Cosign Needed Addendum)
 [facility] Provider Note   CSN: [id number] Arrival date & time: 12/14/23  1805     Patient presents with: Abdominal Pain   Johnny Ochoa. is a 78 y.o. male.   Yesterday, patient became unable to spontaneously void and had to self cath x6 throughout the day.  Around 10 pm, he became able to void spontaneously again and has been voiding since. Concurrently with retention, abdominal pain began. Patient endorses diarrhea since last night x5-6 times. Patient endorses vomiting x3 today. Vomit, urine, and stool are without blood. Patient's daughter (who works in daycare) has had diarrhea and vomiting for the past 2 days. Patient endorses chills and feeling feverish, but did not check formal temp.  Patient had prostate radiation ~20 years ago and has subsequent scarring which has resulted in urinary retention. Patient notes significant penile shrinkage, difficult to cath. Patient had percutaneous cholecystostomy in 2015 for cholecystitis.  Still has his gallbladder now.   Abdominal Pain Associated symptoms: diarrhea, nausea and vomiting   Associated symptoms: no chest pain, no chills, no constipation, no cough, no dysuria, no fever, no hematuria, no shortness of breath and no sore throat       Prior to Admission medications   Medication Sig Start Date End Date Taking? Authorizing Provider  allopurinol  (ZYLOPRIM ) 100 MG tablet Take 100 mg by mouth 2 (two) times daily. 06/09/21   [provider]  ascorbic acid  (VITAMIN C) 500 MG tablet Take 500 mg by mouth 2 (two) times daily. 11/30/19   [provider]  atorvastatin  (LIPITOR) 80 MG tablet Take 80 mg by mouth daily.    [provider]  carbidopa -levodopa  (SINEMET  IR) 25-100 MG tablet Take 1 tablet by mouth in the morning, at noon, and at bedtime. 06/02/21   [provider]  clopidogrel  (PLAVIX ) 75 MG tablet Take 1 tablet (75 mg total) by mouth daily.  Please hold for now.  You can resume on 08/03/2022 if you do not have any further episodes of bleeding. 08/03/22   Krishnan, Gokul, MD  colchicine  0.6 MG tablet TAKE 1 TABLET BY MOUTH DAILY Patient taking differently: Take 0.6 mg by mouth 2 (two) times daily as needed (as needed for gout flares). 04/27/20   Price, Michael J, DPM  DULoxetine  (CYMBALTA ) 60 MG capsule Take 60 mg by mouth 2 (two) times daily.    [provider]  empagliflozin  (JARDIANCE ) 25 MG TABS tablet Take 25 mg by mouth daily. 02/27/21   [provider]  ezetimibe  (ZETIA ) 10 MG tablet Take 10 mg by mouth daily. 02/27/21   [provider]  Ferrous Sulfate (IRON) 325 (65 Fe) MG TABS Take 325 mg by mouth daily.    [provider]  furosemide  (LASIX ) 40 MG tablet Take 1 tablet (40 mg total) by mouth daily. Take an extra dose if you gain 2 lbs in 24 hours. 02/28/16   Rama, Tawni SQUIBB, MD  gabapentin  (NEURONTIN ) 400 MG capsule Take 400 mg by mouth 3 (three) times daily.     [provider]  isosorbide  mononitrate (IMDUR ) 60 MG 24 hr tablet Take 1 tablet (60 mg total) by mouth daily. 07/22/20 07/29/22  Rojelio Nest, DO  Magnesium  400 MG TABS Take 400 mg by mouth daily.    [provider]  methocarbamol  (ROBAXIN ) 500 MG tablet Take 1 tablet (500 mg total) by mouth every 6 (six) hours as needed for muscle spasms. Patient taking differently: Take 500 mg by  mouth at bedtime. 08/01/21   Leigh Valery RAMAN, PA-C  metoprolol  succinate (TOPROL -XL) 100 MG 24 hr tablet Take 100 mg by mouth 2 (two) times daily.    [provider]  Multiple Vitamin (MULTIVITAMIN WITH MINERALS) TABS tablet Take 1 tablet by mouth daily.    [provider]  nitroGLYCERIN  (NITROSTAT ) 0.4 MG SL tablet Place 0.4 mg under the tongue every 5 (five) minutes as needed for chest pain.     [provider]  nystatin  (MYCOSTATIN ) 100000 UNIT/ML suspension Take 5 mLs (500,000 Units total) by mouth 4 (four) times  daily. 11/25/22   Floyd, Dan, DO  omeprazole (PRILOSEC) 20 MG capsule Take 20 mg by mouth daily.    [provider]  oxybutynin  (DITROPAN -XL) 5 MG 24 hr tablet Take 5 mg by mouth 2 (two) times daily. 04/21/21   [provider]  oxyCODONE -acetaminophen  (PERCOCET) 5-325 MG tablet Take 1 tablet by mouth every 4 (four) hours as needed for moderate pain. 11/08/22   Raford Lenis, MD  polyethylene glycol (MIRALAX  / GLYCOLAX ) 17 g packet Take 17 g by mouth daily as needed for mild constipation. 07/29/22   Verdene Purchase, MD  potassium chloride  (K-DUR,KLOR-CON ) 10 MEQ tablet Take 10 mEq by mouth daily as needed (as needed or directed).    [provider]  primidone  (MYSOLINE ) 50 MG tablet Take 20 mg by mouth 2 (two) times daily.    [provider]  sacubitril -valsartan  (ENTRESTO ) 49-51 MG Take 1 tablet by mouth daily.    [provider]  Semaglutide , 2 MG/DOSE, (OZEMPIC , 2 MG/DOSE,) 8 MG/3ML SOPN Inject 2 mg into the skin once a week Patient taking differently: Inject 2 mg into the skin once a week. 05/27/21     senna-docusate (SENOKOT-S) 8.6-50 MG tablet Take 1 tablet by mouth 2 (two) times daily. 07/29/22   Krishnan, Gokul, MD  tamsulosin  (FLOMAX ) 0.4 MG CAPS capsule Take 0.4 mg by mouth daily.    [provider]  terbinafine (LAMISIL) 1 % cream Apply 1 application topically daily as needed for itching.    [provider]    Allergies: Metformin and Metformin hcl    Review of Systems  Constitutional:  Negative for chills and fever.  HENT:  Negative for ear pain and sore throat.   Eyes:  Negative for pain and visual disturbance.  Respiratory:  Negative for cough and shortness of breath.   Cardiovascular:  Negative for chest pain and palpitations.  Gastrointestinal:  Positive for abdominal pain, diarrhea, nausea and vomiting. Negative for abdominal distention, anal bleeding, blood in stool and constipation.  Genitourinary:  Positive for  difficulty urinating and testicular pain. Negative for dysuria, flank pain and hematuria.  Musculoskeletal:  Negative for arthralgias and back pain.  Skin:  Negative for color change and rash.  Neurological:  Negative for seizures and syncope.  All other systems reviewed and are negative.   Updated Vital Signs BP (!) 83/42   Pulse 73   Temp 98.5 F (36.9 C)   Resp 20   SpO2 99%   Physical Exam Vitals and nursing note reviewed.  Constitutional:      General: He is not in acute distress.    Appearance: He is well-developed.  HENT:     Head: Normocephalic and atraumatic.  Eyes:     Conjunctiva/sclera: Conjunctivae normal.  Cardiovascular:     Rate and Rhythm: Normal rate and regular rhythm.     Heart sounds: No murmur heard. Pulmonary:  Effort: Pulmonary effort is normal. No respiratory distress.     Breath sounds: Normal breath sounds.  Abdominal:     General: Bowel sounds are increased. There is distension.     Palpations: Abdomen is soft.     Tenderness: There is generalized abdominal tenderness. There is guarding. There is no right CVA tenderness, left CVA tenderness or rebound.  Genitourinary:    Penis: Normal.      Testes:        Right: Tenderness present.        Left: Tenderness present.  Musculoskeletal:        General: No swelling.     Cervical back: Neck supple.  Skin:    General: Skin is warm and dry.     Capillary Refill: Capillary refill takes less than 2 seconds.  Neurological:     General: No focal deficit present.     Mental Status: He is alert.  Psychiatric:        Mood and Affect: Mood normal.     (all labs ordered are listed, but only abnormal results are displayed) Labs Reviewed  COMPREHENSIVE METABOLIC PANEL WITH GFR - Abnormal; Notable for the following components:      Result Value   Glucose, Bld 130 (*)    All other components within normal limits  URINALYSIS, ROUTINE W REFLEX MICROSCOPIC - Abnormal; Notable for the following  components:   Glucose, UA >=500 (*)    Hgb urine dipstick TRACE (*)    Nitrite POSITIVE (*)    Leukocytes,Ua SMALL (*)    All other components within normal limits  URINALYSIS, MICROSCOPIC (REFLEX) - Abnormal; Notable for the following components:   Bacteria, UA MANY (*)    All other components within normal limits  RESP PANEL BY RT-PCR (RSV, FLU A&B, COVID)  [id number]  URINE CULTURE  CULTURE, BLOOD (ROUTINE X 2)  CULTURE, BLOOD (ROUTINE X 2)  LIPASE, BLOOD  CBC  LACTIC ACID, PLASMA  LACTIC ACID, PLASMA    EKG: None  Radiology: US  SCROTUM W/DOPPLER Result Date: 12/14/2023 EXAM: ULTRASOUND SCROTUM/TESTICLES WITH DOPPLER FLOW EVALUATION 12/14/2023 09:24:08 PM CLINICAL HISTORY: Scrotal pain for several days. TECHNIQUE: Duplex ultrasound using B-mode/gray scaled imaging, Doppler spectral analysis and color flow Doppler was obtained of the testicles. COMPARISON: None available. FINDINGS: RIGHT: MEASUREMENTS: The right testicle measures 2.9 x 1.7 x 1.8 cm. GREY SCALE: The right testicle demonstrates normal homogeneous echotexture without focal lesion. No testicular microlithiasis. DOPPLER EVALUATION: There is concern by the ultrasound technologist for right sided thrombus. VARICOCELE: More distally, a right varicocele. SCROTAL SAC: No hydrocele. EPIDIDYMIS: No acute abnormality. LEFT: MEASUREMENTS: The left testicle measures 3.5 x 1.5 x 2.2 cm. GREY SCALE: The left testicle demonstrates normal homogeneous echotexture without focal lesion. No testicular microlithiasis. DOPPLER EVALUATION: There is normal arterial and venous Doppler flow within the testicle. VARICOCELE: No scrotal varicocele. SCROTAL SAC: No hydrocele. EPIDIDYMIS: No acute abnormality. Recommendation: Consider right lower quadrant ultrasound for deep venous thrombosis if clinically indicated. IMPRESSION: 1. Concern for right-sided thrombus with associated right varicocele. Consider right lower extremity duplex ultrasound to evaluate  for deep venous thrombosis if clinically indicated. Electronically signed by: Morgane Naveau MD 12/14/2023 10:12 PM EDT RP Workstation: [id number]   CT ABDOMEN PELVIS W CONTRAST Result Date: 12/14/2023 EXAM: CT ABDOMEN AND PELVIS WITH CONTRAST 12/14/2023 07:43:00 PM TECHNIQUE: CT of the abdomen and pelvis was performed with the administration of 80 mL of iohexol  (OMNIPAQUE ) 300 MG/ML solution. Multiplanar reformatted images are provided for review. Automated  exposure control, iterative reconstruction, and/or weight-based adjustment of the mA/kV was utilized to reduce the radiation dose to as low as reasonably achievable. COMPARISON: 07/20/2000, 01/01/2022 CT examinations. CLINICAL HISTORY: Abdominal pain, acute, nonlocalized. Generalized abd pain, NVD, body aches, chills since yesterday. Also reports inability to urinate since yesterday (performed self cath at home). FINDINGS: LOWER CHEST: Partially visualized intracardiac pacing leads. Multivessel coronary vascular calcification. LIVER: The liver is unremarkable. GALLBLADDER AND BILE DUCTS: Focal gallbladder wall thickening on series 2 image 26. No biliary ductal dilatation. SPLEEN: No acute abnormality. PANCREAS: No acute abnormality. ADRENAL GLANDS: Thickened adrenal glands without dominant mass. KIDNEYS, URETERS AND BLADDER: Subcentimeter hypodensities in the kidneys too small to further characterize; no imaging follow up recommended. No stones in the kidneys or ureters. No hydronephrosis. No perinephric or periureteral stranding. Thick walled urinary bladder with mucosal enhancement and mild perivesical stranding. GI AND BOWEL: Stomach demonstrates no acute abnormality. Fluid-filled small bowel and colon. No acute bowel wall thickening. Diverticular disease of the left colon without acute wall thickening. There is no bowel obstruction. PERITONEUM AND RETROPERITONEUM: No ascites. No free air. VASCULATURE: Aorta is normal in caliber. LYMPH NODES: No  lymphadenopathy. REPRODUCTIVE ORGANS: No acute abnormality. BONES AND SOFT TISSUES: Small fat-containing inguinal hernias. Chronic infiltration of the subcutaneous soft tissues of the anterior abdominal wall. Small fat-containing umbilical hernia. No acute osseous abnormality. IMPRESSION: 1. Thick-walled urinary bladder with mucosal enhancement and mild perivesical stranding. Correlate for cystitis. 2. Focal gallbladder wall thickening without biliary dilatation. This could be due to adenomyomatosis or possibly a gallbladder mass but is probably grossly stable as compared with 01/18/2022. Non-emergent gallbladder ultrasound correlation is recommended. 3. Left-sided colonic diverticulosis without acute inflammatory wall thickening. 4. Fluid within the small and large bowel  possible enteritis. Electronically signed by: Luke Bun MD 12/14/2023 08:00 PM EDT RP Workstation: [id number]   DG Chest Port 1 View Result Date: 12/14/2023 CLINICAL DATA:  Cough and fever. EXAM: PORTABLE CHEST 1 VIEW COMPARISON:  03/18/2023 FINDINGS: Multi lead left-sided pacemaker in place. The heart is mildly enlarged but stable. Mediastinal contours are unchanged. Bronchial and interstitial thickening with ill-defined patchy opacity at the lung bases. No pleural effusion or pneumothorax. No acute osseous findings. IMPRESSION: Bronchial and interstitial thickening with ill-defined patchy opacity at the lung bases, suspicious for pneumonia. Electronically Signed   By: Andrea Gasman M.D.   On: 12/14/2023 19:33     Procedures   Medications Ordered in the ED  0.9 %  sodium chloride  infusion (has no administration in time range)  norepinephrine (LEVOPHED) 4mg  in (0.016 mg/mL) premix infusion (has no administration in time range)  sodium chloride  0.9 % bolus 1,000 mL (0 mLs Intravenous Stopped 12/14/23 2036)  fentaNYL  (SUBLIMAZE ) injection 25 mcg (25 mcg Intravenous Given 12/14/23 1854)  cefTRIAXone  (ROCEPHIN ) 2 g in sodium  chloride 0.9 % 100 mL IVPB (0 g Intravenous Stopped 12/14/23 2106)  azithromycin  (ZITHROMAX ) 500 mg in sodium chloride  0.9 % 250 mL IVPB (0 mg Intravenous Stopped 12/14/23 2200)  iohexol  (OMNIPAQUE ) 300 MG/ML solution 80 mL (80 mLs Intravenous Contrast Given 12/14/23 1944)  sodium chloride  0.9 % bolus 1,000 mL (0 mLs Intravenous Stopped 12/14/23 2221)  fentaNYL  (SUBLIMAZE ) injection 25 mcg (25 mcg Intravenous Given 12/14/23 2208)  ondansetron  (ZOFRAN ) injection 4 mg (4 mg Intravenous Given 12/14/23 2208)  sodium chloride  0.9 % bolus 1,000 mL (1,000 mLs Intravenous New Bag/Given 12/14/23 2224)  Medical Decision Making Tahir Blank is a 78 yo male with PMH of prostate cancer s/p radiation and resection now with retention requiring intermittent self-cath, cirrhosis, and history of lower GIB presenting with acute abdominal pain, vomiting, and diarrhea.  Patient presented with borderline hypotension, significant abdominal pain.  On exam, patient had generalized abdominal pain and scrotal pain so CT AP and scrotal US  were obtained.  CTAP showed possible enteritis, concern for cystitis.  Scrotal ultrasound was unremarkable.  CXR showed opacities at the lung bases L>R, similar to prior but somewhat concerning for PNA.  UA with small leuks and positive nitrite.  Administered ceftriaxone  and azithromycin  x 1 for possible CAP, acute cystitis.  Treated pain with Fentanyl  and administered LR bolus 1L x1.  On reassessment, patient's hypotension persisted and he became hypoxic to 88% on RA.  Another 1L bolus was administered.  Not clear sepsis given lack of leukocytosis, lactic acidosis, and fever, but BP persistently hypotensive despite IVF.  Initially consulted [facility] hospitalist for admission for hypertension, hypoxemia, and CAP.  However, given persistent hypotension with drop to 80/40, started levothyroxine and consulted CCM instead.  Amount and/or Complexity of Data  Reviewed Labs: ordered. Decision-making details documented in ED Course. Radiology: ordered and independent interpretation performed. Decision-making details documented in ED Course.  Risk Prescription drug management. Decision regarding hospitalization.      Final diagnoses:  Community acquired pneumonia, unspecified laterality    ED Discharge Orders     None        Hilda Rynders, MD 12/14/23 2320    Patt Alm Macho, MD 12/15/23 1459

## 2023-12-14 NOTE — ED Notes (Signed)
 Lab notified of new lactic acid order.

## 2023-12-15 ENCOUNTER — Inpatient Hospital Stay (HOSPITAL_COMMUNITY)

## 2023-12-15 DIAGNOSIS — E669 Obesity, unspecified: Secondary | ICD-10-CM | POA: Diagnosis present

## 2023-12-15 DIAGNOSIS — I2510 Atherosclerotic heart disease of native coronary artery without angina pectoris: Secondary | ICD-10-CM | POA: Diagnosis present

## 2023-12-15 DIAGNOSIS — J189 Pneumonia, unspecified organism: Secondary | ICD-10-CM | POA: Diagnosis present

## 2023-12-15 DIAGNOSIS — Z7984 Long term (current) use of oral hypoglycemic drugs: Secondary | ICD-10-CM | POA: Diagnosis not present

## 2023-12-15 DIAGNOSIS — I255 Ischemic cardiomyopathy: Secondary | ICD-10-CM | POA: Diagnosis present

## 2023-12-15 DIAGNOSIS — B9620 Unspecified Escherichia coli [E. coli] as the cause of diseases classified elsewhere: Secondary | ICD-10-CM | POA: Diagnosis present

## 2023-12-15 DIAGNOSIS — Z8249 Family history of ischemic heart disease and other diseases of the circulatory system: Secondary | ICD-10-CM | POA: Diagnosis not present

## 2023-12-15 DIAGNOSIS — E1142 Type 2 diabetes mellitus with diabetic polyneuropathy: Secondary | ICD-10-CM | POA: Diagnosis present

## 2023-12-15 DIAGNOSIS — Z87891 Personal history of nicotine dependence: Secondary | ICD-10-CM | POA: Diagnosis not present

## 2023-12-15 DIAGNOSIS — Z7985 Long-term (current) use of injectable non-insulin antidiabetic drugs: Secondary | ICD-10-CM | POA: Diagnosis not present

## 2023-12-15 DIAGNOSIS — N5082 Scrotal pain: Secondary | ICD-10-CM | POA: Diagnosis present

## 2023-12-15 DIAGNOSIS — G4733 Obstructive sleep apnea (adult) (pediatric): Secondary | ICD-10-CM | POA: Diagnosis present

## 2023-12-15 DIAGNOSIS — Z1152 Encounter for screening for COVID-19: Secondary | ICD-10-CM | POA: Diagnosis not present

## 2023-12-15 DIAGNOSIS — E860 Dehydration: Secondary | ICD-10-CM | POA: Diagnosis present

## 2023-12-15 DIAGNOSIS — A419 Sepsis, unspecified organism: Secondary | ICD-10-CM | POA: Diagnosis present

## 2023-12-15 DIAGNOSIS — N3090 Cystitis, unspecified without hematuria: Secondary | ICD-10-CM

## 2023-12-15 DIAGNOSIS — Z96653 Presence of artificial knee joint, bilateral: Secondary | ICD-10-CM | POA: Diagnosis present

## 2023-12-15 DIAGNOSIS — R6521 Severe sepsis with septic shock: Secondary | ICD-10-CM | POA: Diagnosis present

## 2023-12-15 DIAGNOSIS — I5022 Chronic systolic (congestive) heart failure: Secondary | ICD-10-CM | POA: Diagnosis present

## 2023-12-15 DIAGNOSIS — E119 Type 2 diabetes mellitus without complications: Secondary | ICD-10-CM

## 2023-12-15 DIAGNOSIS — Z833 Family history of diabetes mellitus: Secondary | ICD-10-CM | POA: Diagnosis not present

## 2023-12-15 DIAGNOSIS — Z7902 Long term (current) use of antithrombotics/antiplatelets: Secondary | ICD-10-CM | POA: Diagnosis not present

## 2023-12-15 DIAGNOSIS — M7989 Other specified soft tissue disorders: Secondary | ICD-10-CM | POA: Diagnosis not present

## 2023-12-15 DIAGNOSIS — E785 Hyperlipidemia, unspecified: Secondary | ICD-10-CM | POA: Diagnosis present

## 2023-12-15 DIAGNOSIS — R0902 Hypoxemia: Secondary | ICD-10-CM | POA: Diagnosis present

## 2023-12-15 LAB — CBC
HCT: 36.7 % — ABNORMAL LOW (ref 39.0–52.0)
Hemoglobin: 11.8 g/dL — ABNORMAL LOW (ref 13.0–17.0)
MCH: 31.6 pg (ref 26.0–34.0)
MCHC: 32.2 g/dL (ref 30.0–36.0)
MCV: 98.4 fL (ref 80.0–100.0)
Platelets: 163 K/uL (ref 150–400)
RBC: 3.73 MIL/uL — ABNORMAL LOW (ref 4.22–5.81)
RDW: 14.7 % (ref 11.5–15.5)
WBC: 4.1 K/uL (ref 4.0–10.5)
nRBC: 0 % (ref 0.0–0.2)

## 2023-12-15 LAB — BASIC METABOLIC PANEL WITH GFR
Anion gap: 10 (ref 5–15)
BUN: 13 mg/dL (ref 8–23)
CO2: 21 mmol/L — ABNORMAL LOW (ref 22–32)
Calcium: 8.2 mg/dL — ABNORMAL LOW (ref 8.9–10.3)
Chloride: 109 mmol/L (ref 98–111)
Creatinine, Ser: 1 mg/dL (ref 0.61–1.24)
GFR, Estimated: >60 mL/min (ref >60)
Glucose, Bld: 80 mg/dL (ref 70–99)
Potassium: 3.6 mmol/L (ref 3.5–5.1)
Sodium: 140 mmol/L (ref 135–145)

## 2023-12-15 LAB — GLUCOSE, CAPILLARY
Glucose-Capillary: 56 mg/dL — ABNORMAL LOW (ref 70–99)
Glucose-Capillary: 119 mg/dL — ABNORMAL HIGH (ref 70–99)
Glucose-Capillary: 73 mg/dL (ref 70–99)
Glucose-Capillary: 78 mg/dL (ref 70–99)
Glucose-Capillary: 74 mg/dL (ref 70–99)
Glucose-Capillary: 90 mg/dL (ref 70–99)
Glucose-Capillary: 83 mg/dL (ref 70–99)

## 2023-12-15 LAB — HEMOGLOBIN A1C
Hgb A1c MFr Bld: 6.7 % — ABNORMAL HIGH (ref 4.8–5.6)
Mean Plasma Glucose: 145.59 mg/dL

## 2023-12-15 LAB — PHOSPHORUS: Phosphorus: 2.8 mg/dL (ref 2.5–4.6)

## 2023-12-15 LAB — MAGNESIUM: Magnesium: 1.8 mg/dL (ref 1.7–2.4)

## 2023-12-15 LAB — LACTIC ACID, PLASMA: Lactic Acid, Venous: 0.7 mmol/L (ref 0.5–1.9)

## 2023-12-15 LAB — MRSA NEXT GEN BY PCR, NASAL: MRSA by PCR Next Gen: NOT DETECTED

## 2023-12-15 MED ORDER — CEFTRIAXONE SODIUM 1 G IJ SOLR
1.0000 g | INTRAMUSCULAR | Status: DC
  Administered 2023-12-15: 1 g via INTRAVENOUS
  Filled 2023-12-15: qty 10

## 2023-12-15 MED ORDER — DOCUSATE SODIUM 100 MG PO CAPS
100.0000 mg | ORAL_CAPSULE | Freq: Two times a day (BID) | ORAL | Status: DC | PRN
Start: 2023-12-15 — End: 2023-12-17

## 2023-12-15 MED ORDER — ZINC OXIDE 12.8 % EX OINT
TOPICAL_OINTMENT | Freq: Two times a day (BID) | CUTANEOUS | Status: DC
  Filled 2023-12-15: qty 56.7

## 2023-12-15 MED ORDER — DIMETHICONE 1 % EX CREA
TOPICAL_CREAM | Freq: Two times a day (BID) | CUTANEOUS | Status: DC
  Filled 2023-12-15: qty 113

## 2023-12-15 MED ORDER — CHLORHEXIDINE GLUCONATE CLOTH 2 % EX PADS
6.0000 | MEDICATED_PAD | Freq: Every day | CUTANEOUS | Status: DC
  Administered 2023-12-15 – 2023-12-17 (×3): 6 via TOPICAL

## 2023-12-15 MED ORDER — ORAL CARE MOUTH RINSE: 15.0000 mL | OROMUCOSAL | Status: DC | PRN

## 2023-12-15 MED ORDER — NYSTATIN 100000 UNIT/GM EX CREA
TOPICAL_CREAM | Freq: Two times a day (BID) | CUTANEOUS | Status: DC
  Filled 2023-12-15: qty 30

## 2023-12-15 MED ORDER — POLYETHYLENE GLYCOL 3350 17 G PO PACK
17.0000 g | PACK | Freq: Every day | ORAL | Status: DC | PRN
Start: 2023-12-15 — End: 2023-12-17

## 2023-12-15 MED ORDER — SODIUM CHLORIDE 0.9 % IV SOLN: 500.0000 mg | INTRAVENOUS | Status: DC

## 2023-12-15 MED ORDER — INSULIN ASPART 100 UNIT/ML IJ SOLN: 0.0000 [IU] | INTRAMUSCULAR | Status: DC

## 2023-12-15 MED ORDER — DEXTROSE 50 % IV SOLN
12.5000 g | INTRAVENOUS | Status: AC
  Administered 2023-12-15: 12.5 g via INTRAVENOUS
  Filled 2023-12-15: qty 50

## 2023-12-15 MED ORDER — HEPARIN SODIUM (PORCINE) 5000 UNIT/ML IJ SOLN
5000.0000 [IU] | Freq: Three times a day (TID) | INTRAMUSCULAR | Status: DC
  Administered 2023-12-15 – 2023-12-16 (×4): 5000 [IU] via SUBCUTANEOUS
  Filled 2023-12-15 (×4): qty 1

## 2023-12-15 MED ORDER — DEXTROSE IN LACTATED RINGERS 5 % IV SOLN: INTRAVENOUS | Status: DC

## 2023-12-15 MED ORDER — NYSTATIN 100000 UNIT/GM EX CREA
TOPICAL_CREAM | Freq: Two times a day (BID) | CUTANEOUS | Status: DC
  Administered 2023-12-15: 1 via TOPICAL
  Filled 2023-12-15: qty 30

## 2023-12-15 MED ORDER — POTASSIUM CHLORIDE CRYS ER 20 MEQ PO TBCR
40.0000 meq | EXTENDED_RELEASE_TABLET | Freq: Once | ORAL | Status: AC
  Administered 2023-12-15: 40 meq via ORAL
  Filled 2023-12-15: qty 2

## 2023-12-15 MED ORDER — MAGNESIUM SULFATE 2 GM/50ML IV SOLN
2.0000 g | Freq: Once | INTRAVENOUS | Status: AC
  Administered 2023-12-15: 2 g via INTRAVENOUS
  Filled 2023-12-15: qty 50

## 2023-12-15 NOTE — ED Notes (Signed)
 Update of care link departure given to Chiropodist.

## 2023-12-15 NOTE — Progress Notes (Addendum)
 PCCM UPDATE:  Patient on 1mcg Levo this morning, pressors turned off during exam, maintaining MAPs >65. Oxygenating well on RA. Denies fever, chills, nausea, vomiting, diarrhea. Endorses mild tenderness to palpation of RUQ/RLQ. CT 10/29 with left-sided colonic diverticulosis without acute inflammatory wall thickening, gallbladder wall thickening without biliary dilatation, possible due to adenomyomatosis vs mass recommend non emergent US , fluid within small and large bowel, possible enteritis, and thickened urinary bladder w/ c/f cystitis.  -Placed order for RUQ US  for further evaluation. Plan to continue antibiotic coverage for cystitis and presumed PNA.  Physical Exam Constitutional:      General: He is not in acute distress.    Appearance: He is not toxic-appearing.  HENT:     Head: Normocephalic and atraumatic.     Mouth/Throat:     Mouth: Mucous membranes are moist.  Eyes:     General: No scleral icterus.    Extraocular Movements: Extraocular movements intact.     Pupils: Pupils are equal, round, and reactive to light.  Cardiovascular:     Rate and Rhythm: Normal rate and regular rhythm.     Heart sounds: Normal heart sounds.  Pulmonary:     Effort: Pulmonary effort is normal.     Breath sounds: Normal breath sounds.  Abdominal:     General: Abdomen is protuberant. There is no distension.     Palpations: Abdomen is soft.     Tenderness: There is abdominal tenderness in the right upper quadrant and right lower quadrant. There is no guarding or rebound.  Skin:    General: Skin is warm and dry.     Capillary Refill: Capillary refill takes less than 2 seconds.  Neurological:     General: No focal deficit present.     Mental Status: He is alert and oriented to person, place, and time.    Patient medically stable to transfer out of ICU today, pending bed availability.  CC time: 33 minutes  Betsie Peckman, DNP, AGACNP-BC [facility]  Please see [email or web address]  for pager details.  From 7A-7P if no response, please call [phone]. After hours, please call ELink [phone].

## 2023-12-15 NOTE — Progress Notes (Signed)
 Transferred to [facility] [address] per W/C in stable condition with phone, glassed, dentures and charger. Patient stated he will notify his wife of transfer. Bedside report given to charge RN

## 2023-12-15 NOTE — H&P (Signed)
 NAME:  Johnny Ochoa., MRN:  [id number], DOB:  09-08-1945, LOS: 0 ADMISSION DATE:  12/14/2023, CONSULTATION DATE:  12/11/2023 REFERRING MD:  Claudetta Eriksson, MD, CHIEF COMPLAINT:  weakness   History of Present Illness:  78 y/o male with PMH for Autoimmune Hemolytic Anemia, SAH, DMT2, HLD, Depression/Anxiety, Prostate cancer s/p radiation now with retention and patient self cath's, Ischemic Cardiomyopathy, OSA, HFrEF s/p defibrillator who presented to [facility] with generalized abd pain, NVD and body aches along with chills which started on Monday.  Daughter at home also with similar (she works in audiological scientist).   Patient endorses diarrhea since last night x5-6 times. Patient endorses vomiting x3 today. Vomit, urine, and stool are without blood. Patient's daughter (who works in daycare) has had diarrhea and vomiting for the past 2 days. Patient endorses chills and feeling feverish, but did not check formal temp. Patient on low dose Levophed, normal WBC, normal Lactic acid, cxr showing PNA and CT abd showing cystitis. Pertinent  Medical History  Prostate cancer s/p radiation now with retention and patient self cath's, HFrEF s/p defibrillator   Significant Hospital Events: Including procedures, antibiotic start and stop dates in addition to other pertinent events   10/29: Transferred from [facility] to ICU  Interim History / Subjective:  N/a  Objective    Blood pressure (!) 106/54, pulse 69, temperature 98.7 F (37.1 C), temperature source Oral, resp. rate (!) 21, weight 96.4 kg, SpO2 (!) 89%.        Intake/Output Summary (Last 24 hours) at 12/15/2023 0138 Last data filed at 12/14/2023 2352 Gross per 24 hour  Intake 3352.56 ml  Output 800 ml  Net 2552.56 ml   Filed Weights   12/15/23 0048  Weight: 96.4 kg    Examination: General: alert and awake NAD HENT: PERRLA no icterus EOMI Lungs: CTA no wheezes or rales Cardiovascular: reg s1s2 no murmurs rubs or gallops Abdomen:  soft nt nd bs pos Extremities: no cyanosis, clubbing Neuro: CN II to XII grossly intact GU: n/a  Resolved problem list   Assessment and Plan  Sepsis Patient on 2 mcg Levophed N/V/D Hypotension From sepsis/PNA and dehydration Pneumonia Broad spectrum antibiotics Cystitis Patient self caths Braod spectrum antibiotics Ischemic Cardiomyopathy s/p dual chamber AICD stable DMT2 FS and SSI   Labs   CBC: Recent Labs  Lab 12/14/23 1813  WBC 6.2  HGB 13.5  HCT 40.7  MCV 96.4  PLT 175    Basic Metabolic Panel: Recent Labs  Lab 12/14/23 1813  NA 140  K 4.1  CL 104  CO2 24  GLUCOSE 130*  BUN 18  CREATININE 1.15  CALCIUM  9.3   GFR: CrCl cannot be calculated (Unknown ideal weight.). Recent Labs  Lab 12/14/23 1813 12/14/23 1841 12/14/23 2140  WBC 6.2  --   --   LATICACIDVEN  --  1.1 0.7    Liver Function Tests: Recent Labs  Lab 12/14/23 1813  AST 26  ALT 15  ALKPHOS 67  BILITOT 0.5  PROT 7.5  ALBUMIN  3.8   Recent Labs  Lab 12/14/23 1813  LIPASE 28   No results for input(s): AMMONIA in the last 168 hours.  ABG No results found for: PHART, PCO2ART, PO2ART, HCO3, TCO2, ACIDBASEDEF, O2SAT   Coagulation Profile: No results for input(s): INR, PROTIME in the last 168 hours.  Cardiac Enzymes: No results for input(s): CKTOTAL, CKMB, CKMBINDEX, TROPONINI in the last 168 hours.  HbA1C: Hgb A1c MFr Bld  Date/Time Value Ref Range Status  07/21/2021 10:11 AM 6.6 (H) 4.8 - 5.6 % Final    Comment:    (NOTE) Pre diabetes:          5.7%-6.4%  Diabetes:              >6.4%  Glycemic control for   <7.0% adults with diabetes   07/20/2020 02:22 PM 6.3 (H) 4.8 - 5.6 % Final    Comment:    (NOTE)         Prediabetes: 5.7 - 6.4         Diabetes: >6.4         Glycemic control for adults with diabetes: <7.0     CBG: Recent Labs  Lab 12/15/23 0111  GLUCAP 56*    Review of Systems:   Currently no N/V  Past Medical  History:  He,  has a past medical history of AICD (automatic cardioverter/defibrillator) present, Arthritis, Cancer (HCC), Cellulitis, CHF (congestive heart failure) (HCC), Complication of anesthesia, Coronary artery disease, Diabetes mellitus, Dyspnea, GERD (gastroesophageal reflux disease), Gout, Hypertension, Kidney stones, Leg swelling, Low back pain, Myocardial infarction (HCC) (12/09/1993), Obese, OSA (obstructive sleep apnea), Peripheral neuropathy, and PTSD (post-traumatic stress disorder).   Surgical History:   Past Surgical History:  Procedure Laterality Date   CARDIAC DEFIBRILLATOR PLACEMENT     COLONOSCOPY WITH PROPOFOL  N/A 10/26/2017   Procedure: COLONOSCOPY WITH PROPOFOL ;  Surgeon: Lennard Lesta FALCON, MD;  Location: [facility];  Service: Endoscopy;  Laterality: N/A;   CORONARY ANGIOPLASTY WITH STENT PLACEMENT  1995    x 3 stents   CORONARY PRESSURE/FFR STUDY N/A 07/22/2020   Procedure: INTRAVASCULAR PRESSURE WIRE/FFR STUDY;  Surgeon: Mady Bruckner, MD;  Location: [facility] INVASIVE CV LAB;  Service: Cardiovascular;  Laterality: N/A;  CFX and RCA    JOINT REPLACEMENT  2004   right total knee replacement   KNEE ARTHROPLASTY Left 07/31/2021   Procedure: COMPUTER ASSISTED TOTAL KNEE ARTHROPLASTY;  Surgeon: Fidel Rogue, MD;  Location: [facility] ORS;  Service: Orthopedics;  Laterality: Left;  150   LEFT HEART CATH AND CORONARY ANGIOGRAPHY N/A 07/22/2020   Procedure: LEFT HEART CATH AND CORONARY ANGIOGRAPHY;  Surgeon: Mady Bruckner, MD;  Location: [facility] INVASIVE CV LAB;  Service: Cardiovascular;  Laterality: N/A;   MULTIPLE TOOTH EXTRACTIONS     Total Tooth extraction   PACEMAKER INSERTION     PACEMAKER PLACEMENT       Social History:   reports that he quit smoking about 43 years ago. His smoking use included cigarettes. He started smoking about 63 years ago. He has a 20 pack-year smoking history. He has never used smokeless tobacco. He reports that he does not drink alcohol  and does not use  drugs.   Family History:  His family history includes Arthritis in an other family member; Diabetes in an other family member; Heart attack in his mother; Heart disease in his brother, sister, and another family member; Hypertension in his mother and another family member.   Allergies Allergies  Allergen Reactions   Metformin Diarrhea   Metformin Hcl Other (See Comments)     Home Medications  Prior to Admission medications   Medication Sig Start Date End Date Taking? Authorizing Provider  allopurinol  (ZYLOPRIM ) 100 MG tablet Take 100 mg by mouth 2 (two) times daily. 06/09/21   [provider]  ascorbic acid  (VITAMIN C) 500 MG tablet Take 500 mg by mouth 2 (two) times daily. 11/30/19   [provider]  atorvastatin  (LIPITOR) 80 MG tablet Take 80 mg by  mouth daily.    [provider]  carbidopa -levodopa  (SINEMET  IR) 25-100 MG tablet Take 1 tablet by mouth in the morning, at noon, and at bedtime. 06/02/21   [provider]  clopidogrel  (PLAVIX ) 75 MG tablet Take 1 tablet (75 mg total) by mouth daily. Please hold for now.  You can resume on 08/03/2022 if you do not have any further episodes of bleeding. 08/03/22   Krishnan, Gokul, MD  colchicine  0.6 MG tablet TAKE 1 TABLET BY MOUTH DAILY Patient taking differently: Take 0.6 mg by mouth 2 (two) times daily as needed (as needed for gout flares). 04/27/20   Price, Michael J, DPM  DULoxetine  (CYMBALTA ) 60 MG capsule Take 60 mg by mouth 2 (two) times daily.    [provider]  empagliflozin  (JARDIANCE ) 25 MG TABS tablet Take 25 mg by mouth daily. 02/27/21   [provider]  ezetimibe  (ZETIA ) 10 MG tablet Take 10 mg by mouth daily. 02/27/21   [provider]  Ferrous Sulfate (IRON) 325 (65 Fe) MG TABS Take 325 mg by mouth daily.    [provider]  furosemide  (LASIX ) 40 MG tablet Take 1 tablet (40 mg total) by mouth daily. Take an extra dose if you gain 2 lbs in 24 hours. 02/28/16    Rama, Tawni SQUIBB, MD  gabapentin  (NEURONTIN ) 400 MG capsule Take 400 mg by mouth 3 (three) times daily.     [provider]  isosorbide  mononitrate (IMDUR ) 60 MG 24 hr tablet Take 1 tablet (60 mg total) by mouth daily. 07/22/20 07/29/22  Rojelio Nest, DO  Magnesium  400 MG TABS Take 400 mg by mouth daily.    [provider]  methocarbamol  (ROBAXIN ) 500 MG tablet Take 1 tablet (500 mg total) by mouth every 6 (six) hours as needed for muscle spasms. Patient taking differently: Take 500 mg by mouth at bedtime. 08/01/21   Leigh Valery RAMAN, PA-C  metoprolol  succinate (TOPROL -XL) 100 MG 24 hr tablet Take 100 mg by mouth 2 (two) times daily.    [provider]  Multiple Vitamin (MULTIVITAMIN WITH MINERALS) TABS tablet Take 1 tablet by mouth daily.    [provider]  nitroGLYCERIN  (NITROSTAT ) 0.4 MG SL tablet Place 0.4 mg under the tongue every 5 (five) minutes as needed for chest pain.     [provider]  nystatin  (MYCOSTATIN ) 100000 UNIT/ML suspension Take 5 mLs (500,000 Units total) by mouth 4 (four) times daily. 11/25/22   Floyd, Dan, DO  omeprazole (PRILOSEC) 20 MG capsule Take 20 mg by mouth daily.    [provider]  oxybutynin  (DITROPAN -XL) 5 MG 24 hr tablet Take 5 mg by mouth 2 (two) times daily. 04/21/21   [provider]  oxyCODONE -acetaminophen  (PERCOCET) 5-325 MG tablet Take 1 tablet by mouth every 4 (four) hours as needed for moderate pain. 11/08/22   Raford Lenis, MD  polyethylene glycol (MIRALAX  / GLYCOLAX ) 17 g packet Take 17 g by mouth daily as needed for mild constipation. 07/29/22   Krishnan, Gokul, MD  potassium chloride  (K-DUR,KLOR-CON ) 10 MEQ tablet Take 10 mEq by mouth daily as needed (as needed or directed).    [provider]  primidone  (MYSOLINE ) 50 MG tablet Take 20 mg by mouth 2 (two) times daily.    [provider]  sacubitril -valsartan  (ENTRESTO ) 49-51 MG Take 1 tablet by mouth daily.    [provider]  Semaglutide , 2 MG/DOSE, (OZEMPIC , 2 MG/DOSE,) 8 MG/3ML SOPN Inject 2 mg into the skin once  a week Patient taking differently: Inject 2 mg into the skin once a week. 05/27/21     senna-docusate (SENOKOT-S) 8.6-50 MG tablet Take 1 tablet by mouth 2 (two) times daily. 07/29/22   Krishnan, Gokul, MD  tamsulosin  (FLOMAX ) 0.4 MG CAPS capsule Take 0.4 mg by mouth daily.    [provider]  terbinafine (LAMISIL) 1 % cream Apply 1 application topically daily as needed for itching.    [provider]     Critical care time: 23   The patient is critically ill with multiple organ system failure and requires high complexity decision making for assessment and support, frequent evaluation and titration of therapies, advanced monitoring, review of radiographic studies and interpretation of complex data.   Critical Care Time devoted to patient care services, exclusive of separately billable procedures, described in this note is 32 minutes.   Orlin Fairly, MD [facility] See Amion for pager  If no response to pager , please call [phone] until 7pm After 7:00 pm call Elink  [phone] 12/15/2023, 1:39 AM

## 2023-12-15 NOTE — Plan of Care (Signed)

## 2023-12-15 NOTE — ED Notes (Signed)
 [facility] is at bedside at this time for transport to [facility]

## 2023-12-15 NOTE — Progress Notes (Signed)
 Pharmacy Electrolyte Replacement  Recent Labs:  Recent Labs    12/15/23 0418  K 3.6  MG 1.8  PHOS 2.8  CREATININE 1.00    Low Critical Values (K </= 2.5, Phos </= 1, Mg </= 1) Present: None  MD Contacted: N/A, per protocol   Plan:  Give KCl 40 mEq PO x 1 dose.  Give Mag sulfate 2g IV x 1 dose.  Reevaluate replacement needs with tomorrows labs.

## 2023-12-15 NOTE — Progress Notes (Signed)
 [facility] Physician-Brief Progress Note Patient Name: Johnny Ochoa. DOB: 23-Nov-1945 MRN: [id number]   Date of Service  12/15/2023  HPI/Events of Note  Patient admitted with sepsis and hypotension in the context of possible pneumonia + / - UTI, work up in progress, he is requiring low dose Levophed gtt after volume resuscitation, hence ICU admission.  eICU Interventions  New Patient Evaluation.        Lovely Kerins U Kalen Ratajczak 12/15/2023, 1:23 AM

## 2023-12-15 NOTE — TOC CM/SW Note (Signed)
 Transition of Care [facility]) - Inpatient Brief Assessment   Patient Details  Name: Johnny Ochoa. MRN: [id number] Date of Birth: November 29, 1945  Transition of [facility]) CM/SW Contact:    Tom-Johnson, Harvest Muskrat, RN Phone Number: 12/15/2023, 12:42 PM   Clinical Narrative:  Patient presented to the ED with Abdominal Pain, N/V and unable to urinate spontaneously, patient self cath at home.  CT Abdomen/Pelvis showed Lt-sided Colonic Diverticulosis, Fluid within Small and Large Bowel, possible Enteritis, and Cystitis.  Patient has hx of Autoimmune Hemolytic Anemia, SAH, DMT2, HLD, Depression/Anxiety, Prostate Cancer s/p Radiation, Ischemic Cardiomyopathy, OSA, HFrEF s/p Defibrillator. On IV abx, Electrolyte replacement.  CM spoke with patient and wife, Nellie at bedside about needs for post hospital transition. Patient lives with his wife, has two supportive living children. Modified independent, has a cane, walker, rollator, motorized scooter and shower seat at home.  PCP is  Patient is a [employer] Veteran, CM called in notification to the 72 hr hotline [phone]) and notification # [phone].   PCP is Gerome Brunet, DO and also Irvin, Stephen at the [place]. Patient uses the [facility] and also [facility] in [place].   No ICM needs or recommendations noted at this time.  Patient not Medically ready for discharge.  CM will continue to follow as patient progresses with care towards discharge.              Transition of Care Asessment: Insurance and Status: Insurance coverage has been reviewed Patient has primary care physician: Yes Home environment has been reviewed: Yes Prior level of function:: Modified Independent Prior/Current Home Services: No current home services Social Drivers of Health Review: SDOH reviewed no interventions necessary Readmission risk has been reviewed: Yes Transition of care needs: no transition of care needs at this time

## 2023-12-16 ENCOUNTER — Inpatient Hospital Stay (HOSPITAL_COMMUNITY)

## 2023-12-16 DIAGNOSIS — A419 Sepsis, unspecified organism: Secondary | ICD-10-CM

## 2023-12-16 DIAGNOSIS — M7989 Other specified soft tissue disorders: Secondary | ICD-10-CM

## 2023-12-16 DIAGNOSIS — R6521 Severe sepsis with septic shock: Secondary | ICD-10-CM | POA: Diagnosis not present

## 2023-12-16 LAB — GLUCOSE, CAPILLARY
Glucose-Capillary: 101 mg/dL — ABNORMAL HIGH (ref 70–99)
Glucose-Capillary: 105 mg/dL — ABNORMAL HIGH (ref 70–99)
Glucose-Capillary: 125 mg/dL — ABNORMAL HIGH (ref 70–99)
Glucose-Capillary: 70 mg/dL (ref 70–99)

## 2023-12-16 LAB — CBC WITH DIFFERENTIAL/PLATELET
Abs Immature Granulocytes: 0.01 K/uL (ref 0.00–0.07)
Basophils Absolute: 0 K/uL (ref 0.0–0.1)
Basophils Relative: 1 %
Eosinophils Absolute: 0.4 K/uL (ref 0.0–0.5)
Eosinophils Relative: 9 %
HCT: 34.5 % — ABNORMAL LOW (ref 39.0–52.0)
Hemoglobin: 11.6 g/dL — ABNORMAL LOW (ref 13.0–17.0)
Immature Granulocytes: 0 %
Lymphocytes Relative: 25 %
Lymphs Abs: 1 K/uL (ref 0.7–4.0)
MCH: 32.3 pg (ref 26.0–34.0)
MCHC: 33.6 g/dL (ref 30.0–36.0)
MCV: 96.1 fL (ref 80.0–100.0)
Monocytes Absolute: 0.5 K/uL (ref 0.1–1.0)
Monocytes Relative: 12 %
Neutro Abs: 2 K/uL (ref 1.7–7.7)
Neutrophils Relative %: 53 %
Platelets: 145 K/uL — ABNORMAL LOW (ref 150–400)
RBC: 3.59 MIL/uL — ABNORMAL LOW (ref 4.22–5.81)
RDW: 14.6 % (ref 11.5–15.5)
WBC: 3.8 K/uL — ABNORMAL LOW (ref 4.0–10.5)
nRBC: 0 % (ref 0.0–0.2)

## 2023-12-16 LAB — BASIC METABOLIC PANEL WITH GFR
Anion gap: 9 (ref 5–15)
BUN: 9 mg/dL (ref 8–23)
CO2: 23 mmol/L (ref 22–32)
Calcium: 8.2 mg/dL — ABNORMAL LOW (ref 8.9–10.3)
Chloride: 106 mmol/L (ref 98–111)
Creatinine, Ser: 0.9 mg/dL (ref 0.61–1.24)
GFR, Estimated: >60 mL/min (ref >60)
Glucose, Bld: 100 mg/dL — ABNORMAL HIGH (ref 70–99)
Potassium: 3.8 mmol/L (ref 3.5–5.1)
Sodium: 138 mmol/L (ref 135–145)

## 2023-12-16 LAB — URINE CULTURE: Culture: >=100000 — AB

## 2023-12-16 LAB — PHOSPHORUS: Phosphorus: 2.3 mg/dL — ABNORMAL LOW (ref 2.5–4.6)

## 2023-12-16 LAB — MAGNESIUM: Magnesium: 2 mg/dL (ref 1.7–2.4)

## 2023-12-16 MED ORDER — HYDROCODONE-ACETAMINOPHEN 5-325 MG PO TABS
1.0000 | ORAL_TABLET | Freq: Four times a day (QID) | ORAL | Status: DC | PRN
Start: 2023-12-16 — End: 2023-12-17
  Administered 2023-12-16: 1 via ORAL
  Filled 2023-12-16: qty 1

## 2023-12-16 MED ORDER — RISAQUAD PO CAPS
1.0000 | ORAL_CAPSULE | Freq: Every day | ORAL | Status: DC
  Administered 2023-12-16 – 2023-12-17 (×2): 1 via ORAL
  Filled 2023-12-16 (×2): qty 1

## 2023-12-16 MED ORDER — POTASSIUM PHOSPHATES 15 MMOLE/5ML IV SOLN
15.0000 mmol | Freq: Once | INTRAVENOUS | Status: AC
  Administered 2023-12-16: 15 mmol via INTRAVENOUS
  Filled 2023-12-16: qty 5

## 2023-12-16 MED ORDER — AMOXICILLIN-POT CLAVULANATE 875-125 MG PO TABS
1.0000 | ORAL_TABLET | Freq: Two times a day (BID) | ORAL | Status: DC
Start: 2023-12-16 — End: 2023-12-17
  Administered 2023-12-16 – 2023-12-17 (×3): 1 via ORAL
  Filled 2023-12-16 (×3): qty 1

## 2023-12-16 NOTE — Progress Notes (Signed)
 Venous duplex lower ext  has been completed. Refer to [facility] under chart review to view preliminary results.   12/16/2023  4:46 PM Keyari Kleeman, Ricka BIRCH

## 2023-12-16 NOTE — Progress Notes (Signed)
 PROGRESS NOTE    Johnny Ochoa.  FMW:[id number] DOB: Nov 19, 1945 DOA: 12/14/2023 PCP: Gerome Brunet, DO    Brief Narrative:  78 y/o male with PMH for Autoimmune Hemolytic Anemia, SAH, DMT2, HLD, Depression/Anxiety, Prostate cancer s/p radiation now with retention and patient self cath's, Ischemic Cardiomyopathy, OSA, HFrEF s/p defibrillator who presented to [facility] with generalized abd pain, NVD and body aches along with chills which started on Monday.    Assessment and Plan:  Sepsis -from UTI -culture with e coli -change to oral abx and monitor -patient self caths  Hypotension -resolved  Pneumonia -on abx  Hypophosphatemia  -replete  Concern for right-sided thrombus with associated right varicocele. Consider right lower extremity duplex ultrasound to evaluate for deep venous thrombosis  -duplex ordered  Ischemic Cardiomyopathy s/p dual chamber AICD -stable  DMT2 -SSI   Obese  Estimated body mass index is 36.17 kg/m as calculated from the following:   Height as of this encounter: 5' 5.59 (1.666 m).   Weight as of this encounter: 100.4 kg.   DVT prophylaxis: heparin  injection 5,000 Units Start: 12/15/23 0600 SCDs Start: 12/15/23 0220    Code Status: Full Code   Disposition Plan:  Level of care: Med-Surg Status is: Inpatient     Consultants:  PCCM   Subjective: No SOB, does not like out food  Objective: Vitals:   12/15/23 2228 12/16/23 0500 12/16/23 0646 12/16/23 1021  BP: (!) 118/50  122/60 (!) 108/59  Pulse: 75  70 74  Resp: 18  18 17   Temp: 98.6 F (37 C)  99.2 F (37.3 C) 97.8 F (36.6 C)  TempSrc: Oral  Oral   SpO2: 100%  99% 99%  Weight:  100.4 kg    Height:        Intake/Output Summary (Last 24 hours) at 12/16/2023 1023 Last data filed at 12/16/2023 0945 Gross per 24 hour  Intake 1511.77 ml  Output 2225 ml  Net -713.23 ml   Filed Weights   12/15/23 0048 12/15/23 0407 12/16/23 0500  Weight: 96.4 kg 96.4 kg 100.4 kg     Examination:   General: Appearance:    Obese male in no acute distress     Lungs:     respirations unlabored  Heart:    Normal heart rate.    MS:   All extremities are intact.    Neurologic:   Awake, alert       Data Reviewed: I have personally reviewed following labs and imaging studies  CBC: Recent Labs  Lab 12/14/23 1813 12/15/23 0418 12/16/23 0416  WBC 6.2 4.1 3.8*  NEUTROABS  --   --  2.0  HGB 13.5 11.8* 11.6*  HCT 40.7 36.7* 34.5*  MCV 96.4 98.4 96.1  PLT 175 163 145*   Basic Metabolic Panel: Recent Labs  Lab 12/14/23 1813 12/15/23 0418 12/16/23 0416  NA 140 140 138  K 4.1 3.6 3.8  CL 104 109 106  CO2 24 21* 23  GLUCOSE 130* 80 100*  BUN 18 13 9   CREATININE 1.15 1.00 0.90  CALCIUM  9.3 8.2* 8.2*  MG  --  1.8 2.0  PHOS  --  2.8 2.3*   GFR: Estimated Creatinine Clearance: 73.3 mL/min (by C-G formula based on SCr of 0.9 mg/dL). Liver Function Tests: Recent Labs  Lab 12/14/23 1813  AST 26  ALT 15  ALKPHOS 67  BILITOT 0.5  PROT 7.5  ALBUMIN  3.8   Recent Labs  Lab 12/14/23 1813  LIPASE 28  No results for input(s): AMMONIA in the last 168 hours. Coagulation Profile: No results for input(s): INR, PROTIME in the last 168 hours. Cardiac Enzymes: No results for input(s): CKTOTAL, CKMB, CKMBINDEX, TROPONINI in the last 168 hours. BNP (last 3 results) No results for input(s): PROBNP in the last 8760 hours. HbA1C: Recent Labs    12/15/23 0417  HGBA1C 6.7*   CBG: Recent Labs  Lab 12/15/23 1600 12/15/23 2126 12/16/23 0006 12/16/23 0406 12/16/23 0846  GLUCAP 90 83 105* 101* 70   Lipid Profile: No results for input(s): CHOL, HDL, LDLCALC, TRIG, CHOLHDL, LDLDIRECT in the last 72 hours. Thyroid  Function Tests: No results for input(s): TSH, T4TOTAL, FREET4, T3FREE, THYROIDAB in the last 72 hours. Anemia Panel: No results for input(s): VITAMINB12, FOLATE, FERRITIN, TIBC, IRON,  RETICCTPCT in the last 72 hours. Sepsis Labs: Recent Labs  Lab 12/14/23 1841 12/14/23 2140  LATICACIDVEN 1.1 0.7    Recent Results (from the past 240 hours)  Resp panel by RT-PCR (RSV, Flu A&B, Covid) Anterior Nasal Swab     Status: None   Collection Time: 12/14/23  6:14 PM   Specimen: Anterior Nasal Swab  Result Value Ref Range Status   SARS Coronavirus 2 by RT PCR NEGATIVE NEGATIVE Final    Comment: (NOTE) SARS-CoV-2 target nucleic acids are NOT DETECTED.  The SARS-CoV-2 RNA is generally detectable in upper respiratory specimens during the acute phase of infection. The lowest concentration of SARS-CoV-2 viral copies this assay can detect is 138 copies/mL. A negative result does not preclude SARS-Cov-2 infection and should not be used as the sole basis for treatment or other patient management decisions. A negative result may occur with  improper specimen collection/handling, submission of specimen other than nasopharyngeal swab, presence of viral mutation(s) within the areas targeted by this assay, and inadequate number of viral copies(<138 copies/mL). A negative result must be combined with clinical observations, patient history, and epidemiological information. The expected result is Negative.  Fact Sheet for Patients:  bloggercourse.com  Fact Sheet for Healthcare Providers:  seriousbroker.it  This test is no t yet approved or cleared by the [place]  FDA and  has been authorized for detection and/or diagnosis of SARS-CoV-2 by FDA under an Emergency Use Authorization (EUA). This EUA will remain  in effect (meaning this test can be used) for the duration of the COVID-19 declaration under Section 564(b)(1) of the Act, 21 U.S.C.section 360bbb-3(b)(1), unless the authorization is terminated  or revoked sooner.       Influenza A by PCR NEGATIVE NEGATIVE Final   Influenza B by PCR NEGATIVE NEGATIVE Final    Comment:  (NOTE) The Xpert Xpress SARS-CoV-2/FLU/RSV plus assay is intended as an aid in the diagnosis of influenza from Nasopharyngeal swab specimens and should not be used as a sole basis for treatment. Nasal washings and aspirates are unacceptable for Xpert Xpress SARS-CoV-2/FLU/RSV testing.  Fact Sheet for Patients: bloggercourse.com  Fact Sheet for Healthcare Providers: seriousbroker.it  This test is not yet approved or cleared by the [place]  FDA and has been authorized for detection and/or diagnosis of SARS-CoV-2 by FDA under an Emergency Use Authorization (EUA). This EUA will remain in effect (meaning this test can be used) for the duration of the COVID-19 declaration under Section 564(b)(1) of the Act, 21 U.S.C. section 360bbb-3(b)(1), unless the authorization is terminated or revoked.     Resp Syncytial Virus by PCR NEGATIVE NEGATIVE Final    Comment: (NOTE) Fact Sheet for Patients: bloggercourse.com  Fact Sheet for Healthcare  Providers: seriousbroker.it  This test is not yet approved or cleared by the [place]  FDA and has been authorized for detection and/or diagnosis of SARS-CoV-2 by FDA under an Emergency Use Authorization (EUA). This EUA will remain in effect (meaning this test can be used) for the duration of the COVID-19 declaration under Section 564(b)(1) of the Act, 21 U.S.C. section 360bbb-3(b)(1), unless the authorization is terminated or revoked.  Performed at [facility], [address], [place], [place] [address]   Urine Culture     Status: Abnormal   Collection Time: 12/14/23  6:41 PM   Specimen: Urine, Clean Catch  Result Value Ref Range Status   Specimen Description   Final    URINE, CLEAN CATCH Performed at [facility], [address] [place], [place] [address]    Special Requests   Final    NONE Performed at [facility], [address] [place], [place] [address]    Culture >=100,000 COLONIES/mL ESCHERICHIA COLI (A)  Final   Report Status 12/16/2023 FINAL  Final   Organism ID, Bacteria ESCHERICHIA COLI (A)  Final      Susceptibility   Escherichia coli - MIC*    AMPICILLIN 4 SENSITIVE Sensitive     CEFAZOLIN  (URINE) Value in next row Sensitive      2 SENSITIVEThis is a modified FDA-approved test that has been validated and its performance characteristics determined by the reporting laboratory.  This laboratory is certified under the Clinical Laboratory Improvement Amendments CLIA as qualified to perform high complexity clinical laboratory testing.    CEFEPIME Value in next row Sensitive      2 SENSITIVEThis is a modified FDA-approved test that has been validated and its performance characteristics determined by the reporting laboratory.  This laboratory is certified under the Clinical Laboratory Improvement Amendments CLIA as qualified to perform high complexity clinical laboratory testing.    ERTAPENEM Value in next row Sensitive      2 SENSITIVEThis is a modified FDA-approved test that has been validated and its performance characteristics determined by the reporting laboratory.  This laboratory is certified under the Clinical Laboratory Improvement Amendments CLIA as qualified to perform high complexity clinical laboratory testing.    CEFTRIAXONE  Value in next row Sensitive      2 SENSITIVEThis is a modified FDA-approved test that has been validated and its performance characteristics determined by the reporting laboratory.  This laboratory is certified under the Clinical Laboratory Improvement Amendments CLIA as qualified to perform high complexity clinical laboratory testing.    CIPROFLOXACIN Value in next row Intermediate      2 SENSITIVEThis is a modified FDA-approved test that has been validated and its performance characteristics determined by the reporting laboratory.  This laboratory  is certified under the Clinical Laboratory Improvement Amendments CLIA as qualified to perform high complexity clinical laboratory testing.    GENTAMICIN Value in next row Sensitive      2 SENSITIVEThis is a modified FDA-approved test that has been validated and its performance characteristics determined by the reporting laboratory.  This laboratory is certified under the Clinical Laboratory Improvement Amendments CLIA as qualified to perform high complexity clinical laboratory testing.    NITROFURANTOIN Value in next row Sensitive      2 SENSITIVEThis is a modified FDA-approved test that has been validated and its performance characteristics determined by the reporting laboratory.  This laboratory is certified under the Clinical Laboratory Improvement Amendments CLIA as qualified to perform  high complexity clinical laboratory testing.    TRIMETH /SULFA  Value in next row Sensitive      2 SENSITIVEThis is a modified FDA-approved test that has been validated and its performance characteristics determined by the reporting laboratory.  This laboratory is certified under the Clinical Laboratory Improvement Amendments CLIA as qualified to perform high complexity clinical laboratory testing.    AMPICILLIN/SULBACTAM Value in next row Sensitive      2 SENSITIVEThis is a modified FDA-approved test that has been validated and its performance characteristics determined by the reporting laboratory.  This laboratory is certified under the Clinical Laboratory Improvement Amendments CLIA as qualified to perform high complexity clinical laboratory testing.    PIP/TAZO Value in next row Sensitive      <=4 SENSITIVEThis is a modified FDA-approved test that has been validated and its performance characteristics determined by the reporting laboratory.  This laboratory is certified under the Clinical Laboratory Improvement Amendments CLIA as qualified to perform high complexity clinical laboratory testing.    MEROPENEM Value in  next row Sensitive      <=4 SENSITIVEThis is a modified FDA-approved test that has been validated and its performance characteristics determined by the reporting laboratory.  This laboratory is certified under the Clinical Laboratory Improvement Amendments CLIA as qualified to perform high complexity clinical laboratory testing.    * >=100,000 COLONIES/mL ESCHERICHIA COLI  Blood culture (routine x 2)     Status: None (Preliminary result)   Collection Time: 12/14/23  8:33 PM   Specimen: BLOOD  Result Value Ref Range Status   Specimen Description   Final    BLOOD LEFT ANTECUBITAL Performed at [facility], [address] [place], [place] [address]    Special Requests   Final    BOTTLES DRAWN AEROBIC AND ANAEROBIC Blood Culture adequate volume Performed at [facility], [address] [place], [place] [address]    Culture   Final    NO GROWTH 2 DAYS Performed at [facility], [address] [place], [place] [address]    Report Status PENDING  Incomplete  MRSA Next Gen by PCR, Nasal     Status: None   Collection Time: 12/15/23  1:35 AM   Specimen: Nasal Mucosa; Nasal Swab  Result Value Ref Range Status   MRSA by PCR Next Gen NOT DETECTED NOT DETECTED Final    Comment: (NOTE) The GeneXpert MRSA Assay (FDA approved for NASAL specimens only), is one component of a comprehensive MRSA colonization surveillance program. It is not intended to diagnose MRSA infection nor to guide or monitor treatment for MRSA infections. Test performance is not FDA approved in patients less than 23 years old. Performed at [facility], [address] [place], [place] [address]   Blood culture (routine x 2)     Status: None (Preliminary result)   Collection Time: 12/15/23  4:16 AM   Specimen: BLOOD RIGHT HAND  Result Value Ref Range Status   Specimen Description BLOOD RIGHT HAND  Final   Special Requests   Final    BOTTLES DRAWN AEROBIC AND ANAEROBIC Blood Culture  results may not be optimal due to an inadequate volume of blood received in culture bottles   Culture   Final    NO GROWTH 1 DAY Performed at [facility], [address] [place], [place] [address]    Report Status PENDING  Incomplete         Radiology Studies: US  Abdomen  Limited RUQ (LIVER/GB) Result Date: 12/15/2023 CLINICAL DATA:  Focal gallbladder wall thickening on a recent abdomen and pelvis CT suspicious for adenomyomatosis or a mass. Further evaluation with ultrasound was recommended. EXAM: ULTRASOUND ABDOMEN LIMITED RIGHT UPPER QUADRANT COMPARISON:  Abdomen and pelvis CT dated 12/14/2023 and 01/01/2022. Right upper quadrant abdominal ultrasound dated 06/22/2014. FINDINGS: Gallbladder: Noncalcified gallstones in the gallbladder measuring up to 6 mm in maximum diameter each. These correspond to the recently suspected gallbladder mass/focal wall thickening. No mass or wall thickening elsewhere in the gallbladder. No sonographic Murphy sign or pericholecystic fluid. Common bile duct: Diameter: 3.4 mm Liver: No focal lesion identified. Within normal limits in parenchymal echogenicity. Portal vein is patent on color Doppler imaging with normal direction of blood flow towards the liver. Other: None. IMPRESSION: 1. Cholelithiasis without evidence of cholecystitis. 2. No gallbladder mass. The recently suspected mass or focal wall thickening represented noncalcified gallstones. Electronically Signed   By: Elspeth Bathe M.D.   On: 12/15/2023 16:10   US  SCROTUM W/DOPPLER Result Date: 12/14/2023 EXAM: ULTRASOUND SCROTUM/TESTICLES WITH DOPPLER FLOW EVALUATION 12/14/2023 09:24:08 PM CLINICAL HISTORY: Scrotal pain for several days. TECHNIQUE: Duplex ultrasound using B-mode/gray scaled imaging, Doppler spectral analysis and color flow Doppler was obtained of the testicles. COMPARISON: None available. FINDINGS: RIGHT: MEASUREMENTS: The right testicle measures 2.9 x 1.7 x 1.8 cm. GREY SCALE: The right  testicle demonstrates normal homogeneous echotexture without focal lesion. No testicular microlithiasis. DOPPLER EVALUATION: There is concern by the ultrasound technologist for right sided thrombus. VARICOCELE: More distally, a right varicocele. SCROTAL SAC: No hydrocele. EPIDIDYMIS: No acute abnormality. LEFT: MEASUREMENTS: The left testicle measures 3.5 x 1.5 x 2.2 cm. GREY SCALE: The left testicle demonstrates normal homogeneous echotexture without focal lesion. No testicular microlithiasis. DOPPLER EVALUATION: There is normal arterial and venous Doppler flow within the testicle. VARICOCELE: No scrotal varicocele. SCROTAL SAC: No hydrocele. EPIDIDYMIS: No acute abnormality. Recommendation: Consider right lower quadrant ultrasound for deep venous thrombosis if clinically indicated. IMPRESSION: 1. Concern for right-sided thrombus with associated right varicocele. Consider right lower extremity duplex ultrasound to evaluate for deep venous thrombosis if clinically indicated. Electronically signed by: Morgane Naveau MD 12/14/2023 10:12 PM EDT RP Workstation: [id number]   CT ABDOMEN PELVIS W CONTRAST Result Date: 12/14/2023 EXAM: CT ABDOMEN AND PELVIS WITH CONTRAST 12/14/2023 07:43:00 PM TECHNIQUE: CT of the abdomen and pelvis was performed with the administration of 80 mL of iohexol  (OMNIPAQUE ) 300 MG/ML solution. Multiplanar reformatted images are provided for review. Automated exposure control, iterative reconstruction, and/or weight-based adjustment of the mA/kV was utilized to reduce the radiation dose to as low as reasonably achievable. COMPARISON: 07/20/2000, 01/01/2022 CT examinations. CLINICAL HISTORY: Abdominal pain, acute, nonlocalized. Generalized abd pain, NVD, body aches, chills since yesterday. Also reports inability to urinate since yesterday (performed self cath at home). FINDINGS: LOWER CHEST: Partially visualized intracardiac pacing leads. Multivessel coronary vascular calcification. LIVER: The  liver is unremarkable. GALLBLADDER AND BILE DUCTS: Focal gallbladder wall thickening on series 2 image 26. No biliary ductal dilatation. SPLEEN: No acute abnormality. PANCREAS: No acute abnormality. ADRENAL GLANDS: Thickened adrenal glands without dominant mass. KIDNEYS, URETERS AND BLADDER: Subcentimeter hypodensities in the kidneys too small to further characterize; no imaging follow up recommended. No stones in the kidneys or ureters. No hydronephrosis. No perinephric or periureteral stranding. Thick walled urinary bladder with mucosal enhancement and mild perivesical stranding. GI AND BOWEL: Stomach demonstrates no acute abnormality. Fluid-filled small bowel and colon. No acute bowel wall thickening. Diverticular disease of the left colon without  acute wall thickening. There is no bowel obstruction. PERITONEUM AND RETROPERITONEUM: No ascites. No free air. VASCULATURE: Aorta is normal in caliber. LYMPH NODES: No lymphadenopathy. REPRODUCTIVE ORGANS: No acute abnormality. BONES AND SOFT TISSUES: Small fat-containing inguinal hernias. Chronic infiltration of the subcutaneous soft tissues of the anterior abdominal wall. Small fat-containing umbilical hernia. No acute osseous abnormality. IMPRESSION: 1. Thick-walled urinary bladder with mucosal enhancement and mild perivesical stranding. Correlate for cystitis. 2. Focal gallbladder wall thickening without biliary dilatation. This could be due to adenomyomatosis or possibly a gallbladder mass but is probably grossly stable as compared with 01/18/2022. Non-emergent gallbladder ultrasound correlation is recommended. 3. Left-sided colonic diverticulosis without acute inflammatory wall thickening. 4. Fluid within the small and large bowel  possible enteritis. Electronically signed by: Luke Bun MD 12/14/2023 08:00 PM EDT RP Workstation: [id number]   DG Chest Port 1 View Result Date: 12/14/2023 CLINICAL DATA:  Cough and fever. EXAM: PORTABLE CHEST 1 VIEW COMPARISON:   03/18/2023 FINDINGS: Multi lead left-sided pacemaker in place. The heart is mildly enlarged but stable. Mediastinal contours are unchanged. Bronchial and interstitial thickening with ill-defined patchy opacity at the lung bases. No pleural effusion or pneumothorax. No acute osseous findings. IMPRESSION: Bronchial and interstitial thickening with ill-defined patchy opacity at the lung bases, suspicious for pneumonia. Electronically Signed   By: Andrea Gasman M.D.   On: 12/14/2023 19:33        Scheduled Meds:  acidophilus  1 capsule Oral Daily   amoxicillin -clavulanate  1 tablet Oral Q12H   Chlorhexidine  Gluconate Cloth  6 each Topical Daily   heparin   5,000 Units Subcutaneous Q8H   insulin  aspart  0-15 Units Subcutaneous Q4H   nystatin  cream   Topical BID   Zinc  Oxide   Topical BID   Continuous Infusions:  potassium PHOSPHATE IVPB (in mmol)       LOS: 1 day    Time spent: 45 minutes spent on chart review, discussion with nursing staff, consultants, updating family and interview/physical exam; more than 50% of that time was spent in counseling and/or coordination of care.    Johnny RAYMOND Bowl, DO [facility] Available via Epic secure chat 7am-7pm After these hours, please refer to coverage provider listed on [email or web address] 12/16/2023, 10:23 AM

## 2023-12-16 NOTE — Plan of Care (Signed)
 Patient has been up on a chair throughout my shift. Gave 1x PRN pain meds. 1x BM today. Needs attended. Positioned to comfort. Problem: Education: Goal: Knowledge of General Education information will improve Description: Including pain rating scale, medication(s)/side effects and non-pharmacologic comfort measures Outcome: Progressing   Problem: Health Behavior/Discharge Planning: Goal: Ability to manage health-related needs will improve Outcome: Progressing   Problem: Clinical Measurements: Goal: Ability to maintain clinical measurements within normal limits will improve Outcome: Progressing Goal: Will remain free from infection Outcome: Progressing Goal: Diagnostic test results will improve Outcome: Progressing Goal: Respiratory complications will improve Outcome: Progressing Goal: Cardiovascular complication will be avoided Outcome: Progressing   Problem: Activity: Goal: Risk for activity intolerance will decrease Outcome: Progressing   Problem: Nutrition: Goal: Adequate nutrition will be maintained Outcome: Progressing   Problem: Coping: Goal: Level of anxiety will decrease Outcome: Progressing   Problem: Elimination: Goal: Will not experience complications related to bowel motility Outcome: Progressing Goal: Will not experience complications related to urinary retention Outcome: Progressing   Problem: Pain Managment: Goal: General experience of comfort will improve and/or be controlled Outcome: Progressing   Problem: Safety: Goal: Ability to remain free from injury will improve Outcome: Progressing   Problem: Skin Integrity: Goal: Risk for impaired skin integrity will decrease Outcome: Progressing   Problem: Education: Goal: Ability to describe self-care measures that may prevent or decrease complications (Diabetes Survival Skills Education) will improve Outcome: Progressing Goal: Individualized Educational Video(s) Outcome: Progressing   Problem:  Coping: Goal: Ability to adjust to condition or change in health will improve Outcome: Progressing   Problem: Fluid Volume: Goal: Ability to maintain a balanced intake and output will improve Outcome: Progressing   Problem: Health Behavior/Discharge Planning: Goal: Ability to identify and utilize available resources and services will improve Outcome: Progressing Goal: Ability to manage health-related needs will improve Outcome: Progressing   Problem: Metabolic: Goal: Ability to maintain appropriate glucose levels will improve Outcome: Progressing   Problem: Nutritional: Goal: Maintenance of adequate nutrition will improve Outcome: Progressing Goal: Progress toward achieving an optimal weight will improve Outcome: Progressing   Problem: Skin Integrity: Goal: Risk for impaired skin integrity will decrease Outcome: Progressing   Problem: Tissue Perfusion: Goal: Adequacy of tissue perfusion will improve Outcome: Progressing

## 2023-12-17 ENCOUNTER — Other Ambulatory Visit (HOSPITAL_COMMUNITY): Payer: Self-pay

## 2023-12-17 DIAGNOSIS — R6521 Severe sepsis with septic shock: Secondary | ICD-10-CM | POA: Diagnosis not present

## 2023-12-17 DIAGNOSIS — A419 Sepsis, unspecified organism: Secondary | ICD-10-CM | POA: Diagnosis not present

## 2023-12-17 LAB — RENAL FUNCTION PANEL
Albumin: 2.5 g/dL — ABNORMAL LOW (ref 3.5–5.0)
Anion gap: 10 (ref 5–15)
BUN: 10 mg/dL (ref 8–23)
CO2: 23 mmol/L (ref 22–32)
Calcium: 8.5 mg/dL — ABNORMAL LOW (ref 8.9–10.3)
Chloride: 104 mmol/L (ref 98–111)
Creatinine, Ser: 0.94 mg/dL (ref 0.61–1.24)
GFR, Estimated: >60 mL/min (ref >60)
Glucose, Bld: 95 mg/dL (ref 70–99)
Phosphorus: 3.3 mg/dL (ref 2.5–4.6)
Potassium: 3.8 mmol/L (ref 3.5–5.1)
Sodium: 137 mmol/L (ref 135–145)

## 2023-12-17 MED ORDER — METOPROLOL SUCCINATE ER 25 MG PO TB24
25.0000 mg | ORAL_TABLET | Freq: Every day | ORAL | 0 refills | Status: AC
  Filled 2023-12-17: qty 30, 30d supply, fill #0

## 2023-12-17 MED ORDER — AMOXICILLIN-POT CLAVULANATE 875-125 MG PO TABS
1.0000 | ORAL_TABLET | Freq: Two times a day (BID) | ORAL | 0 refills | Status: AC
  Filled 2023-12-17: qty 16, 8d supply, fill #0

## 2023-12-17 MED ORDER — RISAQUAD PO CAPS: 1.0000 | ORAL_CAPSULE | Freq: Every day | ORAL | Status: AC

## 2023-12-17 NOTE — Plan of Care (Signed)

## 2023-12-17 NOTE — Care Plan (Signed)
 AVS printed out and instructed. TOC meds handed to patient. Wife at bedside. PIV removed

## 2023-12-17 NOTE — TOC Transition Note (Signed)
 Transition of Care [facility]) - Discharge Note Rayfield Gobble RN, BSN Inpatient Care Management Unit 4E- RN Case Manager See Treatment Team for direct phone # [phone] cross coverage  Patient Details  Name: Johnny Ochoa. MRN: [id number] Date of Birth: [birth year]  Transition of Care [facility]) CM/SW Contact:  Gobble Rayfield Hurst, RN Phone Number: 12/17/2023, 10:14 AM   Clinical Narrative:    Pt stable for transition home today, no HH or DME needs noted, no CM intervention needs noted for discharge.   Family will transport home   Final next level of care: Home/Self Care Barriers to Discharge: No Barriers Identified   Patient Goals and CMS Choice Patient states their goals for this hospitalization and ongoing recovery are:: return home   Choice offered to / list presented to : NA      Discharge Placement                 Home      Discharge Plan and Services Additional resources added to the After Visit Summary for   In-house Referral: NA Discharge Planning Services: NA Post Acute Care Choice: NA          DME Arranged: N/A DME Agency: NA       HH Arranged: NA HH Agency: NA        Social Drivers of Health (SDOH) Interventions SDOH Screenings   Food Insecurity: Patient Declined (12/16/2023)  Housing: Low Risk  (12/16/2023)  Transportation Needs: No Transportation Needs (12/16/2023)  Utilities: Patient Declined (12/16/2023)  Financial Resource Strain: Low Risk  (08/20/2020)   Received from [facility] visits prior to 04/18/2022.  Physical Activity: Inactive (08/20/2020)   Received from [facility] visits prior to 04/18/2022.  Social Connections: Patient Declined (12/16/2023)  Stress: No Stress Concern Present (08/20/2020)   Received from [facility] visits prior to 04/18/2022.  Tobacco Use: Medium Risk (12/14/2023)     Readmission Risk Interventions    12/15/2023   12:40 PM  Readmission Risk Prevention  Plan  Transportation Screening Complete  PCP or Specialist Appt within 5-7 Days Complete  Home Care Screening Complete  Medication Review (RN CM) Referral to Pharmacy

## 2023-12-17 NOTE — Discharge Summary (Signed)
 Physician Discharge Summary  Johnny Ochoa Carolee Mickey. FMW:[id number] DOB: 06-30-1945 DOA: 12/14/2023  PCP: Gerome Brunet, DO  Admit date: 12/14/2023 Discharge date: 12/17/2023  Admitted From:  Discharge disposition: Home   Recommendations for Outpatient Follow-Up:   Follow-up within a week for resumption of blood pressure medication if needed-patient's blood pressures been normotensive off majority of his blood pressure medications   Discharge Diagnosis:   Principal Problem:   Septic shock (HCC) Active Problems:   Sepsis (HCC)    Discharge Condition: Improved.  Diet recommendation: Low sodium, heart healthy.  Carbohydrate-modified  Wound care: None.  Code status: Full.   History of Present Illness:   78 y/o male with PMH for Autoimmune Hemolytic Anemia, SAH, DMT2, HLD, Depression/Anxiety, Prostate cancer s/p radiation now with retention and patient self cath's, Ischemic Cardiomyopathy, OSA, HFrEF s/p defibrillator who presented to [facility] with generalized abd pain, NVD and body aches along with chills which started on Monday.  Daughter at home also with similar (she works in audiological scientist).   Patient endorses diarrhea since last night x5-6 times. Patient endorses vomiting x3 today. Vomit, urine, and stool are without blood. Patient's daughter (who works in daycare) has had diarrhea and vomiting for the past 2 days. Patient endorses chills and feeling feverish, but did not check formal temp. Patient on low dose Levophed, normal WBC, normal Lactic acid, cxr showing PNA and CT abd showing cystitis.   Hospital Course by Problem:   Sepsis -from UTI -culture with e coli -changed to oral abx and remained afebrile-Will finish treatment -patient self caths   Hypotension -resolved-have restarted some of his home medicines but not all, defer to PCP to recheck blood pressure and restart as indicated   Pneumonia -on abx   Hypophosphatemia             -repleted   Concern  for right-sided thrombus with associated right varicocele. Consider right lower extremity duplex ultrasound to evaluate for deep venous thrombosis             -duplex showed chronic DVT   Ischemic Cardiomyopathy s/p dual chamber AICD -stable   DMT2 - Resume home meds   Obese             Estimated body mass index is 36.17 kg/m as calculated from the following:   Height as of this encounter: 5' 5.59 (1.666 m).   Weight as of this encounter: 100.4 kg.     Medical Consultants:   PCCM   Discharge Exam:   Vitals:   12/17/23 0627 12/17/23 0933  BP: 128/70 128/67  Pulse: 70 75  Resp: 17 16  Temp: 98.4 F (36.9 C) 97.7 F (36.5 C)  SpO2: 98% 97%   Vitals:   12/16/23 1753 12/16/23 2152 12/17/23 0627 12/17/23 0933  BP: 125/63 (!) 145/76 128/70 128/67  Pulse: 76 74 70 75  Resp: 17  17 16   Temp: 97.9 F (36.6 C) 98.9 F (37.2 C) 98.4 F (36.9 C) 97.7 F (36.5 C)  TempSrc:  Oral Oral Oral  SpO2: 98% 99% 98% 97%  Weight:      Height:        General exam: Appears calm and comfortable.     The results of significant diagnostics from this hospitalization (including imaging, microbiology, ancillary and laboratory) are listed below for reference.     Procedures and Diagnostic Studies:   US  Abdomen Limited RUQ (LIVER/GB) Result Date: 12/15/2023 CLINICAL DATA:  Focal gallbladder wall thickening on a  recent abdomen and pelvis CT suspicious for adenomyomatosis or a mass. Further evaluation with ultrasound was recommended. EXAM: ULTRASOUND ABDOMEN LIMITED RIGHT UPPER QUADRANT COMPARISON:  Abdomen and pelvis CT dated 12/14/2023 and 01/01/2022. Right upper quadrant abdominal ultrasound dated 06/22/2014. FINDINGS: Gallbladder: Noncalcified gallstones in the gallbladder measuring up to 6 mm in maximum diameter each. These correspond to the recently suspected gallbladder mass/focal wall thickening. No mass or wall thickening elsewhere in the gallbladder. No sonographic Murphy sign or  pericholecystic fluid. Common bile duct: Diameter: 3.4 mm Liver: No focal lesion identified. Within normal limits in parenchymal echogenicity. Portal vein is patent on color Doppler imaging with normal direction of blood flow towards the liver. Other: None. IMPRESSION: 1. Cholelithiasis without evidence of cholecystitis. 2. No gallbladder mass. The recently suspected mass or focal wall thickening represented noncalcified gallstones. Electronically Signed   By: Elspeth Bathe M.D.   On: 12/15/2023 16:10   US  SCROTUM W/DOPPLER Result Date: 12/14/2023 EXAM: ULTRASOUND SCROTUM/TESTICLES WITH DOPPLER FLOW EVALUATION 12/14/2023 09:24:08 PM CLINICAL HISTORY: Scrotal pain for several days. TECHNIQUE: Duplex ultrasound using B-mode/gray scaled imaging, Doppler spectral analysis and color flow Doppler was obtained of the testicles. COMPARISON: None available. FINDINGS: RIGHT: MEASUREMENTS: The right testicle measures 2.9 x 1.7 x 1.8 cm. GREY SCALE: The right testicle demonstrates normal homogeneous echotexture without focal lesion. No testicular microlithiasis. DOPPLER EVALUATION: There is concern by the ultrasound technologist for right sided thrombus. VARICOCELE: More distally, a right varicocele. SCROTAL SAC: No hydrocele. EPIDIDYMIS: No acute abnormality. LEFT: MEASUREMENTS: The left testicle measures 3.5 x 1.5 x 2.2 cm. GREY SCALE: The left testicle demonstrates normal homogeneous echotexture without focal lesion. No testicular microlithiasis. DOPPLER EVALUATION: There is normal arterial and venous Doppler flow within the testicle. VARICOCELE: No scrotal varicocele. SCROTAL SAC: No hydrocele. EPIDIDYMIS: No acute abnormality. Recommendation: Consider right lower quadrant ultrasound for deep venous thrombosis if clinically indicated. IMPRESSION: 1. Concern for right-sided thrombus with associated right varicocele. Consider right lower extremity duplex ultrasound to evaluate for deep venous thrombosis if clinically  indicated. Electronically signed by: Morgane Naveau MD 12/14/2023 10:12 PM EDT RP Workstation: [id number]   CT ABDOMEN PELVIS W CONTRAST Result Date: 12/14/2023 EXAM: CT ABDOMEN AND PELVIS WITH CONTRAST 12/14/2023 07:43:00 PM TECHNIQUE: CT of the abdomen and pelvis was performed with the administration of 80 mL of iohexol  (OMNIPAQUE ) 300 MG/ML solution. Multiplanar reformatted images are provided for review. Automated exposure control, iterative reconstruction, and/or weight-based adjustment of the mA/kV was utilized to reduce the radiation dose to as low as reasonably achievable. COMPARISON: 07/20/2000, 01/01/2022 CT examinations. CLINICAL HISTORY: Abdominal pain, acute, nonlocalized. Generalized abd pain, NVD, body aches, chills since yesterday. Also reports inability to urinate since yesterday (performed self cath at home). FINDINGS: LOWER CHEST: Partially visualized intracardiac pacing leads. Multivessel coronary vascular calcification. LIVER: The liver is unremarkable. GALLBLADDER AND BILE DUCTS: Focal gallbladder wall thickening on series 2 image 26. No biliary ductal dilatation. SPLEEN: No acute abnormality. PANCREAS: No acute abnormality. ADRENAL GLANDS: Thickened adrenal glands without dominant mass. KIDNEYS, URETERS AND BLADDER: Subcentimeter hypodensities in the kidneys too small to further characterize; no imaging follow up recommended. No stones in the kidneys or ureters. No hydronephrosis. No perinephric or periureteral stranding. Thick walled urinary bladder with mucosal enhancement and mild perivesical stranding. GI AND BOWEL: Stomach demonstrates no acute abnormality. Fluid-filled small bowel and colon. No acute bowel wall thickening. Diverticular disease of the left colon without acute wall thickening. There is no bowel obstruction. PERITONEUM AND RETROPERITONEUM: No ascites. No free  air. VASCULATURE: Aorta is normal in caliber. LYMPH NODES: No lymphadenopathy. REPRODUCTIVE ORGANS: No acute  abnormality. BONES AND SOFT TISSUES: Small fat-containing inguinal hernias. Chronic infiltration of the subcutaneous soft tissues of the anterior abdominal wall. Small fat-containing umbilical hernia. No acute osseous abnormality. IMPRESSION: 1. Thick-walled urinary bladder with mucosal enhancement and mild perivesical stranding. Correlate for cystitis. 2. Focal gallbladder wall thickening without biliary dilatation. This could be due to adenomyomatosis or possibly a gallbladder mass but is probably grossly stable as compared with 01/18/2022. Non-emergent gallbladder ultrasound correlation is recommended. 3. Left-sided colonic diverticulosis without acute inflammatory wall thickening. 4. Fluid within the small and large bowel  possible enteritis. Electronically signed by: Luke Bun MD 12/14/2023 08:00 PM EDT RP Workstation: [id number]   DG Chest Port 1 View Result Date: 12/14/2023 CLINICAL DATA:  Cough and fever. EXAM: PORTABLE CHEST 1 VIEW COMPARISON:  03/18/2023 FINDINGS: Multi lead left-sided pacemaker in place. The heart is mildly enlarged but stable. Mediastinal contours are unchanged. Bronchial and interstitial thickening with ill-defined patchy opacity at the lung bases. No pleural effusion or pneumothorax. No acute osseous findings. IMPRESSION: Bronchial and interstitial thickening with ill-defined patchy opacity at the lung bases, suspicious for pneumonia. Electronically Signed   By: Andrea Gasman M.D.   On: 12/14/2023 19:33     Labs:   Basic Metabolic Panel: Recent Labs  Lab 12/14/23 1813 12/15/23 0418 12/16/23 0416 12/17/23 0430  NA 140 140 138 137  K 4.1 3.6 3.8 3.8  CL 104 109 106 104  CO2 24 21* 23 23  GLUCOSE 130* 80 100* 95  BUN 18 13 9 10   CREATININE 1.15 1.00 0.90 0.94  CALCIUM  9.3 8.2* 8.2* 8.5*  MG  --  1.8 2.0  --   PHOS  --  2.8 2.3* 3.3   GFR Estimated Creatinine Clearance: 70.2 mL/min (by C-G formula based on SCr of 0.94 mg/dL). Liver Function  Tests: Recent Labs  Lab 12/14/23 1813 12/17/23 0430  AST 26  --   ALT 15  --   ALKPHOS 67  --   BILITOT 0.5  --   PROT 7.5  --   ALBUMIN  3.8 2.5*   Recent Labs  Lab 12/14/23 1813  LIPASE 28   No results for input(s): AMMONIA in the last 168 hours. Coagulation profile No results for input(s): INR, PROTIME in the last 168 hours.  CBC: Recent Labs  Lab 12/14/23 1813 12/15/23 0418 12/16/23 0416  WBC 6.2 4.1 3.8*  NEUTROABS  --   --  2.0  HGB 13.5 11.8* 11.6*  HCT 40.7 36.7* 34.5*  MCV 96.4 98.4 96.1  PLT 175 163 145*   Cardiac Enzymes: No results for input(s): CKTOTAL, CKMB, CKMBINDEX, TROPONINI in the last 168 hours. BNP: Invalid input(s): POCBNP CBG: Recent Labs  Lab 12/15/23 2126 12/16/23 0006 12/16/23 0406 12/16/23 0846 12/16/23 1146  GLUCAP 83 105* 101* 70 125*   D-Dimer No results for input(s): DDIMER in the last 72 hours. Hgb A1c Recent Labs    12/15/23 0417  HGBA1C 6.7*   Lipid Profile No results for input(s): CHOL, HDL, LDLCALC, TRIG, CHOLHDL, LDLDIRECT in the last 72 hours. Thyroid  function studies No results for input(s): TSH, T4TOTAL, T3FREE, THYROIDAB in the last 72 hours.  Invalid input(s): FREET3 Anemia work up No results for input(s): VITAMINB12, FOLATE, FERRITIN, TIBC, IRON, RETICCTPCT in the last 72 hours. Microbiology Recent Results (from the past 240 hours)  Resp panel by RT-PCR (RSV, Flu A&B, Covid) Anterior Nasal Swab  Status: None   Collection Time: 12/14/23  6:14 PM   Specimen: Anterior Nasal Swab  Result Value Ref Range Status   SARS Coronavirus 2 by RT PCR NEGATIVE NEGATIVE Final    Comment: (NOTE) SARS-CoV-2 target nucleic acids are NOT DETECTED.  The SARS-CoV-2 RNA is generally detectable in upper respiratory specimens during the acute phase of infection. The lowest concentration of SARS-CoV-2 viral copies this assay can detect is 138 copies/mL. A negative result  does not preclude SARS-Cov-2 infection and should not be used as the sole basis for treatment or other patient management decisions. A negative result may occur with  improper specimen collection/handling, submission of specimen other than nasopharyngeal swab, presence of viral mutation(s) within the areas targeted by this assay, and inadequate number of viral copies(<138 copies/mL). A negative result must be combined with clinical observations, patient history, and epidemiological information. The expected result is Negative.  Fact Sheet for Patients:  bloggercourse.com  Fact Sheet for Healthcare Providers:  seriousbroker.it  This test is no t yet approved or cleared by the [place]  FDA and  has been authorized for detection and/or diagnosis of SARS-CoV-2 by FDA under an Emergency Use Authorization (EUA). This EUA will remain  in effect (meaning this test can be used) for the duration of the COVID-19 declaration under Section 564(b)(1) of the Act, 21 U.S.C.section 360bbb-3(b)(1), unless the authorization is terminated  or revoked sooner.       Influenza A by PCR NEGATIVE NEGATIVE Final   Influenza B by PCR NEGATIVE NEGATIVE Final    Comment: (NOTE) The Xpert Xpress SARS-CoV-2/FLU/RSV plus assay is intended as an aid in the diagnosis of influenza from Nasopharyngeal swab specimens and should not be used as a sole basis for treatment. Nasal washings and aspirates are unacceptable for Xpert Xpress SARS-CoV-2/FLU/RSV testing.  Fact Sheet for Patients: bloggercourse.com  Fact Sheet for Healthcare Providers: seriousbroker.it  This test is not yet approved or cleared by the [place]  FDA and has been authorized for detection and/or diagnosis of SARS-CoV-2 by FDA under an Emergency Use Authorization (EUA). This EUA will remain in effect (meaning this test can be used) for  the duration of the COVID-19 declaration under Section 564(b)(1) of the Act, 21 U.S.C. section 360bbb-3(b)(1), unless the authorization is terminated or revoked.     Resp Syncytial Virus by PCR NEGATIVE NEGATIVE Final    Comment: (NOTE) Fact Sheet for Patients: bloggercourse.com  Fact Sheet for Healthcare Providers: seriousbroker.it  This test is not yet approved or cleared by the [place]  FDA and has been authorized for detection and/or diagnosis of SARS-CoV-2 by FDA under an Emergency Use Authorization (EUA). This EUA will remain in effect (meaning this test can be used) for the duration of the COVID-19 declaration under Section 564(b)(1) of the Act, 21 U.S.C. section 360bbb-3(b)(1), unless the authorization is terminated or revoked.  Performed at [facility], [address], [place], [place] [address]   Urine Culture     Status: Abnormal   Collection Time: 12/14/23  6:41 PM   Specimen: Urine, Clean Catch  Result Value Ref Range Status   Specimen Description   Final    URINE, CLEAN CATCH Performed at [facility], [address] [place], [place] [address]    Special Requests   Final    NONE Performed at [facility], [address] [place], [place] [address]    Culture >=100,000 COLONIES/mL ESCHERICHIA COLI (A)  Final  Report Status 12/16/2023 FINAL  Final   Organism ID, Bacteria ESCHERICHIA COLI (A)  Final      Susceptibility   Escherichia coli - MIC*    AMPICILLIN 4 SENSITIVE Sensitive     CEFAZOLIN  (URINE) Value in next row Sensitive      2 SENSITIVEThis is a modified FDA-approved test that has been validated and its performance characteristics determined by the reporting laboratory.  This laboratory is certified under the Clinical Laboratory Improvement Amendments CLIA as qualified to perform high complexity clinical laboratory testing.    CEFEPIME Value in next row Sensitive       2 SENSITIVEThis is a modified FDA-approved test that has been validated and its performance characteristics determined by the reporting laboratory.  This laboratory is certified under the Clinical Laboratory Improvement Amendments CLIA as qualified to perform high complexity clinical laboratory testing.    ERTAPENEM Value in next row Sensitive      2 SENSITIVEThis is a modified FDA-approved test that has been validated and its performance characteristics determined by the reporting laboratory.  This laboratory is certified under the Clinical Laboratory Improvement Amendments CLIA as qualified to perform high complexity clinical laboratory testing.    CEFTRIAXONE  Value in next row Sensitive      2 SENSITIVEThis is a modified FDA-approved test that has been validated and its performance characteristics determined by the reporting laboratory.  This laboratory is certified under the Clinical Laboratory Improvement Amendments CLIA as qualified to perform high complexity clinical laboratory testing.    CIPROFLOXACIN Value in next row Intermediate      2 SENSITIVEThis is a modified FDA-approved test that has been validated and its performance characteristics determined by the reporting laboratory.  This laboratory is certified under the Clinical Laboratory Improvement Amendments CLIA as qualified to perform high complexity clinical laboratory testing.    GENTAMICIN Value in next row Sensitive      2 SENSITIVEThis is a modified FDA-approved test that has been validated and its performance characteristics determined by the reporting laboratory.  This laboratory is certified under the Clinical Laboratory Improvement Amendments CLIA as qualified to perform high complexity clinical laboratory testing.    NITROFURANTOIN Value in next row Sensitive      2 SENSITIVEThis is a modified FDA-approved test that has been validated and its performance characteristics determined by the reporting laboratory.  This laboratory  is certified under the Clinical Laboratory Improvement Amendments CLIA as qualified to perform high complexity clinical laboratory testing.    TRIMETH /SULFA  Value in next row Sensitive      2 SENSITIVEThis is a modified FDA-approved test that has been validated and its performance characteristics determined by the reporting laboratory.  This laboratory is certified under the Clinical Laboratory Improvement Amendments CLIA as qualified to perform high complexity clinical laboratory testing.    AMPICILLIN/SULBACTAM Value in next row Sensitive      2 SENSITIVEThis is a modified FDA-approved test that has been validated and its performance characteristics determined by the reporting laboratory.  This laboratory is certified under the Clinical Laboratory Improvement Amendments CLIA as qualified to perform high complexity clinical laboratory testing.    PIP/TAZO Value in next row Sensitive      <=4 SENSITIVEThis is a modified FDA-approved test that has been validated and its performance characteristics determined by the reporting laboratory.  This laboratory is certified under the Clinical Laboratory Improvement Amendments CLIA as qualified to perform high complexity clinical laboratory testing.    MEROPENEM Value in next row Sensitive      <=  4 SENSITIVEThis is a modified FDA-approved test that has been validated and its performance characteristics determined by the reporting laboratory.  This laboratory is certified under the Clinical Laboratory Improvement Amendments CLIA as qualified to perform high complexity clinical laboratory testing.    * >=100,000 COLONIES/mL ESCHERICHIA COLI  Blood culture (routine x 2)     Status: None (Preliminary result)   Collection Time: 12/14/23  8:33 PM   Specimen: BLOOD  Result Value Ref Range Status   Specimen Description   Final    BLOOD LEFT ANTECUBITAL Performed at [facility], [address] [place], [place] [address]    Special Requests   Final     BOTTLES DRAWN AEROBIC AND ANAEROBIC Blood Culture adequate volume Performed at [facility], [address] [place], [place] [address]    Culture   Final    NO GROWTH 3 DAYS Performed at [facility], [address], [place], [place] [address]    Report Status PENDING  Incomplete  MRSA Next Gen by PCR, Nasal     Status: None   Collection Time: 12/15/23  1:35 AM   Specimen: Nasal Mucosa; Nasal Swab  Result Value Ref Range Status   MRSA by PCR Next Gen NOT DETECTED NOT DETECTED Final    Comment: (NOTE) The GeneXpert MRSA Assay (FDA approved for NASAL specimens only), is one component of a comprehensive MRSA colonization surveillance program. It is not intended to diagnose MRSA infection nor to guide or monitor treatment for MRSA infections. Test performance is not FDA approved in patients less than 76 years old. Performed at [facility], [address] [place], [place] [address]   Blood culture (routine x 2)     Status: None (Preliminary result)   Collection Time: 12/15/23  4:16 AM   Specimen: BLOOD RIGHT HAND  Result Value Ref Range Status   Specimen Description BLOOD RIGHT HAND  Final   Special Requests   Final    BOTTLES DRAWN AEROBIC AND ANAEROBIC Blood Culture results may not be optimal due to an inadequate volume of blood received in culture bottles   Culture   Final    NO GROWTH 2 DAYS Performed at [facility], [address], [place], [place] [address]    Report Status PENDING  Incomplete     Discharge Instructions:    Allergies as of 12/17/2023       Reactions   Glucophage [metformin] Diarrhea        Medication List     PAUSE taking these medications    empagliflozin  25 MG Tabs tablet Wait to take this until your doctor or other care provider tells you to start again. Commonly known as: JARDIANCE  Take 25 mg by mouth daily.   metoprolol  succinate 25 MG 24 hr tablet Wait to take this until your doctor or other care  provider tells you to start again. Commonly known as: TOPROL -XL Take 25-50 mg by mouth See admin instructions. Take 1 tablet (25mg ) by mouth every evening. May increase to 2 tablets (50mg ) at bedtime if needed for elevated blood pressure or HR. You also have another medication with the same name that you may need to continue taking.   potassium chloride  10 MEQ tablet Wait to take this until your doctor or other care provider tells you to start again. Commonly known as: KLOR-CON  M Take 10 mEq by mouth daily.       TAKE these medications  acetaminophen  500 MG tablet Commonly known as: TYLENOL  Take 1,000 mg by mouth 2 (two) times daily as needed for headache or fever (pain).   acidophilus Caps capsule Take 1 capsule by mouth daily. Start taking on: December 18, 2023   Advair Diskus 100-50 MCG/ACT Aepb Generic drug: fluticasone-salmeterol Inhale 1 puff into the lungs 2 (two) times daily as needed (wheezing, shortness of breath).   amoxicillin -clavulanate 875-125 MG tablet Commonly known as: AUGMENTIN  Take 1 tablet by mouth every 12 (twelve) hours.   ascorbic acid  500 MG tablet Commonly known as: VITAMIN C Take 500 mg by mouth 2 (two) times daily.   aspirin  EC 81 MG tablet Take 81 mg by mouth daily.   atorvastatin  80 MG tablet Commonly known as: LIPITOR Take 80 mg by mouth every evening.   clopidogrel  75 MG tablet Commonly known as: PLAVIX  Take 1 tablet (75 mg total) by mouth daily. Please hold for now.  You can resume on 08/03/2022 if you do not have any further episodes of bleeding.   digoxin  0.125 MG tablet Commonly known as: LANOXIN  Take 125 mcg by mouth daily.   docusate sodium  100 MG capsule Commonly known as: COLACE Take 100 mg by mouth 2 (two) times daily.   DULoxetine  30 MG capsule Commonly known as: CYMBALTA  Take 90 mg by mouth daily.   ezetimibe  10 MG tablet Commonly known as: ZETIA  Take 10 mg by mouth daily.   furosemide  40 MG tablet Commonly  known as: LASIX  Take 1 tablet (40 mg total) by mouth daily. Take an extra dose if you gain 2 lbs in 24 hours. What changed:  when to take this additional instructions   gabapentin  400 MG capsule Commonly known as: NEURONTIN  Take 400 mg by mouth 3 (three) times daily.   Gemtesa 75 MG Tabs Generic drug: Vibegron Take 75 mg by mouth daily.   Iron 325 (65 Fe) MG Tabs Take 325 mg by mouth daily.   isosorbide  mononitrate 30 MG 24 hr tablet Commonly known as: IMDUR  Take 90 mg by mouth daily.   Magnesium  400 MG Tabs Take 400 mg by mouth daily.   methocarbamol  500 MG tablet Commonly known as: ROBAXIN  Take 1 tablet (500 mg total) by mouth every 6 (six) hours as needed for muscle spasms. What changed: when to take this   metoprolol  succinate 25 MG 24 hr tablet Commonly known as: TOPROL -XL Take 1 tablet (25 mg total) by mouth daily. What changed:  medication strength how much to take   multivitamin with minerals Tabs tablet Take 1 tablet by mouth daily.   nitroGLYCERIN  0.4 MG SL tablet Commonly known as: NITROSTAT  Place 0.4 mg under the tongue every 5 (five) minutes x 3 doses as needed for chest pain.   omeprazole 20 MG capsule Commonly known as: PRILOSEC Take 20 mg by mouth daily.   oxyCODONE -acetaminophen  10-325 MG tablet Commonly known as: PERCOCET Take 1 tablet by mouth every 8 (eight) hours as needed (severey pain).   Ozempic  (2 MG/DOSE) 8 MG/3ML Sopn Generic drug: Semaglutide  (2 MG/DOSE) Inject 2 mg into the skin once a week What changed:  how much to take how to take this when to take this   polyethylene glycol 17 g packet Commonly known as: MIRALAX  / GLYCOLAX  Take 17 g by mouth daily as needed for mild constipation.   primidone  50 MG tablet Commonly known as: MYSOLINE  Take 150 mg by mouth daily.   sacubitril -valsartan  24-26 MG Commonly known as: ENTRESTO  Take 1 tablet by mouth  2 (two) times daily.   tamsulosin  0.4 MG Caps capsule Commonly known as:  FLOMAX  Take 0.8 mg by mouth every evening.   traZODone  150 MG tablet Commonly known as: DESYREL  Take 150 mg by mouth every evening.   Vitamin D3 50 MCG (2000 UT) Tabs Take 2,000 Units by mouth daily.          Time coordinating discharge: 45 min  Signed:  Harlene RAYMOND Bowl DO  [facility] 12/17/2023, 9:42 AM

## 2023-12-19 LAB — CULTURE, BLOOD (ROUTINE X 2)
Culture: NO GROWTH
Special Requests: ADEQUATE

## 2023-12-20 ENCOUNTER — Telehealth: Payer: Self-pay

## 2023-12-20 LAB — CULTURE, BLOOD (ROUTINE X 2): Culture: NO GROWTH

## 2023-12-20 NOTE — Transitions of Care (Post Inpatient/ED Visit) (Signed)
   12/20/2023  Name: Johnny Ochoa. MRN: [id number] DOB: 1945/10/19  Today's TOC FU Call Status: Today's TOC FU Call Status:: Unsuccessful Call (1st Attempt) Unsuccessful Call (1st Attempt) Date: 12/20/23  Attempted to reach the patient regarding the most recent Inpatient/ED visit.  Follow Up Plan: Additional outreach attempts will be made to reach the patient to complete the Transitions of Care (Post Inpatient/ED visit) call.   Emmaclaire Switala J. Kiril Hippe RN, MSN [facility], [facility] RN Care Manager Direct Dial: [phone]  Fax: [phone] Website: [email or web address]

## 2023-12-23 ENCOUNTER — Telehealth: Payer: Self-pay

## 2023-12-23 NOTE — Transitions of Care (Post Inpatient/ED Visit) (Signed)
 12/23/2023  Name: Johnny Ochoa. MRN: [id number] DOB: September 09, 1945  Today's TOC FU Call Status: Today's TOC FU Call Status:: Successful TOC FU Call Completed TOC FU Call Complete Date: 12/23/23 Patient's Name and Date of Birth confirmed.  Transition Care Management Follow-up Telephone Call Date of Discharge: 12/17/23 Discharge Facility: [facility]) Type of Discharge: Inpatient Admission Primary Inpatient Discharge Diagnosis:: Septic shock How have you been since you were released from the hospital?: Better Any questions or concerns?: No  Items Reviewed: Did you receive and understand the discharge instructions provided?: Yes Medications obtained,verified, and reconciled?: Yes (Medications Reviewed) Any new allergies since your discharge?: No Dietary orders reviewed?: Yes Type of Diet Ordered:: Low sodium herat healthy Do you have support at home?: Yes People in Home [RPT]: spouse Name of Support/Comfort Primary Source: Nellie  Medications Reviewed Today: Medications Reviewed Today     Reviewed by Timmi Devora, RN (Case Manager) on 12/23/23 at 1323  Med List Status: <None>   Medication Order Taking? Sig Documenting Provider Last Dose Status Informant  acetaminophen  (TYLENOL ) 500 MG tablet [id number] Yes Take 1,000 mg by mouth 2 (two) times daily as needed for headache or fever (pain). [provider]  Active Pharmacy Records, Self  acidophilus (RISAQUAD) CAPS capsule [id number]  Take 1 capsule by mouth daily. Vann, Jessica U, DO  Active   amoxicillin -clavulanate (AUGMENTIN ) 875-125 MG tablet [id number] Yes Take 1 tablet by mouth every 12 (twelve) hours. Vann, Jessica U, DO  Active   ascorbic acid  (VITAMIN C) 500 MG tablet [id number] Yes Take 500 mg by mouth 2 (two) times daily. [provider]  Active Self, Pharmacy Records  aspirin  EC 81 MG tablet [id number] Yes Take 81 mg by mouth daily. [provider]  Active Pharmacy Records, Self  atorvastatin   (LIPITOR) 80 MG tablet [id number] Yes Take 80 mg by mouth every evening. [provider]  Active Self, Pharmacy Records           Med Note (COFFELL, JON HERO   Thu Dec 16, 2023 12:53 PM) [facility]/23 #90.  Cholecalciferol  (VITAMIN D3) 50 MCG (2000 UT) TABS [id number]  Take 2,000 Units by mouth daily. [provider]  Active Pharmacy Records, Self  clopidogrel  (PLAVIX ) 75 MG tablet [id number] Yes Take 1 tablet (75 mg total) by mouth daily. Please hold for now.  You can resume on 08/03/2022 if you do not have any further episodes of bleeding. Verdene Purchase, MD  Active Pharmacy Records, Self           Med Note (COFFELL, JON HERO   Thu Dec 16, 2023 12:53 PM) [facility] 10[facility]28/[facility] #90.  digoxin  (LANOXIN ) 0.125 MG tablet [id number] Yes Take 125 mcg by mouth daily. [provider]  Active Pharmacy Records, Self           Med Note (COFFELL, JON HERO Schaumann Dec 16, 2023 12:54 PM) [facility] - next fill scheduled 01/01/24 #90.  docusate sodium  (COLACE) 100 MG capsule [id number] Yes Take 100 mg by mouth 2 (two) times daily. [provider]  Active Pharmacy Records, Self  DULoxetine  (CYMBALTA ) 30 MG capsule [id number] Yes Take 90 mg by mouth daily. [provider]  Active Pharmacy Records, Self           Med Note (COFFELL, JON HERO Schaumann Dec 16, 2023 12:55 PM) [facility] 10/06/23 #270.  empagliflozin  (JARDIANCE ) 25 MG TABS tablet [id number]  Take 25 mg by  mouth daily. [provider]  Active Self, Pharmacy Records           Med Note (COFFELL, JON CHRISTELLA Schaumann Dec 16, 2023 12:54 PM) [facility] 10/31/23 #90.  ezetimibe  (ZETIA ) 10 MG tablet [id number] Yes Take 10 mg by mouth daily. [provider]  Active Self, Pharmacy Records           Med Note (COFFELL, JON CHRISTELLA   Thu Dec 16, 2023 12:55 PM) [facility] 11/07/23 #90.  Ferrous Sulfate (IRON) 325 (65 Fe) MG TABS [id number] Yes Take 325 mg by mouth daily. [provider]  Active Self, Pharmacy Records   fluticasone-salmeterol (ADVAIR DISKUS) 100-50 MCG/ACT AEPB [id number] Yes Inhale 1 puff into the lungs 2 (two) times daily as needed (wheezing, shortness of breath). [provider]  Active Pharmacy Records, Self           Med Note (COFFELL, JON CHRISTELLA Schaumann Dec 16, 2023 12:59 PM) [facility] 08/10/23 #3 inhalers.  furosemide  (LASIX ) 40 MG tablet [id number]  Take 1 tablet (40 mg total) by mouth daily. Take an extra dose if you gain 2 lbs in 24 hours.  Patient taking differently: Take 40 mg by mouth See admin instructions. Take 1 tablet (40mg ) by mouth twice daily. Take an additional 1 tablet in the morning as needed for weight gain > 2lbs in 24 hours.   Rama, Tawni SQUIBB, MD  Active Self, Pharmacy Records           Med Note (COFFELL, JON CHRISTELLA Schaumann Dec 16, 2023 12:58 PM) [facility] 11/22/23 #180.  gabapentin  (NEURONTIN ) 400 MG capsule [id number] Yes Take 400 mg by mouth 3 (three) times daily.  [provider]  Active Self, Pharmacy Records           Med Note (COFFELL, JON CHRISTELLA Schaumann Dec 16, 2023 12:58 PM) [facility]  : [facility] 10/31/23 #270.  isosorbide  mononitrate (IMDUR ) 30 MG 24 hr tablet [id number]  Take 90 mg by mouth daily. [provider]  Active Pharmacy Records, Self           Med Note (COFFELL, JON CHRISTELLA Schaumann Dec 16, 2023 12:59 PM) [facility] :  LF 11/22/23 #270.  Magnesium  400 MG TABS [id number] Yes Take 400 mg by mouth daily. [provider]  Active Self, Pharmacy Records           Med Note LUCIOUS JULIAN   Dju Jul 20, 2020  9:49 AM)    methocarbamol  (ROBAXIN ) 500 MG tablet [id number]  Take 1 tablet (500 mg total) by mouth every 6 (six) hours as needed for muscle spasms.  Patient taking differently: Take 500 mg by mouth at bedtime.   Leigh Valery RAMAN, PA-C  Active Self, Pharmacy Records           Med Note (COFFELL, JON CHRISTELLA   Thu Dec 16, 2023  1:00 PM) [place] ; LF 10/28/23 #60.  metoprolol  succinate (TOPROL -XL) 25 MG 24 hr tablet [id number]  Take 25-50 mg by mouth See admin instructions. Take 1  tablet (25mg ) by mouth every evening. May increase to 2 tablets (50mg ) at bedtime if needed for elevated blood pressure or HR. [provider]  Active Self, Pharmacy Records           Med Note (COFFELL, JON CHRISTELLA   Thu Dec 16, 2023  1:24 PM) Patient confirms taking 2 strengths of metoprolol  succinate. Usual directions of  50mg  (1/2 tablet of 100mg ) QAM + 25mg  QPM (but may increase to 50mg  QPM if needed)  metoprolol  succinate (TOPROL -XL) 25 MG 24 hr tablet [id number] Yes Take 1 tablet (25 mg total) by mouth daily. Vann, Jessica U, DO  Active   Multiple Vitamin (MULTIVITAMIN WITH MINERALS) TABS tablet [id number] Yes Take 1 tablet by mouth daily. [provider]  Active Self, Pharmacy Records  nitroGLYCERIN  (NITROSTAT ) 0.4 MG SL tablet [id number] Yes Place 0.4 mg under the tongue every 5 (five) minutes x 3 doses as needed for chest pain. [provider]  Active Self, Pharmacy Records           Med Note LAURALYN, TIFFANI S   Wed Feb 26, 2016  9:33 AM)    omeprazole (PRILOSEC) 20 MG capsule [id number] Yes Take 20 mg by mouth daily. [provider]  Active Self, Pharmacy Records           Med Note (COFFELL, JON HERO   Thu Dec 16, 2023  1:10 PM)    oxyCODONE -acetaminophen  (PERCOCET) 10-325 MG tablet [id number] Yes Take 1 tablet by mouth every 8 (eight) hours as needed (severey pain). [provider]  Active Pharmacy Records, Self           Med Note (COFFELL, JON HERO   Thu Dec 16, 2023  1:05 PM) [facility] 08/10/23 #90.  polyethylene glycol (MIRALAX  / GLYCOLAX ) 17 g packet [id number] Yes Take 17 g by mouth daily as needed for mild constipation. Verdene Purchase, MD  Active Pharmacy Records, Self  potassium chloride  (K-DUR,KLOR-CON ) 10 MEQ tablet [id number]  Take 10 mEq by mouth daily. [provider]  Active Self, Pharmacy Records  primidone  (MYSOLINE ) 50 MG tablet [id number] Yes Take 150 mg by mouth daily.  Patient taking differently: Take 25 mg by mouth daily.    [provider]  Active Self, Pharmacy Records           Med Note (COFFELL, JON HERO   Thu Dec 16, 2023  1:06 PM) [facility] 10/30/23 #270.  sacubitril -valsartan  (ENTRESTO ) 24-26 MG [id number] Yes Take 1 tablet by mouth 2 (two) times daily. [provider]  Active Pharmacy Records, Self           Med Note (COFFELL, JON HERO   Thu Dec 16, 2023  1:07 PM) [facility] 09/17/23 #180.  Semaglutide , 2 MG/DOSE, (OZEMPIC , 2 MG/DOSE,) 8 MG/3ML SOPN [id number] Yes Inject 2 mg into the skin once a week  Patient taking differently: Inject 2 mg into the skin every Sunday.     Active Self, Pharmacy Records  tamsulosin  (FLOMAX ) 0.4 MG CAPS capsule [id number] Yes Take 0.8 mg by mouth every evening. [provider]  Active Self, Pharmacy Records           Med Note (COFFELL, JON HERO   Thu Dec 16, 2023  1:07 PM) [facility] 11/12/23 #180.  traZODone  (DESYREL ) 150 MG tablet [id number] Yes Take 150 mg by mouth every evening. [provider]  Active Pharmacy Records, Self           Med Note (COFFELL, JON HERO   Thu Dec 16, 2023  1:08 PM) [facility] :  [facility] 11/24/23 #90.  Vibegron (GEMTESA) 75 MG TABS [id number] Yes Take 75 mg by mouth daily. [provider]  Active Pharmacy Records, Self           Med Note (COFFELL, JON HERO   Thu Dec 16, 2023  1:09 PM) [facility] :  unknown LF - next fill scheduled 02/01/24 #90.            Home Care and Equipment/Supplies: Were Home Health Services Ordered?: NA Any new equipment or medical supplies ordered?: NA  Functional Questionnaire: Do you need assistance with bathing/showering or dressing?: No Do you need assistance with meal preparation?: Yes (spouse assists) Do you need assistance with eating?: No Do you have difficulty maintaining continence: No Do you need assistance with getting out of bed/getting out of a chair/moving?: No Do you have difficulty managing or taking your medications?: No  Follow up appointments reviewed: PCP Follow-up appointment  confirmed?: Yes Date of PCP follow-up appointment?: 12/23/23 Follow-up Provider: Dr. gerome Specialist [facility] Follow-up appointment confirmed?: NA Do you need transportation to your follow-up appointment?: No Do you understand care options if your condition(s) worsen?: Yes-patient verbalized understanding  SDOH Interventions Today    Flowsheet Row Most Recent Value  SDOH Interventions   Food Insecurity Interventions Intervention Not Indicated  Housing Interventions Intervention Not Indicated  Transportation Interventions Intervention Not Indicated  Utilities Interventions Intervention Not Indicated    Arael Piccione J. Brace Welte RN, MSN [facility]  [facility], [facility] RN Care Manager Direct Dial: [phone]  Fax: [phone] Website: [email or web address]

## 2023-12-23 NOTE — Patient Instructions (Signed)
 Visit Information  Thank you for taking time to visit with me today. Please don't hesitate to contact me if I can be of assistance to you before our next scheduled telephone appointment.  Our next appointment is by telephone on 12/27/23 at 1100 am  Following is a copy of your care plan:   Goals Addressed             This Visit's Progress    [facility] Transitions of Care (TOC) Care Plan       Problems:  Recent Hospitalization for treatment of Septic Shock Knowledge Deficit Related to Septic Shock/UTI and Pneumonia  Goal:  Over the next 30 days, the patient will not experience hospital readmission  Interventions:  Transitions of Care: Doctor Visits  - discussed the importance of doctor visits Reviewed Signs and symptoms of infection Advised on: UTI Drink plenty of water  or other fluids. Take antibiotics exactly as your healthcare provider tells you. Monitor for fever, chills, increased urination, burning on urination, blood in urine, nausea, vomiting, or pain in abdomen Advised on: Pneumonia  Contact physician if: You have a new or higher fever. You are coughing more deeply or more often. You are having increased shortness of breath. You are not getting better after 2 days (48 hours). You do not get better as expected   Patient Self Care Activities:  Attend all scheduled provider appointments Call provider office for new concerns or questions  Notify RN Care Manager of TOC call rescheduling needs Participate in Transition of Care Program/Attend TOC scheduled calls Take medications as prescribed   Monitor for fever, chills., increased cough, increased shortness of breath, burning on urination, nausea, vomiting, or pain in abdomen  Plan:  The patient has been provided with contact information for the care management team and has been advised to call with any health related questions or concerns.         Patient verbalizes understanding of instructions and care plan  provided today and agrees to view in MyChart. Active MyChart status and patient understanding of how to access instructions and care plan via MyChart confirmed with patient.     The patient has been provided with contact information for the care management team and has been advised to call with any health related questions or concerns.   Please call the care guide team at [phone] if you need to cancel or reschedule your appointment.   Please call the Suicide and Crisis Lifeline: 988 if you are experiencing a Mental Health or Behavioral Health Crisis or need someone to talk to.  Myasia Sinatra J. Kingstin Heims RN, MSN [facility], [facility] RN Care Manager Direct Dial: [phone]  Fax: [phone] Website: [email or web address]

## 2023-12-27 ENCOUNTER — Other Ambulatory Visit: Payer: Self-pay

## 2023-12-27 NOTE — Transitions of Care (Post Inpatient/ED Visit) (Signed)
 Transition of Care week 2  Visit Note  12/27/2023  Name: Johnny Ochoa. MRN: [id number]          DOB: 05-09-1945  Situation: Patient enrolled in [facility] 30-day program. Visit completed with patient by telephone.   Background:   Initial Transition Care Management Follow-up Telephone Call Discharge Date and Diagnosis: 12/17/23, Septic shock   Past Medical History:  Diagnosis Date   AICD (automatic cardioverter/defibrillator) present    Arthritis    Cancer (HCC)    prostate  (Radiation only)   Cellulitis    CHF (congestive heart failure) (HCC)    Complication of anesthesia    slow to come out of anesthesia   Coronary artery disease    Diabetes mellitus    Dyspnea    GERD (gastroesophageal reflux disease)    Gout    Hypertension    Kidney stones    Leg swelling    Low back pain    Myocardial infarction (HCC) 12/09/1993   Obese    OSA (obstructive sleep apnea)    Peripheral neuropathy    PTSD (post-traumatic stress disorder)     Assessment: Patient Reported Symptoms: Cognitive Cognitive Status: Alert and oriented to person, place, and time, Normal speech and language skills      Neurological Neurological Review of Symptoms: No symptoms reported    HEENT HEENT Symptoms Reported: No symptoms reported      Cardiovascular Cardiovascular Symptoms Reported: No symptoms reported Weight: 214 lb (97.1 kg) Cardiovascular Comment: Hx of heart failure  Respiratory Respiratory Symptoms Reported: No symptoms reported Additional Respiratory Details: Recent pneumonia.  Finished with antibiotics. Denies any increased cough, fever or chills.    Endocrine Endocrine Symptoms Reported: No symptoms reported Is patient diabetic?: Yes Is patient checking blood sugars at home?: Yes List most recent blood sugar readings, include date and time of day: Blood sugars running around 100 Endocrine Self-Management Outcome: 4 (good)  Gastrointestinal Gastrointestinal Symptoms Reported: No  symptoms reported      Genitourinary Genitourinary Symptoms Reported: Other Other Genitourinary Symptoms: Has problems with urinary retention from scarring after prostate cancer. Self cath's about 1-2 per week per patient.    Integumentary Integumentary Symptoms Reported: No symptoms reported    Musculoskeletal Musculoskelatal Symptoms Reviewed: Unsteady gait Additional Musculoskeletal Details: Uses walker Musculoskeletal Management Strategies: Routine screening, Activity Musculoskeletal Self-Management Outcome: 4 (good)      Psychosocial Psychosocial Symptoms Reported: No symptoms reported         There were no vitals filed for this visit.  Medications Reviewed Today     Reviewed by Gervase Colberg, RN (Case Manager) on 12/27/23 at 1135  Med List Status: <None>   Medication Order Taking? Sig Documenting Provider Last Dose Status Informant  acetaminophen  (TYLENOL ) 500 MG tablet [id number] Yes Take 1,000 mg by mouth 2 (two) times daily as needed for headache or fever (pain). [provider]  Active Pharmacy Records, Self  acidophilus (RISAQUAD) CAPS capsule [id number] Yes Take 1 capsule by mouth daily. Vann, Jessica U, DO  Active   amoxicillin -clavulanate (AUGMENTIN ) 875-125 MG tablet [id number]  Take 1 tablet by mouth every 12 (twelve) hours.  Patient not taking: Reported on 12/27/2023   Vann, Jessica U, DO  Active   ascorbic acid  (VITAMIN C) 500 MG tablet [id number] Yes Take 500 mg by mouth 2 (two) times daily. [provider]  Active Self, Pharmacy Records  aspirin  EC 81 MG tablet [id number] Yes Take 81 mg by mouth daily. [provider]  Active  Pharmacy Records, Self  atorvastatin  (LIPITOR) 80 MG tablet [id number] Yes Take 80 mg by mouth every evening. [provider]  Active Self, Pharmacy Records           Med Note (COFFELL, JON HERO   Thu Dec 16, 2023 12:53 PM) [facility] #90.  Cholecalciferol  (VITAMIN D3) 50 MCG (2000 UT) TABS [id number] Yes  Take 2,000 Units by mouth daily. [provider]  Active Pharmacy Records, Self  clopidogrel  (PLAVIX ) 75 MG tablet [id number] Yes Take 1 tablet (75 mg total) by mouth daily. Please hold for now.  You can resume on 08/03/2022 if you do not have any further episodes of bleeding. Verdene Purchase, MD  Active Pharmacy Records, Self           Med Note (COFFELL, JON HERO   Thu Dec 16, 2023 12:53 PM) [facility] 10[facility] #90.  digoxin  (LANOXIN ) 0.125 MG tablet [id number] Yes Take 125 mcg by mouth daily. [provider]  Active Pharmacy Records, Self           Med Note (COFFELL, JON HERO Schaumann Dec 16, 2023 12:54 PM) [facility] - next fill scheduled 01/01/24 #90.  docusate sodium  (COLACE) 100 MG capsule [id number] Yes Take 100 mg by mouth 2 (two) times daily. [provider]  Active Pharmacy Records, Self  DULoxetine  (CYMBALTA ) 30 MG capsule [id number] Yes Take 90 mg by mouth daily. [provider]  Active Pharmacy Records, Self           Med Note (COFFELL, JON HERO Schaumann Dec 16, 2023 12:55 PM) [facility] 10/06/23 #270.  empagliflozin  (JARDIANCE ) 25 MG TABS tablet [id number]  Take 25 mg by mouth daily. [provider]  Active Self, Pharmacy Records           Med Note (COFFELL, JON HERO Schaumann Dec 16, 2023 12:54 PM) [facility] 10/31/23 #90.  ezetimibe  (ZETIA ) 10 MG tablet [id number] Yes Take 10 mg by mouth daily. [provider]  Active Self, Pharmacy Records           Med Note (COFFELL, JON HERO   Thu Dec 16, 2023 12:55 PM) [facility] 11/07/23 #90.  Ferrous Sulfate (IRON) 325 (65 Fe) MG TABS [id number] Yes Take 325 mg by mouth daily. [provider]  Active Self, Pharmacy Records  fluticasone-salmeterol (ADVAIR DISKUS) 100-50 MCG/ACT AEPB [id number] Yes Inhale 1 puff into the lungs 2 (two) times daily as needed (wheezing, shortness of breath). [provider]  Active Pharmacy Records, Self           Med Note (COFFELL, JON HERO Schaumann Dec 16, 2023 12:59 PM)  [facility] #3 inhalers.  furosemide  (LASIX ) 40 MG tablet [id number]  Take 1 tablet (40 mg total) by mouth daily. Take an extra dose if you gain 2 lbs in 24 hours.  Patient taking differently: Take 40 mg by mouth See admin instructions. Take 1 tablet (40mg ) by mouth twice daily. Take an additional 1 tablet in the morning as needed for weight gain > 2lbs in 24 hours.   Rama, Tawni SQUIBB, MD  Active Self, Pharmacy Records           Med Note (COFFELL, JON HERO Schaumann Dec 16, 2023 12:58 PM) [facility] 11/22/23 #180.  gabapentin  (NEURONTIN ) 400 MG capsule [id number] Yes Take 400 mg by mouth 3 (three) times daily.  [provider]  Active Self, Pharmacy Records           Med Note (COFFELL, JON CHRISTELLA Schaumann Dec 16, 2023 12:58 PM) [facility]  : [facility] 10/31/23 #270.  isosorbide  mononitrate (IMDUR ) 30 MG 24 hr tablet [id number]  Take 90 mg by mouth daily. [provider]  Active Pharmacy Records, Self           Med Note (COFFELL, JON CHRISTELLA Schaumann Dec 16, 2023 12:59 PM) [facility] :  [facility] 11/22/23 #270.  Magnesium  400 MG TABS [id number] Yes Take 400 mg by mouth daily. [provider]  Active Self, Pharmacy Records           Med Note LUCIOUS JULIAN   Dju Jul 20, 2020  9:49 AM)    methocarbamol  (ROBAXIN ) 500 MG tablet [id number] Yes Take 1 tablet (500 mg total) by mouth every 6 (six) hours as needed for muscle spasms.  Patient taking differently: Take 500 mg by mouth at bedtime.   Leigh Valery RAMAN, PA-C  Active Self, Pharmacy Records           Med Note (COFFELL, JON CHRISTELLA   Thu Dec 16, 2023  1:00 PM) [place] ; LF 10/28/23 #60.  metoprolol  succinate (TOPROL -XL) 25 MG 24 hr tablet [id number]  Take 25-50 mg by mouth See admin instructions. Take 1 tablet (25mg ) by mouth every evening. May increase to 2 tablets (50mg ) at bedtime if needed for elevated blood pressure or HR. [provider]  Active Self, Pharmacy Records           Med Note (COFFELL, JON CHRISTELLA   Thu Dec 16, 2023  1:24 PM) Patient confirms taking 2 strengths  of metoprolol  succinate. Usual directions of 50mg  (1/2 tablet of 100mg ) QAM + 25mg  QPM (but may increase to 50mg  QPM if needed)  metoprolol  succinate (TOPROL -XL) 25 MG 24 hr tablet [id number] Yes Take 1 tablet (25 mg total) by mouth daily. Vann, Jessica U, DO  Active   Multiple Vitamin (MULTIVITAMIN WITH MINERALS) TABS tablet [id number] Yes Take 1 tablet by mouth daily. [provider]  Active Self, Pharmacy Records  nitroGLYCERIN  (NITROSTAT ) 0.4 MG SL tablet [id number] Yes Place 0.4 mg under the tongue every 5 (five) minutes x 3 doses as needed for chest pain. [provider]  Active Self, Pharmacy Records           Med Note LAURALYN, TIFFANI S   Wed Feb 26, 2016  9:33 AM)    omeprazole (PRILOSEC) 20 MG capsule [id number] Yes Take 20 mg by mouth daily. [provider]  Active Self, Pharmacy Records           Med Note (COFFELL, JON CHRISTELLA   Thu Dec 16, 2023  1:10 PM)    oxyCODONE -acetaminophen  (PERCOCET) 10-325 MG tablet [id number] Yes Take 1 tablet by mouth every 8 (eight) hours as needed (severey pain). [provider]  Active Pharmacy Records, Self           Med Note (COFFELL, JON CHRISTELLA   Thu Dec 16, 2023  1:05 PM) [facility] 08[facility]10/23 #90.  polyethylene glycol (MIRALAX  / GLYCOLAX ) 17 g packet [id number] Yes Take 17 g by mouth daily as needed for mild constipation. Verdene Purchase, MD  Active Pharmacy Records, Self  potassium chloride  (K-DUR,KLOR-CON ) 10 MEQ tablet [id number]  Take 10 mEq by mouth daily. [provider]  Active Self, Pharmacy Records  primidone  (MYSOLINE ) 50 MG tablet [id number] Yes Take 150 mg by mouth daily.  Patient  taking differently: Take 25 mg by mouth daily.   [provider]  Active Self, Pharmacy Records           Med Note (COFFELL, JON HERO   Thu Dec 16, 2023  1:06 PM) [facility] 10/30/23 #270.  sacubitril -valsartan  (ENTRESTO ) 24-26 MG [id number] Yes Take 1 tablet by mouth 2 (two) times daily. [provider]  Active Pharmacy  Records, Self           Med Note (COFFELL, JON HERO   Thu Dec 16, 2023  1:07 PM) [facility] 09/17/23 #180.  Semaglutide , 2 MG/DOSE, (OZEMPIC , 2 MG/DOSE,) 8 MG/3ML SOPN [id number] Yes Inject 2 mg into the skin once a week   Active Self, Pharmacy Records  tamsulosin  (FLOMAX ) 0.4 MG CAPS capsule [id number] Yes Take 0.8 mg by mouth every evening. [provider]  Active Self, Pharmacy Records           Med Note (COFFELL, JON HERO   Thu Dec 16, 2023  1:07 PM) [facility] 11/12/23 #180.  traZODone  (DESYREL ) 150 MG tablet [id number] Yes Take 150 mg by mouth every evening. [provider]  Active Pharmacy Records, Self           Med Note (COFFELL, JON HERO   Thu Dec 16, 2023  1:08 PM) [facility]  [facility] 11/24/23 #90.  Vibegron (GEMTESA) 75 MG TABS [id number] Yes Take 75 mg by mouth daily. [provider]  Active Pharmacy Records, Self           Med Note (COFFELL, JON HERO   Thu Dec 16, 2023  1:09 PM) [facility] - next fill scheduled 02/01/24 #90.            Goals Addressed             This Visit's Progress    [facility] Transitions of Care (TOC) Care Plan       Problems:  Recent Hospitalization for treatment of Septic Shock Knowledge Deficit Related to Septic Shock/UTI and Pneumonia  Goal:  Over the next 30 days, the patient will not experience hospital readmission  Interventions:  Transitions of Care: 12/27/23 Patient reports doing good.  PCP appointment went well. No changes with medications.  He lives in the home with spouse who he states is his caregiver but states he is independent but does not drive. Wife takes to appointments.  Reiterated signs of infection with patient.  Several medications remain on hold, PCP did not start them back yet.      Doctor Visits  - discussed the importance of doctor visits Reviewed Signs and symptoms of infection Monitor for fever, chills, increased urination, burning on urination, blood in urine, nausea, vomiting, or pain in abdomen  Contact  physician if: You have a new or higher fever. You are coughing more deeply or more often. You are having increased shortness of breath.   Patient Self Care Activities:  Attend all scheduled provider appointments Call provider office for new concerns or questions  Notify RN Care Manager of TOC call rescheduling needs Participate in Transition of Care Program/Attend TOC scheduled calls Take medications as prescribed   Monitor for fever, chills., increased cough, increased shortness of breath, burning on urination, nausea, vomiting, or pain in abdomen  Plan:  The patient has been provided with contact information for the care management team and has been advised to call with any health related questions or concerns.  Recommendation:   Continue Current Plan of Care  Follow Up Plan:   Telephone follow-up in 1 week  Monte Bronder J. Danel Studzinski RN, MSN [facility], [facility] RN Care Manager Direct Dial: [phone]  Fax: [phone] Website: [email or web address]

## 2023-12-27 NOTE — Patient Instructions (Signed)
 Visit Information  Thank you for taking time to visit with me today. Please don't hesitate to contact me if I can be of assistance to you before our next scheduled telephone appointment.  Our next appointment is by telephone on 01/04/24 at 1100 am  Following is a copy of your care plan:   Goals Addressed             This Visit's Progress    [facility] Transitions of Care (TOC) Care Plan       Problems:  Recent Hospitalization for treatment of Septic Shock Knowledge Deficit Related to Septic Shock/UTI and Pneumonia  Goal:  Over the next 30 days, the patient will not experience hospital readmission  Interventions:  Transitions of Care: 12/27/23 Patient reports doing good.  PCP appointment went well. No changes with medications.  He lives in the home with spouse who he states is his caregiver but states he is independent but does not drive. Wife takes to appointments.  Reiterated signs of infection with patient.  Several medications remain on hold, PCP did not start them back yet.      Doctor Visits  - discussed the importance of doctor visits Reviewed Signs and symptoms of infection Monitor for fever, chills, increased urination, burning on urination, blood in urine, nausea, vomiting, or pain in abdomen  Contact physician if: You have a new or higher fever. You are coughing more deeply or more often. You are having increased shortness of breath.   Patient Self Care Activities:  Attend all scheduled provider appointments Call provider office for new concerns or questions  Notify RN Care Manager of TOC call rescheduling needs Participate in Transition of Care Program/Attend TOC scheduled calls Take medications as prescribed   Monitor for fever, chills., increased cough, increased shortness of breath, burning on urination, nausea, vomiting, or pain in abdomen  Plan:  The patient has been provided with contact information for the care management team and has been advised to call with  any health related questions or concerns.         Patient verbalizes understanding of instructions and care plan provided today and agrees to view in MyChart. Active MyChart status and patient understanding of how to access instructions and care plan via MyChart confirmed with patient.     The patient has been provided with contact information for the care management team and has been advised to call with any health related questions or concerns.   Please call the care guide team at [phone] if you need to cancel or reschedule your appointment.   Please call the Suicide and Crisis Lifeline: 988 if you are experiencing a Mental Health or Behavioral Health Crisis or need someone to talk to.  Deyon Chizek J. Aiva Miskell RN, MSN [facility], [facility] RN Care Manager Direct Dial: [phone]  Fax: [phone] Website: [email or web address]

## 2024-01-04 ENCOUNTER — Other Ambulatory Visit: Payer: Self-pay

## 2024-01-04 NOTE — Transitions of Care (Post Inpatient/ED Visit) (Signed)
 Transition of Care week 3  Visit Note  01/04/2024  Name: Johnny Ochoa. MRN: [id number]          DOB: 1945-12-07  Situation: Patient enrolled in [facility] 30-day program. Visit completed with patient by telephone.   Background:   Initial Transition Care Management Follow-up Telephone Call Discharge Date and Diagnosis: 12/17/23, Septic shock   Past Medical History:  Diagnosis Date   AICD (automatic cardioverter/defibrillator) present    Arthritis    Cancer (HCC)    prostate  (Radiation only)   Cellulitis    CHF (congestive heart failure) (HCC)    Complication of anesthesia    slow to come out of anesthesia   Coronary artery disease    Diabetes mellitus    Dyspnea    GERD (gastroesophageal reflux disease)    Gout    Hypertension    Kidney stones    Leg swelling    Low back pain    Myocardial infarction (HCC) 12/09/1993   Obese    OSA (obstructive sleep apnea)    Peripheral neuropathy    PTSD (post-traumatic stress disorder)     Assessment: Patient Reported Symptoms: Cognitive Cognitive Status: Alert and oriented to person, place, and time, Normal speech and language skills      Neurological Neurological Review of Symptoms: No symptoms reported    HEENT HEENT Symptoms Reported: No symptoms reported      Cardiovascular Cardiovascular Symptoms Reported: No symptoms reported Weight: 213 lb (96.6 kg) Cardiovascular Self-Management Outcome: 4 (good)  Respiratory Respiratory Symptoms Reported: No symptoms reported Additional Respiratory Details: Recent pneumonia.  Denies cough, fever or chills Respiratory Management Strategies: Medication therapy Respiratory Self-Management Outcome: 4 (good)  Endocrine Endocrine Symptoms Reported: No symptoms reported Is patient diabetic?: Yes Is patient checking blood sugars at home?: Yes List most recent blood sugar readings, include date and time of day: Blood sugar 111 today. 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 Self cath's about 1-2 per week per patient.   Patient reports not needing to cath Genitourinary Management Strategies: Catheter, straight Genitourinary Self-Management Outcome: 4 (good)  Integumentary Integumentary Symptoms Reported: No symptoms reported    Musculoskeletal Musculoskelatal Symptoms Reviewed: Unsteady gait Additional Musculoskeletal Details: Uses walker Musculoskeletal Management Strategies: Routine screening, Activity Musculoskeletal Self-Management Outcome: 4 (good)      Psychosocial Psychosocial Symptoms Reported: No symptoms reported         Today's Vitals   01/04/24 1114  Weight: 213 lb (96.6 kg)   Pain Scale: 0-10 Pain Score: 0-No pain  Medications Reviewed Today     Reviewed by Lovell Nuttall, RN (Case Manager) on 01/04/24 at 1111  Med List Status: <None>   Medication Order Taking? Sig Documenting Provider Last Dose Status Informant  acetaminophen  (TYLENOL ) 500 MG tablet [id number] Yes Take 1,000 mg by mouth 2 (two) times daily as needed for headache or fever (pain). [provider]  Active Pharmacy Records, Self  acidophilus (RISAQUAD) CAPS capsule [id number] Yes Take 1 capsule by mouth daily. Vann, Jessica U, DO  Active   amoxicillin -clavulanate (AUGMENTIN ) 875-125 MG tablet [id number]  Take 1 tablet by mouth every 12 (twelve) hours.  Patient not taking: Reported on 12/27/2023   Vann, Jessica U, DO  Active   ascorbic acid  (VITAMIN C) 500 MG tablet [id number] Yes Take 500 mg  by mouth 2 (two) times daily. [provider]  Active Self, Pharmacy Records  aspirin  EC 81 MG tablet [id number] Yes Take 81 mg by mouth daily. [provider]  Active Pharmacy Records, Self  atorvastatin  (LIPITOR) 80 MG  tablet [id number] Yes Take 80 mg by mouth every evening. [provider]  Active Self, Pharmacy Records           Med Note (COFFELL, JON CHRISTELLA Schaumann Dec 16, 2023 12:53 PM) [facility] #90.  Cholecalciferol  (VITAMIN D3) 50 MCG (2000 UT) TABS [id number] Yes Take 2,000 Units by mouth daily. [provider]  Active Pharmacy Records, Self  clopidogrel  (PLAVIX ) 75 MG tablet [id number] Yes Take 1 tablet (75 mg total) by mouth daily. Please hold for now.  You can resume on 08/03/2022 if you do not have any further episodes of bleeding. Verdene Purchase, MD  Active Pharmacy Records, Self           Med Note (COFFELL, JON CHRISTELLA   Thu Dec 16, 2023 12:53 PM) [facility] 10[facility] #90.  digoxin  (LANOXIN ) 0.125 MG tablet [id number] Yes Take 125 mcg by mouth daily. [provider]  Active Pharmacy Records, Self           Med Note (COFFELL, JON CHRISTELLA Schaumann Dec 16, 2023 12:54 PM) [facility] : [facility] - next fill scheduled 01/01/24 #90.  docusate sodium  (COLACE) 100 MG capsule [id number] Yes Take 100 mg by mouth 2 (two) times daily. [provider]  Active Pharmacy Records, Self  DULoxetine  (CYMBALTA ) 30 MG capsule [id number] Yes Take 90 mg by mouth daily. [provider]  Active Pharmacy Records, Self           Med Note (COFFELL, JON CHRISTELLA Schaumann Dec 16, 2023 12:55 PM) [facility] 10/06/23 #270.  empagliflozin  (JARDIANCE ) 25 MG TABS tablet [id number]  Take 25 mg by mouth daily. [provider]  Active Self, Pharmacy Records           Med Note (COFFELL, JON CHRISTELLA Schaumann Dec 16, 2023 12:54 PM) [facility] 10/31/23 #90.  ezetimibe  (ZETIA ) 10 MG tablet [id number] Yes Take 10 mg by mouth daily. [provider]  Active Self, Pharmacy Records           Med Note (COFFELL, JON CHRISTELLA   Thu Dec 16, 2023 12:55 PM) [facility] 11/07/23 #90.  Ferrous Sulfate (IRON) 325 (65 Fe) MG TABS [id number] Yes Take 325 mg by mouth daily. [provider]  Active Self, Pharmacy Records  fluticasone-salmeterol  (ADVAIR DISKUS) 100-50 MCG/ACT AEPB [id number] Yes Inhale 1 puff into the lungs 2 (two) times daily as needed (wheezing, shortness of breath). [provider]  Active Pharmacy Records, Self           Med Note (COFFELL, JON CHRISTELLA Schaumann Dec 16, 2023 12:59 PM) [facility] 08[facility]10/23 #3 inhalers.  furosemide  (LASIX ) 40 MG tablet [id number]  Take 1 tablet (40 mg total) by mouth daily. Take an extra dose if you gain 2 lbs in 24 hours.  Patient taking differently: Take 40 mg by mouth See admin instructions. Take 1 tablet (40mg ) by mouth twice daily. Take an additional 1 tablet in the morning as needed for weight gain > 2lbs in 24 hours.   Rama, Tawni SQUIBB, MD  Active Self, Pharmacy Records           Med Note (COFFELL, [facility] M  Thu Dec 16, 2023 12:58 PM) [facility] 11/22/23 #180.  gabapentin  (NEURONTIN ) 400 MG capsule [id number] Yes Take 400 mg by mouth 3 (three) times daily.  [provider]  Active Self, Pharmacy Records           Med Note (COFFELL, JON HERO   Thu Dec 16, 2023 12:58 PM) [facility]  : [facility] 10/31/23 #270.  isosorbide  mononitrate (IMDUR ) 30 MG 24 hr tablet [id number]  Take 90 mg by mouth daily. [provider]  Active Pharmacy Records, Self           Med Note (COFFELL, JON HERO Schaumann Dec 16, 2023 12:59 PM) [facility] :  [facility] 11/22/23 #270.  Magnesium  400 MG TABS [id number] Yes Take 400 mg by mouth daily. [provider]  Active Self, Pharmacy Records           Med Note LUCIOUS JULIAN   Dju Jul 20, 2020  9:49 AM)    methocarbamol  (ROBAXIN ) 500 MG tablet [id number] Yes Take 1 tablet (500 mg total) by mouth every 6 (six) hours as needed for muscle spasms.  Patient taking differently: Take 500 mg by mouth at bedtime.   Leigh Valery RAMAN, PA-C  Active Self, Pharmacy Records           Med Note (COFFELL, JON HERO   Thu Dec 16, 2023  1:00 PM) [place] ; LF 10/28/23 #60.  metoprolol  succinate (TOPROL -XL) 25 MG 24 hr tablet [id number]  Take 25-50 mg by mouth See admin instructions. Take 1 tablet (25mg ) by mouth  every evening. May increase to 2 tablets (50mg ) at bedtime if needed for elevated blood pressure or HR. [provider]  Active Self, Pharmacy Records           Med Note (COFFELL, JON HERO   Thu Dec 16, 2023  1:24 PM) Patient confirms taking 2 strengths of metoprolol  succinate. Usual directions of 50mg  (1/2 tablet of 100mg ) QAM + 25mg  QPM (but may increase to 50mg  QPM if needed)  metoprolol  succinate (TOPROL -XL) 25 MG 24 hr tablet [id number] Yes Take 1 tablet (25 mg total) by mouth daily. Vann, Jessica U, DO  Active   Multiple Vitamin (MULTIVITAMIN WITH MINERALS) TABS tablet [id number] Yes Take 1 tablet by mouth daily. [provider]  Active Self, Pharmacy Records  nitroGLYCERIN  (NITROSTAT ) 0.4 MG SL tablet [id number] Yes Place 0.4 mg under the tongue every 5 (five) minutes x 3 doses as needed for chest pain. [provider]  Active Self, Pharmacy Records           Med Note LAURALYN, TIFFANI S   Wed Feb 26, 2016  9:33 AM)    omeprazole (PRILOSEC) 20 MG capsule [id number] Yes Take 20 mg by mouth daily. [provider]  Active Self, Pharmacy Records           Med Note (COFFELL, JON HERO   Thu Dec 16, 2023  1:10 PM)    oxyCODONE -acetaminophen  (PERCOCET) 10-325 MG tablet [id number] Yes Take 1 tablet by mouth every 8 (eight) hours as needed (severey pain). [provider]  Active Pharmacy Records, Self           Med Note (COFFELL, JON HERO   Thu Dec 16, 2023  1:05 PM) [facility] 08/10/23 #90.  polyethylene glycol (MIRALAX  / GLYCOLAX ) 17 g packet [id number] Yes Take 17 g by mouth daily as needed for mild constipation. Verdene Purchase, MD  Active Pharmacy Records, Self  potassium chloride  (K-DUR,KLOR-CON ) 10 MEQ  tablet [id number]  Take 10 mEq by mouth daily. [provider]  Active Self, Pharmacy Records  primidone  (MYSOLINE ) 50 MG tablet [id number] Yes Take 150 mg by mouth daily.  Patient taking differently: Take 25 mg by mouth daily.   [provider]   Active Self, Pharmacy Records           Med Note (COFFELL, JON HERO   Thu Dec 16, 2023  1:06 PM) [facility] 10/30/23 #270.  sacubitril -valsartan  (ENTRESTO ) 24-26 MG [id number] Yes Take 1 tablet by mouth 2 (two) times daily. [provider]  Active Pharmacy Records, Self           Med Note (COFFELL, JON HERO   Thu Dec 16, 2023  1:07 PM) [facility] 09/17/23 #180.  Semaglutide , 2 MG/DOSE, (OZEMPIC , 2 MG/DOSE,) 8 MG/3ML SOPN [id number] Yes Inject 2 mg into the skin once a week   Active Self, Pharmacy Records  tamsulosin  (FLOMAX ) 0.4 MG CAPS capsule [id number] Yes Take 0.8 mg by mouth every evening. [provider]  Active Self, Pharmacy Records           Med Note (COFFELL, JON HERO   Thu Dec 16, 2023  1:07 PM) [facility] 11/12/23 #180.  traZODone  (DESYREL ) 150 MG tablet [id number] Yes Take 150 mg by mouth every evening. [provider]  Active Pharmacy Records, Self           Med Note (COFFELL, JON HERO   Thu Dec 16, 2023  1:08 PM) [facility] :  [facility] 11/24/23 #90.  Vibegron (GEMTESA) 75 MG TABS [id number] Yes Take 75 mg by mouth daily. [provider]  Active Pharmacy Records, Self           Med Note (COFFELL, JON HERO   Thu Dec 16, 2023  1:09 PM) [facility] : [facility] - next fill scheduled 02/01/24 #90.            Goals Addressed             This Visit's Progress    [facility] Transitions of Care (TOC) Care Plan       Problems:  Recent Hospitalization for treatment of Septic Shock Knowledge Deficit Related to Septic Shock/UTI and Pneumonia  Goal:  Over the next 30 days, the patient will not experience hospital readmission  Interventions:  Transitions of Care: 01/04/24 Patient reports doing good.  Reviewed signs of infection with patient.  He denies symptoms.   Several medications remain on hold for now. Weight 212-213 lbs.  Patient reports he has not needed to take prn lasix .  Blood sugar 111 today.        Doctor Visits  - discussed the importance of doctor visits Reviewed Signs and  symptoms of infection Monitor for fever, chills, increased urination, burning on urination, blood in urine, nausea, vomiting, or pain in abdomen  Contact physician if: You have a new or higher fever. You are coughing more deeply or more often. You are having increased shortness of breath.   Patient Self Care Activities:  Attend all scheduled provider appointments Call provider office for new concerns or questions  Notify RN Care Manager of TOC call rescheduling needs Participate in Transition of Care Program/Attend TOC scheduled calls Take medications as prescribed   Monitor for fever, chills., increased cough, increased shortness of breath, burning on urination, nausea, vomiting, or pain in abdomen  Plan:  The patient has been provided with contact information for the care management team and has been  advised to call with any health related questions or concerns.         Recommendation:   Continue Current Plan of Care  Follow Up Plan:   Telephone follow-up 2 weeks  Shiquan Mathieu J. Anaeli Cornwall RN, MSN [facility], [facility] RN Care Manager Direct Dial: [phone]  Fax: [phone] Website: [email or web address]

## 2024-01-04 NOTE — Patient Instructions (Signed)
 Visit Information  Thank you for taking time to visit with me today. Please don't hesitate to contact me if I can be of assistance to you before our next scheduled telephone appointment.  Our next appointment is by telephone on 01/18/24 at 1100 am  Following is a copy of your care plan:   Goals Addressed             This Visit's Progress    [facility] Transitions of Care (TOC) Care Plan       Problems:  Recent Hospitalization for treatment of Septic Shock Knowledge Deficit Related to Septic Shock/UTI and Pneumonia  Goal:  Over the next 30 days, the patient will not experience hospital readmission  Interventions:  Transitions of Care: 01/04/24 Patient reports doing good.  Reviewed signs of infection with patient.  He denies symptoms.   Several medications remain on hold for now. Weight 212-213 lbs.  Patient reports he has not needed to take prn lasix .  Blood sugar 111 today.        Doctor Visits  - discussed the importance of doctor visits Reviewed Signs and symptoms of infection Monitor for fever, chills, increased urination, burning on urination, blood in urine, nausea, vomiting, or pain in abdomen  Contact physician if: You have a new or higher fever. You are coughing more deeply or more often. You are having increased shortness of breath.   Patient Self Care Activities:  Attend all scheduled provider appointments Call provider office for new concerns or questions  Notify RN Care Manager of TOC call rescheduling needs Participate in Transition of Care Program/Attend TOC scheduled calls Take medications as prescribed   Monitor for fever, chills., increased cough, increased shortness of breath, burning on urination, nausea, vomiting, or pain in abdomen  Plan:  The patient has been provided with contact information for the care management team and has been advised to call with any health related questions or concerns.         Patient verbalizes understanding of instructions  and care plan provided today and agrees to view in MyChart. Active MyChart status and patient understanding of how to access instructions and care plan via MyChart confirmed with patient.     The patient has been provided with contact information for the care management team and has been advised to call with any health related questions or concerns.   Please call the care guide team at [phone] if you need to cancel or reschedule your appointment.   Please call the Suicide and Crisis Lifeline: 988 if you are experiencing a Mental Health or Behavioral Health Crisis or need someone to talk to.  Stanely Sexson J. Cypress Hinkson RN, MSN [facility], [facility] RN Care Manager Direct Dial: [phone]  Fax: [phone] Website: [email or web address]

## 2024-01-18 ENCOUNTER — Other Ambulatory Visit: Admitting: *Deleted

## 2024-01-18 NOTE — Transitions of Care (Post Inpatient/ED Visit) (Signed)
 Transition of Care week 3  Visit Note  01/18/2024  Name: Johnny Ochoa. MRN: [id number]          DOB: 11-29-1945  Situation: Patient enrolled in [facility] 30-day program. Visit completed with Mr. Cudmore by telephone.   Background:   Initial Transition Care Management Follow-up Telephone Call Discharge Date and Diagnosis: 12/17/23, Septic shock   Past Medical History:  Diagnosis Date   AICD (automatic cardioverter/defibrillator) present    Arthritis    Cancer (HCC)    prostate  (Radiation only)   Cellulitis    CHF (congestive heart failure) (HCC)    Complication of anesthesia    slow to come out of anesthesia   Coronary artery disease    Diabetes mellitus    Dyspnea    GERD (gastroesophageal reflux disease)    Gout    Hypertension    Kidney stones    Leg swelling    Low back pain    Myocardial infarction (HCC) 12/09/1993   Obese    OSA (obstructive sleep apnea)    Peripheral neuropathy    PTSD (post-traumatic stress disorder)     Assessment: Patient Reported Symptoms: Cognitive Cognitive Status: Able to follow simple commands, Alert and oriented to person, place, and time, Normal speech and language skills      Neurological Neurological Review of Symptoms: No symptoms reported    HEENT HEENT Symptoms Reported: No symptoms reported      Cardiovascular Cardiovascular Symptoms Reported: No symptoms reported Weight: 212 lb (96.2 kg) Cardiovascular Self-Management Outcome: 4 (good) Cardiovascular Comment: RNCM advised patient to check BP, record readings and report abnormal readings  Respiratory Respiratory Symptoms Reported: Productive cough Additional Respiratory Details: Recent pneumonia, patient reports ongoing productive cough. Denies fever Respiratory Management Strategies: Routine screening Respiratory Self-Management Outcome: 4 (good)  Endocrine Endocrine Symptoms Reported: No symptoms reported Is patient diabetic?: Yes Is patient checking blood sugars at  home?: Yes List most recent blood sugar readings, include date and time of day: BS 186 currently, just finished a meal Endocrine Self-Management Outcome: 4 (good)  Gastrointestinal Gastrointestinal Symptoms Reported: No symptoms reported      Genitourinary Other Genitourinary Symptoms: Self Cath twice weekly Additional Genitourinary Details: Denies fever or difficulty with urination Genitourinary Self-Management Outcome: 4 (good)  Integumentary Integumentary Symptoms Reported: No symptoms reported    Musculoskeletal Musculoskelatal Symptoms Reviewed: Not assessed        Psychosocial Psychosocial Symptoms Reported: Not assessed         Today's Vitals   01/18/24 1046  Weight: 212 lb (96.2 kg)   Pain Score: 8  Pain Type: Chronic pain Pain Location: Back Pain Intervention(s):  (back brace, tylenol  or oxycodone  as needed)  Medications Reviewed Today     Reviewed by Lucky Andrea LABOR, RN (Registered Nurse) on 01/18/24 at 1058  Med List Status: <None>   Medication Order Taking? Sig Documenting Provider Last Dose Status Informant  acetaminophen  (TYLENOL ) 500 MG tablet [id number] Yes Take 1,000 mg by mouth 2 (two) times daily as needed for headache or fever (pain). [provider]  Active Pharmacy Records, Self  acidophilus (RISAQUAD) CAPS capsule [id number]  Take 1 capsule by mouth daily.  Patient not taking: Reported on 01/18/2024   Vann, Jessica U, DO  Active   amoxicillin -clavulanate (AUGMENTIN ) 875-125 MG tablet [id number]  Take 1 tablet by mouth every 12 (twelve) hours.  Patient not taking: Reported on 01/18/2024   Vann, Jessica U, DO  Active   ascorbic acid  (VITAMIN C) 500  MG tablet [id number] Yes Take 500 mg by mouth 2 (two) times daily. [provider]  Active Self, Pharmacy Records  aspirin  EC 81 MG tablet [id number] Yes Take 81 mg by mouth daily. [provider]  Active Pharmacy Records, Self  atorvastatin  (LIPITOR) 80 MG tablet [id number] Yes Take 80 mg  by mouth every evening. [provider]  Active Self, Pharmacy Records           Med Note (COFFELL, JON HERO   Thu Dec 16, 2023 12:53 PM) [facility]/23 #90.  Cholecalciferol  (VITAMIN D3) 50 MCG (2000 UT) TABS [id number] Yes Take 2,000 Units by mouth daily. [provider]  Active Pharmacy Records, Self  clopidogrel  (PLAVIX ) 75 MG tablet [id number] Yes Take 1 tablet (75 mg total) by mouth daily. Please hold for now.  You can resume on 08/03/2022 if you do not have any further episodes of bleeding. Verdene Purchase, MD  Active Pharmacy Records, Self           Med Note (COFFELL, JON HERO   Thu Dec 16, 2023 12:53 PM) [facility] 10[facility] #90.  digoxin  (LANOXIN ) 0.125 MG tablet [id number] Yes Take 125 mcg by mouth daily. [provider]  Active Pharmacy Records, Self           Med Note (COFFELL, JON HERO Schaumann Dec 16, 2023 12:54 PM) [facility] - next fill scheduled 01/01/24 #90.  docusate sodium  (COLACE) 100 MG capsule [id number] Yes Take 100 mg by mouth 2 (two) times daily. [provider]  Active Pharmacy Records, Self  DULoxetine  (CYMBALTA ) 30 MG capsule [id number] Yes Take 90 mg by mouth daily. [provider]  Active Pharmacy Records, Self           Med Note (COFFELL, JON HERO Schaumann Dec 16, 2023 12:55 PM) [facility] 10/06/23 #270.  empagliflozin  (JARDIANCE ) 25 MG TABS tablet [id number]  Take 25 mg by mouth daily.  Patient not taking: Reported on 01/18/2024   [provider]  Active Self, Pharmacy Records           Med Note (COFFELL, JON HERO Schaumann Dec 16, 2023 12:54 PM) [facility] 10/31/23 #90.  ezetimibe  (ZETIA ) 10 MG tablet [id number] Yes Take 10 mg by mouth daily. [provider]  Active Self, Pharmacy Records           Med Note (COFFELL, JON HERO   Thu Dec 16, 2023 12:55 PM) [facility] 11/07/23 #90.  Ferrous Sulfate (IRON) 325 (65 Fe) MG TABS [id number] Yes Take 325 mg by mouth daily. [provider]  Active Self, Pharmacy Records   fluticasone-salmeterol (ADVAIR DISKUS) 100-50 MCG/ACT AEPB [id number] Yes Inhale 1 puff into the lungs 2 (two) times daily as needed (wheezing, shortness of breath). [provider]  Active Pharmacy Records, Self           Med Note (COFFELL, JON HERO Schaumann Dec 16, 2023 12:59 PM) [facility] 08/10/23 #3 inhalers.  furosemide  (LASIX ) 40 MG tablet [id number]  Take 1 tablet (40 mg total) by mouth daily. Take an extra dose if you gain 2 lbs in 24 hours.  Patient not taking: Reported on 01/18/2024   Rama, Tawni SQUIBB, MD  Active Self, Pharmacy Records           Med Note [facility], JON HERO Schaumann Dec 16, 2023 12:58 PM) [facility] 11/22/23 #180.  gabapentin  (NEURONTIN )  400 MG capsule [id number] Yes Take 400 mg by mouth 3 (three) times daily.  [provider]  Active Self, Pharmacy Records           Med Note (COFFELL, JON CHRISTELLA Schaumann Dec 16, 2023 12:58 PM) [facility]  : [facility] 10/31/23 #270.  isosorbide  mononitrate (IMDUR ) 30 MG 24 hr tablet [id number]  Take 90 mg by mouth daily.  Patient not taking: Reported on 01/18/2024   [provider]  Active Pharmacy Records, Self           Med Note (COFFELL, JON CHRISTELLA Schaumann Dec 16, 2023 12:59 PM) [facility] :  [facility] 11/22/23 #270.  Magnesium  400 MG TABS [id number] Yes Take 400 mg by mouth daily. [provider]  Active Self, Pharmacy Records           Med Note LUCIOUS JULIAN   Dju Jul 20, 2020  9:49 AM)    methocarbamol  (ROBAXIN ) 500 MG tablet [id number] Yes Take 1 tablet (500 mg total) by mouth every 6 (six) hours as needed for muscle spasms. Leigh Valery RAMAN, PA-C  Active Self, Pharmacy Records           Med Note (COFFELL, JON CHRISTELLA   Thu Dec 16, 2023  1:00 PM) [place] ; LF 10/28/23 #60.  metoprolol  succinate (TOPROL -XL) 25 MG 24 hr tablet [id number]  Take 25-50 mg by mouth See admin instructions. Take 1 tablet (25mg ) by mouth every evening. May increase to 2 tablets (50mg ) at bedtime if needed for elevated blood pressure or HR.  Patient not taking: Reported on 01/18/2024    [provider]  Active Self, Pharmacy Records           Med Note (COFFELL, JON CHRISTELLA   Thu Dec 16, 2023  1:24 PM) Patient confirms taking 2 strengths of metoprolol  succinate. Usual directions of 50mg  (1/2 tablet of 100mg ) QAM + 25mg  QPM (but may increase to 50mg  QPM if needed)  metoprolol  succinate (TOPROL -XL) 25 MG 24 hr tablet [id number]  Take 1 tablet (25 mg total) by mouth daily.  Patient not taking: Reported on 01/18/2024   Vann, Jessica U, DO  Active   Multiple Vitamin (MULTIVITAMIN WITH MINERALS) TABS tablet [id number] Yes Take 1 tablet by mouth daily. [provider]  Active Self, Pharmacy Records  nitroGLYCERIN  (NITROSTAT ) 0.4 MG SL tablet [id number] Yes Place 0.4 mg under the tongue every 5 (five) minutes x 3 doses as needed for chest pain. [provider]  Active Self, Pharmacy Records           Med Note LAURALYN, TIFFANI S   Wed Feb 26, 2016  9:33 AM)    omeprazole (PRILOSEC) 20 MG capsule [id number] Yes Take 20 mg by mouth daily. [provider]  Active Self, Pharmacy Records           Med Note (COFFELL, JON CHRISTELLA   Thu Dec 16, 2023  1:10 PM)    oxyCODONE -acetaminophen  (PERCOCET) 10-325 MG tablet [id number] Yes Take 1 tablet by mouth every 8 (eight) hours as needed (severey pain). [provider]  Active Pharmacy Records, Self           Med Note (COFFELL, JON CHRISTELLA   Thu Dec 16, 2023  1:05 PM) [facility] 08/10/23 #90.  polyethylene glycol (MIRALAX  / GLYCOLAX ) 17 g packet [id number] Yes Take 17 g by mouth daily as needed for mild constipation. Verdene Purchase, MD  Active Pharmacy Records, Self  potassium chloride  (K-DUR,KLOR-CON ) 10 MEQ  tablet [id number]  Take 10 mEq by mouth daily.  Patient not taking: Reported on 01/18/2024   [provider]  Active Self, Pharmacy Records  primidone  (MYSOLINE ) 50 MG tablet [id number] Yes Take 150 mg by mouth daily.  Patient taking differently: Take 50 mg by mouth daily.   [provider]  Active Self,  Pharmacy Records           Med Note (COFFELL, JON HERO   Thu Dec 16, 2023  1:06 PM) [facility] 10/30/23 #270.  sacubitril -valsartan  (ENTRESTO ) 24-26 MG [id number] Yes Take 1 tablet by mouth 2 (two) times daily. [provider]  Active Pharmacy Records, Self           Med Note (COFFELL, JON HERO   Thu Dec 16, 2023  1:07 PM) [facility] 09/17/23 #180.  Semaglutide , 2 MG/DOSE, (OZEMPIC , 2 MG/DOSE,) 8 MG/3ML SOPN [id number] Yes Inject 2 mg into the skin once a week   Active Self, Pharmacy Records  tamsulosin  (FLOMAX ) 0.4 MG CAPS capsule [id number] Yes Take 0.8 mg by mouth every evening. [provider]  Active Self, Pharmacy Records           Med Note (COFFELL, JON HERO   Thu Dec 16, 2023  1:07 PM) [facility] 11/12/23 #180.  traZODone  (DESYREL ) 150 MG tablet [id number] Yes Take 150 mg by mouth every evening. [provider]  Active Pharmacy Records, Self           Med Note (COFFELL, JON HERO   Thu Dec 16, 2023  1:08 PM) [facility] :  [facility] 11/24/23 #90.  Vibegron (GEMTESA) 75 MG TABS [id number] Yes Take 75 mg by mouth daily. [provider]  Active Pharmacy Records, Self           Med Note (COFFELL, JON HERO   Thu Dec 16, 2023  1:09 PM) [facility] - next fill scheduled 02/01/24 #90.            Recommendation:   Continue Current Plan of Care  Follow Up Plan:   Telephone follow-up in 1 week  Andrea Dimes RN, BSN   [facility] RN Care Manager [phone]

## 2024-01-27 ENCOUNTER — Other Ambulatory Visit: Payer: Self-pay

## 2024-01-27 NOTE — Patient Instructions (Signed)
 Visit Information  Thank you for taking time to visit with me today. Please don't hesitate to contact me if I can be of assistance to you.   Following is a copy of your care plan:   Goals Addressed             This Visit's Progress    COMPLETED: [facility] Transitions of Care (TOC) Care Plan       Problems:  Recent Hospitalization for treatment of Septic Shock Knowledge Deficit Related to Septic Shock/UTI and Pneumonia  Goal:  Over the next 30 days, the patient will not experience hospital readmission  Interventions:  Transitions of Care: 01/27/24 Patient reports doing good.  Several medications remain on hold for now. Weight 210 lbs.  Blood sugar 227.        Doctor Visits  - discussed the importance of doctor visits Reviewed Signs and symptoms of infection Monitor for fever, chills, increased urination, burning on urination, blood in urine, nausea, vomiting, or pain in abdomen  Contact physician if: You have a new or higher fever. You are coughing more deeply or more often. You are having increased shortness of breath.   Patient Self Care Activities:  Attend all scheduled provider appointments Call provider office for new concerns or questions  Notify RN Care Manager of TOC call rescheduling needs Participate in Transition of Care Program/Attend TOC scheduled calls Take medications as prescribed   Monitor for fever, chills., increased cough, increased shortness of breath, burning on urination, nausea, vomiting, or pain in abdomen  Plan:  The patient has been provided with contact information for the care management team and has been advised to call with any health related questions or concerns.  Patient has met goal of TOC program. Closing to TOC.          Patient verbalizes understanding of instructions and care plan provided today and agrees to view in MyChart. Active MyChart status and patient understanding of how to access instructions and care plan via MyChart  confirmed with patient.     The patient has been provided with contact information for the care management team and has been advised to call with any health related questions or concerns.   Please call the care guide team at [phone] if you need to cancel or reschedule your appointment.   Please call the Suicide and Crisis Lifeline: 988 if you are experiencing a Mental Health or Behavioral Health Crisis or need someone to talk to.  Taron Conrey J. Josephanthony Tindel RN, MSN [facility] RN Care Manager Direct Dial: [phone]  Fax: [phone] Website: [email or web address]

## 2024-01-27 NOTE — Transitions of Care (Post Inpatient/ED Visit) (Signed)
 Transition of Care Week 5  Visit Note  01/27/2024  Name: Johnny Ochoa. MRN: [id number]          DOB: [birth year]  Situation: Patient enrolled in [facility] 30-day program. Visit completed with patient Johnny Ochoa by telephone.   Background:   Initial Transition Care Management Follow-up Telephone Call Discharge Date and Diagnosis: No data recorded   Past Medical History:  Diagnosis Date   AICD (automatic cardioverter/defibrillator) present    Arthritis    Cancer (HCC)    prostate  (Radiation only)   Cellulitis    CHF (congestive heart failure) (HCC)    Complication of anesthesia    slow to come out of anesthesia   Coronary artery disease    Diabetes mellitus    Dyspnea    GERD (gastroesophageal reflux disease)    Gout    Hypertension    Kidney stones    Leg swelling    Low back pain    Myocardial infarction (HCC) 12/09/1993   Obese    OSA (obstructive sleep apnea)    Peripheral neuropathy    PTSD (post-traumatic stress disorder)     Assessment: Patient Reported Symptoms: Cognitive Cognitive Status: Alert and oriented to person, place, and time, Normal speech and language skills      Neurological Neurological Review of Symptoms: No symptoms reported    HEENT HEENT Symptoms Reported: No symptoms reported      Cardiovascular Cardiovascular Symptoms Reported: No symptoms reported Weight: 210 lb (95.3 kg) Cardiovascular Self-Management Outcome: 4 (good)  Respiratory Respiratory Symptoms Reported: Productive cough Additional Respiratory Details: Ongoing productive cough. Respiratory Management Strategies: Routine screening Respiratory Self-Management Outcome: 4 (good)  Endocrine Endocrine Symptoms Reported: Hypoglycemia Is patient diabetic?: Yes Is patient checking blood sugars at home?: Yes List most recent blood sugar readings, include date and time of day: Blood sugar 227 just ate breakfast.  He reports some low sugars in the overnight.  Discussed eating a snack  prior to bed such as graham crackers or peanut butter crackers. Endocrine Self-Management Outcome: 4 (good)  Gastrointestinal Gastrointestinal Symptoms Reported: No symptoms reported      Genitourinary Genitourinary Symptoms Reported: Other Other Genitourinary Symptoms: Self-caths a couple of times per week.  Denies any symptoms of UTI    Integumentary Integumentary Symptoms Reported: No symptoms reported    Musculoskeletal Musculoskelatal Symptoms Reviewed: Unsteady gait Additional Musculoskeletal Details: uses walker        Psychosocial Psychosocial Symptoms Reported: No symptoms reported         Today's Vitals   01/27/24 1136  Weight: 210 lb (95.3 kg)   Pain Scale: 0-10 Pain Score: 0-No pain  Medications Reviewed Today     Reviewed by Kristyanna Barcelo, RN (Case Manager) on 01/27/24 at 1124  Med List Status: <None>   Medication Order Taking? Sig Documenting Provider Last Dose Status Informant  acetaminophen  (TYLENOL ) 500 MG tablet [id number] Yes Take 1,000 mg by mouth 2 (two) times daily as needed for headache or fever (pain). [provider]  Active Pharmacy Records, Self  acidophilus (RISAQUAD) CAPS capsule [id number] Yes Take 1 capsule by mouth daily. Vann, Jessica U, DO  Active   amoxicillin -clavulanate (AUGMENTIN ) 875-125 MG tablet [id number] Yes Take 1 tablet by mouth every 12 (twelve) hours. Vann, Jessica U, DO  Active   ascorbic acid  (VITAMIN C) 500 MG tablet [id number] Yes Take 500 mg by mouth 2 (two) times daily. [provider]  Active Self, Pharmacy Records  aspirin  EC 81 MG tablet [id number]  Yes Take 81 mg by mouth daily. [provider]  Active Pharmacy Records, Self  atorvastatin  (LIPITOR) 80 MG tablet [id number] Yes Take 80 mg by mouth every evening. [provider]  Active Self, Pharmacy Records           Med Note (COFFELL, JON CHRISTELLA Schaumann Dec 16, 2023 12:53 PM) [facility]90.  Cholecalciferol  (VITAMIN D3) 50 MCG (2000 UT)  TABS [id number] Yes Take 2,000 Units by mouth daily. [provider]  Active Pharmacy Records, Self  clopidogrel  (PLAVIX ) 75 MG tablet [id number] Yes Take 1 tablet (75 mg total) by mouth daily. Please hold for now.  You can resume on 08/03/2022 if you do not have any further episodes of bleeding. Verdene Purchase, MD  Active Pharmacy Records, Self           Med Note (COFFELL, JON CHRISTELLA   Thu Dec 16, 2023 12:53 PM) [facility] 10[facility] #90.  digoxin  (LANOXIN ) 0.125 MG tablet [id number] Yes Take 125 mcg by mouth daily. [provider]  Active Pharmacy Records, Self           Med Note (COFFELL, JON CHRISTELLA Schaumann Dec 16, 2023 12:54 PM) [facility] - next fill scheduled 01/01/24 #90.  docusate sodium  (COLACE) 100 MG capsule [id number] Yes Take 100 mg by mouth 2 (two) times daily. [provider]  Active Pharmacy Records, Self  DULoxetine  (CYMBALTA ) 30 MG capsule [id number] Yes Take 90 mg by mouth daily. [provider]  Active Pharmacy Records, Self           Med Note (COFFELL, JON CHRISTELLA Schaumann Dec 16, 2023 12:55 PM) [facility] 10[facility]06/[facility] #270.  empagliflozin  (JARDIANCE ) 25 MG TABS tablet [id number]  Take 25 mg by mouth daily.  Patient not taking: Reported on 01/18/2024   [provider]  Active Self, Pharmacy Records           Med Note (COFFELL, JON CHRISTELLA Schaumann Dec 16, 2023 12:54 PM) [facility] 10/[facility] #90.  ezetimibe  (ZETIA ) 10 MG tablet [id number] Yes Take 10 mg by mouth daily. [provider]  Active Self, Pharmacy Records           Med Note (COFFELL, JON CHRISTELLA   Thu Dec 16, 2023 12:55 PM) [facility] 11[facility]07/[facility] #90.  Ferrous Sulfate (IRON) 325 (65 Fe) MG TABS [id number] Yes Take 325 mg by mouth daily. [provider]  Active Self, Pharmacy Records  fluticasone-salmeterol (ADVAIR DISKUS) 100-50 MCG/ACT AEPB [id number] Yes Inhale 1 puff into the lungs 2 (two) times daily as needed (wheezing, shortness of breath). [provider]  Active Pharmacy Records, Self            Med Note (COFFELL, JON CHRISTELLA Schaumann Dec 16, 2023 12:59 PM) [facility] 08/10/23 #3 inhalers.  furosemide  (LASIX ) 40 MG tablet [id number]  Take 1 tablet (40 mg total) by mouth daily. Take an extra dose if you gain 2 lbs in 24 hours.  Patient not taking: Reported on 01/18/2024   Rama, Tawni SQUIBB, MD  Active Self, Pharmacy Records           Med Note [facility], JON CHRISTELLA Schaumann Dec 16, 2023 12:58 PM) [facility] #180.  gabapentin  (NEURONTIN ) 400 MG capsule [id number] Yes Take 400 mg by mouth 3 (three) times daily.  [provider]  Active Self, Pharmacy Records  Med Note (COFFELL, JON HERO   Thu Dec 16, 2023 12:58 PM) [facility]  : [facility] 10/[facility]/23 #270.  isosorbide  mononitrate (IMDUR ) 30 MG 24 hr tablet [id number]  Take 90 mg by mouth daily.  Patient not taking: Reported on 01/18/2024   [provider]  Active Pharmacy Records, Self           Med Note (COFFELL, JON HERO Schaumann Dec 16, 2023 12:59 PM) [facility]  [facility] 11/22/23 #270.  Magnesium  400 MG TABS [id number] Yes Take 400 mg by mouth daily. [provider]  Active Self, Pharmacy Records           Med Note LUCIOUS JULIAN   Dju Jul 20, 2020  9:49 AM)    methocarbamol  (ROBAXIN ) 500 MG tablet [id number] Yes Take 1 tablet (500 mg total) by mouth every 6 (six) hours as needed for muscle spasms. Leigh Valery RAMAN, PA-C  Active Self, Pharmacy Records           Med Note (COFFELL, JON HERO   Thu Dec 16, 2023  1:00 PM) [place] ; LF 10/28/23 #60.  metoprolol  succinate (TOPROL -XL) 25 MG 24 hr tablet [id number]  Take 25-50 mg by mouth See admin instructions. Take 1 tablet (25mg ) by mouth every evening. May increase to 2 tablets (50mg ) at bedtime if needed for elevated blood pressure or HR.  Patient not taking: Reported on 01/18/2024   [provider]  Active Self, Pharmacy Records           Med Note (COFFELL, JON HERO   Thu Dec 16, 2023  1:24 PM) Patient confirms taking 2 strengths of metoprolol  succinate. Usual directions of 50mg  (1/2 tablet of 100mg )  QAM + 25mg  QPM (but may increase to 50mg  QPM if needed)  metoprolol  succinate (TOPROL -XL) 25 MG 24 hr tablet [id number]  Take 1 tablet (25 mg total) by mouth daily.  Patient not taking: Reported on 01/18/2024   Vann, Jessica U, DO  Active   Multiple Vitamin (MULTIVITAMIN WITH MINERALS) TABS tablet [id number] Yes Take 1 tablet by mouth daily. [provider]  Active Self, Pharmacy Records  nitroGLYCERIN  (NITROSTAT ) 0.4 MG SL tablet [id number] Yes Place 0.4 mg under the tongue every 5 (five) minutes x 3 doses as needed for chest pain. [provider]  Active Self, Pharmacy Records           Med Note LAURALYN, TIFFANI S   Wed Feb 26, 2016  9:33 AM)    omeprazole (PRILOSEC) 20 MG capsule [id number] Yes Take 20 mg by mouth daily. [provider]  Active Self, Pharmacy Records           Med Note (COFFELL, JON HERO   Thu Dec 16, 2023  1:10 PM)    oxyCODONE -acetaminophen  (PERCOCET) 10-325 MG tablet [id number] Yes Take 1 tablet by mouth every 8 (eight) hours as needed (severey pain). [provider]  Active Pharmacy Records, Self           Med Note (COFFELL, JON HERO   Thu Dec 16, 2023  1:05 PM) [facility]10/[facility] #90.  polyethylene glycol (MIRALAX  / GLYCOLAX ) 17 g packet [id number] Yes Take 17 g by mouth daily as needed for mild constipation. Verdene Purchase, MD  Active Pharmacy Records, Self  potassium chloride  (K-DUR,KLOR-CON ) 10 MEQ tablet [id number]  Take 10 mEq by mouth daily.  Patient not taking: Reported on 01/18/2024   [provider]  Active Self, Pharmacy Records  primidone  (MYSOLINE ) 50 MG tablet [id number]  Yes Take 150 mg by mouth daily.  Patient taking differently: Take 50 mg by mouth daily.   [provider]  Active Self, Pharmacy Records           Med Note (COFFELL, JON HERO   Thu Dec 16, 2023  1:06 PM) [facility] 10/30/23 #270.  sacubitril -valsartan  (ENTRESTO ) 24-26 MG [id number] Yes Take 1 tablet by mouth 2 (two) times daily. [provider]   Active Pharmacy Records, Self           Med Note (COFFELL, JON HERO   Thu Dec 16, 2023  1:07 PM) [facility] 09/17/23 #180.  Semaglutide , 2 MG/DOSE, (OZEMPIC , 2 MG/DOSE,) 8 MG/3ML SOPN [id number] Yes Inject 2 mg into the skin once a week   Active Self, Pharmacy Records  tamsulosin  (FLOMAX ) 0.4 MG CAPS capsule [id number] Yes Take 0.8 mg by mouth every evening. [provider]  Active Self, Pharmacy Records           Med Note (COFFELL, JON HERO   Thu Dec 16, 2023  1:07 PM) [facility] 11/12/23 #180.  traZODone  (DESYREL ) 150 MG tablet [id number] Yes Take 150 mg by mouth every evening. [provider]  Active Pharmacy Records, Self           Med Note (COFFELL, JON HERO   Thu Dec 16, 2023  1:08 PM) [facility] :  [facility] 11/24/23 #90.  Vibegron (GEMTESA) 75 MG TABS [id number] Yes Take 75 mg by mouth daily. [provider]  Active Pharmacy Records, Self           Med Note (COFFELL, JON HERO   Thu Dec 16, 2023  1:09 PM) [facility] - next fill scheduled 02/01/24 #90.            Goals Addressed             This Visit's Progress    COMPLETED: [facility] Transitions of Care (TOC) Care Plan       Problems:  Recent Hospitalization for treatment of Septic Shock Knowledge Deficit Related to Septic Shock/UTI and Pneumonia  Goal:  Over the next 30 days, the patient will not experience hospital readmission  Interventions:  Transitions of Care: 01/27/24 Patient reports doing good.  Several medications remain on hold for now. Weight 210 lbs.  Blood sugar 227.        Doctor Visits  - discussed the importance of doctor visits Reviewed Signs and symptoms of infection Monitor for fever, chills, increased urination, burning on urination, blood in urine, nausea, vomiting, or pain in abdomen  Contact physician if: You have a new or higher fever. You are coughing more deeply or more often. You are having increased shortness of breath.   Patient Self Care Activities:  Attend all scheduled provider  appointments Call provider office for new concerns or questions  Notify RN Care Manager of TOC call rescheduling needs Participate in Transition of Care Program/Attend TOC scheduled calls Take medications as prescribed   Monitor for fever, chills., increased cough, increased shortness of breath, burning on urination, nausea, vomiting, or pain in abdomen  Plan:  The patient has been provided with contact information for the care management team and has been advised to call with any health related questions or concerns.  Patient has met goal of TOC program. Closing to TOC.          Recommendation:   Continue Current Plan of Care  Follow Up Plan:   Closing From:  Transitions of Care Program.  Patient has met goal of TOC program.  Declines further follow up .    Irineo Gaulin J. Ritisha Deitrick RN, MSN [facility] RN Care Manager Direct Dial: [phone]  Fax: [phone] Website: [email or web address]
